# Patient Record
Sex: Male | Born: 1945
Health system: Southern US, Community
[De-identification: ages and names within clinical notes are randomized; demographics above are authoritative.]

## PROBLEM LIST (undated history)

## (undated) DIAGNOSIS — C801 Malignant (primary) neoplasm, unspecified: Secondary | ICD-10-CM

## (undated) DIAGNOSIS — T7840XA Allergy, unspecified, initial encounter: Secondary | ICD-10-CM

## (undated) DIAGNOSIS — R0600 Dyspnea, unspecified: Secondary | ICD-10-CM

## (undated) DIAGNOSIS — E119 Type 2 diabetes mellitus without complications: Secondary | ICD-10-CM

## (undated) DIAGNOSIS — I6529 Occlusion and stenosis of unspecified carotid artery: Secondary | ICD-10-CM

## (undated) DIAGNOSIS — J189 Pneumonia, unspecified organism: Secondary | ICD-10-CM

## (undated) DIAGNOSIS — Z5189 Encounter for other specified aftercare: Secondary | ICD-10-CM

## (undated) DIAGNOSIS — J45909 Unspecified asthma, uncomplicated: Secondary | ICD-10-CM

## (undated) DIAGNOSIS — IMO0002 Reserved for concepts with insufficient information to code with codable children: Secondary | ICD-10-CM

## (undated) DIAGNOSIS — I10 Essential (primary) hypertension: Secondary | ICD-10-CM

## (undated) DIAGNOSIS — E785 Hyperlipidemia, unspecified: Secondary | ICD-10-CM

## (undated) DIAGNOSIS — E66813 Obesity, class 3: Secondary | ICD-10-CM

## (undated) DIAGNOSIS — M1990 Unspecified osteoarthritis, unspecified site: Secondary | ICD-10-CM

## (undated) HISTORY — DX: Encounter for other specified aftercare: Z51.89

## (undated) HISTORY — DX: Hyperlipidemia, unspecified: E78.5

## (undated) HISTORY — PX: WRIST SURGERY: SHX841

## (undated) HISTORY — DX: Unspecified osteoarthritis, unspecified site: M19.90

## (undated) HISTORY — PX: TOTAL KNEE ARTHROPLASTY: SHX125

## (undated) HISTORY — PX: RIB FRACTURE SURGERY: SHX2358

## (undated) HISTORY — DX: Obesity, class 3: E66.813

## (undated) HISTORY — PX: KNEE SURGERY: SHX244

## (undated) HISTORY — DX: Allergy, unspecified, initial encounter: T78.40XA

## (undated) HISTORY — DX: Type 2 diabetes mellitus without complications: E11.9

## (undated) HISTORY — DX: Reserved for concepts with insufficient information to code with codable children: IMO0002

## (undated) HISTORY — PX: COLON SURGERY: SHX602

## (undated) HISTORY — PX: COLONOSCOPY: SHX174

## (undated) HISTORY — DX: Essential (primary) hypertension: I10

## (undated) HISTORY — DX: Unspecified asthma, uncomplicated: J45.909

## (undated) HISTORY — DX: Occlusion and stenosis of unspecified carotid artery: I65.29

## (undated) HISTORY — DX: Morbid (severe) obesity due to excess calories: E66.01

---

## 1998-11-15 HISTORY — PX: OTHER SURGICAL HISTORY: SHX169

## 1998-11-26 ENCOUNTER — Ambulatory Visit (HOSPITAL_COMMUNITY): Admission: RE | Admit: 1998-11-26 | Discharge: 1998-11-26 | Payer: Self-pay | Admitting: Gastroenterology

## 1999-06-18 ENCOUNTER — Encounter: Payer: Self-pay | Admitting: Surgery

## 1999-06-18 ENCOUNTER — Inpatient Hospital Stay (HOSPITAL_COMMUNITY): Admission: EM | Admit: 1999-06-18 | Discharge: 1999-07-04 | Payer: Self-pay

## 1999-06-20 ENCOUNTER — Encounter: Payer: Self-pay | Admitting: General Surgery

## 1999-06-21 ENCOUNTER — Encounter: Payer: Self-pay | Admitting: General Surgery

## 1999-06-22 ENCOUNTER — Encounter: Payer: Self-pay | Admitting: General Surgery

## 1999-06-22 ENCOUNTER — Encounter: Payer: Self-pay | Admitting: Thoracic Surgery

## 1999-06-23 ENCOUNTER — Encounter: Payer: Self-pay | Admitting: General Surgery

## 1999-06-24 ENCOUNTER — Encounter: Payer: Self-pay | Admitting: General Surgery

## 1999-06-25 ENCOUNTER — Encounter: Payer: Self-pay | Admitting: Thoracic Surgery

## 1999-06-26 ENCOUNTER — Encounter: Payer: Self-pay | Admitting: Thoracic Surgery

## 1999-06-29 ENCOUNTER — Encounter: Payer: Self-pay | Admitting: Thoracic Surgery

## 1999-06-30 ENCOUNTER — Encounter: Payer: Self-pay | Admitting: Thoracic Surgery

## 1999-07-01 ENCOUNTER — Encounter: Payer: Self-pay | Admitting: Thoracic Surgery

## 1999-08-10 ENCOUNTER — Encounter: Payer: Self-pay | Admitting: Orthopedic Surgery

## 1999-08-12 ENCOUNTER — Inpatient Hospital Stay (HOSPITAL_COMMUNITY): Admission: RE | Admit: 1999-08-12 | Discharge: 1999-08-14 | Payer: Self-pay | Admitting: Orthopedic Surgery

## 1999-08-13 ENCOUNTER — Encounter: Payer: Self-pay | Admitting: Orthopedic Surgery

## 1999-09-14 ENCOUNTER — Encounter: Payer: Self-pay | Admitting: Cardiothoracic Surgery

## 1999-09-14 ENCOUNTER — Encounter: Admission: RE | Admit: 1999-09-14 | Discharge: 1999-09-14 | Payer: Self-pay | Admitting: Cardiothoracic Surgery

## 1999-12-08 ENCOUNTER — Encounter: Payer: Self-pay | Admitting: Thoracic Surgery

## 1999-12-08 ENCOUNTER — Encounter: Admission: RE | Admit: 1999-12-08 | Discharge: 1999-12-08 | Payer: Self-pay | Admitting: Thoracic Surgery

## 2000-01-18 ENCOUNTER — Encounter: Payer: Self-pay | Admitting: Orthopedic Surgery

## 2000-01-20 ENCOUNTER — Encounter: Payer: Self-pay | Admitting: Orthopedic Surgery

## 2000-01-20 ENCOUNTER — Inpatient Hospital Stay (HOSPITAL_COMMUNITY): Admission: RE | Admit: 2000-01-20 | Discharge: 2000-01-22 | Payer: Self-pay | Admitting: Orthopedic Surgery

## 2000-04-08 ENCOUNTER — Encounter: Payer: Self-pay | Admitting: Thoracic Surgery

## 2000-04-08 ENCOUNTER — Encounter: Admission: RE | Admit: 2000-04-08 | Discharge: 2000-04-08 | Payer: Self-pay | Admitting: Thoracic Surgery

## 2000-05-12 ENCOUNTER — Ambulatory Visit (HOSPITAL_COMMUNITY): Admission: RE | Admit: 2000-05-12 | Discharge: 2000-05-12 | Payer: Self-pay | Admitting: Orthopedic Surgery

## 2000-05-12 ENCOUNTER — Encounter: Payer: Self-pay | Admitting: Orthopedic Surgery

## 2000-05-17 ENCOUNTER — Ambulatory Visit (HOSPITAL_COMMUNITY): Admission: RE | Admit: 2000-05-17 | Discharge: 2000-05-17 | Payer: Self-pay | Admitting: Orthopedic Surgery

## 2000-05-17 ENCOUNTER — Encounter: Payer: Self-pay | Admitting: Orthopedic Surgery

## 2000-05-17 ENCOUNTER — Encounter (INDEPENDENT_AMBULATORY_CARE_PROVIDER_SITE_OTHER): Payer: Self-pay | Admitting: *Deleted

## 2000-08-04 ENCOUNTER — Encounter: Payer: Self-pay | Admitting: Thoracic Surgery

## 2000-08-04 ENCOUNTER — Encounter: Admission: RE | Admit: 2000-08-04 | Discharge: 2000-08-04 | Payer: Self-pay | Admitting: Thoracic Surgery

## 2000-08-15 HISTORY — PX: OTHER SURGICAL HISTORY: SHX169

## 2001-03-03 ENCOUNTER — Encounter: Payer: Self-pay | Admitting: Thoracic Surgery

## 2001-03-03 ENCOUNTER — Encounter: Admission: RE | Admit: 2001-03-03 | Discharge: 2001-03-03 | Payer: Self-pay | Admitting: Thoracic Surgery

## 2001-07-20 ENCOUNTER — Ambulatory Visit (HOSPITAL_COMMUNITY): Admission: RE | Admit: 2001-07-20 | Discharge: 2001-07-20 | Payer: Self-pay | Admitting: Orthopedic Surgery

## 2001-07-20 ENCOUNTER — Encounter: Payer: Self-pay | Admitting: Orthopedic Surgery

## 2001-07-26 ENCOUNTER — Encounter: Payer: Self-pay | Admitting: Orthopedic Surgery

## 2001-07-26 ENCOUNTER — Inpatient Hospital Stay (HOSPITAL_COMMUNITY): Admission: RE | Admit: 2001-07-26 | Discharge: 2001-07-28 | Payer: Self-pay | Admitting: Orthopedic Surgery

## 2001-08-30 ENCOUNTER — Encounter: Payer: Self-pay | Admitting: Thoracic Surgery

## 2001-08-30 ENCOUNTER — Encounter: Admission: RE | Admit: 2001-08-30 | Discharge: 2001-08-30 | Payer: Self-pay | Admitting: Thoracic Surgery

## 2002-01-24 ENCOUNTER — Encounter: Admission: RE | Admit: 2002-01-24 | Discharge: 2002-01-24 | Payer: Self-pay | Admitting: Thoracic Surgery

## 2002-01-24 ENCOUNTER — Encounter: Payer: Self-pay | Admitting: Thoracic Surgery

## 2002-02-06 ENCOUNTER — Ambulatory Visit (HOSPITAL_COMMUNITY): Admission: RE | Admit: 2002-02-06 | Discharge: 2002-02-06 | Payer: Self-pay | Admitting: Orthopedic Surgery

## 2002-08-29 ENCOUNTER — Encounter: Payer: Self-pay | Admitting: Thoracic Surgery

## 2002-08-29 ENCOUNTER — Encounter: Admission: RE | Admit: 2002-08-29 | Discharge: 2002-08-29 | Payer: Self-pay | Admitting: Thoracic Surgery

## 2003-02-28 ENCOUNTER — Encounter: Payer: Self-pay | Admitting: Thoracic Surgery

## 2003-02-28 ENCOUNTER — Encounter: Admission: RE | Admit: 2003-02-28 | Discharge: 2003-02-28 | Payer: Self-pay | Admitting: Thoracic Surgery

## 2003-05-16 ENCOUNTER — Emergency Department (HOSPITAL_COMMUNITY): Admission: EM | Admit: 2003-05-16 | Discharge: 2003-05-16 | Payer: Self-pay | Admitting: Emergency Medicine

## 2003-05-16 ENCOUNTER — Encounter: Payer: Self-pay | Admitting: Emergency Medicine

## 2003-08-27 ENCOUNTER — Encounter: Admission: RE | Admit: 2003-08-27 | Discharge: 2003-08-27 | Payer: Self-pay | Admitting: Thoracic Surgery

## 2003-08-27 ENCOUNTER — Encounter: Payer: Self-pay | Admitting: Thoracic Surgery

## 2004-02-25 ENCOUNTER — Encounter: Admission: RE | Admit: 2004-02-25 | Discharge: 2004-02-25 | Payer: Self-pay | Admitting: Thoracic Surgery

## 2004-04-01 ENCOUNTER — Ambulatory Visit (HOSPITAL_COMMUNITY): Admission: RE | Admit: 2004-04-01 | Discharge: 2004-04-01 | Payer: Self-pay | Admitting: Gastroenterology

## 2005-03-03 ENCOUNTER — Encounter: Admission: RE | Admit: 2005-03-03 | Discharge: 2005-03-03 | Payer: Self-pay | Admitting: Thoracic Surgery

## 2005-06-03 ENCOUNTER — Emergency Department (HOSPITAL_COMMUNITY): Admission: EM | Admit: 2005-06-03 | Discharge: 2005-06-03 | Payer: Self-pay | Admitting: Emergency Medicine

## 2005-08-31 ENCOUNTER — Ambulatory Visit (HOSPITAL_COMMUNITY): Admission: RE | Admit: 2005-08-31 | Discharge: 2005-08-31 | Payer: Self-pay | Admitting: Orthopedic Surgery

## 2005-09-07 ENCOUNTER — Observation Stay (HOSPITAL_COMMUNITY): Admission: AD | Admit: 2005-09-07 | Discharge: 2005-09-08 | Payer: Self-pay | Admitting: Orthopedic Surgery

## 2005-11-05 ENCOUNTER — Encounter: Admission: RE | Admit: 2005-11-05 | Discharge: 2005-11-05 | Payer: Self-pay | Admitting: Orthopedic Surgery

## 2005-12-17 ENCOUNTER — Encounter: Admission: RE | Admit: 2005-12-17 | Discharge: 2005-12-17 | Payer: Self-pay | Admitting: Orthopedic Surgery

## 2006-01-05 ENCOUNTER — Encounter: Admission: RE | Admit: 2006-01-05 | Discharge: 2006-01-05 | Payer: Self-pay | Admitting: Orthopedic Surgery

## 2006-02-02 ENCOUNTER — Encounter: Admission: RE | Admit: 2006-02-02 | Discharge: 2006-02-02 | Payer: Self-pay | Admitting: Orthopedic Surgery

## 2006-02-22 ENCOUNTER — Encounter: Admission: RE | Admit: 2006-02-22 | Discharge: 2006-02-22 | Payer: Self-pay | Admitting: Thoracic Surgery

## 2006-03-16 ENCOUNTER — Encounter: Admission: RE | Admit: 2006-03-16 | Discharge: 2006-03-16 | Payer: Self-pay | Admitting: Orthopedic Surgery

## 2006-04-15 ENCOUNTER — Encounter: Admission: RE | Admit: 2006-04-15 | Discharge: 2006-04-15 | Payer: Self-pay | Admitting: Orthopedic Surgery

## 2006-05-19 ENCOUNTER — Observation Stay (HOSPITAL_COMMUNITY): Admission: RE | Admit: 2006-05-19 | Discharge: 2006-05-21 | Payer: Self-pay | Admitting: Orthopedic Surgery

## 2006-11-18 ENCOUNTER — Encounter: Admission: RE | Admit: 2006-11-18 | Discharge: 2006-11-18 | Payer: Self-pay | Admitting: Orthopedic Surgery

## 2007-03-10 ENCOUNTER — Encounter: Admission: RE | Admit: 2007-03-10 | Discharge: 2007-03-10 | Payer: Self-pay | Admitting: Orthopedic Surgery

## 2007-11-13 ENCOUNTER — Inpatient Hospital Stay (HOSPITAL_COMMUNITY): Admission: RE | Admit: 2007-11-13 | Discharge: 2007-11-16 | Payer: Self-pay | Admitting: Orthopedic Surgery

## 2010-09-07 HISTORY — PX: TRANSTHORACIC ECHOCARDIOGRAM: SHX275

## 2010-12-08 LAB — HEPATIC FUNCTION PANEL
Indirect Bilirubin: 0.5 mg/dL (ref 0.3–0.9)
Total Protein: 6.8 g/dL (ref 6.0–8.3)

## 2010-12-08 LAB — BASIC METABOLIC PANEL
BUN: 12 mg/dL (ref 6–23)
CO2: 26 mEq/L (ref 19–32)
GFR calc Af Amer: >60 mL/min (ref >60)
Glucose, Bld: 112 mg/dL — ABNORMAL HIGH (ref 70–99)
Potassium: 4.8 mEq/L (ref 3.5–5.1)
Sodium: 142 mEq/L (ref 135–145)

## 2010-12-08 LAB — HEMOGLOBIN A1C: Hgb A1c MFr Bld: 6.3 % — ABNORMAL HIGH (ref <5.7)

## 2010-12-08 LAB — LIPID PANEL: Total CHOL/HDL Ratio: 4.1 RATIO

## 2010-12-09 ENCOUNTER — Ambulatory Visit
Admission: RE | Admit: 2010-12-09 | Discharge: 2010-12-09 | Payer: Self-pay | Source: Home / Self Care | Attending: Surgery | Admitting: Surgery

## 2010-12-11 NOTE — Op Note (Signed)
  NAMEKUNAL, [name]             ACCOUNT NO.:  [id number]  MEDICAL RECORD NO.:  [id number]          PATIENT TYPE:  AMB  LOCATION:  DSC                          FACILITY:  [facility]  PHYSICIAN:  Thornton Park. Daphine Deutscher, MD  DATE OF BIRTH:  August 13, 1946  DATE OF PROCEDURE:  12/09/2010 DATE OF DISCHARGE:                              OPERATIVE REPORT   PREOPERATIVE DIAGNOSIS:  A 65 year old African American male with a thrice recurrent pilonidal cyst/abscess.  PROCEDURE:  Pilonidal cystectomy with primary closure.  SURGEON:  Thornton Park. Daphine Deutscher, MD  ANESTHESIA:  General endotracheal in the prone position.  DESCRIPTION OF PROCEDURE:  Mr. Glinski was taken to room 3 at [facility] on December 09, 2010 and given general anesthesia and placed in prone position.  His buttock cheeks were pulled apart.  There was previous punctum that I could see in the midline.  When I expressed the left side, I could get some cloudy fluid coming up through that punctum. I excised the punctum with an ellipse that included the area on the right side, so it was somewhat of an eccentrically oriented ellipse, but I went down deep and took this down of ultimately about an inch and half deep removing all the grossly involved tissue.  I looked for foreign bodies and did not really see any, but I saw a lot of granulation tissue along this pilonidal sinus.  Once this was removed, bleeding was controlled with electrocautery.  I closed it in layers with deep layers of 2-0 Vicryl, working my way up to near the subcutaneous tissue and then closed the skin with vertical mattress sutures of 3-0 Prolene. These will stay in for probably 2 weeks.  The area was then injected with 10 mL of 0.25% Marcaine with epinephrine and was painted with Betadine ointment.  I also irrigated the cavity with Betadine and saline prior to closing it.  The patient seemed to tolerate the procedure well. He will be given some Roxicet elixir or  Roxicet pills to take for pain and he will be instructed to return to the office in about 2 weeks.     Thornton Park Daphine Deutscher, MD     MBM/MEDQ  D:  12/09/2010  T:  12/10/2010  Job:  [id number]  cc:   Brett Canales A. Cleta Alberts, M.D.  Electronically Signed by Luretha Murphy MD on 12/11/2010 08:30:53 PM

## 2011-03-30 NOTE — Op Note (Signed)
NAMEJAISON, PETRAGLIA             ACCOUNT NO.:  [id number]   MEDICAL RECORD NO.:  [id number]          PATIENT TYPE:  INP   LOCATION:  1619                         FACILITY:  [facility]   PHYSICIAN:  Madlyn Frankel. Charlann Boxer, M.D.  DATE OF BIRTH:  1945-11-23   DATE OF PROCEDURE:  11/13/2007  DATE OF DISCHARGE:                               OPERATIVE REPORT   PREOPERATIVE DIAGNOSIS:  Left knee end-stage osteoarthritis.   POSTOPERATIVE DIAGNOSIS:  Left knee end-stage osteoarthritis.   PROCEDURE:  Left total knee replacement.   COMPONENTS USED:  DePuy rotating platform knee system with a size 4  femur, a 4 tibia, a 10 mm insert, and a 41 patellar button.   SURGEON:  Madlyn Frankel. Charlann Boxer, M.D.   ASSISTANT:  Yetta Glassman. Loreta Ave, PA   ANESTHESIA:  General   COMPLICATIONS:  None.   DRAINS:  Times one.   BLOOD LOSS:  150 mL.   TOURNIQUET TIME:  60 minutes at 250 mmHg.   INDICATIONS FOR PROCEDURE:  The Mr. Brining is a 65 year old gentleman  who presented to the office for evaluation of left knee pain.  Radiographs revealed end-stage degenerative changes.  He failed  conservative measures and wished to discuss surgical intervention.  Risks and benefits were discussed including risk of infection, DVT, the  postop course expectations, consent was obtained for a left total knee  replacement.   PROCEDURE IN DETAIL:  The patient was brought to operative theater.  Once adequate anesthesia preoperative antibiotics, Ancef, were  administered; the patient was positioned supine with a left proximal  thigh tourniquet placed.  The left lower extremity was prescrubbed, then  prepped and draped in sterile fashion.  A midline incision was made  followed by a median parapatellar arthrotomy with patella subluxation  rather than eversion.   Following the initial debridement, attention was directed the patella.  Precut measurement was all way up to 28 mm.  I dissected down to 15 mm  and used the 41 patellar button.   A metal shim was placed to protect the  cut surface of the patella from retractors.  At this point, attention  was directed to  the femur.  The femoral canal was opened with a reamer,  and irrigated to prevent fat emboli.  Intramedullary rod was then  passed, and at 5 degrees valgus 10 mm of bone was resected off the  distal femur.   I then sized the femur to be a size 4, the anterior, posterior, and  chamfer cuts were then made without difficulty or complication.  I then  made the trochlear box cut based off the lateral aspect of the distal  femur without issue.   Attention was now directed to the tibia.  With tibial subluxation,  further meniscectomies and was cruciate stumps debrided.  Then 10 mm of  bone resected off the proximal tibia based off the lateral side using  the stylus.  An extramedullary guide positioned in the center portion of  the leg with bony landmarks identified and palpated.   After this cut the knee was brought into extension with an extension  block,  and I was happy to note that the knee came out in extension.  I  did check and find that my cut was in a slight bit of varus still, so I  ended up cutting a little bit more bone off the lateral proximal tibia  and rechecked with the alignment rod and happier that the knee was now  more straight.   I finished off the final preparation of the tibia removing osteophytes  on the medial side of the tibia using a size 4 and lateralizing it.  I  drilled a keel punch, and did a trial reduction with a 4 femur, 4 tibia,  and a 10-mm insert.  The knee came out to full extension.  The patella  tracked without application pressure.  At this point, all trial  components were removed.  I drilled the sclerotic bone off the proximal  medial tibia, using several drill holes.  I then irrigated the wound,  cleaned it out as necessary.  I injected the synovial capsule layer was  60 mL of 1/4% Marcaine and epinephrine in 1 mL of  Toradol.  Final cement  was mixed, as components opened.  These components were then cemented  into position, and the knee brought into extension to allow the cement  to cure.   Once the cement had cured, excessive cement was debrided throughout the  knee.  There were no visualized cement fragments throughout.  At this  point the knee was irrigated, again, 5 mL of FloSeal was injected into  the posterior aspect of the knee.  Tourniquet was let down prior to  this.  The final 10-mm insert was in place without difficulty and the  knee reduced.  The knee was then brought into flexion, and with a  Hemovac drain placed deep.  The extensor mechanism was reapproximated  using #1 Vicryl.  The remaining wound was closed with 2-0 Vicryls, and a  running 4-0 Monocryl.  The patient tolerated the procedure well and was  brought to recovery room and extubated in stable condition with a  sterile bulky dressing on his knee.      Madlyn Frankel Charlann Boxer, M.D.  Electronically Signed     MDO/MEDQ  D:  11/13/2007  T:  11/14/2007  Job:  [id number]
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
## 2011-04-02 NOTE — Op Note (Signed)
[place]. [facility]  Patient:    [name], [name]                    MRN: [id number] Proc. Date: 05/17/00 Adm. Date:  [id number] Disc. Date: [id number] Attending:  Wende Mott                           Operative Report  PREOPERATIVE DIAGNOSIS:  Nonunion, right humerus.  Rule out osteomyelitis. Rule out avascular necrosis, left humerus.  POSTOPERATIVE DIAGNOSIS:  Nonunion, left humerus.  Rule out osteomyelitis. Rule out aseptic necrosis.  ANESTHESIA:  General.  OPERATION:  Craig needle biopsy to the left humerus and culture and sensitivity and Grams stain, left humerus.  DESCRIPTION OF PROCEDURE:  The patient was taken to the operating room after given adequate preop medication and given general anesthesia, and intubated. The left humeral area was scrubbed with Betadine scrub and painted with Betadine solution and draped in a sterile manner.  C-arm was used to visualize placement of the nonunion.  An incision was made over the lateral aspect of the humerus.  Sharp and blunt dissection was made down to the nonunion site. Craig needle was introduced and bony as well as soft tissue material was removed.  From the nonunion site, the bone itself appeared to be much harder than what appeared to be on x-ray even at the nonunion site.  The tissue was sent for culture and sensitivity and Grams stain was done as well as biopsy of the bony material for possible osteomyelitis or avascular bone.  After adequate tissue was obtained, irrigation with antibiotic solution was done. Wound closure was done with 4-0 nylon and a compressive dressings applied. The patient tolerated the procedure quite well and went to the recovery room in stable and satisfactory condition.  The patient is being discharged on Percocet one q.4h. p.r.n. for pain. Continue using his sling and ice packs at home and to return to the office in one week. DD:  06/10/00 TD:  06/12/00 Job:  [id number]

## 2011-04-02 NOTE — Op Note (Signed)
[place]  Patient:    [name], [name] Visit Number: [id number] MRN: [id number]          Service Type: SUR Location: [id number] Attending Physician:  Wende Mott Dictated by:   Kennieth Rad, M.D. Proc. Date: 07/26/01 Admit Date:  07/26/2001 Discharge Date: 07/28/2001                             Operative Report  PREOPERATIVE DIAGNOSIS:  Nonunion of left humerus.  POSTOPERATIVE DIAGNOSIS:  Nonunion of left humerus.  PROCEDURES:  Bone graft of the left iliac crest to left humerus and internal bone stimulator, EBI.  ANESTHESIA:  General.  SURGEON:  Kennieth Rad, M.D.  DESCRIPTION OF PROCEDURE:  The patient was taken to the operating room after being given adequate preoperative medications.  He was given general anesthesia and intubated.  The left upper extremity was prepped with DuraPrep and draped in sterile manner.  An anterolateral incision was made over the left humerus and mid-shaft area, avoiding the previous scar, going through the skin and subcutaneous tissues down to the biceps muscle tissue down to the nonunion site.  The plate on the humerus was found to be good and stable. Inspection of the fracture site revealed only a small section of the humerus that had not united with obvious defect.  Bone had formed laterally, posteriorly, and mostly medially.  There was an anteromedial section of the humerus which had not united.  This was curetted with the curet with removal of all of the fibrous tissue from the area.  After adequate debridement of the area, then an incision was made over the left iliac crest going through the skin and subcutaneous tissues.  Soft tissue was subperiosteally elevated from the iliac crest.  With the use of oscillating saw, bone graft was obtained with cancellus and cortical bone.  A bone plug was created.  Next an internal bone stimulator was packed into the nonunion site followed by  placement of the bone graft on top of it with the bone plug.  The electrical stimulator itself was placed superiorly underneath the subcutaneous tissue over the deltoid where he had an old previous anterior shoulder scar.  This was then sutured in place with the use of 0 Vicryl between the fascia of the muscle and the subcutaneous tissue.  After impaction of the bone graft, it was found to be good and stable.  Wound closure was then done with 0 Vicryl for the fascia, 0 Vicryl for the subcutaneous, and skin staples for the skin.  The left iliac crest wound also was closed with 0 Vicryl and 2-0 Vicryl for the subcutaneous and skin staples.  A compressive dressing was applied to both areas.  The patient tolerated the procedure quite well and went to the recovery room in stable and satisfactory condition. Dictated by:   Kennieth Rad, M.D. Attending Physician:  Wende Mott DD:  08/23/01 TD:  08/23/01 Job: [id number]
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
## 2011-04-02 NOTE — Discharge Summary (Signed)
NAMEAKIVA, BRASSFIELD             ACCOUNT NO.:  [id number]   MEDICAL RECORD NO.:  [id number]          PATIENT TYPE:  INP   LOCATION:  1619                         FACILITY:  [facility]   PHYSICIAN:  Madlyn Frankel. Charlann Boxer, M.D.  DATE OF BIRTH:  Mar 10, 1946   DATE OF ADMISSION:  11/13/2007  DATE OF DISCHARGE:  11/16/2007                               DISCHARGE SUMMARY   ADMITTING DIAGNOSIS:  1. Osteoarthritis.  2. Asthma.  3. Hypertension.   DISCHARGE DIAGNOSIS:  1. Osteoarthritis.  2. Asthma.  3. Hypertension.   HISTORY OF PRESENT ILLNESS:  A 65 year old male with a history of left  knee pain secondary to osteoarthritis.  Refractory to all conservative  treatment.   CONSULTATION:  None.   PROCEDURE:  Left total knee arthroplasty by surgeon Dr. Durene Romans,  assistant Dwyane Luo PA-C.   LABS:  Preadmission CBC, hematocrit was 46.1, at discharge stable at 30.  White cell differential no significant abnormalities.  Coags, INR was  0.9.  Routine chemistry preop,  sodium 141, potassium 4.7, glucose 104.  At discharge, sodium 138, potassium 4.3, glucose 127.  Kidney function,  GFR greater than 68 at admission, calcium 9.7. At time of discharge, GFR  greater than 60 and calcium 8.2.  GI showed his AST at 30, all others  normal.  UA was negative.   RADIOLOGY:  Chest two-view no active disease.   CARDIOLOGY:  EKG normal sinus rhythm.   HOSPITAL COURSE:  The patient underwent left total knee replacement,  admitted to the orthopedic floor.  He progressed nicely during his  course of stay and the stay was unremarkable.  He remained afebrile.  He  did have a drop in his blood although it was stable at the time of  discharge.  Dressing was changed on a daily basis after postop day #1.  The wound had no significant drainage at any time. He was  neurovascularly intact to his left lower extremity throughout and was  able to do a straight leg raise as of day one and continued to be so  throughout  his course.  His calves remained soft and nontender.  He was  weightbearing as tolerated with the use of a rolling walker.  We  discontinued a PCA, hep-locked his IV. On the second day, he ambulated  300 feet and by day three he was stable and was ready for home health  care PT.   DISCHARGE DISPOSITION:  Discharged home with home health care PT and  Lovenox teaching in stable and improved condition.   DISCHARGE PHYSICAL THERAPY:  The goals of physical therapy will be  weightbearing as tolerated with the use of a rolling walker.  Range of  motion goals 0-100 at 2 weeks, 1-120 at 6 weeks. Want to work on quad  strengthening and stretching exercise.   DISCHARGE MEDICATIONS:  1. Vicodin 5/325 1-2 p.o. q.4-6 p.r.n. pain.  2. Lovenox 40 mg subcu q.24 x11 days.  3. Robaxin 500 mg p.o. q.6.  4. Iron 325 mg p.o. t.i.d. x2 weeks.  5. Colace 100 mg p.o. b.i.d.  6. MiraLax 17 grams p.o.  daily.   HOME MEDICATIONS:  1. Bisoprolol HCTZ 10/6.25 one p.o. daily.  2. Multivitamin one p.o. daily.  3. Garlique one p.o. daily.  4. Proventil inhaler 1 puff p.r.n.  5. Celebrex 200 mg one p.o. b.i.d. x2 weeks.   DISCHARGE FOLLOWUP:  Follow up with Dr. Charlann Boxer at phone number [phone] in  2 weeks.   DISCHARGE DIET:  Regular as tolerated.   DISCHARGE WOUND CARE:  Keep wound dry. Change dressing on a daily basis.     ______________________________  Yetta Glassman [address], [place]      Madlyn Frankel. Charlann Boxer, M.D.  Electronically Signed    BLM/MEDQ  D:  12/14/2007  T:  12/14/2007  Job:  [id number]   cc:   Jonita Albee, M.D.  Fax: [phone]   Stan Head. Cleta Alberts, M.D.  Fax: [phone]

## 2011-04-02 NOTE — Op Note (Signed)
NAMEARJUN, HARD NO.:  [id number]   MEDICAL RECORD NO.:  [id number]          PATIENT TYPE:  INP   LOCATION:  5157                         FACILITY:  [facility]   PHYSICIAN:  Myrtie Neither, MD      DATE OF BIRTH:  06-18-1946   DATE OF PROCEDURE:  DATE OF DISCHARGE:  09/08/2005                                 OPERATIVE REPORT   PREOPERATIVE DIAGNOSES:  1.  Refracture, left humerus.  2.  Broken hardware, left humerus.   POSTOPERATIVE DIAGNOSES:  1.  Refracture, left humerus.  2.  Broken hardware, left humerus.   OPERATION PERFORMED:  1.  Removal of hardware, left humerus.  2.  Open reduction and internal fixation, left humerus, with the use of      surfacing bone grafting.   SURGEON:  Myrtie Neither, M.D.   ANESTHESIA:  General.   DESCRIPTION OF THE OPERATION:  The patient was brought to the operating room  after he was given adequate premedication he was given general anesthesia  and intubated.  The patient was placed in the barber chair position.  The  left upper extremity was prepped with DuraPrep and draped in a sterile  manner.  Bovie was used for hemostasis.   A longitudinal incision laterally was made going through an old previous  scar, going through the skin and subcutaneous tissue down to the fascia.  The soft tissue was excised sharp and bluntly, and dissected from the  humerus.  I was getting down to the fracture site.  The previous plate and  screws were completely incorporated with bone.  Disheveled bone was removed  with the use of an osteotome.  The screws were removed; there was two  proximal screws, but the distal one left in.  After removal of the screws  and plate the ends at the fracture site were __________  and roughened up,  and reaming down the canal both ends of the fracture site .  Next the cement  for the bone grafting was mixed and bone plug was placed into both ends of  the fracture ends.   Both proximal and distal ends were  approximated with the use of a  compression plate and screws.  Multiple screws were placed across it holding  it in a stable anatomic position.  Further bone graft was then placed around  the exterior of the bone at the fracture site.  Hemostasis was obtained with  the use of the Bovie.  Closure was then done with 0-Vicryl for the fascia, 2-  0 for the subcutaneous  and skin staples for the skin.   ESTIMATED BLOOD LOSS:  Approximately 700 mL of blood was lost.   A compressive dressing was applied.  A Jones splint was applied.   The patient tolerated the procedure quite well and went to the recovery room  in stable and satisfactory condition.      Myrtie Neither, MD  Electronically Signed     AC/MEDQ  D:  10/15/2005  T:  10/17/2005  Job:  [id number]
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
## 2011-04-02 NOTE — Op Note (Signed)
[name]. [facility]  Patient:    [name], [name] Visit Number: [id number] MRN: [id number]          Service Type: DSU Location: [facility] 2899 29 Attending Physician:  Wende Mott Dictated by:   Kennieth Rad, M.D. Proc. Date: 02/06/02 Admit Date:  02/06/2002 Discharge Date: 02/06/2002                             Operative Report  PREOPERATIVE DIAGNOSIS:  Electrode to internal electrical bone stimulation, left arm.  POSTOPERATIVE DIAGNOSIS:  Electrode to internal electrical bone stimulation, left arm.  PROCEDURES:  Removal of electrode, left arm.  SURGEON:  Kennieth Rad, M.D.  ANESTHESIA:  General.  DESCRIPTION OF PROCEDURE:  The patient was taken to the operating room after being given adequate preoperative medications.  Given general anesthesia and intubated.  The left arm was prepped with DuraPrep and draped in a sterile manner.  A skin incision was made over the old previous scar, which is where the electrode was placed.  The electrode was identified and completely removed from the fracture site.  Copious irrigation was done.  Wound closure was then done with skin staples.  A pressure dressing was applied.  The patient tolerated the procedure quite well and went to the recovery room in stable and satisfactory condition.  The patient is being discharged home on Percocet one q.4h. p.r.n. for pain, ice packs, and elevation.  He is to return to the office in one week. Dictated by:   Kennieth Rad, M.D. Attending Physician:  Wende Mott DD:  03/14/02 TD:  03/14/02 Job: [id number]

## 2011-04-02 NOTE — H&P (Signed)
[name]. [facility]  Patient:    [name], RUDNICK Visit Number: [id number] MRN: [id number]          Service Type: SUR Location: 5000 5021 01 Attending Physician:  Wende Mott Dictated by:   Kennieth Rad, M.D. Admit Date:  07/26/2001 Discharge Date: 07/28/2001                           History and Physical  CHIEF COMPLAINT:  Nonunion of left humerus.  HISTORY OF PRESENT ILLNESS:  This is a 65 year old male who had undergone ORIF of severely open wound involving his left humerus.  The patient has developed nonunion.  The patient had a CT scan demonstrating the section of the humerus which has not united.  The patient had been undergoing external stone stimulation with no improvement in the last few months.  PAST MEDICAL HISTORY:  High blood pressure, asthma, stomach ulcers, hepatitis, multiple operations of the left humerus and left hand for post-traumatic injury, and left knee surgery in 1998.  ALLERGIES:  None.  HABITS:  None.  MEDICATIONS:  Fosamax, Ultracet, Vioxx, Flonase, multivitamins, calcium with vitamin D, inhaler, Bi-Flex over-the-counter, and Ziac 5/6.25 mg.  FAMILY HISTORY:  Noncontributory.  REVIEW OF SYSTEMS:  Some left knee pain.  No cardiac, respiratory, urinary, or bowel symptoms.  PHYSICAL EXAMINATION:  Temperature 97.8 degrees, pulse 68, respirations 16, blood pressure 110/70.  HEIGHT:  68 inches.  WEIGHT:  247 pounds.  HEENT:  Normocephalic and atraumatic.  Eyes:  Conjunctivae and sclerae clear.  NECK:  Supple.  CHEST:  Clear.  CARDIAC:  S1 and S2 regular.  EXTREMITIES:  The left arm has an old surgical scar and post-traumatic scar with loss of muscle mass involving the left biceps and triceps.  Limited range of motion of the left elbow, as well as that of the left hand and wrist.  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  IMPRESSION:   Nonunion of left humerus. Dictated by:   Kennieth Rad, M.D. Attending Physician:  Wende Mott DD:  08/23/01 TD:  08/23/01 Job: [id number]

## 2011-04-02 NOTE — Op Note (Signed)
[name], STANEK NO.:  [id number]   MEDICAL RECORD NO.:  [id number]          PATIENT TYPE:  INP   LOCATION:  5157                         FACILITY:  [facility]   PHYSICIAN:  Myrtie Neither, MD      DATE OF BIRTH:  Oct 04, 1946   DATE OF PROCEDURE:  09/23/2005  DATE OF DISCHARGE:  09/08/2005                                 OPERATIVE REPORT   PREOPERATIVE DIAGNOSIS:  Refracture of left humerus with broken hardware,  screw and plates, left humerus.   POSTOPERATIVE DIAGNOSIS:  Refracture of left humerus with broken hardware,  screw and plates, left humerus.   ANESTHESIA:  General.   PROCEDURE:  Removal of hardware left humerus, ORIF left humerus, with use of  Symphony bone grafting to the left humerus.   DESCRIPTION OF PROCEDURE:  The patient was taken to the operating room and  after given adequate preoperative medication, given general anesthesia and  intubated.  The patient was placed in barber chair position.  The left upper  extremity was prepped with DuraPrep and draped in a sterile manner.  Bovie  used for hemostasis.  C-Arm was used to visualize fracture reduction.  Lateral incision was made over the left humerus going through the old  previous surgical scar.  Sharp and blunt dissection was made down to the  humerus.  Soft tissues were subperiosteally elevated from the humerus.  Initially plate and screws were not visible due to bone had grown completely  encompassing plate and screws and had encompassed the fracture site.  With  an osteotome, bone over the plate area was removed.  Screws were identified  and removed.  Two of the broken screws proximal end was removed and distal  ends were left in the bone.  These two screws were in the proximal fragment  of the fracture.  After removal of the plate and debridement about the  fascia site.  A reamer was used and drill was used down both proximally and  distally at the fracture site creating good bony bleeding  about the area.  Next, Symphony bone grafting material was mixed and was packed down both  canals of the humerus.  Next, the fracture site was then reduced.  With a  compression plate, with locking and some nonlocking screws in good position  and good bone to bone contact.  Further bone grafting material was packed  around the fracture site.  Hemostasis obtained.  Wound closure was then done  with 0 Vicryl for the fascia, 2-0 for the subcutaneous, and skin staples for  the skin.  Big bulky compressive dressing was applied.  Mobilizing sling was  applied.  The patient tolerated the procedure quite well and went to the  recovery room in stable and satisfactory condition.      Myrtie Neither, MD  Electronically Signed     AC/MEDQ  D:  09/23/2005  T:  09/23/2005  Job:  [id number]

## 2011-04-02 NOTE — H&P (Signed)
[name], DINI NO.:  [id number]   MEDICAL RECORD NO.:  [id number]          PATIENT TYPE:  INP   LOCATION:  5157                         FACILITY:  [facility]   PHYSICIAN:  Myrtie Neither, MD      DATE OF BIRTH:  14-Mar-1946   DATE OF ADMISSION:  09/06/2005  DATE OF DISCHARGE:  09/08/2005                                HISTORY & PHYSICAL   CHIEF COMPLAINT:  Painful left arm.   HISTORY OF PRESENT ILLNESS:  This is a 65 year old male who had been treated  in the past for nonunion, fracture of his left humerus, and angulation type  wound to his left arm.  The patient had been doing quite well with fracture  healing of his left humerus and had returned to work as a [employer].  The  patient is using the left arm only to assist himself.  The patient 2 weeks  ago developed severe pain in the left arm with swelling and weakness.  The  patient was found to have refracture of the left humerus with bending and  fracturing of several of the screws.   PAST MEDICAL HISTORY:  High blood pressure, multiple operations of the left  humerus with debridements and ORIF, electrical bone stimulators, bone  grafting.  Left hand surgery with tendon transfers.  Left chest surgery.  Multiple rib fracture with plate fixation and stabilization.  History of  asthma.   ALLERGIES:  No known drug allergies.   MEDICATIONS:  1.  Lisinopril hydrochlorothiazide 20/12.5 1/2 tablet daily.  2.  Percocet q.6 hours p.r.n.  3.  Mobic 50 mg daily.  4.  Albuterol spray p.r.n.  5.  Multivitamin.  6.  Bioflex.   FAMILY HISTORY:  Noncontributory.   SOCIAL HISTORY:  No history of use of alcohol or tobacco.   REVIEW OF SYSTEMS:  No cardiac or respiratory, no urinary or bowel symptoms.   PHYSICAL EXAMINATION:  VITAL SIGNS:  Temperature 98, pulse 80, respirations  16, blood pressure 140/71.  Height 5 feet 8 inches, weight 242.  HEENT:  Head normocephalic and atraumatic.  Eyes; conjunctivae and  sclerae  clear.  NECK:  Supple.  CHEST:  Clear.  HEART:  S1 and S2 regular.  EXTREMITIES:  Left arm humeral area old surgical scars, tenderness, swollen  with slight angulation.  The patient has limited use of the left upper  extremity and hand.  This is from old previous injuries and surgery.   X-ray reveals refracture of the left humerus with fracture of the screws and  dislodgement of the plate and screws left humerus.   IMPRESSION:  Refracture of left humerus with dislodgement and fracture of  the screws left humerus.   PLAN:  Removal of hardware, ORIF left humerus, and bone graft using Symphony  bone grafting.      Myrtie Neither, MD  Electronically Signed     AC/MEDQ  D:  09/23/2005  T:  09/23/2005  Job:  [id number]

## 2011-04-02 NOTE — Discharge Summary (Signed)
[place]. [facility]  Patient:    [name], [name]                    MRN: [id number] Adm. Date:  [id number] Disc. Date: [id number] Attending:  Wende Mott                           Discharge Summary  ADMITTING DIAGNOSES:  1. Nonunion of left humerus.  2. Hardware of left humerus.  3. Possible rotator cuff tear, left shoulder.  DISCHARGE DIAGNOSES:  1. Nonunion of left humerus.  2. Hardware of left humerus.  3. Chronic rotator cuff disease of left shoulder.  COMPLICATIONS: None.  OPERATION/PROCEDURE:  1. Open reduction and internal fixation of nonunion of left humerus with bone     grafting, done on January 20, 2000.  2. Removal of intramedullary rod and screws of left humerus, done on January 20, 2000.  3. Exploration of left rotator cuff, done on January 20, 2000.  HISTORY OF PRESENT ILLNESS: The patient is a 65 year old male who has had open pressure wound to the left arm and shoulder.  The patient had IM nailing of the left humerus which underwent nonunion and limited shoulder activity doing physical therapy.  PHYSICAL EXAMINATION: Pertinent of left shoulder limited abduction and forward extension.  False movement at the fracture site.  HOSPITAL COURSE: The patient underwent surgery on January 20, 2000 and had ORIF of nonunion of the left humerus and bone grafting and exploration of the left rotator cuff.  Postoperatively his hospital course was fairly benign.  The patient was kept on IV antibiotics 24 hours and IM pain medication.  The patients pain came under control and he was able to be switched over to p.o. Percocet 10 tablets and was able to be discharged.  DISCHARGE MEDICATIONS: Percocet 10.  DISCHARGE INSTRUCTIONS: Continue ice packs and use of shoulder immobilizer. Return to the office in one week.  DISCHARGE CONDITION: Stable and satisfactory.DD:  02/17/00 TD:  02/18/00 Job: [id number]
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
## 2011-04-02 NOTE — Op Note (Signed)
[place]. [facility]  Patient:    [name], [name]                    MRN: [id number] Proc. Date: 01/20/00 Adm. Date:  [id number] Disc. Date: [id number] Attending:  Wende Mott                           Operative Report  PREOPERATIVE DIAGNOSES:  1. Intramedullary rod, left humerus, with several screws.  2. Nonunion, left humerus.  3. Possible rotator cuff tear, left shoulder.  POSTOPERATIVE DIAGNOSES:  1. Nonunion of left humerus.  2. Intramedullary rod and screws of left humerus.  3. Chronic degenerated rotator cuff disease, left shoulder.  ANESTHESIA: General.  OPERATION/PROCEDURE:  1. Removal of intramedullary rod and three screws of left humerus.  2. Open reduction and internal fixation of left humerus with compression plate.  3. Bone grafting, left humerus.  4. Exploration of left shoulder rotator cuff.  SURGEON:  Kennieth Rad, M.D.  DESCRIPTION OF PROCEDURE: The patient was taken to the operating room after given - given general anesthesia and intubated.  The patient was placed in the barber  chair position and the left shoulder and arm prepped with DuraPrep and draped in a sterile manner.  The Bovie was used for hemostasis.  A shoulder incision was made going through the previous scar at the shoulder strap over the left shoulder, going through the skin and subcutaneous tissue.  The deltoid was reflected off the acromion and inspection of the shoulder revealed old degenerated rotator cuff disease.  The humerus itself had subluxed proximally and trapped up against the  acromion.  Soft tissue was too retracted and was too degenerated to do any repair. Identification of the proximal end of the IM nail was made and the IM nail was removed.  The cross-fixing screws initially were removed from the nail, which allowed it to be distracted.  A separate incision was made at the fracture site  distally going through previous old  scar, down through the subcutaneous tissue nd muscle tissue with periosteal elevator from the fracture site and nonunion site  identified.  Fibrous tissue was completely resected down to good bone.  The ends were curetted quite well.  A nine hole compression plate was applied across the  fracture site and the humerus reduced.  Eight screws were placed, stabilizing the fracture site.  Bone graft was applied to the area.  Irrigation with antibiotic  solution was done.  Wound closure was then done with 0 Vicryl for the fascia and muscle fascial tissue, 2-0 for the subcutaneous, and skin staples for the skin.  The same wound closure was done for the shoulder with reapproximation of the deltoid with 0 Vicryl, 2-0 Vicryl for the subcutaneous, and skin staples for the skin.  A compressive dressing and bulky dressings were applied to both areas. he patient was placed in a shoulder immobilizer.  The patient tolerated the procedure quite well and went to the recovery room in stable and satisfactory condition. DD:  02/17/00 TD:  02/18/00 Job: [id number]

## 2011-04-02 NOTE — Op Note (Signed)
NAME:  [name], [name]                       ACCOUNT NO.:  [id number]   MEDICAL RECORD NO.:  [id number]                   PATIENT TYPE:  AMB   LOCATION:  ENDO                                 FACILITY[facility]  [facility]   PHYSICIAN:  John C. Madilyn Fireman, M.D.                 DATE OF BIRTH:  [birth year]   DATE OF PROCEDURE:  04/01/2004  DATE OF DISCHARGE:                                 OPERATIVE REPORT   INDICATION FOR PROCEDURE:  History of adenomatous colon polyps, last  colonoscopy five years ago.   PROCEDURE:  The patient was placed in the left lateral decubitus position  and placed on a pulse monitor with continuous low flow oxygen delivered by  nasal cannula.  He was sedated with 62.5 mcg IV fentanyl and 5 mg IV Versed.  An Olympus video colonoscope was inserted into the rectum and advanced to  the cecum, confirmed by transillumination of McBurney's point and  visualization of the ileocecal valve and appendiceal orifice.  The prep was  excellent.  The cecum, ascending, transverse, descending and sigmoid colon  all appeared normal with no masses, polyps, diverticula or other mucosal  abnormalities.  The rectum likewise appeared normal.  On retroflexed view  the anus revealed no obvious internal hemorrhoids.  The scope was then  withdrawn and the patient returned to the recovery room in stable condition.  He tolerated the procedure well and there were no immediate complications.   IMPRESSION:  Normal study.   PLAN:  Repeat study in five years.                                               John C. Madilyn Fireman, M.D.    JCH/MEDQ  D:  04/01/2004  T:  04/01/2004  Job:  [id number]   cc:   Jonita Albee, M.D.  [facility]  [address]  [place]  [place] [address]  Fax: [phone]

## 2011-04-02 NOTE — Discharge Summary (Signed)
[name], HUPFER NO.:  [id number]   MEDICAL RECORD NO.:  [id number]          PATIENT TYPE:  INP   LOCATION:  5157                         FACILITY:  [facility]   PHYSICIAN:  Myrtie Neither, MD      DATE OF BIRTH:  [birth year]   DATE OF ADMISSION:  09/06/2005  DATE OF DISCHARGE:  09/08/2005                                 DISCHARGE SUMMARY   ADMISSION DIAGNOSIS:  Refracture of left humerus with broken hardware, high  blood pressure.   DISCHARGE DIAGNOSIS:  Refracture of left humerus with broken hardware, high  blood pressure.   COMPLICATIONS:  None.   INFECTIONS:  None.   PROCEDURE:  Removal of hardware left humerus and ORIF left humerus with bone  grafting.  Application of longarm cast.   HISTORY OF PRESENT ILLNESS:  This is a 65 year old male with previous left  humeral fracture with ORIF and bone grafting and had done quite well.  Had  returned to work using the left arm to assist.  The patient developed  refracture of the left humerus.  The patient was found to have refracture of  the humerus with broken screw and dislodgement of the plate.   PERTINENT PHYSICAL:  Left humerus tender and swollen with deformities.  Skin  is intact.   HOSPITAL COURSE:  The patient had underwent preoperative laboratory, CBC,  UA, EKG, chest x-ray which was found to be stable enough for the patient to  undergo surgery.  The patient underwent removal of hardware and ORIF with  bone graft of the left humerus.  Tolerated the procedure quite well.  Postoperative course is fairly benign.  The patient received preoperative  and postoperative IV antibiotics.  Ice packs, Percocet 10/650 q.4 hours  p.r.n. for pain.  The patient is instructed in using spirometry and was able  to be discharged home with use of long arm cast, Percocet q.4 hours p.r.n.  for pain, and return to the office in 1 week.  The patient was discharged in  stable and satisfactory condition.      Myrtie Neither,  MD  Electronically Signed     AC/MEDQ  D:  09/23/2005  T:  09/23/2005  Job:  [id number]
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
## 2011-08-20 LAB — CBC
HCT: 30 — ABNORMAL LOW
HCT: 36.8 — ABNORMAL LOW
Hemoglobin: 12.8 — ABNORMAL LOW
MCV: 86.6
MCV: 86.9
Platelets: 113 — ABNORMAL LOW
Platelets: 151
RDW: 14
RDW: 14.1
RDW: 14.2
WBC: 8.3

## 2011-08-20 LAB — DIFFERENTIAL
Eosinophils Absolute: 0.2
Eosinophils Relative: 5
Neutro Abs: 1.7

## 2011-08-20 LAB — ABO/RH: ABO/RH(D): O POS

## 2011-08-20 LAB — TYPE AND SCREEN

## 2011-08-20 LAB — BASIC METABOLIC PANEL
BUN: 11
BUN: 13
BUN: 9
CO2: 29
CO2: 29
Calcium: 8.2 — ABNORMAL LOW
Calcium: 9.7
Chloride: 106
Chloride: 106
Creatinine, Ser: 0.99
Creatinine, Ser: 1.17
GFR calc Af Amer: >60
GFR calc Af Amer: >60
GFR calc non Af Amer: >60
GFR calc non Af Amer: >60
Glucose, Bld: 104 — ABNORMAL HIGH
Glucose, Bld: 127 — ABNORMAL HIGH
Glucose, Bld: 129 — ABNORMAL HIGH
Potassium: 4.3
Potassium: 4.7
Potassium: 4.8
Sodium: 141

## 2011-08-20 LAB — URINALYSIS, ROUTINE W REFLEX MICROSCOPIC
Ketones, ur: NEGATIVE
Specific Gravity, Urine: 1.01

## 2011-08-20 LAB — HEPATIC FUNCTION PANEL
AST: 38 — ABNORMAL HIGH
Alkaline Phosphatase: 78
Total Bilirubin: 1.1

## 2011-08-20 LAB — PROTIME-INR
INR: 0.9
Prothrombin Time: 12.7

## 2011-08-20 LAB — APTT: aPTT: 29

## 2011-10-26 ENCOUNTER — Encounter (INDEPENDENT_AMBULATORY_CARE_PROVIDER_SITE_OTHER): Payer: 59 | Admitting: Emergency Medicine

## 2011-10-26 ENCOUNTER — Other Ambulatory Visit: Payer: Self-pay | Admitting: Emergency Medicine

## 2011-10-26 DIAGNOSIS — Z0000 Encounter for general adult medical examination without abnormal findings: Secondary | ICD-10-CM

## 2011-10-26 DIAGNOSIS — I10 Essential (primary) hypertension: Secondary | ICD-10-CM

## 2011-10-26 DIAGNOSIS — E789 Disorder of lipoprotein metabolism, unspecified: Secondary | ICD-10-CM

## 2011-10-26 DIAGNOSIS — E119 Type 2 diabetes mellitus without complications: Secondary | ICD-10-CM

## 2011-10-27 ENCOUNTER — Encounter: Payer: Self-pay | Admitting: Family Medicine

## 2011-10-27 DIAGNOSIS — I10 Essential (primary) hypertension: Secondary | ICD-10-CM | POA: Insufficient documentation

## 2011-10-27 DIAGNOSIS — E785 Hyperlipidemia, unspecified: Secondary | ICD-10-CM | POA: Insufficient documentation

## 2011-10-27 DIAGNOSIS — N281 Cyst of kidney, acquired: Secondary | ICD-10-CM

## 2011-10-27 DIAGNOSIS — R739 Hyperglycemia, unspecified: Secondary | ICD-10-CM

## 2011-10-27 DIAGNOSIS — Z0271 Encounter for disability determination: Secondary | ICD-10-CM

## 2011-10-27 DIAGNOSIS — M1990 Unspecified osteoarthritis, unspecified site: Secondary | ICD-10-CM

## 2011-11-02 ENCOUNTER — Ambulatory Visit (INDEPENDENT_AMBULATORY_CARE_PROVIDER_SITE_OTHER): Payer: 59

## 2011-11-02 DIAGNOSIS — R51 Headache: Secondary | ICD-10-CM

## 2011-11-02 DIAGNOSIS — J069 Acute upper respiratory infection, unspecified: Secondary | ICD-10-CM

## 2011-11-04 ENCOUNTER — Ambulatory Visit (INDEPENDENT_AMBULATORY_CARE_PROVIDER_SITE_OTHER): Payer: 59

## 2011-11-04 DIAGNOSIS — G4484 Primary exertional headache: Secondary | ICD-10-CM

## 2011-11-04 DIAGNOSIS — Q828 Other specified congenital malformations of skin: Secondary | ICD-10-CM

## 2011-12-05 ENCOUNTER — Ambulatory Visit (INDEPENDENT_AMBULATORY_CARE_PROVIDER_SITE_OTHER): Payer: 59

## 2011-12-05 DIAGNOSIS — R05 Cough: Secondary | ICD-10-CM

## 2011-12-05 DIAGNOSIS — R0602 Shortness of breath: Secondary | ICD-10-CM

## 2011-12-05 DIAGNOSIS — J189 Pneumonia, unspecified organism: Secondary | ICD-10-CM

## 2011-12-13 ENCOUNTER — Ambulatory Visit (INDEPENDENT_AMBULATORY_CARE_PROVIDER_SITE_OTHER): Payer: 59

## 2011-12-13 DIAGNOSIS — J9801 Acute bronchospasm: Secondary | ICD-10-CM

## 2011-12-13 DIAGNOSIS — N281 Cyst of kidney, acquired: Secondary | ICD-10-CM

## 2011-12-16 ENCOUNTER — Other Ambulatory Visit: Payer: Self-pay | Admitting: Emergency Medicine

## 2011-12-16 DIAGNOSIS — N281 Cyst of kidney, acquired: Secondary | ICD-10-CM

## 2011-12-22 ENCOUNTER — Other Ambulatory Visit: Payer: Self-pay | Admitting: Emergency Medicine

## 2011-12-22 ENCOUNTER — Ambulatory Visit
Admission: RE | Admit: 2011-12-22 | Discharge: 2011-12-22 | Disposition: A | Discharge status: Home / Self Care | Payer: 59 | Source: Ambulatory Visit | Attending: Emergency Medicine | Admitting: Emergency Medicine

## 2011-12-22 DIAGNOSIS — N281 Cyst of kidney, acquired: Secondary | ICD-10-CM

## 2011-12-22 MED ORDER — IOHEXOL 300 MG/ML  SOLN
125.0000 mL | Freq: Once | INTRAMUSCULAR | Status: AC | PRN
  Administered 2011-12-22: 125 mL via INTRAVENOUS

## 2011-12-23 ENCOUNTER — Other Ambulatory Visit: Payer: 59

## 2012-01-24 ENCOUNTER — Other Ambulatory Visit: Payer: Self-pay | Admitting: Emergency Medicine

## 2012-01-25 ENCOUNTER — Ambulatory Visit: Payer: 59 | Admitting: Emergency Medicine

## 2012-01-26 ENCOUNTER — Telehealth: Payer: Self-pay

## 2012-01-26 MED ORDER — METFORMIN HCL 500 MG PO TABS: ORAL_TABLET | ORAL | Status: DC

## 2012-01-26 NOTE — Telephone Encounter (Signed)
.  [facility]  PT STATES HE IS OUT OF HIS METFORMIN AND NEED TO HAVE SOME CALLED IN PLEASE CALL [phone]  [facility] ON [address]

## 2012-01-26 NOTE — Telephone Encounter (Signed)
Pt has an appt on 02/04/12.  Advised pt rx was sent in.

## 2012-02-04 ENCOUNTER — Ambulatory Visit (INDEPENDENT_AMBULATORY_CARE_PROVIDER_SITE_OTHER): Payer: 59 | Admitting: Emergency Medicine

## 2012-02-04 DIAGNOSIS — E119 Type 2 diabetes mellitus without complications: Secondary | ICD-10-CM

## 2012-02-04 DIAGNOSIS — E785 Hyperlipidemia, unspecified: Secondary | ICD-10-CM

## 2012-02-04 DIAGNOSIS — I10 Essential (primary) hypertension: Secondary | ICD-10-CM

## 2012-02-04 DIAGNOSIS — E782 Mixed hyperlipidemia: Secondary | ICD-10-CM

## 2012-02-04 MED ORDER — SIMVASTATIN 40 MG PO TABS: 40.0000 mg | ORAL_TABLET | Freq: Every day | ORAL | Status: DC

## 2012-02-04 MED ORDER — BISOPROLOL-HYDROCHLOROTHIAZIDE 10-6.25 MG PO TABS: 1.0000 | ORAL_TABLET | Freq: Two times a day (BID) | ORAL | Status: DC

## 2012-02-04 MED ORDER — METFORMIN HCL 500 MG PO TABS: ORAL_TABLET | ORAL | Status: DC

## 2012-02-04 NOTE — Progress Notes (Signed)
  Subjective:    Patient ID: [name], male    DOB: January 27, 1946, 66 y.o.   MRN: [id number]  HPI patient had a followup diabetes high blood high cholesterol. He is doing well at home he has no specific problems at the present time he's had no chest pain shortness of breath or any other symptoms. He has not any problems with his feet tissue breakdown or numbness.    Review of Systems noncontributory except as relates to the present    Objective:   Physical Exam  HENT:  Head: Normocephalic.  Eyes: Pupils are equal, round, and reactive to light.  Neck: No JVD present. No tracheal deviation present. No thyromegaly present.  Cardiovascular: Normal rate and regular rhythm.  Exam reveals no gallop and no friction rub.   No murmur heard. Pulmonary/Chest: No stridor. No respiratory distress. He has no wheezes. He has no rales. He exhibits no tenderness.  Musculoskeletal:       He has had significant damage to his left arm he has a dislocated proximal clavicle.  Lymphadenopathy:    He has no cervical adenopathy.          Assessment & Plan:

## 2012-04-18 ENCOUNTER — Encounter: Payer: Self-pay | Admitting: Emergency Medicine

## 2012-05-30 ENCOUNTER — Encounter: Payer: Self-pay | Admitting: Emergency Medicine

## 2012-05-30 ENCOUNTER — Ambulatory Visit (INDEPENDENT_AMBULATORY_CARE_PROVIDER_SITE_OTHER): Payer: 59 | Admitting: Emergency Medicine

## 2012-05-30 VITALS — BP 123/73 | HR 62 | Temp 97.8°F | Resp 16 | Ht 68.0 in | Wt 260.6 lb

## 2012-05-30 DIAGNOSIS — E785 Hyperlipidemia, unspecified: Secondary | ICD-10-CM

## 2012-05-30 DIAGNOSIS — I10 Essential (primary) hypertension: Secondary | ICD-10-CM

## 2012-05-30 DIAGNOSIS — E119 Type 2 diabetes mellitus without complications: Secondary | ICD-10-CM

## 2012-05-30 LAB — COMPREHENSIVE METABOLIC PANEL
ALT: 32 U/L (ref 0–53)
AST: 30 U/L (ref 0–37)
Alkaline Phosphatase: 81 U/L (ref 39–117)
Sodium: 141 mEq/L (ref 135–145)
Total Bilirubin: 0.5 mg/dL (ref 0.3–1.2)
Total Protein: 6.9 g/dL (ref 6.0–8.3)

## 2012-05-30 LAB — LIPID PANEL
HDL: 28 mg/dL — ABNORMAL LOW (ref >39)
LDL Cholesterol: 45 mg/dL (ref 0–99)
Total CHOL/HDL Ratio: 3.6 Ratio
VLDL: 27 mg/dL (ref 0–40)

## 2012-05-30 MED ORDER — SIMVASTATIN 40 MG PO TABS: 40.0000 mg | ORAL_TABLET | Freq: Every day | ORAL | Status: DC

## 2012-05-30 MED ORDER — BISOPROLOL-HYDROCHLOROTHIAZIDE 10-6.25 MG PO TABS: 1.0000 | ORAL_TABLET | Freq: Two times a day (BID) | ORAL | Status: DC

## 2012-05-30 MED ORDER — METFORMIN HCL 500 MG PO TABS: ORAL_TABLET | ORAL | Status: DC

## 2012-05-30 NOTE — Progress Notes (Signed)
  Subjective:    Patient ID: [name], male    DOB: [birth year], 66 y.o.   MRN: [id number]  HPI patient in for followup he states he feels well. He recently had his visit to Dr. Herbie Baltimore and everything was good. He's been trying to watch his diet and taking his medication and regular.    Review of Systems     Objective:   Physical Exam  Constitutional: He appears well-developed and well-nourished.  Eyes: Pupils are equal, round, and reactive to light.  Neck: No thyromegaly present.  Cardiovascular: Normal rate and regular rhythm.   Pulmonary/Chest: Effort normal and breath sounds normal.  Musculoskeletal:       Examination of the feet reveal 2+ pulses normal sensation skin looks intact   Results for orders placed in visit on 02/04/12  GLUCOSE, POCT (MANUAL RESULT ENTRY)      Component Value Range   POC Glucose 138    POCT GLYCOSYLATED HEMOGLOBIN (HGB A1C)      Component Value Range   Hemoglobin A1C 5.9     Results for orders placed in visit on 05/30/12  GLUCOSE, POCT (MANUAL RESULT ENTRY)      Component Value Range   POC Glucose 119 (*) 70 - 99 mg/dl   Results for orders placed in visit on 05/30/12  GLUCOSE, POCT (MANUAL RESULT ENTRY)      Component Value Range   POC Glucose 119 (*) 70 - 99 mg/dl  POCT GLYCOSYLATED HEMOGLOBIN (HGB A1C)      Component Value Range   Hemoglobin A1C 6.0          Assessment & Plan:  All medical issues seems to be stable at present. We'll check routine labs and make sure Dr. Herbie Baltimore has a copy of these. No change in medications at present .

## 2012-06-08 ENCOUNTER — Telehealth: Payer: Self-pay

## 2012-06-08 NOTE — Telephone Encounter (Signed)
PT WOULD LIKE TO SPEAK WITH DR DAUB'S NURSE. HAD SPOKEN WITH HER LAST WEEK AND FORGOT TO ASK ANOTHER QUESTION PLEASE CALL [phone]

## 2012-06-11 NOTE — Telephone Encounter (Signed)
Dr Cleta Alberts will you review this please.

## 2012-06-11 NOTE — Telephone Encounter (Signed)
PT WOULD LIKE TO GET AN INVERSION TABLE FOR BACK AND SINCE HE HAS HIGH BLOOD PRESSURE IT SAYS TO ASK YOUR DR BEFORE USING IT.  IS IT OK TO USE IT?

## 2012-06-13 NOTE — Telephone Encounter (Signed)
Called to advise inversion table not a good idea, with his medical problems, he will think about the Chiropractor.

## 2012-06-13 NOTE — Telephone Encounter (Signed)
I would prefer he see a physical therapist and let them work with him regarding his back. there is a good chiropractor I can recommend to work with him for his back I do not think with his underlying medical problems an inversion table would not be a good idea

## 2012-06-20 ENCOUNTER — Encounter: Payer: Self-pay | Admitting: Emergency Medicine

## 2012-07-12 ENCOUNTER — Ambulatory Visit (INDEPENDENT_AMBULATORY_CARE_PROVIDER_SITE_OTHER): Payer: 59 | Admitting: Family Medicine

## 2012-07-12 VITALS — BP 130/80 | HR 68 | Temp 98.0°F | Resp 18 | Ht 68.0 in | Wt 264.0 lb

## 2012-07-12 DIAGNOSIS — M436 Torticollis: Secondary | ICD-10-CM

## 2012-07-12 DIAGNOSIS — J029 Acute pharyngitis, unspecified: Secondary | ICD-10-CM

## 2012-07-12 MED ORDER — AMOXICILLIN 875 MG PO TABS: 875.0000 mg | ORAL_TABLET | Freq: Two times a day (BID) | ORAL | Status: AC

## 2012-07-12 MED ORDER — DICLOFENAC SODIUM 75 MG PO TBEC: 75.0000 mg | DELAYED_RELEASE_TABLET | Freq: Two times a day (BID) | ORAL | Status: AC

## 2012-07-12 NOTE — Progress Notes (Signed)
@[facility] @   Patient ID: [name] MRN: [id number], DOB: [birth year], 66 y.o. Date of Encounter: 07/12/2012, 8:14 AM  Primary Physician: No primary provider on file.  Chief Complaint:  Chief Complaint  Patient presents with  . Sore Throat    2days  . Neck Pain    2 days    HPI: 66 y.o. year old male presents with day history of sore throat. Subjective fever and chills. No cough, congestion, rhinorrhea, sinus pressure, otalgia, or headache. Normal hearing. No GI complaints. Able to swallow saliva, but hurts to do so. Decreased appetite secondary to sore throat.  Also complains of a stiff neck, although is able to rotate over 30 degrees each way  No past medical history on file.   Home Meds: Prior to Admission medications   Medication Sig Start Date End Date Taking? Authorizing Provider  albuterol (PROVENTIL) (2.5 MG/3ML) 0.083% nebulizer solution Take 2.5 mg by nebulization every 6 (six) hours as needed.    Historical Provider, MD  aspirin 81 MG tablet Take 81 mg by mouth daily.    Historical Provider, MD  bisoprolol-hydrochlorothiazide [facility]) 10-6.25 MG per tablet Take 1 tablet by mouth 2 (two) times daily. 05/30/12   Collene Gobble, MD  metFORMIN (GLUCOPHAGE) 500 MG tablet Take 1 tablet twice daily with meals 05/30/12   Collene Gobble, MD  Misc Natural Products (OSTEO BI-FLEX TRIPLE STRENGTH) TABS Take by mouth.    Historical Provider, MD  Multiple Vitamin (MULTIVITAMIN) capsule Take 1 capsule by mouth daily.    Historical Provider, MD  simvastatin (ZOCOR) 40 MG tablet Take 1 tablet (40 mg total) by mouth at bedtime. 05/30/12   Collene Gobble, MD    Allergies: No Known Allergies  History   Social History  . Marital Status: Married    Spouse Name: N/A    Number of Children: N/A  . Years of Education: N/A   Occupational History  . Not on file.   Social History Main Topics  . Smoking status: Former Games developer  . Smokeless tobacco: Not on file  . Alcohol Use: Not on file   . Drug Use: Not on file  . Sexually Active: Not on file   Other Topics Concern  . Not on file   Social History Narrative  . No narrative on file     Review of Systems: Constitutional: negative for chills, fever, night sweats or weight changes HEENT: see above Cardiovascular: negative for chest pain or palpitations Respiratory: negative for hemoptysis, wheezing, or shortness of breath Abdominal: negative for abdominal pain, nausea, vomiting or diarrhea Dermatological: negative for rash Neurologic: negative for headache   Physical Exam: Blood pressure 130/80, pulse 68, temperature 98 F (36.7 C), temperature source Oral, resp. rate 18, height 5\' 8"  (1.727 m), weight 264 lb (119.75 kg), SpO2 96.00%., Body mass index is 40.14 kg/(m^2). General: Well developed, well nourished, in no acute distress. Head: Normocephalic, atraumatic, eyes without discharge, sclera non-icteric, nares are patent. Bilateral auditory canals clear, TM's are without perforation, pearly grey with reflective cone of light bilaterally. No sinus TTP. Oral cavity moist, dentition normal. Posterior pharynx with post nasal drip and mild erythema. No peritonsillar abscess or tonsillar exudate. Neck: Supple. No thyromegaly. Full ROM. No lymphadenopathy. Lungs: Clear bilaterally to auscultation without wheezes, rales, or rhonchi. Breathing is unlabored. Heart: RRR with S1 S2. No murmurs, rubs, or gallops appreciated. Abdomen: Soft, non-tender, non-distended with normoactive bowel sounds. No hepatomegaly. No rebound/guarding. No obvious abdominal masses. Msk:  Strength  and tone normal for age. Extremities: No clubbing or cyanosis. No edema. Neuro: Alert and oriented X 3. Moves all extremities spontaneously. CNII-XII grossly in tact. Psych:  Responds to questions appropriately with a normal affect.   Labs:   ASSESSMENT AND PLAN:  66 y.o. year old male with pharyngitis 1. Pharyngitis  Culture, Group A Strep,  amoxicillin (AMOXIL) 875 MG tablet  2. Torticollis  diclofenac (VOLTAREN) 75 MG EC tablet     - -Tylenol/Motrin prn -Rest/fluids -RTC precautions -RTC 3-5 days if no improvement  Signed, Elvina Sidle, MD 07/12/2012 8:14 AM

## 2012-07-14 LAB — CULTURE, GROUP A STREP: Organism ID, Bacteria: NORMAL

## 2012-10-03 ENCOUNTER — Encounter: Payer: Self-pay | Admitting: Emergency Medicine

## 2012-10-03 ENCOUNTER — Ambulatory Visit (INDEPENDENT_AMBULATORY_CARE_PROVIDER_SITE_OTHER): Payer: 59 | Admitting: Emergency Medicine

## 2012-10-03 VITALS — BP 125/68 | HR 60 | Temp 98.1°F | Resp 18 | Ht 68.0 in | Wt 268.0 lb

## 2012-10-03 DIAGNOSIS — E119 Type 2 diabetes mellitus without complications: Secondary | ICD-10-CM

## 2012-10-03 DIAGNOSIS — I10 Essential (primary) hypertension: Secondary | ICD-10-CM

## 2012-10-03 DIAGNOSIS — E785 Hyperlipidemia, unspecified: Secondary | ICD-10-CM

## 2012-10-03 DIAGNOSIS — Z23 Encounter for immunization: Secondary | ICD-10-CM

## 2012-10-03 LAB — LIPID PANEL
Cholesterol: 95 mg/dL (ref 0–200)
HDL: 26 mg/dL — ABNORMAL LOW (ref >39)
Triglycerides: 154 mg/dL — ABNORMAL HIGH (ref <150)
VLDL: 31 mg/dL (ref 0–40)

## 2012-10-03 NOTE — Progress Notes (Signed)
  Subjective:    Patient ID: [name], male    DOB: 05-07-1946, 66 y.o.   MRN: [id number]  HPI problem #1 diabetes. Patient has been following his diet closely. He's taking his Glucophage. He has no complaints of chest pain shortness of breath or other problems problem #2 high cholesterol. He's been following his diet taking his Zocor and no complaints regarding this. Problem #3 hypertension. He continues on Ziac for blood pressure control he's had no problems with this medication    Review of Systems     Objective:   Physical Exam patient is alert and cooperative in no distress. His neck is supple. He is a deformity of his left arm which is chronic. His chest is clear to auscultation and percussion. His cardiac exam reveals regular rate and rhythm without murmurs rubs or gallops. The abdomen is soft. Extremity exam reveals no diabetic changes sensation normal no ulceration seen.  Results for orders placed in visit on 10/03/12  GLUCOSE, POCT (MANUAL RESULT ENTRY)      Component Value Range   POC Glucose 121 (*) 70 - 99 mg/dl  POCT GLYCOSYLATED HEMOGLOBIN (HGB A1C)      Component Value Range   Hemoglobin A1C 6.0         Assessment & Plan:  Patient stable at present we'll schedule his next visit for a physical. He was given a flu shot today routine labs were drawn. Hemoglobin A1c is good at 6.0.

## 2012-10-11 ENCOUNTER — Telehealth: Payer: Self-pay

## 2012-10-11 NOTE — Telephone Encounter (Signed)
PT WOULD LIKE A REFILL OF HIS MEDICATIONS UNTIL HIS APPT. WITH DR. DAUB IN April. HE DOES NOT WANT TO HAVE TO COME IN AND PAY ANOTHER CO-PAY. PLEASE CALL. [phone]

## 2012-10-11 NOTE — Telephone Encounter (Signed)
He is advised he has refills, he states he looked at the bottles wrong.

## 2013-01-02 ENCOUNTER — Encounter: Payer: 59 | Admitting: Emergency Medicine

## 2013-02-19 ENCOUNTER — Telehealth: Payer: Self-pay

## 2013-02-19 MED ORDER — ALBUTEROL SULFATE HFA 108 (90 BASE) MCG/ACT IN AERS: 2.0000 | INHALATION_SPRAY | RESPIRATORY_TRACT | Status: DC | PRN

## 2013-02-19 NOTE — Telephone Encounter (Signed)
[facility] request refill on Ventolin inhaler.

## 2013-02-19 NOTE — Telephone Encounter (Signed)
Rx sent to pharmacy   

## 2013-02-26 IMAGING — CT CT ABDOMEN WO/W CM
4 of 9 series · 13 of 36 positions shown, 19 images · IV contrast (WATER)
Comparison: 06/09/2007

CLINICAL DATA: Evaluate renal cyst.

CT ABDOMEN WITHOUT AND WITH CONTRAST
TECHNIQUE: Multidetector CT imaging of the abdomen was performed
following the standard protocol before and during bolus
administration of intravenous contrast.
Contrast: 125mL OMNIPAQUE IOHEXOL 300 MG/ML IV SOLN

[Series 3: arterial/nephrographic · axial · arterial · 0.96mm/px · z∈[-201,-21]mm · 4 of 242 slices shown, 9 images]
[im 49/242  soft-tissue]
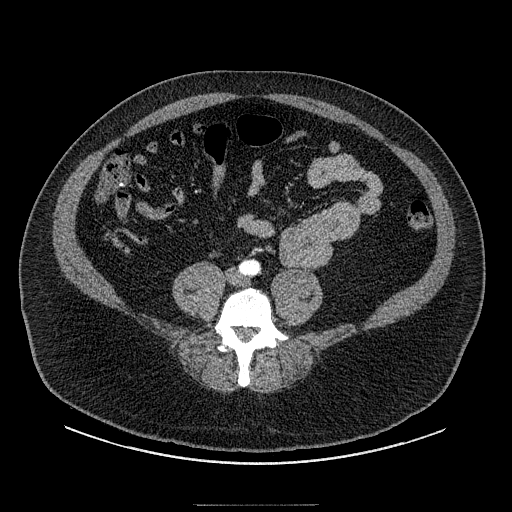
[im 49/242  lung]
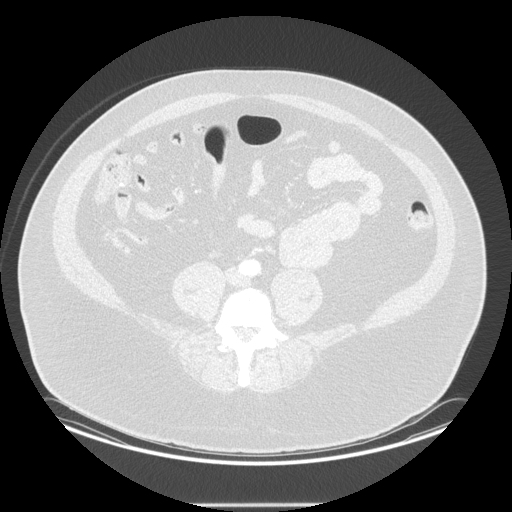
[im 49/242  bone]
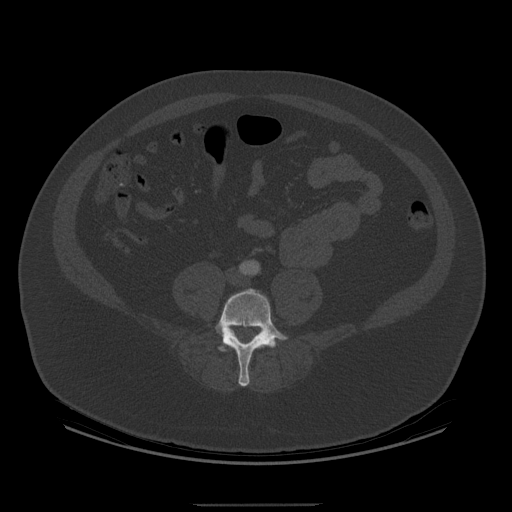
[im 97/242  soft-tissue]
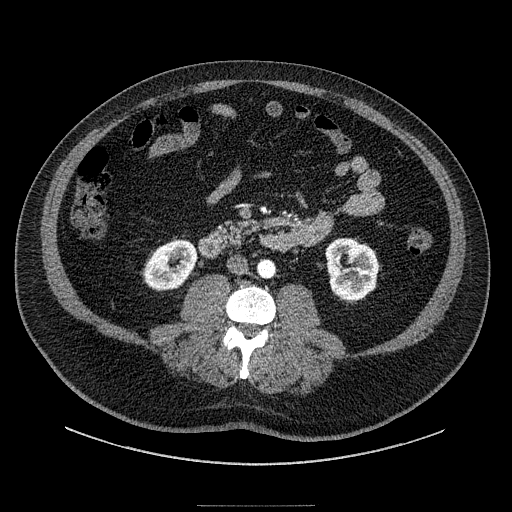
[im 97/242  lung]
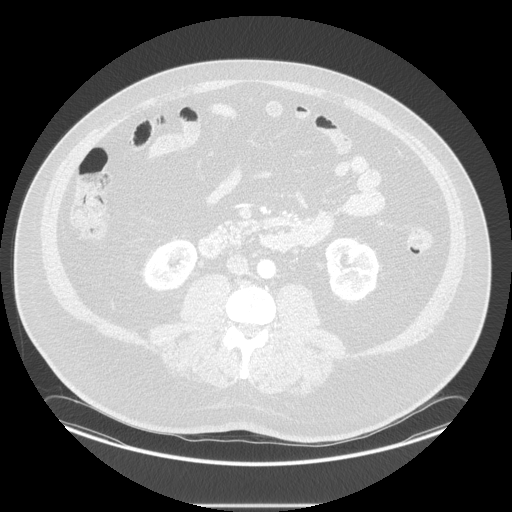
[im 145/242  soft-tissue]
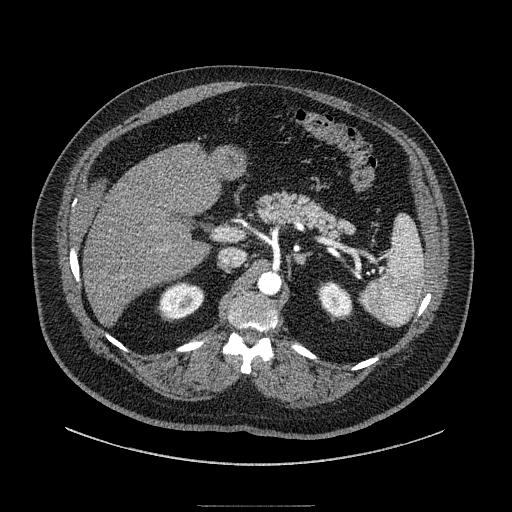
[im 145/242  lung]
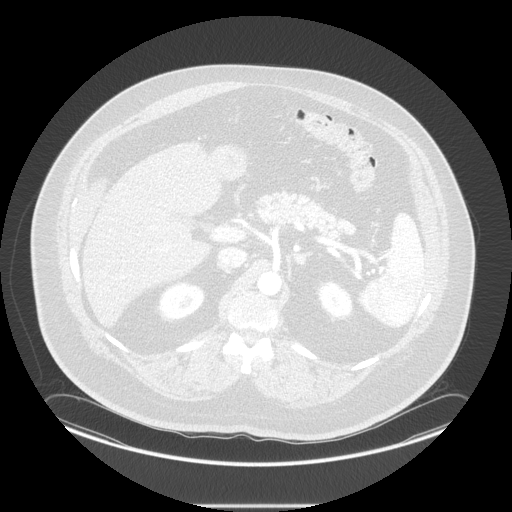
[im 193/242  soft-tissue]
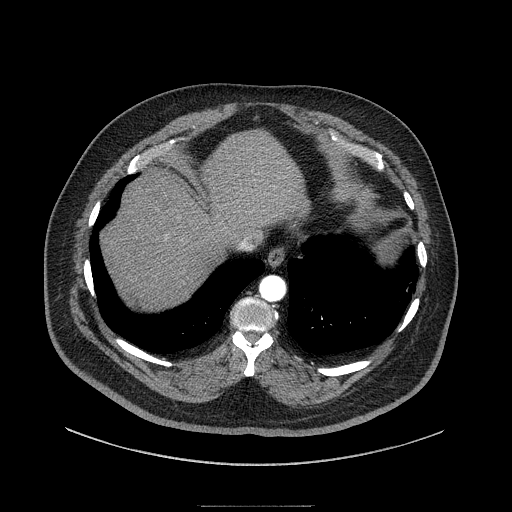
[im 193/242  lung]
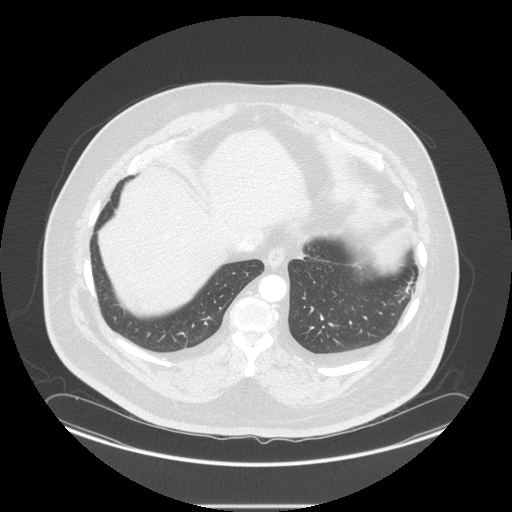

[Series 601: coronal body · coronal · 0.96mm/px · 1 of 145 slices shown, 2 images]
[im 2/145  soft-tissue]
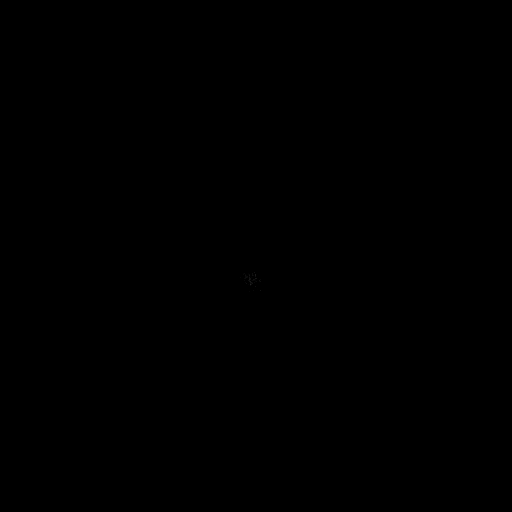
[im 2/145  bone]
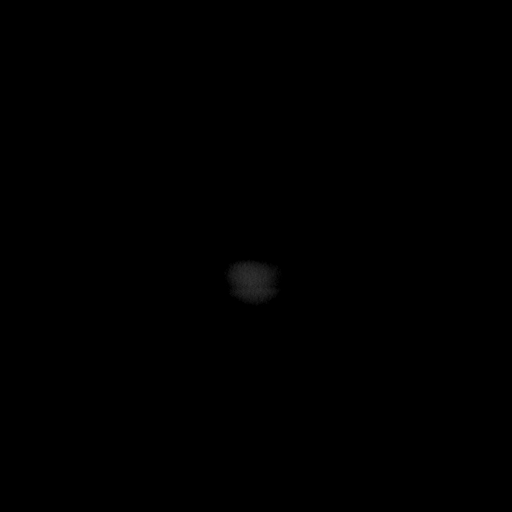

[Series 602: sagittal body · sagittal · 0.96mm/px · 4 of 184 slices shown (1 of 2)]
[im 37/184  soft-tissue]
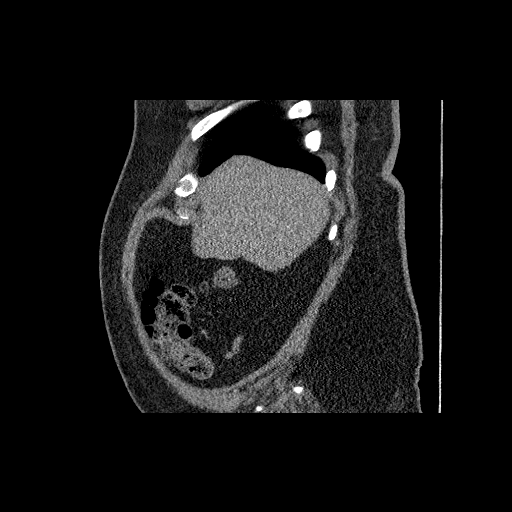
[im 74/184  soft-tissue]
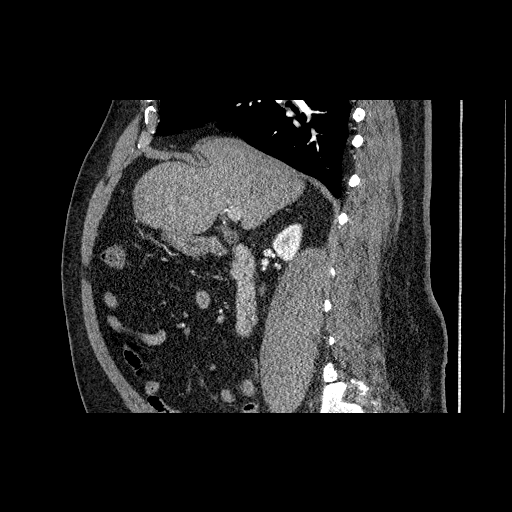
[im 110/184  soft-tissue]
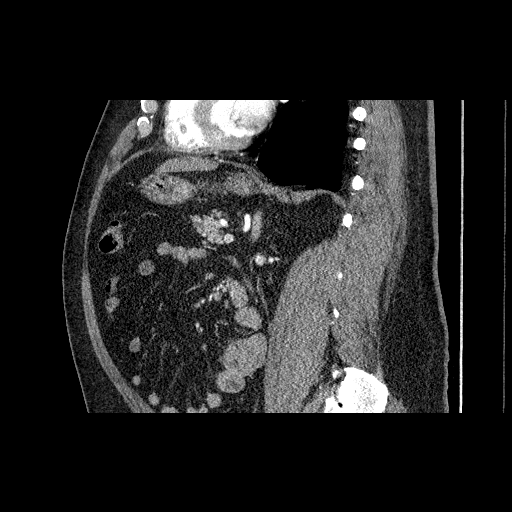
[im 147/184  soft-tissue]
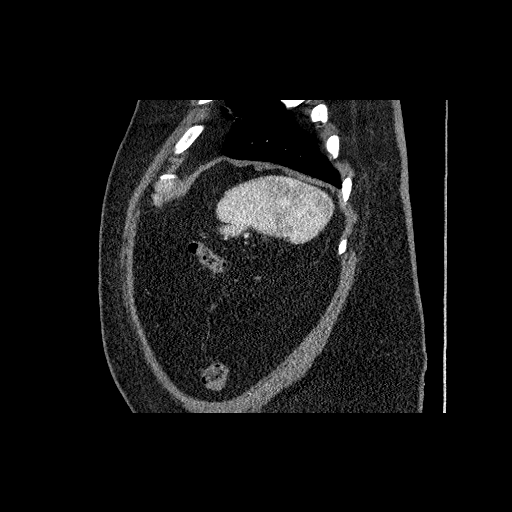

[Series 602: sagittal body · sagittal · 0.96mm/px · 4 of 188 slices shown (2 of 2)]
[im 38/188  soft-tissue]
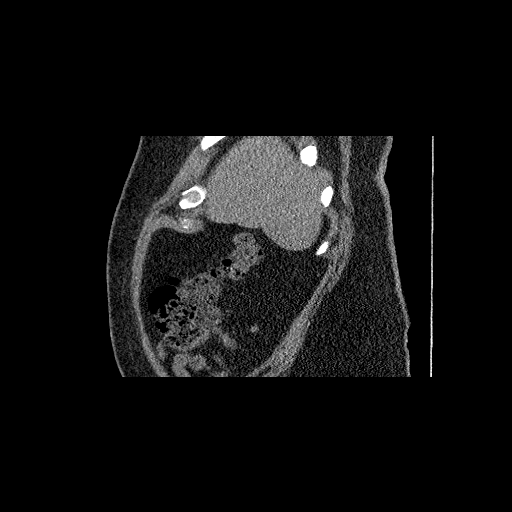
[im 75/188  soft-tissue]
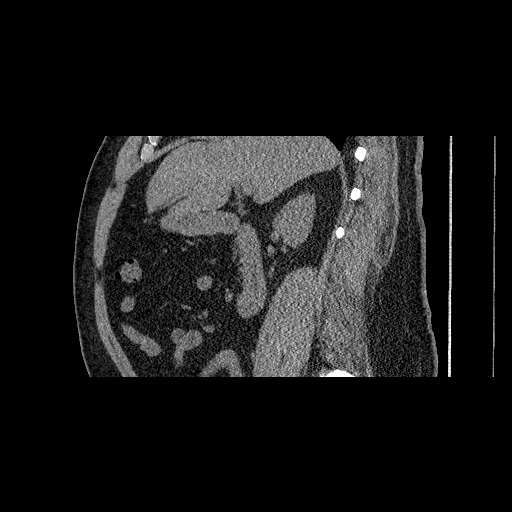
[im 113/188  soft-tissue]
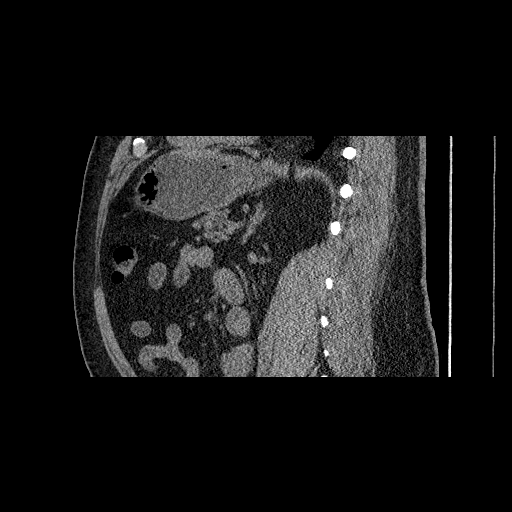
[im 150/188  soft-tissue]
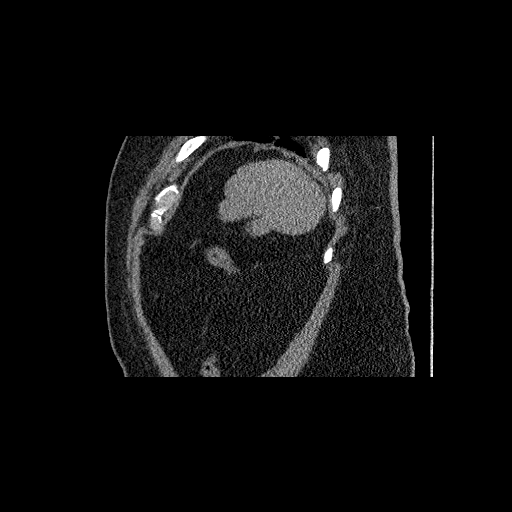

[13 of 36 positions shown; findings below may reference images not displayed]

FINDINGS: Lung bases are clear.

No focal liver abnormalities identified.

The gallbladder appears normal.

No biliary dilatation.

The pancreas is negative.

No evidence for nephrolithiasis.

Within the lower pole of the left kidney there is a cyst measuring
1.4 cm, image 72.
Within the upper pole of the right kidney there is a small
hypodensity measuring 0.7 cm, image 66.  This is too small to
characterize.

Small cyst arises from the upper pole of the left kidney measuring
0.7 cm, image 94 of the coronal series.  Several other small
hypodense structures are identified within the left kidney, which
are too small to characterize but also likely represents small
cysts.  On the delayed images there are symmetric urograms.  No
collecting system filling defects identified.

No enlarged upper abdominal adenopathy.  There is no upper
abdominal ascites or fluid collections.

The stomach and the small bowel loops are unremarkable.  The
appendix is normal.  The colon is unremarkable.

Review of the visualized osseous structures is significant for mild
degenerative disc disease.
IMPRESSION: 1.  Bilateral renal cysts.
2.  Several small renal hypodensities are too small to reliably
characterize.

## 2013-03-06 ENCOUNTER — Ambulatory Visit (INDEPENDENT_AMBULATORY_CARE_PROVIDER_SITE_OTHER): Payer: 59 | Admitting: Emergency Medicine

## 2013-03-06 ENCOUNTER — Encounter: Payer: Self-pay | Admitting: Emergency Medicine

## 2013-03-06 VITALS — BP 150/88 | HR 66 | Temp 98.2°F | Resp 16 | Ht 68.0 in | Wt 269.6 lb

## 2013-03-06 DIAGNOSIS — R7309 Other abnormal glucose: Secondary | ICD-10-CM

## 2013-03-06 DIAGNOSIS — E119 Type 2 diabetes mellitus without complications: Secondary | ICD-10-CM

## 2013-03-06 DIAGNOSIS — E785 Hyperlipidemia, unspecified: Secondary | ICD-10-CM

## 2013-03-06 DIAGNOSIS — I10 Essential (primary) hypertension: Secondary | ICD-10-CM

## 2013-03-06 DIAGNOSIS — R739 Hyperglycemia, unspecified: Secondary | ICD-10-CM

## 2013-03-06 DIAGNOSIS — Z0000 Encounter for general adult medical examination without abnormal findings: Secondary | ICD-10-CM

## 2013-03-06 LAB — CBC WITH DIFFERENTIAL/PLATELET
Basophils Absolute: 0 10*3/uL (ref 0.0–0.1)
HCT: 41.4 % (ref 39.0–52.0)
Hemoglobin: 14.5 g/dL (ref 13.0–17.0)
Lymphocytes Relative: 38 % (ref 12–46)
Lymphs Abs: 1.7 10*3/uL (ref 0.7–4.0)
MCV: 83.5 fL (ref 78.0–100.0)
Monocytes Absolute: 0.5 10*3/uL (ref 0.1–1.0)
Neutro Abs: 1.9 10*3/uL (ref 1.7–7.7)
RBC: 4.96 MIL/uL (ref 4.22–5.81)
RDW: 15 % (ref 11.5–15.5)
WBC: 4.4 10*3/uL (ref 4.0–10.5)

## 2013-03-06 LAB — POCT UA - MICROSCOPIC ONLY
Bacteria, U Microscopic: NEGATIVE
Casts, Ur, LPF, POC: NEGATIVE
Crystals, Ur, HPF, POC: NEGATIVE

## 2013-03-06 LAB — COMPREHENSIVE METABOLIC PANEL
ALT: 39 U/L (ref 0–53)
AST: 37 U/L (ref 0–37)
Albumin: 4.3 g/dL (ref 3.5–5.2)
BUN: 13 mg/dL (ref 6–23)
CO2: 27 mEq/L (ref 19–32)
Calcium: 10 mg/dL (ref 8.4–10.5)
Chloride: 106 mEq/L (ref 96–112)
Potassium: 4.2 mEq/L (ref 3.5–5.3)

## 2013-03-06 LAB — POCT GLYCOSYLATED HEMOGLOBIN (HGB A1C): Hemoglobin A1C: 6.2

## 2013-03-06 LAB — IFOBT (OCCULT BLOOD): IFOBT: NEGATIVE

## 2013-03-06 LAB — POCT URINALYSIS DIPSTICK
Blood, UA: NEGATIVE
Protein, UA: NEGATIVE
Spec Grav, UA: 1.025
Urobilinogen, UA: 0.2

## 2013-03-06 LAB — LIPID PANEL: Cholesterol: 111 mg/dL (ref 0–200)

## 2013-03-06 NOTE — Progress Notes (Signed)
  Subjective:    Patient ID: [name], male    DOB: 08/05/1946, 66 y.o.   MRN: [id number]  HPI    Review of Systems  Constitutional: Negative.   HENT: Negative.   Eyes: Negative.   Respiratory: Negative.   Cardiovascular: Negative.   Gastrointestinal: Negative.   Endocrine: Negative.   Genitourinary: Negative.   Musculoskeletal: Negative.   Skin: Negative.   Allergic/Immunologic: Negative.   Neurological: Negative.   Hematological: Negative.   Psychiatric/Behavioral: Negative.        Objective:   Physical Exam        Assessment & Plan:   

## 2013-03-06 NOTE — Progress Notes (Signed)
  Subjective:    Patient ID: [name], male    DOB: 1946-09-27, 67 y.o.   MRN: [id number]  HPI    Review of Systems  Constitutional: Negative.   HENT: Negative.   Eyes: Negative.   Respiratory: Negative.   Cardiovascular: Negative.   Gastrointestinal: Negative.   Endocrine: Negative.   Genitourinary: Negative.   Musculoskeletal: Negative.   Skin: Negative.   Allergic/Immunologic: Negative.   Neurological: Negative.   Hematological: Negative.   Psychiatric/Behavioral: Negative.        Objective:   Physical Exam        Assessment & Plan:

## 2013-03-06 NOTE — Progress Notes (Signed)
Subjective:    Patient ID: [name], male    DOB: Apr 11, 1946, 67 y.o.   MRN: [id number]  HPI 67 yo male here for annual physical exam.  Reports he is doing very well. Has not checked BP at home. Has not taken his BP meds yet today.  Is taking metformin currently. Checks sugars regularly, usually in 90's. Has been almost 10 years since last tetanus shot. Is up to date on pneumonia vaccine.  Had shingles vaccine recently.  Is due for colonoscopy next year.   Does not exercise much. Non-smoker. No alcohol. Is fasting today.  Had question about [employer] screening stroke risk assessment, which he can get a very good price on.     Review of Systems  Constitutional: Negative for fever, chills, fatigue and unexpected weight change.  Respiratory: Negative.   Cardiovascular: Negative.   Gastrointestinal: Negative.   Musculoskeletal: Positive for back pain (chronic, mild). Negative for myalgias and arthralgias.  Skin: Negative for rash.       Objective:   Physical Exam  Constitutional: He is oriented to person, place, and time. He appears well-developed and well-nourished.  HENT:  Head: Normocephalic and atraumatic.  Right Ear: External ear normal.  Left Ear: External ear normal.  Nose: Nose normal.  Mouth/Throat: Oropharynx is clear and moist.  Pale, bluish nasal turbinates  Eyes: Conjunctivae and EOM are normal. Pupils are equal, round, and reactive to light.  Neck: Normal range of motion. Neck supple.  Cardiovascular: Normal rate, regular rhythm, normal heart sounds and intact distal pulses.   Pulmonary/Chest: Effort normal and breath sounds normal.  Abdominal: Soft. Bowel sounds are normal.  Musculoskeletal:  Large scar at left humerus. Humerus is significantly shortened due to prior injury  Neurological: He is alert and oriented to person, place, and time.  Skin: Skin is warm and dry.  Psychiatric: He has a normal mood and affect.   rectal exam reveals a  normal-sized prostate without rectal masses stool will be checked for blood  Results for orders placed in visit on 03/06/13  POCT UA - MICROSCOPIC ONLY      Result Value Range   WBC, Ur, HPF, POC neg     RBC, urine, microscopic neg     Bacteria, U Microscopic neg     Mucus, UA neg     Epithelial cells, urine per micros neg     Crystals, Ur, HPF, POC neg     Casts, Ur, LPF, POC neg     Yeast, UA neg    POCT URINALYSIS DIPSTICK      Result Value Range   Color, UA yellow     Clarity, UA clear     Glucose, UA neg     Bilirubin, UA neg     Ketones, UA neg     Spec Grav, UA 1.025     Blood, UA neg     pH, UA 5.5     Protein, UA neg     Urobilinogen, UA 0.2     Nitrite, UA neg     Leukocytes, UA Negative     Results for orders placed in visit on 03/06/13  POCT UA - MICROSCOPIC ONLY      Result Value Range   WBC, Ur, HPF, POC neg     RBC, urine, microscopic neg     Bacteria, U Microscopic neg     Mucus, UA neg     Epithelial cells, urine per micros neg  Crystals, Ur, HPF, POC neg     Casts, Ur, LPF, POC neg     Yeast, UA neg    POCT URINALYSIS DIPSTICK      Result Value Range   Color, UA yellow     Clarity, UA clear     Glucose, UA neg     Bilirubin, UA neg     Ketones, UA neg     Spec Grav, UA 1.025     Blood, UA neg     pH, UA 5.5     Protein, UA neg     Urobilinogen, UA 0.2     Nitrite, UA neg     Leukocytes, UA Negative    GLUCOSE, POCT (MANUAL RESULT ENTRY)      Result Value Range   POC Glucose 118 (*) 70 - 99 mg/dl  POCT GLYCOSYLATED HEMOGLOBIN (HGB A1C)      Result Value Range   Hemoglobin A1C 6.2    IFOBT (OCCULT BLOOD)      Result Value Range   IFOBT Negative         Assessment & Plan:  Routine labs today along with EKG. He will be given a TDaP today. We'll check his A1c along with his routine labs. 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 She is going to check with the [facility] office in [place]. He will need  functional capacity evaluation of his upper extremities to see if he can appropriately handle a vehicle.

## 2013-03-07 LAB — MICROALBUMIN, URINE: Microalb, Ur: 0.73 mg/dL (ref 0.00–1.89)

## 2013-03-07 LAB — PSA: PSA: 0.78 ng/mL (ref <=4.00)

## 2013-05-09 ENCOUNTER — Encounter: Payer: Self-pay | Admitting: Cardiology

## 2013-05-09 ENCOUNTER — Ambulatory Visit (INDEPENDENT_AMBULATORY_CARE_PROVIDER_SITE_OTHER): Payer: 59 | Admitting: Cardiology

## 2013-05-09 VITALS — BP 140/80 | HR 59 | Ht 68.0 in | Wt 265.9 lb

## 2013-05-09 DIAGNOSIS — E785 Hyperlipidemia, unspecified: Secondary | ICD-10-CM

## 2013-05-09 DIAGNOSIS — I10 Essential (primary) hypertension: Secondary | ICD-10-CM

## 2013-05-09 DIAGNOSIS — E119 Type 2 diabetes mellitus without complications: Secondary | ICD-10-CM

## 2013-05-09 DIAGNOSIS — R7309 Other abnormal glucose: Secondary | ICD-10-CM

## 2013-05-09 DIAGNOSIS — R739 Hyperglycemia, unspecified: Secondary | ICD-10-CM

## 2013-05-09 DIAGNOSIS — E8881 Metabolic syndrome: Secondary | ICD-10-CM

## 2013-05-09 DIAGNOSIS — E669 Obesity, unspecified: Secondary | ICD-10-CM | POA: Insufficient documentation

## 2013-05-09 DIAGNOSIS — Z0271 Encounter for disability determination: Secondary | ICD-10-CM

## 2013-05-09 MED ORDER — LISINOPRIL 2.5 MG PO TABS: 2.5000 mg | ORAL_TABLET | Freq: Every day | ORAL | Status: DC

## 2013-05-09 NOTE — Patient Instructions (Addendum)
Your physician has recommended you make the following change in your medication start Lisinopril 2.5 mg on tablet a day.This is addition to the other medication.  You have been referred to nutritionist for diabetes and obesity.   Your physician wants you to follow-up in 12 months with Dr Herbie Baltimore. You will receive a reminder letter in the mail two months in advance. If you don't receive a letter, please call our office to schedule the follow-up appointment
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
## 2013-05-20 ENCOUNTER — Encounter: Payer: Self-pay | Admitting: Cardiology

## 2013-05-20 DIAGNOSIS — E8881 Metabolic syndrome: Secondary | ICD-10-CM | POA: Insufficient documentation

## 2013-05-20 NOTE — Assessment & Plan Note (Signed)
He is on simvastatin at 20 mg a day. He should be due for check by primary physician relatively soon.

## 2013-05-20 NOTE — Assessment & Plan Note (Signed)
Not as well-controlled as it like it to be 140/80 mmHg. Within the a diabetic he needs to be on ACE inhibitor so and start him on low-dose Lasix and lisinopril. This can be titrated up as physician if blood pressure tolerates.

## 2013-05-20 NOTE — Assessment & Plan Note (Signed)
Pretty much as noted above for metabolic syndrome. I congratulated him on his efforts for cardiac size but he really needs to worry about the dietary modifications well. He somewhat limited as far as exercise tolerance does most of his of his osteoporosis pains.

## 2013-05-20 NOTE — Assessment & Plan Note (Signed)
In order to avoid the full diagnoses of diabetes Beginning to lose weight and has yet to be diagnosed with full-blown diabetes. He was on metformin prophylactically due to metabolic syndrome.

## 2013-05-20 NOTE — Assessment & Plan Note (Signed)
I did reiterate to him that this combination of conditions is probably more a cemented the diabetes alone. Thankfully has been stable for cardiac standpoint but he he will he needs to work on the weight loss including exercise and dietary modification. In the thecal size of his lipids. H he says that his sugars have been doing relatively well, but his blood pressures were well-controlled.  Wall into the blood pressure in the sugars. Maybe he needs is to continue exercising but despite this he is going to lose weight without nutrition dietary assistance. I'm referring him to either the Margaret nutritionist at [facility] or if possible to [employer], RDN.

## 2013-05-20 NOTE — Assessment & Plan Note (Signed)
I plan to do her evaluation of this to his primary physician.

## 2013-06-04 ENCOUNTER — Other Ambulatory Visit: Payer: Self-pay | Admitting: Emergency Medicine

## 2013-06-06 NOTE — Telephone Encounter (Signed)
Needs office visit.

## 2013-06-07 ENCOUNTER — Other Ambulatory Visit: Payer: Self-pay | Admitting: Emergency Medicine

## 2013-06-25 ENCOUNTER — Encounter: Payer: Self-pay | Admitting: Dietician

## 2013-06-25 ENCOUNTER — Encounter: Payer: 59 | Attending: Cardiology | Admitting: Dietician

## 2013-06-25 VITALS — Ht 68.0 in | Wt 264.5 lb

## 2013-06-25 DIAGNOSIS — E669 Obesity, unspecified: Secondary | ICD-10-CM | POA: Insufficient documentation

## 2013-06-25 DIAGNOSIS — E8881 Metabolic syndrome: Secondary | ICD-10-CM | POA: Insufficient documentation

## 2013-06-25 DIAGNOSIS — R739 Hyperglycemia, unspecified: Secondary | ICD-10-CM

## 2013-06-25 DIAGNOSIS — Z713 Dietary counseling and surveillance: Secondary | ICD-10-CM | POA: Insufficient documentation

## 2013-06-25 NOTE — Progress Notes (Signed)
Appt start time: 0800 end time:  0900.  Assessment:  Patient was seen on  06/25/13 for individual diabetes education. [name] reports today with very good DM control based on recent HgbA1c readings, all in the Pre-DM range since 2012. He skips breakfast most days because his wife does all the cooking and she works later shifts, waking closer to 10-11 am. He is also limited in his physical activities by a number of surgeries related to an injury obtained at work, which are significant for reduced feeling in his left arm and hand. He also has some knee arthritis.  Current HbA1c: 6.2 MEDICATIONS: see list.   DIETARY INTAKE: Usual eating pattern includes 2 meals and 0-1 snacks per day. Everyday foods include variety of proteins.  Avoided foods include regular soda.    24-hr recall:  B ( AM): none Snk ( AM): none  L ( PM): usausage, egg, cheese; liverwurst and cheese; oatmeal and raisins, corned beef hash and eggs; occasional bacon; occasional salmon. Doesn't usually eat until about 11 am or noon. Wakes up at 6 or 7 am. Occasional OJ, diet soda, water.  Snk ( PM): none D ( PM): steak, fried chix, mac and cheese, black-eyed peas, asparagus, greens, potato salad, green beans. Pork chops, meatloaf, beef liver, pizza (frozen with toppings added at home), spaghetti with added greens and peppers in sauce. Diet coke, lemonade, iced tea (sweet). Pt seems to enjoy very large portions at this meal. Snk ( PM): very seldom will have a dessert or snack- single serving jell-o, very seldom ice cream, potato chips most likely Beverages: OJ, diet soda, water, sweet tea, lemonade, no EtOH  Usual physical activity: Pt claims hardly any at all. Most activity is cutting grass and light cleaning in the house. Has a stairmaster at home but does not use it. He says he enjoys walking outside and feels like he accomplishes more when he walks outside than in the home.   Progress Towards Goal(s):  In  progress.   Nutritional Diagnosis:  NI-5.8.2 Excessive carbohydrate intake As related to DM, low physical activity.  As evidenced by pt diet recall showing large portions of black-eyed peas, mac and cheese, sweet tea.    Intervention:  Nutrition counseling provided.  Discussed diabetes disease process and treatment options.  Discussed physiology of diabetes and role of obesity on insulin resistance.  Encouraged moderate weight reduction to improve glucose levels.  Discussed role of medications and diet in glucose control  Provided education on macronutrients on glucose levels.  Provided education on carb counting, importance of regularly scheduled meals/snacks, and meal planning  Discussed effects of physical activity on glucose levels and long-term glucose control.  Recommended 150 minutes of physical activity/week.  Reviewed patient medications.  Discussed role of medication on blood glucose and possible side effects  Discussed blood glucose monitoring and interpretation.  Discussed recommended target ranges and individual ranges.    Described short-term complications: hyper- and hypo-glycemia.  Discussed causes,symptoms, and treatment options.  Discussed prevention, detection, and treatment of long-term complications.  Discussed the role of prolonged elevated glucose levels on body systems.  Discussed role of stress on blood glucose levels and discussed strategies to manage psychosocial issues.  Discussed recommendations for long-term diabetes self-care.  Established checklist for medical, dental, and emotional self-care.  Handouts given during visit include:  Diabetes Basics  Diabetes Food Plan  The Plate Method  The Plate Method was clearly understood better than CHO counting by [name]. However, he did understand  the concept of limiting CHO-containing foods well, and states he will be able to alter his sweet drinks to use a non-nutritive sweetener. He was also able to  identify starchy versus non-starchy vegetables well. He is very amenable to eating something small for breakfast, specifically a protein food like a yogurt, egg, or milk. He is also very open to walking while his wife is still asleep in the morning.  Barriers to learning/adherance to lifestyle change: Hx of injuries and knee arthritis limits physical activity, some difficulty with CHO counting.   Diabetes self-care support plan:   [facility] support group  The Plate Method will be shared with his wife, who does the vast majority of the cooking.  Monitoring/Evaluation:  Dietary intake, exercise, CHO control, and body weight in 3 month(s). Will F/U after next MD appointment on 09/11/13 to see an updated HgbA1c.

## 2013-07-07 ENCOUNTER — Other Ambulatory Visit: Payer: Self-pay | Admitting: Physician Assistant

## 2013-07-09 ENCOUNTER — Telehealth: Payer: Self-pay

## 2013-07-09 MED ORDER — BISOPROLOL-HYDROCHLOROTHIAZIDE 10-6.25 MG PO TABS: ORAL_TABLET | ORAL | Status: DC

## 2013-07-09 MED ORDER — METFORMIN HCL 500 MG PO TABS: ORAL_TABLET | ORAL | Status: DC

## 2013-07-09 NOTE — Telephone Encounter (Signed)
Was sent to [facility]. Friday, called him to advise. Resent he states they did not go.

## 2013-07-09 NOTE — Telephone Encounter (Signed)
PT STATES THE PHARMACY HAVE BEEN TRYING SINCE Friday TO HAVE HIS METFORMIN REFILLED AND HASN'T HEARD FROM ANYONE. PLEASE CALL [phone]   [facility] ON [address]

## 2013-08-24 ENCOUNTER — Other Ambulatory Visit: Payer: Self-pay | Admitting: Emergency Medicine

## 2013-08-28 ENCOUNTER — Telehealth: Payer: Self-pay

## 2013-08-28 MED ORDER — BISOPROLOL-HYDROCHLOROTHIAZIDE 10-6.25 MG PO TABS: ORAL_TABLET | ORAL | Status: DC

## 2013-08-28 MED ORDER — SIMVASTATIN 40 MG PO TABS: 20.0000 mg | ORAL_TABLET | Freq: Every day | ORAL | Status: DC

## 2013-08-28 NOTE — Telephone Encounter (Signed)
PT WOULD LIKE TO KNOW IF DR DAUB WANT HIM TO CONTINUE TAKING THE SIMVASTATIN OR NOT. PLEASE CALL [phone] HE NEED A REFILL IF SO   [facility] [address]

## 2013-08-28 NOTE — Telephone Encounter (Signed)
Yes, but he needs office visit. He has one scheduled. meds sent.

## 2013-09-06 ENCOUNTER — Other Ambulatory Visit: Payer: Self-pay | Admitting: Physician Assistant

## 2013-09-11 ENCOUNTER — Encounter: Payer: Self-pay | Admitting: Emergency Medicine

## 2013-09-11 ENCOUNTER — Ambulatory Visit (INDEPENDENT_AMBULATORY_CARE_PROVIDER_SITE_OTHER): Payer: 59 | Admitting: Emergency Medicine

## 2013-09-11 VITALS — BP 130/78 | HR 58 | Temp 97.8°F | Resp 18 | Ht 68.0 in | Wt 251.0 lb

## 2013-09-11 DIAGNOSIS — Z23 Encounter for immunization: Secondary | ICD-10-CM

## 2013-09-11 DIAGNOSIS — R739 Hyperglycemia, unspecified: Secondary | ICD-10-CM

## 2013-09-11 DIAGNOSIS — I10 Essential (primary) hypertension: Secondary | ICD-10-CM

## 2013-09-11 DIAGNOSIS — E785 Hyperlipidemia, unspecified: Secondary | ICD-10-CM

## 2013-09-11 DIAGNOSIS — E119 Type 2 diabetes mellitus without complications: Secondary | ICD-10-CM

## 2013-09-11 DIAGNOSIS — J45909 Unspecified asthma, uncomplicated: Secondary | ICD-10-CM

## 2013-09-11 LAB — LIPID PANEL
HDL: 33 mg/dL — ABNORMAL LOW (ref >39)
LDL Cholesterol: 49 mg/dL (ref 0–99)
Total CHOL/HDL Ratio: 3.2 Ratio
VLDL: 25 mg/dL (ref 0–40)

## 2013-09-11 LAB — COMPREHENSIVE METABOLIC PANEL
AST: 28 U/L (ref 0–37)
Alkaline Phosphatase: 69 U/L (ref 39–117)
BUN: 14 mg/dL (ref 6–23)
Creat: 1.28 mg/dL (ref 0.50–1.35)
Potassium: 4.2 mEq/L (ref 3.5–5.3)
Total Bilirubin: 0.5 mg/dL (ref 0.3–1.2)

## 2013-09-11 LAB — GLUCOSE, POCT (MANUAL RESULT ENTRY): POC Glucose: 118 mg/dl — AB (ref 70–99)

## 2013-09-11 MED ORDER — LISINOPRIL 2.5 MG PO TABS: 2.5000 mg | ORAL_TABLET | Freq: Every day | ORAL | Status: DC

## 2013-09-11 MED ORDER — METFORMIN HCL 500 MG PO TABS: ORAL_TABLET | ORAL | Status: DC

## 2013-09-11 MED ORDER — BISOPROLOL-HYDROCHLOROTHIAZIDE 10-6.25 MG PO TABS: ORAL_TABLET | ORAL | Status: DC

## 2013-09-11 MED ORDER — ALBUTEROL SULFATE HFA 108 (90 BASE) MCG/ACT IN AERS: 2.0000 | INHALATION_SPRAY | RESPIRATORY_TRACT | Status: DC | PRN

## 2013-09-11 MED ORDER — SIMVASTATIN 40 MG PO TABS: 20.0000 mg | ORAL_TABLET | Freq: Every day | ORAL | Status: DC

## 2013-09-11 NOTE — Progress Notes (Signed)
  Subjective:    Patient ID: [name], male    DOB: 03/08/1946, 67 y.o.   MRN: [id number]  HPI patient in followup metabolic syndrome. He is currently under the care of cardiologist. He is on medications for control of his blood pressure and sugar. He also uses an albuterol inhaler on when necessary basis.    Review of Systems     Objective:   Physical Exam he is alert and cooperative today. Neck is supple. Chest clear heart regular rate no murmurs abdomen obese without tenderness extremity exam reveals slight decreased sensation normal pulses no swelling .  Results for orders placed in visit on 09/11/13  GLUCOSE, POCT (MANUAL RESULT ENTRY)      Result Value Range   POC Glucose 118 (*) 70 - 99 mg/dl  POCT GLYCOSYLATED HEMOGLOBIN (HGB A1C)      Result Value Range   Hemoglobin A1C 5.5          Assessment & Plan:    Sugar at goal. Flu shot given. No changes in the medications he was given one year prescriptions followup in 4 months

## 2013-09-24 ENCOUNTER — Encounter: Payer: 59 | Attending: Cardiology | Admitting: Dietician

## 2013-09-24 ENCOUNTER — Encounter: Payer: Self-pay | Admitting: Dietician

## 2013-09-24 VITALS — Wt 251.8 lb

## 2013-09-24 DIAGNOSIS — E8881 Metabolic syndrome: Secondary | ICD-10-CM | POA: Insufficient documentation

## 2013-09-24 DIAGNOSIS — Z713 Dietary counseling and surveillance: Secondary | ICD-10-CM | POA: Insufficient documentation

## 2013-09-24 DIAGNOSIS — E669 Obesity, unspecified: Secondary | ICD-10-CM | POA: Insufficient documentation

## 2013-09-24 NOTE — Progress Notes (Signed)
A: Pt here for Metabolic Syndrome F/U. He has shown fantastic improvements in terms of wt (down 13 lbs), TG (down >25 mg/dL), BP (down 20 mmHg systole) readings. Exercise on treadmill 20-25 min every other day and walks around the block (30-45 minutes) every other day. He is looking to improve upon this pace to walk on treadmill every day.   Pt states his wife has taken to cooking according to the Plate Method. Pt is still a bit confused on concept of starchy vegetables, but his total portion control is better, with less quantity of total food. He notes a significant increase in vegetable intake, although he does tend to eat a good amount of canned veg. He has also significantly cut back on fattier meats, and eats more fish lately.  I: RD reinforced positive behaviors pt has adopted since last visit. RD discussed with pt the possibility of rinsing canned veg and/or buying more frozen options to reduce sodium. Rd encouraged pt to increase exercise up to daily as the pt states he hopes to do. RD discussed concept of starchy veg in greater detail, pt displayed understanding via teach back method.   M/E: Monitor body weight, HgbA1c, blood lipids, portion control, exercise. Pt is concerned about expenses, and wishes to F/U prn.

## 2013-10-12 ENCOUNTER — Other Ambulatory Visit: Payer: Self-pay | Admitting: Physician Assistant

## 2013-12-18 ENCOUNTER — Ambulatory Visit (INDEPENDENT_AMBULATORY_CARE_PROVIDER_SITE_OTHER): Payer: 59 | Admitting: Emergency Medicine

## 2013-12-18 VITALS — BP 123/73 | HR 58 | Temp 98.3°F | Resp 16 | Ht 68.25 in | Wt 252.0 lb

## 2013-12-18 DIAGNOSIS — R21 Rash and other nonspecific skin eruption: Secondary | ICD-10-CM

## 2013-12-18 DIAGNOSIS — E119 Type 2 diabetes mellitus without complications: Secondary | ICD-10-CM

## 2013-12-18 LAB — POCT SKIN KOH: SKIN KOH, POC: NEGATIVE

## 2013-12-18 LAB — GLUCOSE, POCT (MANUAL RESULT ENTRY): POC Glucose: 137 mg/dl — AB (ref 70–99)

## 2013-12-18 LAB — POCT GLYCOSYLATED HEMOGLOBIN (HGB A1C): HEMOGLOBIN A1C: 5.8

## 2013-12-18 MED ORDER — BETAMETHASONE DIPROPIONATE AUG 0.05 % EX CREA: TOPICAL_CREAM | Freq: Two times a day (BID) | CUTANEOUS | Status: DC

## 2013-12-18 NOTE — Progress Notes (Addendum)
Subjective:    Patient ID: [name], male    DOB: April 26, 1946, 68 y.o.   MRN: [id number] This chart was scribed for [name], MD by Rolanda Lundborg, ED Scribe. This patient was seen in room 23 and the patient's care was started at 8:38 AM.  Chief Complaint  Patient presents with  . Follow-up    DM   HPI HPI Comments: [name] is a 68 y.o. male who presents to the [facility] for a follow-up on DM. Pt states he has been feeling pretty good. He had his flu shot this year. He reports a constant, itchy rash to his left palm. He has been using alcohol and hydrocortisone cream on the rash with no relief.  His granddaughter is getting ready to have a baby. His granddaughter that was in the NICU is healthy now.     Past Medical History  Diagnosis Date  . Arthritis   . Asthma   . Blood transfusion without reported diagnosis     2000  . Diabetes mellitus without complication   . Hypertension   . Dyslipidemia (high LDL; low HDL)   . Allergy   . Ulcer     Normal ankle-brachial reflex  . Obesity, Class III, BMI 40-49.9 (morbid obesity)     BMI 40  . Carotid atherosclerosis     Nonocclusive by Dopplers.   Current Outpatient Prescriptions on File Prior to Visit  Medication Sig Dispense Refill  . albuterol (PROVENTIL HFA;VENTOLIN HFA) 108 (90 BASE) MCG/ACT inhaler Inhale 2 puffs into the lungs every 4 (four) hours as needed for wheezing (cough, shortness of breath or wheezing.).  1 Inhaler  1  . albuterol (PROVENTIL) (2.5 MG/3ML) 0.083% nebulizer solution Take 2.5 mg by nebulization every 6 (six) hours as needed.      Marland Kitchen aspirin 81 MG tablet Take 81 mg by mouth daily.      . bisoprolol-hydrochlorothiazide (ZIAC) 10-6.25 MG per tablet TAKE 1 TABLET BY MOUTH TWICE DAILY  180 tablet  11  . bisoprolol-hydrochlorothiazide (ZIAC) 10-6.25 MG per tablet TAKE 1 TABLET BY MOUTH TWICE DAILY  180 tablet  3  . lisinopril (PRINIVIL,ZESTRIL) 2.5 MG tablet Take 1  tablet (2.5 mg total) by mouth daily.  90 tablet  3  . loratadine (CLARITIN) 10 MG tablet Take 10 mg by mouth daily.      . metFORMIN (GLUCOPHAGE) 500 MG tablet TAKE 1 TABLET BY MOUTH TWICE DAILY WITH MEALS  60 tablet  0  . metFORMIN (GLUCOPHAGE) 500 MG tablet TAKE 1 TABLET BY MOUTH TWICE DAILY WITH MEALS  180 tablet  3  . Misc Natural Products (OSTEO BI-FLEX TRIPLE STRENGTH) TABS Take by mouth.      . Multiple Vitamin (MULTIVITAMIN) capsule Take 1 capsule by mouth daily.      . simvastatin (ZOCOR) 40 MG tablet Take 0.5 tablets (20 mg total) by mouth at bedtime.  90 tablet  3   No current facility-administered medications on file prior to visit.   No Known Allergies    Review of Systems  Constitutional: Negative for fatigue and unexpected weight change.  Eyes: Negative for visual disturbance.  Respiratory: Negative for cough, chest tightness and shortness of breath.   Cardiovascular: Negative for chest pain, palpitations and leg swelling.  Gastrointestinal: Negative for abdominal pain and blood in stool.  Skin: Positive for rash.  Neurological: Negative for dizziness, light-headedness and headaches.       Objective:   Physical  Exam CONSTITUTIONAL: Well developed/well nourished HEAD: Normocephalic/atraumatic EYES: EOMI/PERRL ENMT: Mucous membranes moist NECK: supple no meningeal signs SPINE:entire spine nontender CV: S1/S2 noted, no murmurs/rubs/gallops noted LUNGS: Lungs are clear to auscultation bilaterally, no apparent distress ABDOMEN: soft, nontender, no rebound or guarding GU:no cva tenderness NEURO: Pt is awake/alert, moves all extremitiesx4 EXTREMITIES: pulses normal, full ROM. deformity of the left arm. SKIN: warm, color normal. Scaly rash on the palm of left hand and bottoms of both feet. PSYCH: no abnormalities of mood noted   Filed Vitals:   12/18/13 0844  BP: 123/73  Pulse: 58  Temp: 98.3 F (36.8 C)  Resp: 16  Height: 5' 8.25" (1.734 m)  Weight: 252 lb  (114.306 kg)  SpO2: 96%   Results for orders placed in visit on 12/18/13  GLUCOSE, POCT (MANUAL RESULT ENTRY)      Result Value Range   POC Glucose 137 (*) 70 - 99 mg/dl  POCT GLYCOSYLATED HEMOGLOBIN (HGB A1C)      Result Value Range   Hemoglobin A1C 5.8    POCT SKIN KOH      Result Value Range   Skin KOH, POC Negative          Assessment & Plan:   1. Diabetes    patient stable at present. No change in current medications.   I personally performed the services described in this documentation, which was scribed in my presence. The recorded information has been reviewed and is accurate.

## 2014-04-30 ENCOUNTER — Ambulatory Visit (INDEPENDENT_AMBULATORY_CARE_PROVIDER_SITE_OTHER): Payer: 59 | Admitting: Emergency Medicine

## 2014-04-30 ENCOUNTER — Encounter: Payer: Self-pay | Admitting: Emergency Medicine

## 2014-04-30 VITALS — BP 124/71 | HR 59 | Temp 98.1°F | Resp 16 | Ht 68.0 in | Wt 246.0 lb

## 2014-04-30 DIAGNOSIS — E8881 Metabolic syndrome: Secondary | ICD-10-CM

## 2014-04-30 DIAGNOSIS — Z86010 Personal history of colonic polyps: Secondary | ICD-10-CM

## 2014-04-30 DIAGNOSIS — E7800 Pure hypercholesterolemia, unspecified: Secondary | ICD-10-CM

## 2014-04-30 DIAGNOSIS — Z125 Encounter for screening for malignant neoplasm of prostate: Secondary | ICD-10-CM

## 2014-04-30 DIAGNOSIS — I10 Essential (primary) hypertension: Secondary | ICD-10-CM

## 2014-04-30 DIAGNOSIS — E119 Type 2 diabetes mellitus without complications: Secondary | ICD-10-CM

## 2014-04-30 LAB — LIPID PANEL
CHOLESTEROL: 103 mg/dL (ref 0–200)
HDL: 29 mg/dL — ABNORMAL LOW (ref >39)
LDL Cholesterol: 52 mg/dL (ref 0–99)
TRIGLYCERIDES: 111 mg/dL (ref <150)
Total CHOL/HDL Ratio: 3.6 Ratio
VLDL: 22 mg/dL (ref 0–40)

## 2014-04-30 LAB — POCT URINALYSIS DIPSTICK
BILIRUBIN UA: NEGATIVE
Glucose, UA: NEGATIVE
Ketones, UA: NEGATIVE
Leukocytes, UA: NEGATIVE
Nitrite, UA: NEGATIVE
PH UA: 5
Protein, UA: NEGATIVE
RBC UA: NEGATIVE
Spec Grav, UA: 1.025
UROBILINOGEN UA: 0.2

## 2014-04-30 LAB — COMPLETE METABOLIC PANEL WITH GFR
ALT: 24 U/L (ref 0–53)
AST: 22 U/L (ref 0–37)
Albumin: 4.4 g/dL (ref 3.5–5.2)
Alkaline Phosphatase: 68 U/L (ref 39–117)
BUN: 13 mg/dL (ref 6–23)
CALCIUM: 9.6 mg/dL (ref 8.4–10.5)
CO2: 24 meq/L (ref 19–32)
CREATININE: 1.12 mg/dL (ref 0.50–1.35)
Chloride: 107 mEq/L (ref 96–112)
GFR, EST AFRICAN AMERICAN: 77 mL/min
GFR, EST NON AFRICAN AMERICAN: 67 mL/min
Glucose, Bld: 112 mg/dL — ABNORMAL HIGH (ref 70–99)
Potassium: 4.2 mEq/L (ref 3.5–5.3)
Sodium: 140 mEq/L (ref 135–145)
Total Bilirubin: 0.5 mg/dL (ref 0.2–1.2)
Total Protein: 6.7 g/dL (ref 6.0–8.3)

## 2014-04-30 LAB — GLUCOSE, POCT (MANUAL RESULT ENTRY): POC GLUCOSE: 122 mg/dL — AB (ref 70–99)

## 2014-04-30 LAB — MICROALBUMIN, URINE: Microalb, Ur: 1 mg/dL (ref 0.00–1.89)

## 2014-04-30 LAB — POCT GLYCOSYLATED HEMOGLOBIN (HGB A1C): Hemoglobin A1C: 5.8
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
## 2014-05-01 LAB — PSA, MEDICARE: PSA: 0.9 ng/mL (ref <=4.00)

## 2014-05-08 ENCOUNTER — Ambulatory Visit: Payer: 59 | Admitting: Cardiology

## 2014-05-13 ENCOUNTER — Telehealth: Payer: Self-pay

## 2014-05-13 NOTE — Telephone Encounter (Signed)
PT STATES HE NEED TO HAVE SOME DIABETIC SCRIPTS CALLED IN FOR HIM, WASN'T ABLE TO CALL FOR A REFILL BECAUSE HE NEVER HAD IT BEFORE PLEASE CALL [place] AND [address]

## 2014-05-14 MED ORDER — METFORMIN HCL 500 MG PO TABS: ORAL_TABLET | ORAL | Status: DC

## 2014-05-14 MED ORDER — FREESTYLE FREEDOM LITE W/DEVICE KIT: 1.0000 | PACK | Freq: Every day | Status: DC

## 2014-05-15 ENCOUNTER — Telehealth: Payer: Self-pay

## 2014-05-15 MED ORDER — GLUCOSE BLOOD VI STRP: ORAL_STRIP | Status: DC

## 2014-05-15 NOTE — Telephone Encounter (Signed)
Pt called in and he needs a RX for diabetic strips. He was just here but forgot to get it refilled by Dr Everlene Farrier. He can be reached [phone]. Thank you

## 2014-05-15 NOTE — Telephone Encounter (Signed)
Sent in refills for pt 

## 2014-05-16 ENCOUNTER — Encounter: Payer: Self-pay | Admitting: *Deleted

## 2014-05-17 ENCOUNTER — Telehealth: Payer: Self-pay

## 2014-05-17 MED ORDER — BLOOD GLUCOSE METER KIT: PACK | Status: DC

## 2014-05-17 MED ORDER — GLUCOSE BLOOD VI STRP: ORAL_STRIP | Status: DC

## 2014-05-17 MED ORDER — LANCETS MISC: Status: AC

## 2014-05-17 NOTE — Telephone Encounter (Signed)
Fax from [facility] that Freestyle meters are not covered by ins, and asked for another script for what is covered by ins. I will send in generic scripts to be filled w/pt and ins choice.

## 2014-05-27 ENCOUNTER — Encounter: Payer: Self-pay | Admitting: Cardiology

## 2014-05-27 ENCOUNTER — Encounter: Payer: Self-pay | Admitting: Emergency Medicine

## 2014-05-27 ENCOUNTER — Ambulatory Visit (INDEPENDENT_AMBULATORY_CARE_PROVIDER_SITE_OTHER): Payer: 59 | Admitting: Cardiology

## 2014-05-27 VITALS — BP 122/72 | HR 49 | Ht 68.0 in | Wt 241.0 lb

## 2014-05-27 DIAGNOSIS — E785 Hyperlipidemia, unspecified: Secondary | ICD-10-CM

## 2014-05-27 DIAGNOSIS — Z79899 Other long term (current) drug therapy: Secondary | ICD-10-CM

## 2014-05-27 DIAGNOSIS — R7309 Other abnormal glucose: Secondary | ICD-10-CM

## 2014-05-27 DIAGNOSIS — I10 Essential (primary) hypertension: Secondary | ICD-10-CM

## 2014-05-27 DIAGNOSIS — R739 Hyperglycemia, unspecified: Secondary | ICD-10-CM

## 2014-05-27 DIAGNOSIS — E8881 Metabolic syndrome: Secondary | ICD-10-CM

## 2014-05-27 MED ORDER — LISINOPRIL 2.5 MG PO TABS: 2.5000 mg | ORAL_TABLET | Freq: Every day | ORAL | Status: DC

## 2014-05-27 NOTE — Patient Instructions (Signed)
Lisinopril 2.5 mg  Take at bedtime.  Take Ziac  In morning.  LABS IN 2 WEEKS AND HAVE BLOOD PRESSURE CHECK WITH NURSE  Your physician recommends that you schedule a follow-up appointment in [place] wants you to follow-up in 12 MONTHS Dr Ellyn Hack.  You will receive a reminder letter in the mail two months in advance. If you don't receive a letter, please call our office to schedule the follow-up appointment.

## 2014-05-29 ENCOUNTER — Encounter: Payer: Self-pay | Admitting: Cardiology

## 2014-05-29 NOTE — Assessment & Plan Note (Signed)
With the combination of the above symptoms, he does meet the criteria for metabolic syndrome and therefore is at increased cardiovascular risk. We are actively working on improving his lipid control as well as glycemic control. Blood pressure is well controlled.

## 2014-05-29 NOTE — Assessment & Plan Note (Addendum)
Very well-controlled on current medications. Unfortunately, he never called in to get refills for his ACE inhibitor. I'll restart him on low-dose lisinopril 2.5 mg daily in the evening and decrease his bisoprolol HCTZ to once a day in the morning. Hopefully this will cause his blood pressure not to drop too much.  With addition of ACE inhibitor, will check being that in blood pressure with the nurse in 2 weeks

## 2014-05-29 NOTE — Assessment & Plan Note (Signed)
That his last one year visit, his BMI was over 40, and is now 39. He is definitely lost weight. I congratulated his effort. Continue  to counselon dietary modification and increasing exercise level.

## 2014-05-29 NOTE — Assessment & Plan Note (Signed)
He is currently on simvastatin. The only major way for him to improve his HDL is exercise. He is actively trying to lose weight by Dr. I., hopefully as he loses more weight he will be able to increase his exercise level. I don't think that niacin would be a very effective option
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
## 2014-05-29 NOTE — Assessment & Plan Note (Addendum)
Likely glucose intolerance. Started on metformin for improved glycemic control

## 2014-05-31 ENCOUNTER — Encounter: Payer: Self-pay | Admitting: Emergency Medicine

## 2014-06-10 ENCOUNTER — Ambulatory Visit (INDEPENDENT_AMBULATORY_CARE_PROVIDER_SITE_OTHER): Payer: 59

## 2014-06-10 VITALS — BP 118/73 | HR 51 | Ht 68.0 in | Wt 243.1 lb

## 2014-06-10 DIAGNOSIS — I10 Essential (primary) hypertension: Secondary | ICD-10-CM

## 2014-06-10 LAB — BASIC METABOLIC PANEL
BUN: 14 mg/dL (ref 6–23)
CHLORIDE: 106 meq/L (ref 96–112)
CO2: 24 mEq/L (ref 19–32)
Calcium: 9.4 mg/dL (ref 8.4–10.5)
Creat: 1.15 mg/dL (ref 0.50–1.35)
GLUCOSE: 95 mg/dL (ref 70–99)
Potassium: 4.4 mEq/L (ref 3.5–5.3)
Sodium: 141 mEq/L (ref 135–145)

## 2014-06-10 NOTE — Progress Notes (Signed)
1.) Reason for visit: B/P check  2.) Name of MD requesting visit: Dr.Harding  3.) H&P: Medication was changed at last office visit  4.) ROS related to problem: No complaints  5.) Assessment and plan per MD:

## 2014-06-10 NOTE — Patient Instructions (Signed)
Your physician recommends that you schedule a follow-up appointment as previously scheduled. No changes were made in your therapy today. Continue all medications as directed.

## 2014-06-12 ENCOUNTER — Telehealth: Payer: Self-pay | Admitting: *Deleted

## 2014-06-12 NOTE — Telephone Encounter (Signed)
Spoke to patient. Result given . Verbalized understanding  

## 2014-06-12 NOTE — Telephone Encounter (Signed)
Message copied by Raiford Simmonds on Wed Jun 12, 2014  1:12 PM ------      Message from: Leonie Man      Created: Tue Jun 11, 2014  1:53 PM       Labs look good.      Leonie Man, MD       ------

## 2014-09-05 ENCOUNTER — Encounter: Payer: 59 | Admitting: Emergency Medicine

## 2014-09-10 ENCOUNTER — Other Ambulatory Visit: Payer: Self-pay | Admitting: Physician Assistant

## 2014-09-13 ENCOUNTER — Ambulatory Visit (INDEPENDENT_AMBULATORY_CARE_PROVIDER_SITE_OTHER): Payer: 59 | Admitting: Emergency Medicine

## 2014-09-13 ENCOUNTER — Encounter: Payer: Self-pay | Admitting: Emergency Medicine

## 2014-09-13 VITALS — BP 140/74 | HR 60 | Temp 97.5°F | Resp 16 | Ht 68.0 in | Wt 240.0 lb

## 2014-09-13 DIAGNOSIS — E785 Hyperlipidemia, unspecified: Secondary | ICD-10-CM

## 2014-09-13 DIAGNOSIS — Z23 Encounter for immunization: Secondary | ICD-10-CM

## 2014-09-13 DIAGNOSIS — I10 Essential (primary) hypertension: Secondary | ICD-10-CM

## 2014-09-13 DIAGNOSIS — J45909 Unspecified asthma, uncomplicated: Secondary | ICD-10-CM

## 2014-09-13 DIAGNOSIS — E119 Type 2 diabetes mellitus without complications: Secondary | ICD-10-CM

## 2014-09-13 DIAGNOSIS — Z125 Encounter for screening for malignant neoplasm of prostate: Secondary | ICD-10-CM

## 2014-09-13 DIAGNOSIS — R079 Chest pain, unspecified: Secondary | ICD-10-CM

## 2014-09-13 DIAGNOSIS — Z0000 Encounter for general adult medical examination without abnormal findings: Secondary | ICD-10-CM

## 2014-09-13 MED ORDER — ALBUTEROL SULFATE HFA 108 (90 BASE) MCG/ACT IN AERS: 2.0000 | INHALATION_SPRAY | RESPIRATORY_TRACT | Status: DC | PRN

## 2014-09-13 MED ORDER — NITROGLYCERIN 0.4 MG SL SUBL: SUBLINGUAL_TABLET | SUBLINGUAL | Status: DC

## 2014-09-13 NOTE — Progress Notes (Deleted)
Subjective:    [name] is a 68 y.o. male who presents for Medicare Annual/Subsequent preventive examination.   Preventive Screening-Counseling & Management  Tobacco History  Smoking status  . Former Smoker -- 0.25 packs/day for 16 years  . Types: Cigarettes  . Start date: 06/25/1957  . Quit date: 06/25/1973  Smokeless tobacco  . Never Used    Problems Prior to Visit 1.   Current Problems (verified) Patient Active Problem List   Diagnosis Date Noted  . Metabolic syndrome 91/69/4503  . Obesity, Class III, BMI 40-49.9 (morbid obesity)   . Essential hypertension 10/27/2011  . Osteoarthritis 10/27/2011  . Disability examination 10/27/2011  . Hyperglycemia 10/27/2011  . Atherogenic dyslipidemia; low HDL, in setting of metabolic syndrome 88/82/8003  . Renal cysts, acquired, bilateral 10/27/2011    Medications Prior to Visit Current Outpatient Prescriptions on File Prior to Visit  Medication Sig Dispense Refill  . albuterol (PROVENTIL HFA;VENTOLIN HFA) 108 (90 BASE) MCG/ACT inhaler Inhale 2 puffs into the lungs every 4 (four) hours as needed for wheezing (cough, shortness of breath or wheezing.).  1 Inhaler  1  . albuterol (PROVENTIL) (2.5 MG/3ML) 0.083% nebulizer solution Take 2.5 mg by nebulization every 6 (six) hours as needed.      Marland Kitchen aspirin 81 MG tablet Take 81 mg by mouth daily.      Marland Kitchen augmented betamethasone dipropionate (DIPROLENE AF) 0.05 % cream Apply topically 2 (two) times daily.  50 g  4  . bisoprolol-hydrochlorothiazide (ZIAC) 10-6.25 MG per tablet Take 1 tablet by mouth daily.      . Blood Glucose Monitoring Suppl (BLOOD GLUCOSE METER KIT AND SUPPLIES) Test blood sugar daily. Dx code: 250.00  1 each  0  . glucose blood test strip Test blood sugar daily. Dx code: 250.00  100 each  3  . Lancets MISC Test blood sugar daily. Dx code: 250.00  100 each  3  . lisinopril (PRINIVIL,ZESTRIL) 2.5 MG tablet Take 1 tablet (2.5 mg total) by mouth daily.  30 tablet  11   . loratadine (CLARITIN) 10 MG tablet Take 10 mg by mouth daily as needed.       . metFORMIN (GLUCOPHAGE) 500 MG tablet TAKE 1 TABLET BY MOUTH TWICE DAILY WITH MEALS  60 tablet  1  . Misc Natural Products (OSTEO BI-FLEX TRIPLE STRENGTH) TABS Take by mouth.      . Multiple Vitamin (MULTIVITAMIN) capsule Take 1 capsule by mouth daily.      . simvastatin (ZOCOR) 40 MG tablet Take 0.5 tablets (20 mg total) by mouth at bedtime.  90 tablet  3   No current facility-administered medications on file prior to visit.    Current Medications (verified) Current Outpatient Prescriptions  Medication Sig Dispense Refill  . albuterol (PROVENTIL HFA;VENTOLIN HFA) 108 (90 BASE) MCG/ACT inhaler Inhale 2 puffs into the lungs every 4 (four) hours as needed for wheezing (cough, shortness of breath or wheezing.).  1 Inhaler  1  . albuterol (PROVENTIL) (2.5 MG/3ML) 0.083% nebulizer solution Take 2.5 mg by nebulization every 6 (six) hours as needed.      Marland Kitchen aspirin 81 MG tablet Take 81 mg by mouth daily.      Marland Kitchen augmented betamethasone dipropionate (DIPROLENE AF) 0.05 % cream Apply topically 2 (two) times daily.  50 g  4  . bisoprolol-hydrochlorothiazide (ZIAC) 10-6.25 MG per tablet Take 1 tablet by mouth daily.      . Blood Glucose Monitoring Suppl (BLOOD GLUCOSE METER KIT AND SUPPLIES) Test  blood sugar daily. Dx code: 250.00  1 each  0  . glucose blood test strip Test blood sugar daily. Dx code: 250.00  100 each  3  . Lancets MISC Test blood sugar daily. Dx code: 250.00  100 each  3  . lisinopril (PRINIVIL,ZESTRIL) 2.5 MG tablet Take 1 tablet (2.5 mg total) by mouth daily.  30 tablet  11  . loratadine (CLARITIN) 10 MG tablet Take 10 mg by mouth daily as needed.       . metFORMIN (GLUCOPHAGE) 500 MG tablet TAKE 1 TABLET BY MOUTH TWICE DAILY WITH MEALS  60 tablet  1  . Misc Natural Products (OSTEO BI-FLEX TRIPLE STRENGTH) TABS Take by mouth.      . Multiple Vitamin (MULTIVITAMIN) capsule Take 1 capsule by mouth daily.       . simvastatin (ZOCOR) 40 MG tablet Take 0.5 tablets (20 mg total) by mouth at bedtime.  90 tablet  3   No current facility-administered medications for this visit.     Allergies (verified) Review of patient's allergies indicates not on file.   PAST HISTORY  Family History Family History  Problem Relation Age of Onset  . Stroke Mother   . Hypertension Mother   . Heart disease Maternal Grandmother   . Stroke Maternal Grandmother     Social History History  Substance Use Topics  . Smoking status: Former Smoker -- 0.25 packs/day for 16 years    Types: Cigarettes    Start date: 06/25/1957    Quit date: 06/25/1973  . Smokeless tobacco: Never Used  . Alcohol Use: No    Are there smokers in your home (other than you)?  {yes/no:20286}  Risk Factors Current exercise habits: {exercise:19826}  Dietary issues discussed: ***   Cardiac risk factors: {risk factors:510}.  Depression Screen (Note: if answer to either of the following is "Yes", a more complete depression screening is indicated)   Q1: Over the past two weeks, have you felt down, depressed or hopeless? {yes/no:20286}  Q2: Over the past two weeks, have you felt little interest or pleasure in doing things? {yes/no:20286}  Have you lost interest or pleasure in daily life? {yes/no:20286}  Do you often feel hopeless? {yes/no:20286}  Do you cry easily over simple problems? {yes/no:20286}  Activities of Daily Living In your present state of health, do you have any difficulty performing the following activities?:  Driving? {yes/no:20286} Managing money?  {Responses; yes/no (default no):140031::"No"} Feeding yourself? {yes/no:20286} Getting from bed to chair? {yes/no:20286} Climbing a flight of stairs? {yes/no:20286} Preparing food and eating?: {yes/no (default no):140031::"No"} Bathing or showering? {Responses; yes/no (default no):140031::"No"} Getting dressed: {yes/no (default no):140031::"No"} Getting to the toilet?  {Responses; yes/no (default no):140031::"No"} Using the toilet:{yes/no (default no):140031::"No"} Moving around from place to place: {yes/no (default no):140031::"No"} In the past year have you fallen or had a near fall?:{yes/no (default no):140031::"No"}   Are you sexually active?  {yes/no:20286}  Do you have more than one partner?  {Responses; yes/no (default no):140031::"No"}  Hearing Difficulties: {yes/no (default no):140031::"No"} Do you often ask people to speak up or repeat themselves? {Responses; yes/no (default no):140031::"No"} Do you experience ringing or noises in your ears? {Responses; yes/no (default no):140031::"No"} Do you have difficulty understanding soft or whispered voices? {Responses; yes/no (default no):140031::"No"}   Do you feel that you have a problem with memory? {yes/no:20286}  Do you often misplace items? {yes/no:20286}  Do you feel safe at home?  {yes/no:20286}  Cognitive Testing  Alert? {yes/no:20286}  Normal Appearance?{yes/no:20286}  Oriented to person? {yes/no:20286}  Place? {yes/no:20286}   Time? {yes/no:20286}  Recall of three objects?  {yes/no:20286}  Can perform simple calculations? {yes/no:20286}  Displays appropriate judgment?{yes/no:20286}  Can read the correct time from a watch face?{yes/no:20286}   Advanced Directives have been discussed with the patient? {yes/no:20286}   List the Names of Other Physician/Practitioners you currently use: 1.    Indicate any recent Medical Services you may have received from other than [facility] providers in the past year (date may be approximate).  Immunization History  Administered Date(s) Administered  . Influenza Split 07/20/2011, 10/03/2012  . Influenza,inj,Quad PF,36+ Mos 09/11/2013  . Pneumococcal Polysaccharide-23 11/15/1998, 09/07/2005  . Td 11/11/2003    Screening Tests Health Maintenance  Topic Date Due  . Zostavax  06/08/2004  . Pneumococcal Polysaccharide Vaccine Age 47 And Over  06/08/2009   . Tetanus/tdap  11/10/2013  . Influenza Vaccine  06/15/2014  . Hemoglobin A1c  10/30/2014  . Ophthalmology Exam  03/07/2015  . Foot Exam  05/01/2015  . Urine Microalbumin  05/01/2015  . Colonoscopy  05/14/2024    All answers were reviewed with the patient and necessary referrals were made:  Jenny Reichmann, MD   09/13/2014   History reviewed: {history reviewed:20406::"allergies","current medications","past family history","past medical history","past social history","past surgical history","problem list"}  Review of Systems {ros; complete:30496}    Objective:     Vision by Snellen chart: right eye:{vision:19455::"20/20"}, left eye:{vision:19455::"20/20"} Blood pressure 140/74, pulse 60, temperature 97.5 F (36.4 C), temperature source Oral, resp. rate 16, height 5' 8"  (1.727 m), weight 240 lb (108.863 kg), SpO2 99.00%. Body mass index is 36.5 kg/(m^2).  {Exam, ZLDJ:57017}     Assessment:     ***     Plan:     During the course of the visit the patient was educated and counseled about appropriate screening and preventive services including:    {plan:19837}  Diet review for nutrition referral? Yes ____  Not Indicated ____   Patient Instructions (the written plan) was given to the patient.  Medicare Attestation I have personally reviewed: The patient's medical and social history Their use of alcohol, tobacco or illicit drugs Their current medications and supplements The patient's functional ability including ADLs,fall risks, home safety risks, cognitive, and hearing and visual impairment Diet and physical activities Evidence for depression or mood disorders  The patient's weight, height, BMI, and visual acuity have been recorded in the chart.  I have made referrals, counseling, and provided education to the patient based on review of the above and I have provided the patient with a written personalized care plan for preventive services.     Jenny Reichmann,  MD   09/13/2014

## 2014-09-13 NOTE — Progress Notes (Signed)
IDENTIFYING INFORMATION  [name] / DOB: [birth year] / MRN: [id number]  The patient has Essential hypertension; Osteoarthritis; Disability examination; Hyperglycemia; Atherogenic dyslipidemia; low HDL, in setting of metabolic syndrome; Renal cysts, acquired, bilateral; Obesity, Class III, BMI 40-49.9 (morbid obesity); and Metabolic syndrome on his problem list.  SUBJECTIVE  Chief Complaint: Annual Exam   History of present illness: [name] is a 68 y.o. year old male who presents for an annual exam.    He is receiving annual eye exams.  He is followed by an endocrinologist for his diabetes, who also checks he feet annually.  He denies changes in vision and denies sock and glove distribution pain.  He checks his sugar three times a week and reports it runs around 100.   He is exercising daily with a stair stepper, in which he does 20 minutes daily while watching wheel of fortune.    He reports that his brother passed about 6 months ago.  He denies depression, and reports that he enjoys computer work, watching TV, and going for a ride in his car.  He reports that his family relationships are good at this time. He sleeps well.     He reports having chest pain from time to time which he describes as tightness. These episodes will last about 20-30 minutes, and often occur with grass cutting. He associates this with allergies, as he reports that if he wears his mask when he mows he does not have this problem. When it does occur he will take some Albuterol and sit down for a while, and the pain will go away.  He will also have this pain when he rides with the windows down. He reports doing a treadmill tests a few years back, and can not recall the results.     He report being compliant with his BP medications, and also checks his BP at home, reporting that it is usually 112/70-80.   He  has a past medical history of Arthritis; Asthma; Blood transfusion without reported diagnosis;  Diabetes mellitus without complication; Hypertension; Dyslipidemia (high LDL; low HDL); Allergy; Ulcer; Obesity, Class III, BMI 40-49.9 (morbid obesity); and Carotid atherosclerosis.  The patient has a current medication list which includes the following prescription(s): albuterol, albuterol, aspirin, bisoprolol-hydrochlorothiazide, blood glucose meter kit and supplies, glucose blood, lancets, lisinopril, metformin, osteo bi-flex triple strength, multivitamin, simvastatin, and loratadine.  [name] has no allergies on file. He  reports that he quit smoking about 41 years ago. His smoking use included Cigarettes. He started smoking about 57 years ago. He has a 4 pack-year smoking history. He has never used smokeless tobacco. He reports that he does not drink alcohol or use illicit drugs. He  reports that he currently engages in sexual activity.  The patient  has past surgical history that includes arm surgery (2000); Wrist surgery; Rib fracture surgery; Knee surgery; Colon surgery; Colonoscopy; transthoracic echocardiogram (09/07/2010); and Persantine Myoview (myocardial Perfusion Imaging Stress Test) (October 2000 and).  His family history includes Heart disease in his maternal grandmother; Hypertension in his mother; Stroke in his maternal grandmother and mother.  Review of Systems  Constitutional: Negative.   HENT: Negative.   Eyes: Negative.   Respiratory: Negative for cough, hemoptysis, sputum production, shortness of breath and wheezing.   Cardiovascular: Positive for chest pain. Negative for palpitations, orthopnea, claudication, leg swelling and PND.  Gastrointestinal: Negative.   Genitourinary: Negative.   Musculoskeletal: Negative.   Skin: Negative.   Neurological: Negative.  Endo/Heme/Allergies: Positive for environmental allergies.  Psychiatric/Behavioral: Negative.     OBJECTIVE  Blood pressure 140/74, pulse 60, temperature 97.5 F (36.4 C), temperature source Oral, resp. rate  16, height _0  (1.727 m), weight 240 lb (108.863 kg), SpO2 99.00%. The patient's body mass index is 36.5 kg/(m^2).  Physical Exam  Constitutional: He is oriented to person, place, and time and well-developed, well-nourished, and in no distress.  HENT:  Head: Normocephalic.  Right Ear: External ear normal.  Left Ear: External ear normal.  Mouth/Throat: Oropharynx is clear and moist.  Eyes: Conjunctivae and EOM are normal. Pupils are equal, round, and reactive to light.  Neck: Normal range of motion. Neck supple. No JVD present.  Cardiovascular: Normal rate, regular rhythm, normal heart sounds and intact distal pulses.   Pulmonary/Chest: Effort normal and breath sounds normal. No respiratory distress. He has no wheezes. He has no rales. He exhibits no tenderness.  Abdominal: Soft. Bowel sounds are normal.  Musculoskeletal: Normal range of motion.  Neurological: He is alert and oriented to person, place, and time.  Skin: Skin is warm and dry. No rash noted. He is not diaphoretic. No erythema. No pallor.  Psychiatric: Mood, memory, affect and judgment normal.    No results found for this or any previous visit (from the past 24 hour(s)).   ASSESSMENT & PLAN  [name] was seen today for annual exam.  Diagnoses and associated orders for this visit:  Routine history and physical examination of adult - CBC with Differential - COMPLETE METABOLIC PANEL WITH GFR - Basic Metabolic Panel with GFR - Hepatic function panel  Hyperlipidemia - Lipid panel  Essential hypertension - POCT urinalysis dipstick  Screening for prostate cancer - PSA  Type 2 diabetes mellitus without complication - Hemoglobin A1c - Microalbumin, urine  Need for prophylactic vaccination and inoculation against influenza - Flu Vaccine QUAD 36+ mos IM  Need for prophylactic vaccination against Streptococcus pneumoniae (pneumococcus) - Pneumococcal conjugate vaccine 13-valent IM  Chest pain, unspecified chest  pain type - EKG 12-Lead -     Likely respiratory in nature, but could possibly be from a cardiac etiology. Patient denies that this is cardiac in origin and does not want to go for testing unless he has a "problem." Is amenable to an EKG today and is seeing a cardiologist on a regular basis.  It was explained to the patient that he does have risks factors for heart disease, and that acute coronary spectrum disorder can be difficult to rule out based on EKG alone, and that further testing would be beneficial. Patient did agreed to go to the ED should he ever have alarming symptoms, such as difficulty breathing, presyncope, or chest pain that does not abate.  He was urged to continue following up with his cardiologist.   -     Nitrostat given empirically given the possibility of ACS. Education materials provided in AVS.       The patient was instructed to to call or comeback to clinic as needed, or should symptoms warrant.  Philis Fendt, MHS, PA-C [facility] 09/13/2014 12:44 PM

## 2014-09-13 NOTE — Patient Instructions (Signed)
Nitroglycerin sublingual tablets  What is this medicine?  NITROGLYCERIN (nye troe GLI ser in) is a type of vasodilator. It relaxes blood vessels, increasing the blood and oxygen supply to your heart. This medicine is used to relieve chest pain caused by angina. It is also used to prevent chest pain before activities like climbing stairs, going outdoors in cold weather, or sexual activity.  This medicine may be used for other purposes; ask your health care provider or pharmacist if you have questions.  COMMON BRAND NAME(S): Nitroquick, Nitrostat, Nitrotab  What should I tell my health care provider before I take this medicine?  They need to know if you have any of these conditions:  -anemia  -head injury, recent stroke, or bleeding in the brain  -liver disease  -previous heart attack  -an unusual or allergic reaction to nitroglycerin, other medicines, foods, dyes, or preservatives  -pregnant or trying to get pregnant  -breast-feeding  How should I use this medicine?  Take this medicine by mouth as needed. At the first sign of an angina attack (chest pain or tightness) place one tablet under your tongue. You can also take this medicine 5 to 10 minutes before an event likely to produce chest pain. Follow the directions on the prescription label. Let the tablet dissolve under the tongue. Do not swallow whole. Replace the dose if you accidentally swallow it. It will help if your mouth is not dry. Saliva around the tablet will help it to dissolve more quickly. Do not eat or drink, smoke or chew tobacco while a tablet is dissolving. If you are not better within 5 minutes after taking ONE dose of nitroglycerin, call 9-1-1 immediately to seek emergency medical care. Do not take more than 3 nitroglycerin tablets over 15 minutes.  If you take this medicine often to relieve symptoms of angina, your doctor or health care professional may provide you with different instructions to manage your symptoms. If symptoms do not go away  after following these instructions, it is important to call 9-1-1 immediately. Do not take more than 3 nitroglycerin tablets over 15 minutes.  Talk to your pediatrician regarding the use of this medicine in children. Special care may be needed.  Overdosage: If you think you have taken too much of this medicine contact a poison control center or emergency room at once.  NOTE: This medicine is only for you. Do not share this medicine with others.  What if I miss a dose?  This does not apply. This medicine is only used as needed.  What may interact with this medicine?  Do not take this medicine with any of the following medications:  -certain migraine medicines like ergotamine and dihydroergotamine (DHE)  -medicines used to treat erectile dysfunction like sildenafil, tadalafil, and vardenafil  -riociguat  This medicine may also interact with the following medications:  -alteplase  -aspirin  -heparin  -medicines for high blood pressure  -medicines for mental depression  -other medicines used to treat angina  -phenothiazines like chlorpromazine, mesoridazine, prochlorperazine, thioridazine  This list may not describe all possible interactions. Give your health care provider a list of all the medicines, herbs, non-prescription drugs, or dietary supplements you use. Also tell them if you smoke, drink alcohol, or use illegal drugs. Some items may interact with your medicine.  What should I watch for while using this medicine?  Tell your doctor or health care professional if you feel your medicine is no longer working.  Keep this medicine with you at   dizzy, take several deep breaths and lie down with your feet propped up, or bend forward with your head resting between your knees. You may get drowsy or dizzy. Do not drive, use machinery,  or do anything that needs mental alertness until you know how this drug affects you. Do not stand or sit up quickly, especially if you are an older patient. This reduces the risk of dizzy or fainting spells. Alcohol can make you more drowsy and dizzy. Avoid alcoholic drinks. Do not treat yourself for coughs, colds, or pain while you are taking this medicine without asking your doctor or health care professional for advice. Some ingredients may increase your blood pressure. What side effects may I notice from receiving this medicine? Side effects that you should report to your doctor or health care professional as soon as possible: -blurred vision -dry mouth -skin rash -sweating -the feeling of extreme pressure in the head -unusually weak or tired Side effects that usually do not require medical attention (report to your doctor or health care professional if they continue or are bothersome): -flushing of the face or neck -headache -irregular heartbeat, palpitations -nausea, vomiting This list may not describe all possible side effects. Call your doctor for medical advice about side effects. You may report side effects to FDA at 1-800-FDA-1088. Where should I keep my medicine? Keep out of the reach of children. Store at room temperature between 20 and 25 degrees C (68 and 77 degrees F). Store in [employer]. Protect from light and moisture. Keep tightly closed. Throw away any unused medicine after the expiration date. NOTE: This sheet is a summary. It may not cover all possible information. If you have questions about this medicine, talk to your doctor, pharmacist, or health care provider.  2015, [employer]vier/Gold Standard. (2013-08-30 17:57:36)

## 2014-09-14 LAB — LIPID PANEL
CHOL/HDL RATIO: 3.2 ratio
CHOLESTEROL: 108 mg/dL (ref 0–200)
HDL: 34 mg/dL — ABNORMAL LOW (ref >39)
LDL Cholesterol: 50 mg/dL (ref 0–99)
TRIGLYCERIDES: 122 mg/dL (ref <150)
VLDL: 24 mg/dL (ref 0–40)

## 2014-09-14 LAB — CBC WITH DIFFERENTIAL/PLATELET
BASOS ABS: 0 10*3/uL (ref 0.0–0.1)
BASOS PCT: 1 % (ref 0–1)
EOS ABS: 0.3 10*3/uL (ref 0.0–0.7)
EOS PCT: 7 % — AB (ref 0–5)
HEMATOCRIT: 43.3 % (ref 39.0–52.0)
Hemoglobin: 14.5 g/dL (ref 13.0–17.0)
LYMPHS PCT: 38 % (ref 12–46)
Lymphs Abs: 1.6 10*3/uL (ref 0.7–4.0)
MCH: 28.9 pg (ref 26.0–34.0)
MCHC: 33.5 g/dL (ref 30.0–36.0)
MCV: 86.4 fL (ref 78.0–100.0)
MONO ABS: 0.5 10*3/uL (ref 0.1–1.0)
Monocytes Relative: 11 % (ref 3–12)
Neutro Abs: 1.8 10*3/uL (ref 1.7–7.7)
Neutrophils Relative %: 43 % (ref 43–77)
PLATELETS: 177 10*3/uL (ref 150–400)
RBC: 5.01 MIL/uL (ref 4.22–5.81)
RDW: 14.2 % (ref 11.5–15.5)
WBC: 4.3 10*3/uL (ref 4.0–10.5)

## 2014-09-14 LAB — COMPLETE METABOLIC PANEL WITH GFR
ALT: 18 U/L (ref 0–53)
AST: 24 U/L (ref 0–37)
Albumin: 4.6 g/dL (ref 3.5–5.2)
Alkaline Phosphatase: 71 U/L (ref 39–117)
BILIRUBIN TOTAL: 0.6 mg/dL (ref 0.2–1.2)
BUN: 15 mg/dL (ref 6–23)
CALCIUM: 9.5 mg/dL (ref 8.4–10.5)
CO2: 24 mEq/L (ref 19–32)
CREATININE: 1.16 mg/dL (ref 0.50–1.35)
Chloride: 105 mEq/L (ref 96–112)
GFR, Est African American: 73 mL/min
GFR, Est Non African American: 63 mL/min
Glucose, Bld: 80 mg/dL (ref 70–99)
Potassium: 4.2 mEq/L (ref 3.5–5.3)
Sodium: 142 mEq/L (ref 135–145)
Total Protein: 6.9 g/dL (ref 6.0–8.3)

## 2014-09-14 LAB — BASIC METABOLIC PANEL WITH GFR
BUN: 15 mg/dL (ref 6–23)
CHLORIDE: 106 meq/L (ref 96–112)
CO2: 23 mEq/L (ref 19–32)
Calcium: 9.5 mg/dL (ref 8.4–10.5)
Creat: 1.12 mg/dL (ref 0.50–1.35)
GFR, EST AFRICAN AMERICAN: 77 mL/min
GFR, Est Non African American: 66 mL/min
Glucose, Bld: 79 mg/dL (ref 70–99)
Potassium: 4.2 mEq/L (ref 3.5–5.3)
SODIUM: 143 meq/L (ref 135–145)

## 2014-09-14 LAB — HEPATIC FUNCTION PANEL
ALT: 18 U/L (ref 0–53)
AST: 23 U/L (ref 0–37)
Albumin: 4.6 g/dL (ref 3.5–5.2)
Alkaline Phosphatase: 71 U/L (ref 39–117)
BILIRUBIN INDIRECT: 0.5 mg/dL (ref 0.2–1.2)
BILIRUBIN TOTAL: 0.6 mg/dL (ref 0.2–1.2)
Bilirubin, Direct: 0.1 mg/dL (ref 0.0–0.3)
Total Protein: 7 g/dL (ref 6.0–8.3)

## 2014-09-14 LAB — MICROALBUMIN, URINE: Microalb, Ur: 0.3 mg/dL (ref <2.0)

## 2014-09-14 LAB — PSA: PSA: 1.05 ng/mL (ref <=4.00)

## 2014-09-15 ENCOUNTER — Encounter: Payer: Self-pay | Admitting: Cardiology

## 2014-09-15 LAB — HEMOGLOBIN A1C
Hgb A1c MFr Bld: 5.9 % — ABNORMAL HIGH (ref <5.7)
Mean Plasma Glucose: 123 mg/dL — ABNORMAL HIGH (ref <117)

## 2014-09-23 ENCOUNTER — Other Ambulatory Visit: Payer: Self-pay | Admitting: Emergency Medicine

## 2014-10-08 ENCOUNTER — Other Ambulatory Visit: Payer: Self-pay | Admitting: Emergency Medicine

## 2014-10-15 ENCOUNTER — Telehealth: Payer: Self-pay | Admitting: *Deleted

## 2014-10-15 NOTE — Telephone Encounter (Signed)
-----   Message from Leonie Man, MD sent at 09/15/2014 10:32 AM EST ----- Thanks for the update - we will try to get him in soon.  DH ----- Message -----    From: Kathlen Brunswick, PA-C    Sent: 09/13/2014   1:55 PM      To: Leonie Man, MD  This patient has been having some mild SOB with physical activity, however has a long history of asthma. He was given SL Nitro. No significant changes on EKG. Plan was discussed with Dr. Everlene Farrier.

## 2014-10-15 NOTE — Telephone Encounter (Signed)
Spoke to patient.  patient was very adamant to keep appointment in jan 2016. Patient states he is very appreciative of the doctor's concern ,but he is not having any problems at present. He did not want to schedule any appointment any time in [facility] 2016. Appointment schedule for 11/20/14 --8am patient is aware.

## 2014-10-15 NOTE — Telephone Encounter (Signed)
LEFT MESSAGE FOR PATIENT TO CALL BACK   (WOULD LIKE TO ARRANGE FOR APPOINTMENT WITH [facility] IN [phone])

## 2014-10-16 ENCOUNTER — Telehealth: Payer: Self-pay | Admitting: Cardiology

## 2014-10-16 NOTE — Telephone Encounter (Signed)
Close encounter 

## 2014-11-17 ENCOUNTER — Other Ambulatory Visit: Payer: Self-pay | Admitting: Emergency Medicine

## 2014-11-20 ENCOUNTER — Ambulatory Visit: Payer: 59 | Admitting: Cardiology

## 2014-11-21 ENCOUNTER — Telehealth: Payer: Self-pay | Admitting: Cardiology

## 2014-11-21 ENCOUNTER — Ambulatory Visit (INDEPENDENT_AMBULATORY_CARE_PROVIDER_SITE_OTHER): Payer: 59 | Admitting: Cardiology

## 2014-11-21 ENCOUNTER — Encounter: Payer: Self-pay | Admitting: Cardiology

## 2014-11-21 VITALS — BP 145/77 | HR 58 | Ht 68.0 in | Wt 238.2 lb

## 2014-11-21 DIAGNOSIS — E669 Obesity, unspecified: Secondary | ICD-10-CM

## 2014-11-21 DIAGNOSIS — R9431 Abnormal electrocardiogram [ECG] [EKG]: Secondary | ICD-10-CM | POA: Insufficient documentation

## 2014-11-21 DIAGNOSIS — I10 Essential (primary) hypertension: Secondary | ICD-10-CM

## 2014-11-21 DIAGNOSIS — E785 Hyperlipidemia, unspecified: Secondary | ICD-10-CM

## 2014-11-21 DIAGNOSIS — E8881 Metabolic syndrome: Secondary | ICD-10-CM

## 2014-11-21 NOTE — Assessment & Plan Note (Addendum)
Pt has had chronic EKG abnormality. Low risk Myoview 2000, NL echo 2011

## 2014-11-21 NOTE — Assessment & Plan Note (Signed)
Repeat B/P 118/74

## 2014-11-21 NOTE — Telephone Encounter (Signed)
Pt called in stating that he had an office visit today and he says that when the nurse took his BP initally, it was recorded at 145/77, which he says was wrong so when it was taken the second time it was 116/70(he was unsure of the diastolyic number). But the second recording was not on his AVS and he wanted to make sure that it was in his chart. Please notify the pt of this correction  Thanks

## 2014-11-21 NOTE — Assessment & Plan Note (Signed)
BMI 36 

## 2014-11-21 NOTE — Addendum Note (Signed)
Addended by: Janett Labella A on: 11/21/2014 08:49 AM   Modules accepted: Orders

## 2014-11-21 NOTE — Patient Instructions (Signed)
Your physician recommends that you schedule a follow-up appointment in: One year with Dr. Ellyn Hack.
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
## 2014-11-21 NOTE — Assessment & Plan Note (Signed)
On statin Rx 

## 2014-11-21 NOTE — Assessment & Plan Note (Signed)
Truncal obesity, low HDL, hypertension, last HGb A1c 5.9

## 2014-11-21 NOTE — Telephone Encounter (Signed)
Reported Luke's BP reading of 118/74 to patient. No further questions. Pt voiced understanding.

## 2014-12-10 ENCOUNTER — Encounter: Payer: Self-pay | Admitting: Emergency Medicine

## 2014-12-10 ENCOUNTER — Ambulatory Visit (INDEPENDENT_AMBULATORY_CARE_PROVIDER_SITE_OTHER): Payer: 59 | Admitting: Emergency Medicine

## 2014-12-10 VITALS — BP 130/80 | HR 54 | Temp 97.9°F | Resp 16 | Ht 68.0 in | Wt 238.8 lb

## 2014-12-10 DIAGNOSIS — E119 Type 2 diabetes mellitus without complications: Secondary | ICD-10-CM

## 2014-12-10 LAB — POCT GLYCOSYLATED HEMOGLOBIN (HGB A1C): Hemoglobin A1C: 5.4

## 2014-12-10 LAB — GLUCOSE, POCT (MANUAL RESULT ENTRY): POC Glucose: 125 mg/dl — AB (ref 70–99)

## 2014-12-10 MED ORDER — BISOPROLOL-HYDROCHLOROTHIAZIDE 10-6.25 MG PO TABS: 1.0000 | ORAL_TABLET | Freq: Every day | ORAL | Status: DC

## 2014-12-10 MED ORDER — METFORMIN HCL 500 MG PO TABS: ORAL_TABLET | ORAL | Status: DC

## 2014-12-10 MED ORDER — SIMVASTATIN 40 MG PO TABS: ORAL_TABLET | ORAL | Status: DC

## 2014-12-10 NOTE — Progress Notes (Addendum)
   Subjective:  This chart was scribed for [name], MD by Lowella Petties, ED Scribe. The patient was seen in room 21. Patient's care was started at 11:04 AM.  Patient ID: [name], male    DOB: July 03, 1946, 69 y.o.   MRN: [id number]  HPI  HPI Comments: [name] is a 69 y.o. male with a  History of HTN and DM who presents to the [facility] for a 3 month follow up of his DM. He states that he feels good today, and he had an EKG done by Dr. Johny Chess 2-3 weeks ago that was normal. He states that his BP was a little high at that time (systolic 244), but at home it has been lower and when Dr. Johny Chess rechecked it was lower (systolic 628). Today it was 120/78. His BS readings have been great at home. He states that he would like to get off of some of his medications if possible.   Review of Systems  Constitutional: Negative for fever and chills.  HENT: Negative for congestion, rhinorrhea and sore throat.   Respiratory: Negative for cough and shortness of breath.   Cardiovascular: Negative for chest pain and leg swelling.  Gastrointestinal: Negative for nausea, vomiting, abdominal pain and diarrhea.  Genitourinary: Negative for dysuria and hematuria.  Musculoskeletal: Negative for back pain and neck pain.  Skin: Negative for rash and wound.  Neurological: Negative for weakness, numbness and headaches.  Psychiatric/Behavioral: Negative for confusion.     Objective:  Physical Exam   CONSTITUTIONAL: Well developed/well nourished HEAD: Normocephalic/atraumatic EYES: EOMI/PERRL ENMT: Mucous membranes moist NECK: supple no meningeal signs SPINE/BACK:entire spine nontender CV: S1/S2 noted, no murmurs/rubs/gallops noted LUNGS: Lungs are clear to auscultation bilaterally, no apparent distress ABDOMEN: soft, nontender, no rebound or guarding, bowel sounds noted throughout abdomen GU:no cva tenderness NEURO: Pt is awake/alert/appropriate, moves all  extremitiesx4.  No facial droop.   EXTREMITIES: pulses normal/equal, full ROM SKIN: warm, color normal PSYCH: no abnormalities of mood noted, alert and oriented to situation Results for orders placed or performed in visit on 12/10/14  POCT glucose (manual entry)  Result Value Ref Range   POC Glucose 125 (A) 70 - 99 mg/dl  POCT glycosylated hemoglobin (Hb A1C)  Result Value Ref Range   Hemoglobin A1C 5.4    Meds ordered this encounter  Medications  . simvastatin (ZOCOR) 40 MG tablet    Sig: TAKE 1/2 TABLET BY MOUTH AT BEDTIME    Dispense:  30 tablet    Refill:  11  . bisoprolol-hydrochlorothiazide (ZIAC) 10-6.25 MG per tablet    Sig: Take 1 tablet by mouth daily.    Dispense:  30 tablet    Refill:  11  . metFORMIN (GLUCOPHAGE) 500 MG tablet    Sig: One tab daily    Dispense:  30 tablet    Refill:  0   Assessment & Plan:   Would decrease Glucophage to once a day. Recheck in 3 months if still continuing to do well can probably stop his Glucophage at that time.I personally performed the services described in this documentation, which was scribed in my presence. The recorded information has been reviewed and is accurate.

## 2015-01-09 ENCOUNTER — Other Ambulatory Visit: Payer: Self-pay | Admitting: Emergency Medicine

## 2015-01-10 ENCOUNTER — Telehealth: Payer: Self-pay | Admitting: Emergency Medicine

## 2015-01-10 NOTE — Telephone Encounter (Signed)
This was already done. Spoke with pt, advised Rx was sent in.

## 2015-01-10 NOTE — Telephone Encounter (Signed)
Metformin refill  [phone]

## 2015-02-27 ENCOUNTER — Telehealth: Payer: Self-pay | Admitting: Family Medicine

## 2015-02-27 NOTE — Telephone Encounter (Signed)
lmom that Dr Everlene Farrier 04/08/15 appt has been cancelled patient new appt is 03/25/15 at 10:45

## 2015-02-28 ENCOUNTER — Telehealth: Payer: Self-pay | Admitting: Cardiology

## 2015-03-03 NOTE — Telephone Encounter (Signed)
Close encounter 

## 2015-03-12 LAB — HM DIABETES EYE EXAM

## 2015-03-25 ENCOUNTER — Ambulatory Visit (INDEPENDENT_AMBULATORY_CARE_PROVIDER_SITE_OTHER): Payer: 59 | Admitting: Emergency Medicine

## 2015-03-25 ENCOUNTER — Encounter: Payer: Self-pay | Admitting: Emergency Medicine

## 2015-03-25 VITALS — BP 130/76 | HR 51 | Temp 98.3°F | Resp 16 | Ht 68.0 in | Wt 241.6 lb

## 2015-03-25 DIAGNOSIS — E785 Hyperlipidemia, unspecified: Secondary | ICD-10-CM

## 2015-03-25 DIAGNOSIS — E8881 Metabolic syndrome: Secondary | ICD-10-CM | POA: Diagnosis not present

## 2015-03-25 DIAGNOSIS — E119 Type 2 diabetes mellitus without complications: Secondary | ICD-10-CM | POA: Diagnosis not present

## 2015-03-25 DIAGNOSIS — I6521 Occlusion and stenosis of right carotid artery: Secondary | ICD-10-CM | POA: Diagnosis not present

## 2015-03-25 DIAGNOSIS — I10 Essential (primary) hypertension: Secondary | ICD-10-CM

## 2015-03-25 DIAGNOSIS — Z23 Encounter for immunization: Secondary | ICD-10-CM | POA: Diagnosis not present

## 2015-03-25 LAB — LIPID PANEL
Cholesterol: 100 mg/dL (ref 0–200)
HDL: 33 mg/dL — AB (ref >=40)
LDL Cholesterol: 47 mg/dL (ref 0–99)
TRIGLYCERIDES: 101 mg/dL (ref <150)
Total CHOL/HDL Ratio: 3 Ratio
VLDL: 20 mg/dL (ref 0–40)

## 2015-03-25 LAB — BASIC METABOLIC PANEL
BUN: 12 mg/dL (ref 6–23)
CALCIUM: 9.9 mg/dL (ref 8.4–10.5)
CO2: 23 mEq/L (ref 19–32)
Chloride: 105 mEq/L (ref 96–112)
Creat: 1.15 mg/dL (ref 0.50–1.35)
Glucose, Bld: 94 mg/dL (ref 70–99)
Potassium: 4.8 mEq/L (ref 3.5–5.3)
SODIUM: 140 meq/L (ref 135–145)

## 2015-03-25 LAB — GLUCOSE, POCT (MANUAL RESULT ENTRY): POC GLUCOSE: 120 mg/dL — AB (ref 70–99)

## 2015-03-25 LAB — POCT GLYCOSYLATED HEMOGLOBIN (HGB A1C): Hemoglobin A1C: 5.8

## 2015-03-25 MED ORDER — LISINOPRIL 2.5 MG PO TABS: 2.5000 mg | ORAL_TABLET | Freq: Every day | ORAL | Status: DC

## 2015-03-25 NOTE — Progress Notes (Addendum)
   Subjective:  This chart was scribed for Bruce Russian, MD by Starleen Arms, Medical Scribe. This patient was seen in room 22 and the patient's care was started at 10:36 AM.   Patient ID: [name], male    DOB: 28-Aug-1946, 69 y.o.   MRN: [id number]  HPI patient here for follow-up hypertension and diabetes. He also has elevated cholesterol and diagnosis of metabolic syndrome. HPI Comments: [name] is a 69 y.o. male who presents to [facility] needing follow-up  Patient reports a Life Line Screening in June 2015. The findings of which were lower legs normal; no aneurysm; no atrial fibrillation; very mild carotid on the right.  He plans to schedule another for 04/2015.    Patient reports he has been trying to eat more healthily   Review of Systems     Objective:   Physical Exam  Nursing note and vitals reviewed.  CONSTITUTIONAL: Well developed/well nourished HEAD: Normocephalic/atraumatic EYES: EOMI/PERRL ENMT: Mucous membranes moist NECK: supple no meningeal signs SPINE/BACK:entire spine nontender CV: S1/S2 noted, no murmurs/rubs/gallops noted; DP pulses normal LUNGS: Lungs are clear to auscultation bilaterally, no apparent distress ABDOMEN: soft, nontender, no rebound or guarding, bowel sounds noted throughout abdomen GU:no cva tenderness NEURO: Pt is awake/alert/appropriate, moves all extremitiesx4.  No facial droop.   EXTREMITIES: pulses normal/equal, full ROM; deformity in left ankle from previous injury; trace edema.   SKIN: warm, color normal PSYCH: no abnormalities of mood noted, alert and oriented to situation Results for orders placed or performed in visit on 03/25/15  POCT glycosylated hemoglobin (Hb A1C)  Result Value Ref Range   Hemoglobin A1C 5.8   POCT glucose (manual entry)  Result Value Ref Range   POC Glucose 120 (A) 70 - 99 mg/dl      Assessment & Plan:  1. Diabetes mellitus without complication  - POCT glycosylated hemoglobin (Hb A1C) - POCT  glucose (manual entry) - Tdap vaccine greater than or equal to 7yo IM  2. Hyperlipidemia  - Lipid panel  3. Essential hypertension  - Basic metabolic panel  4. Metabolic syndrome   5. Carotid artery stenosis, asymptomatic, right  He will have carotid studies done.  I personally performed the services described in this documentation, which was scribed in my presence. The recorded information has been reviewed and is accurate.  Arlyss Queen, MD  [facility]  03/25/2015 12:00 PM

## 2015-03-31 ENCOUNTER — Encounter: Payer: Self-pay | Admitting: *Deleted

## 2015-04-07 ENCOUNTER — Other Ambulatory Visit: Payer: Self-pay | Admitting: Emergency Medicine

## 2015-04-08 ENCOUNTER — Ambulatory Visit: Payer: 59 | Admitting: Emergency Medicine

## 2015-04-24 ENCOUNTER — Encounter: Payer: Self-pay | Admitting: Emergency Medicine

## 2015-05-22 ENCOUNTER — Other Ambulatory Visit: Payer: Self-pay | Admitting: *Deleted

## 2015-05-23 ENCOUNTER — Other Ambulatory Visit: Payer: Self-pay | Admitting: *Deleted

## 2015-05-23 DIAGNOSIS — I10 Essential (primary) hypertension: Secondary | ICD-10-CM

## 2015-05-23 MED ORDER — LISINOPRIL 2.5 MG PO TABS: 2.5000 mg | ORAL_TABLET | Freq: Every day | ORAL | Status: DC

## 2015-06-13 ENCOUNTER — Ambulatory Visit (INDEPENDENT_AMBULATORY_CARE_PROVIDER_SITE_OTHER): Payer: 59 | Admitting: Cardiology

## 2015-06-13 ENCOUNTER — Encounter: Payer: Self-pay | Admitting: Cardiology

## 2015-06-13 VITALS — BP 126/82 | HR 53 | Ht 68.0 in | Wt 237.4 lb

## 2015-06-13 DIAGNOSIS — E669 Obesity, unspecified: Secondary | ICD-10-CM

## 2015-06-13 DIAGNOSIS — E785 Hyperlipidemia, unspecified: Secondary | ICD-10-CM

## 2015-06-13 DIAGNOSIS — E8881 Metabolic syndrome: Secondary | ICD-10-CM

## 2015-06-13 DIAGNOSIS — I10 Essential (primary) hypertension: Secondary | ICD-10-CM
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
## 2015-06-13 NOTE — Patient Instructions (Signed)
No change with current medictions  If you have chest pain , may use NTG and 4 baby aspirin or full regular strength aspirin. Contact office.   Your physician wants you to follow-up in 12 months Dr Ellyn Hack.  You will receive a reminder letter in the mail two months in advance. If you don't receive a letter, please call our office to schedule the follow-up appointment.

## 2015-06-15 ENCOUNTER — Encounter: Payer: Self-pay | Admitting: Cardiology

## 2015-06-15 NOTE — Assessment & Plan Note (Signed)
The patient understands the need to lose weight with diet and exercise. We have discussed specific strategies for this.  

## 2015-06-15 NOTE — Assessment & Plan Note (Signed)
On statin with simvastatin 20 mg daily. Monitor by PCP. I would recommend NMR panel for next screening labs. His current LDL is excellent, HDL is low not unexpectedly .

## 2015-06-15 NOTE — Assessment & Plan Note (Signed)
From obesity, borderline diabetes and low HDL. Again stressed the importance of dietary modification and continued weight loss. He is trying to work on exercise and modifying his diet. Basically reducing caloric intake.

## 2015-06-15 NOTE — Assessment & Plan Note (Signed)
Well-controlled on current medications. No change. Continue beta blocker/HCTZ and ACE inhibitor.

## 2015-07-09 ENCOUNTER — Telehealth: Payer: Self-pay

## 2015-07-09 NOTE — Telephone Encounter (Signed)
Pt calling wondering if he could use triamcinolone cream for his neck pain. Advised that he could not

## 2015-07-29 ENCOUNTER — Ambulatory Visit (INDEPENDENT_AMBULATORY_CARE_PROVIDER_SITE_OTHER): Payer: 59

## 2015-07-29 ENCOUNTER — Ambulatory Visit (INDEPENDENT_AMBULATORY_CARE_PROVIDER_SITE_OTHER): Payer: 59 | Admitting: Emergency Medicine

## 2015-07-29 VITALS — BP 121/75 | HR 58 | Temp 98.2°F | Resp 16 | Ht 68.0 in | Wt 238.0 lb

## 2015-07-29 DIAGNOSIS — E119 Type 2 diabetes mellitus without complications: Secondary | ICD-10-CM

## 2015-07-29 DIAGNOSIS — Z23 Encounter for immunization: Secondary | ICD-10-CM

## 2015-07-29 DIAGNOSIS — M542 Cervicalgia: Secondary | ICD-10-CM

## 2015-07-29 DIAGNOSIS — J45909 Unspecified asthma, uncomplicated: Secondary | ICD-10-CM | POA: Diagnosis not present

## 2015-07-29 LAB — POCT GLYCOSYLATED HEMOGLOBIN (HGB A1C): Hemoglobin A1C: 5.8

## 2015-07-29 MED ORDER — GLUCOSE BLOOD VI STRP: ORAL_STRIP | Status: DC

## 2015-07-29 MED ORDER — ALBUTEROL SULFATE HFA 108 (90 BASE) MCG/ACT IN AERS: 2.0000 | INHALATION_SPRAY | RESPIRATORY_TRACT | Status: DC | PRN
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
## 2015-08-19 ENCOUNTER — Encounter: Payer: Self-pay | Admitting: Emergency Medicine

## 2015-11-25 ENCOUNTER — Encounter: Payer: Self-pay | Admitting: Emergency Medicine

## 2015-11-25 ENCOUNTER — Ambulatory Visit (INDEPENDENT_AMBULATORY_CARE_PROVIDER_SITE_OTHER): Payer: 59 | Admitting: Emergency Medicine

## 2015-11-25 ENCOUNTER — Ambulatory Visit: Payer: 59 | Admitting: Emergency Medicine

## 2015-11-25 VITALS — BP 112/67 | HR 61 | Temp 98.0°F | Resp 17 | Ht 68.0 in | Wt 243.0 lb

## 2015-11-25 DIAGNOSIS — E8881 Metabolic syndrome: Secondary | ICD-10-CM | POA: Diagnosis not present

## 2015-11-25 DIAGNOSIS — E119 Type 2 diabetes mellitus without complications: Secondary | ICD-10-CM

## 2015-11-25 DIAGNOSIS — Q6102 Congenital multiple renal cysts: Secondary | ICD-10-CM

## 2015-11-25 DIAGNOSIS — N281 Cyst of kidney, acquired: Secondary | ICD-10-CM

## 2015-11-25 DIAGNOSIS — Z1159 Encounter for screening for other viral diseases: Secondary | ICD-10-CM | POA: Diagnosis not present

## 2015-11-25 DIAGNOSIS — Z125 Encounter for screening for malignant neoplasm of prostate: Secondary | ICD-10-CM

## 2015-11-25 LAB — CBC WITH DIFFERENTIAL/PLATELET
Basophils Absolute: 0 10*3/uL (ref 0.0–0.1)
Basophils Relative: 1 % (ref 0–1)
Eosinophils Absolute: 0.2 10*3/uL (ref 0.0–0.7)
Eosinophils Relative: 5 % (ref 0–5)
HEMATOCRIT: 41.8 % (ref 39.0–52.0)
HEMOGLOBIN: 14.8 g/dL (ref 13.0–17.0)
LYMPHS ABS: 1.5 10*3/uL (ref 0.7–4.0)
LYMPHS PCT: 35 % (ref 12–46)
MCH: 30 pg (ref 26.0–34.0)
MCHC: 35.4 g/dL (ref 30.0–36.0)
MCV: 84.6 fL (ref 78.0–100.0)
MONO ABS: 0.5 10*3/uL (ref 0.1–1.0)
MONOS PCT: 11 % (ref 3–12)
MPV: 10.6 fL (ref 8.6–12.4)
NEUTROS ABS: 2 10*3/uL (ref 1.7–7.7)
NEUTROS PCT: 48 % (ref 43–77)
Platelets: 165 10*3/uL (ref 150–400)
RBC: 4.94 MIL/uL (ref 4.22–5.81)
RDW: 14.2 % (ref 11.5–15.5)
WBC: 4.2 10*3/uL (ref 4.0–10.5)

## 2015-11-25 LAB — COMPLETE METABOLIC PANEL WITH GFR
ALBUMIN: 4.4 g/dL (ref 3.6–5.1)
ALK PHOS: 78 U/L (ref 40–115)
ALT: 17 U/L (ref 9–46)
AST: 19 U/L (ref 10–35)
BILIRUBIN TOTAL: 0.6 mg/dL (ref 0.2–1.2)
BUN: 12 mg/dL (ref 7–25)
CALCIUM: 9.8 mg/dL (ref 8.6–10.3)
CO2: 24 mmol/L (ref 20–31)
Chloride: 107 mmol/L (ref 98–110)
Creat: 1.19 mg/dL — ABNORMAL HIGH (ref 0.70–1.18)
GFR, EST NON AFRICAN AMERICAN: 61 mL/min (ref >=60)
GFR, Est African American: 71 mL/min (ref >=60)
GLUCOSE: 90 mg/dL (ref 65–99)
POTASSIUM: 4.5 mmol/L (ref 3.5–5.3)
SODIUM: 143 mmol/L (ref 135–146)
TOTAL PROTEIN: 6.8 g/dL (ref 6.1–8.1)

## 2015-11-25 LAB — LIPID PANEL
Cholesterol: 102 mg/dL — ABNORMAL LOW (ref 125–200)
HDL: 30 mg/dL — AB (ref >=40)
LDL CALC: 52 mg/dL (ref <130)
TRIGLYCERIDES: 101 mg/dL (ref <150)
Total CHOL/HDL Ratio: 3.4 Ratio (ref <=5.0)
VLDL: 20 mg/dL (ref <30)

## 2015-11-25 LAB — MICROALBUMIN, URINE: MICROALB UR: 0.3 mg/dL

## 2015-11-25 LAB — GLUCOSE, POCT (MANUAL RESULT ENTRY): POC Glucose: 104 mg/dl — AB (ref 70–99)

## 2015-11-25 LAB — POCT GLYCOSYLATED HEMOGLOBIN (HGB A1C)
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
## 2015-11-26 ENCOUNTER — Ambulatory Visit
Admission: RE | Admit: 2015-11-26 | Discharge: 2015-11-26 | Disposition: A | Discharge status: Home / Self Care | Payer: 59 | Source: Ambulatory Visit | Attending: Emergency Medicine | Admitting: Emergency Medicine

## 2015-11-26 DIAGNOSIS — N281 Cyst of kidney, acquired: Secondary | ICD-10-CM

## 2015-11-26 LAB — PSA, MEDICARE: PSA: 1.35 ng/mL (ref <=4.00)

## 2015-11-26 LAB — HEPATITIS C ANTIBODY: HCV AB: NEGATIVE

## 2015-12-26 ENCOUNTER — Other Ambulatory Visit: Payer: Self-pay | Admitting: Emergency Medicine

## 2016-02-24 ENCOUNTER — Ambulatory Visit (INDEPENDENT_AMBULATORY_CARE_PROVIDER_SITE_OTHER): Payer: 59 | Admitting: Physician Assistant

## 2016-02-24 VITALS — BP 128/80 | HR 63 | Temp 98.1°F | Resp 18 | Ht 68.0 in | Wt 250.5 lb

## 2016-02-24 DIAGNOSIS — B070 Plantar wart: Secondary | ICD-10-CM | POA: Diagnosis not present
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
## 2016-02-24 NOTE — Patient Instructions (Addendum)
Keep lesions covered with bandage. Will turn into blister. Wash with soap and water in shower each day. Once blisters have healed, can stop covering with bandage.    IF you received an x-ray today, you will receive an invoice from [facility]. Please contact [facility] at [phone] with questions or concerns regarding your invoice.   IF you received labwork today, you will receive an invoice from [employer]. Please contact [facility] at [phone] with questions or concerns regarding your invoice.   Our billing staff will not be able to assist you with questions regarding bills from these companies.  You will be contacted with the lab results as soon as they are available. The fastest way to get your results is to activate your My Chart account. Instructions are located on the last page of this paperwork. If you have not heard from [place] regarding the results in 2 weeks, please contact this office.

## 2016-03-07 ENCOUNTER — Other Ambulatory Visit: Payer: Self-pay | Admitting: Cardiology

## 2016-03-23 ENCOUNTER — Ambulatory Visit: Payer: 59 | Admitting: Emergency Medicine

## 2016-03-25 ENCOUNTER — Encounter: Payer: Self-pay | Admitting: Emergency Medicine

## 2016-03-25 ENCOUNTER — Ambulatory Visit (INDEPENDENT_AMBULATORY_CARE_PROVIDER_SITE_OTHER): Payer: 59 | Admitting: Emergency Medicine

## 2016-03-25 VITALS — BP 140/79 | HR 56 | Temp 97.9°F | Resp 16 | Wt 248.0 lb

## 2016-03-25 DIAGNOSIS — G8929 Other chronic pain: Secondary | ICD-10-CM | POA: Diagnosis not present

## 2016-03-25 DIAGNOSIS — M542 Cervicalgia: Secondary | ICD-10-CM

## 2016-03-25 DIAGNOSIS — E119 Type 2 diabetes mellitus without complications: Secondary | ICD-10-CM | POA: Diagnosis not present

## 2016-03-25 DIAGNOSIS — E8881 Metabolic syndrome: Secondary | ICD-10-CM

## 2016-03-25 LAB — POCT GLYCOSYLATED HEMOGLOBIN (HGB A1C): HEMOGLOBIN A1C: 6.3

## 2016-03-25 LAB — GLUCOSE, POCT (MANUAL RESULT ENTRY): POC GLUCOSE: 127 mg/dL — AB (ref 70–99)

## 2016-03-25 MED ORDER — CYCLOBENZAPRINE HCL 5 MG PO TABS: ORAL_TABLET | ORAL | Status: DC
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
## 2016-03-31 ENCOUNTER — Telehealth: Payer: Self-pay

## 2016-03-31 NOTE — Telephone Encounter (Signed)
PT IS NEEDING TO TALK WITH DR DAUB ABOUT THE IMAGING INFORMATION THAT HE DROPPED OFF ON 03/30/16

## 2016-04-01 NOTE — Telephone Encounter (Signed)
Did you get these?

## 2016-04-01 NOTE — Telephone Encounter (Signed)
I have not seen any records that have been dropped off. Please check and see where these might be or ask [name] to bring by another copy.

## 2016-06-17 ENCOUNTER — Ambulatory Visit (INDEPENDENT_AMBULATORY_CARE_PROVIDER_SITE_OTHER): Payer: 59 | Admitting: Cardiology

## 2016-06-17 ENCOUNTER — Encounter: Payer: Self-pay | Admitting: Cardiology

## 2016-06-17 VITALS — BP 117/74 | HR 54 | Ht 68.0 in | Wt 244.6 lb

## 2016-06-17 DIAGNOSIS — R9431 Abnormal electrocardiogram [ECG] [EKG]: Secondary | ICD-10-CM

## 2016-06-17 DIAGNOSIS — E669 Obesity, unspecified: Secondary | ICD-10-CM

## 2016-06-17 DIAGNOSIS — E784 Other hyperlipidemia: Secondary | ICD-10-CM

## 2016-06-17 DIAGNOSIS — I10 Essential (primary) hypertension: Secondary | ICD-10-CM

## 2016-06-17 DIAGNOSIS — E785 Hyperlipidemia, unspecified: Secondary | ICD-10-CM

## 2016-06-17 DIAGNOSIS — E8881 Metabolic syndrome: Secondary | ICD-10-CM

## 2016-06-17 NOTE — Patient Instructions (Signed)
Your physician wants you to follow-up in: ONE YEAR WITH DR HARDING You will receive a reminder letter in the mail two months in advance. If you don't receive a letter, please call our office to schedule the follow-up appointment.   If you need a refill on your cardiac medications before your next appointment, please call your pharmacy.  
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
## 2016-06-19 ENCOUNTER — Encounter: Payer: Self-pay | Admitting: Cardiology

## 2016-06-19 NOTE — Assessment & Plan Note (Signed)
The patient understands the need to lose weight with diet and exercise. We have discussed specific strategies for this.  

## 2016-06-19 NOTE — Assessment & Plan Note (Signed)
Excellent control today. He takes beta blocker and ACE inhibitor. Actually the beta blocker is a beta blocker-component. Stable with no change.

## 2016-06-19 NOTE — Assessment & Plan Note (Signed)
Still at high risk for coronary disease with obesity, glucose intolerance and low HDL. He also has hypertension. Stressed the importance of continuing to work on trying to lose weight. He done very well for a while, but has plateaued. He hopes to have his home treadmill set up soon which will allow him to stay active even during inclement, or hot weather.

## 2016-06-19 NOTE — Assessment & Plan Note (Signed)
Chronic abnormality. The T-wave inversions in the anterolateral leads are chronic.

## 2016-06-19 NOTE — Assessment & Plan Note (Signed)
Still on simvastatin. Last set of labs had excellent LDL. HDL is still low, but this is the setting of very low total cholesterol. Needs continued diet and excised weight loss. The only way to get his HDL is probably exercise. I think any medications really help.

## 2016-07-22 ENCOUNTER — Other Ambulatory Visit (INDEPENDENT_AMBULATORY_CARE_PROVIDER_SITE_OTHER): Payer: 59

## 2016-07-22 ENCOUNTER — Encounter: Payer: Self-pay | Admitting: Internal Medicine

## 2016-07-22 ENCOUNTER — Ambulatory Visit (INDEPENDENT_AMBULATORY_CARE_PROVIDER_SITE_OTHER): Payer: 59 | Admitting: Internal Medicine

## 2016-07-22 VITALS — BP 122/74 | HR 54 | Temp 98.1°F | Resp 16 | Ht 68.0 in | Wt 245.0 lb

## 2016-07-22 DIAGNOSIS — I10 Essential (primary) hypertension: Secondary | ICD-10-CM

## 2016-07-22 DIAGNOSIS — J4520 Mild intermittent asthma, uncomplicated: Secondary | ICD-10-CM | POA: Diagnosis not present

## 2016-07-22 DIAGNOSIS — J45909 Unspecified asthma, uncomplicated: Secondary | ICD-10-CM

## 2016-07-22 DIAGNOSIS — E785 Hyperlipidemia, unspecified: Secondary | ICD-10-CM | POA: Diagnosis not present

## 2016-07-22 DIAGNOSIS — M170 Bilateral primary osteoarthritis of knee: Secondary | ICD-10-CM

## 2016-07-22 DIAGNOSIS — R768 Other specified abnormal immunological findings in serum: Secondary | ICD-10-CM

## 2016-07-22 DIAGNOSIS — Z23 Encounter for immunization: Secondary | ICD-10-CM

## 2016-07-22 DIAGNOSIS — R894 Abnormal immunological findings in specimens from other organs, systems and tissues: Secondary | ICD-10-CM

## 2016-07-22 DIAGNOSIS — R7303 Prediabetes: Secondary | ICD-10-CM | POA: Insufficient documentation

## 2016-07-22 DIAGNOSIS — R7689 Other specified abnormal immunological findings in serum: Secondary | ICD-10-CM

## 2016-07-22 LAB — CBC WITH DIFFERENTIAL/PLATELET
Basophils Absolute: 0 10*3/uL (ref 0.0–0.1)
Basophils Relative: 0.7 % (ref 0.0–3.0)
EOS PCT: 4.8 % (ref 0.0–5.0)
Eosinophils Absolute: 0.2 10*3/uL (ref 0.0–0.7)
HCT: 44.5 % (ref 39.0–52.0)
Hemoglobin: 15.4 g/dL (ref 13.0–17.0)
LYMPHS ABS: 1.7 10*3/uL (ref 0.7–4.0)
Lymphocytes Relative: 34.2 % (ref 12.0–46.0)
MCHC: 34.5 g/dL (ref 30.0–36.0)
MCV: 86.9 fl (ref 78.0–100.0)
MONO ABS: 0.7 10*3/uL (ref 0.1–1.0)
MONOS PCT: 14.7 % — AB (ref 3.0–12.0)
NEUTROS ABS: 2.3 10*3/uL (ref 1.4–7.7)
NEUTROS PCT: 45.6 % (ref 43.0–77.0)
PLATELETS: 180 10*3/uL (ref 150.0–400.0)
RBC: 5.13 Mil/uL (ref 4.22–5.81)
RDW: 13.9 % (ref 11.5–15.5)
WBC: 5 10*3/uL (ref 4.0–10.5)

## 2016-07-22 LAB — COMPREHENSIVE METABOLIC PANEL
ALT: 18 U/L (ref 0–53)
AST: 20 U/L (ref 0–37)
Albumin: 4.6 g/dL (ref 3.5–5.2)
Alkaline Phosphatase: 76 U/L (ref 39–117)
BUN: 16 mg/dL (ref 6–23)
CALCIUM: 9.4 mg/dL (ref 8.4–10.5)
CHLORIDE: 106 meq/L (ref 96–112)
CO2: 26 meq/L (ref 19–32)
CREATININE: 1.24 mg/dL (ref 0.40–1.50)
GFR: 73.67 mL/min (ref >60.00)
Glucose, Bld: 102 mg/dL — ABNORMAL HIGH (ref 70–99)
POTASSIUM: 4.1 meq/L (ref 3.5–5.1)
SODIUM: 139 meq/L (ref 135–145)
Total Bilirubin: 0.6 mg/dL (ref 0.2–1.2)
Total Protein: 7.4 g/dL (ref 6.0–8.3)

## 2016-07-22 LAB — LIPID PANEL
CHOL/HDL RATIO: 3
Cholesterol: 108 mg/dL (ref 0–200)
HDL: 31 mg/dL — ABNORMAL LOW (ref >39.00)
LDL CALC: 58 mg/dL (ref 0–99)
NonHDL: 76.92
TRIGLYCERIDES: 95 mg/dL (ref 0.0–149.0)
VLDL: 19 mg/dL (ref 0.0–40.0)

## 2016-07-22 LAB — HEMOGLOBIN A1C: Hgb A1c MFr Bld: 6.3 % (ref 4.6–6.5)

## 2016-07-22 LAB — TSH: TSH: 2.33 u[IU]/mL (ref 0.35–4.50)

## 2016-07-22 MED ORDER — BISOPROLOL-HYDROCHLOROTHIAZIDE 10-6.25 MG PO TABS: 1.0000 | ORAL_TABLET | Freq: Every day | ORAL | 1 refills | Status: DC

## 2016-07-22 MED ORDER — ALBUTEROL SULFATE HFA 108 (90 BASE) MCG/ACT IN AERS: 2.0000 | INHALATION_SPRAY | RESPIRATORY_TRACT | 8 refills | Status: DC | PRN
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
## 2016-07-22 NOTE — Patient Instructions (Signed)
  Test(s) ordered today. Your results will be released to MyChart (or called to you) after review, usually within 72hours after test completion. If any changes need to be made, you will be notified at that same time.  All other Health Maintenance issues reviewed.   All recommended immunizations and age-appropriate screenings are up-to-date or discussed.  Flu vaccine administered today.   Medications reviewed and updated.  No changes recommended at this time.  Your prescription(s) have been submitted to your pharmacy. Please take as directed and contact our office if you believe you are having problem(s) with the medication(s).   Please followup in 6 months   

## 2016-07-22 NOTE — Assessment & Plan Note (Signed)
Uses albuterol inhaler as needed ?

## 2016-07-22 NOTE — Assessment & Plan Note (Addendum)
Advised him to stop taking the [facility] powder Work on weight loss

## 2016-07-22 NOTE — Assessment & Plan Note (Signed)
Check lipid panel Taking simvastatin  - continue

## 2016-07-22 NOTE — Assessment & Plan Note (Signed)
Check a1c Work on weight loss Continue low sugar/carb diet

## 2016-07-22 NOTE — Progress Notes (Signed)
Pre visit review using our clinic review tool, if applicable. No additional management support is needed unless otherwise documented below in the visit note. 

## 2016-07-22 NOTE — Assessment & Plan Note (Addendum)
BP well controlled Current regimen effective and well tolerated Continue current medications at current doses cmp  

## 2016-07-23 LAB — HEPATITIS B SURFACE ANTIGEN: HEP B S AG: NEGATIVE

## 2016-07-23 LAB — HEPATITIS B SURFACE ANTIBODY,QUALITATIVE

## 2016-07-24 ENCOUNTER — Other Ambulatory Visit: Payer: Self-pay | Admitting: Cardiology

## 2016-07-26 NOTE — Telephone Encounter (Signed)
Rx request sent to pharmacy.  

## 2016-08-03 ENCOUNTER — Other Ambulatory Visit: Payer: Self-pay | Admitting: Emergency Medicine

## 2016-09-16 ENCOUNTER — Telehealth: Payer: Self-pay | Admitting: Emergency Medicine

## 2016-09-16 NOTE — Telephone Encounter (Signed)
Pt called and stated he is a diabetic. He was sent down for lab work on 07/22/16. He is now being billed $151.50 for it. He states the coding needs to be changed so that his insurance will cover it. Can you take a look at this and give him a call back. Thanks.

## 2016-09-20 NOTE — Telephone Encounter (Signed)
This patient does not have a balance with [place]. [employer] paid for all services we provided on 07/22/16. Is the bill he is referring to from [place]? If so, I need his invoice number from this bill to be able to call them.

## 2016-09-20 NOTE — Telephone Encounter (Signed)
Pt states the Invoice number is [id number]. 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 He states the bill is due 09/23/16 so he is hoping to get this taken care. Thanks.

## 2016-10-03 IMAGING — CR DG CERVICAL SPINE 2 OR 3 VIEWS
3 series · 3 of 3 positions shown · non-contrast
Comparison: None.

CLINICAL DATA: Right-sided neck pain.

EXAM:
CERVICAL SPINE - 2-3 VIEW

[AP]
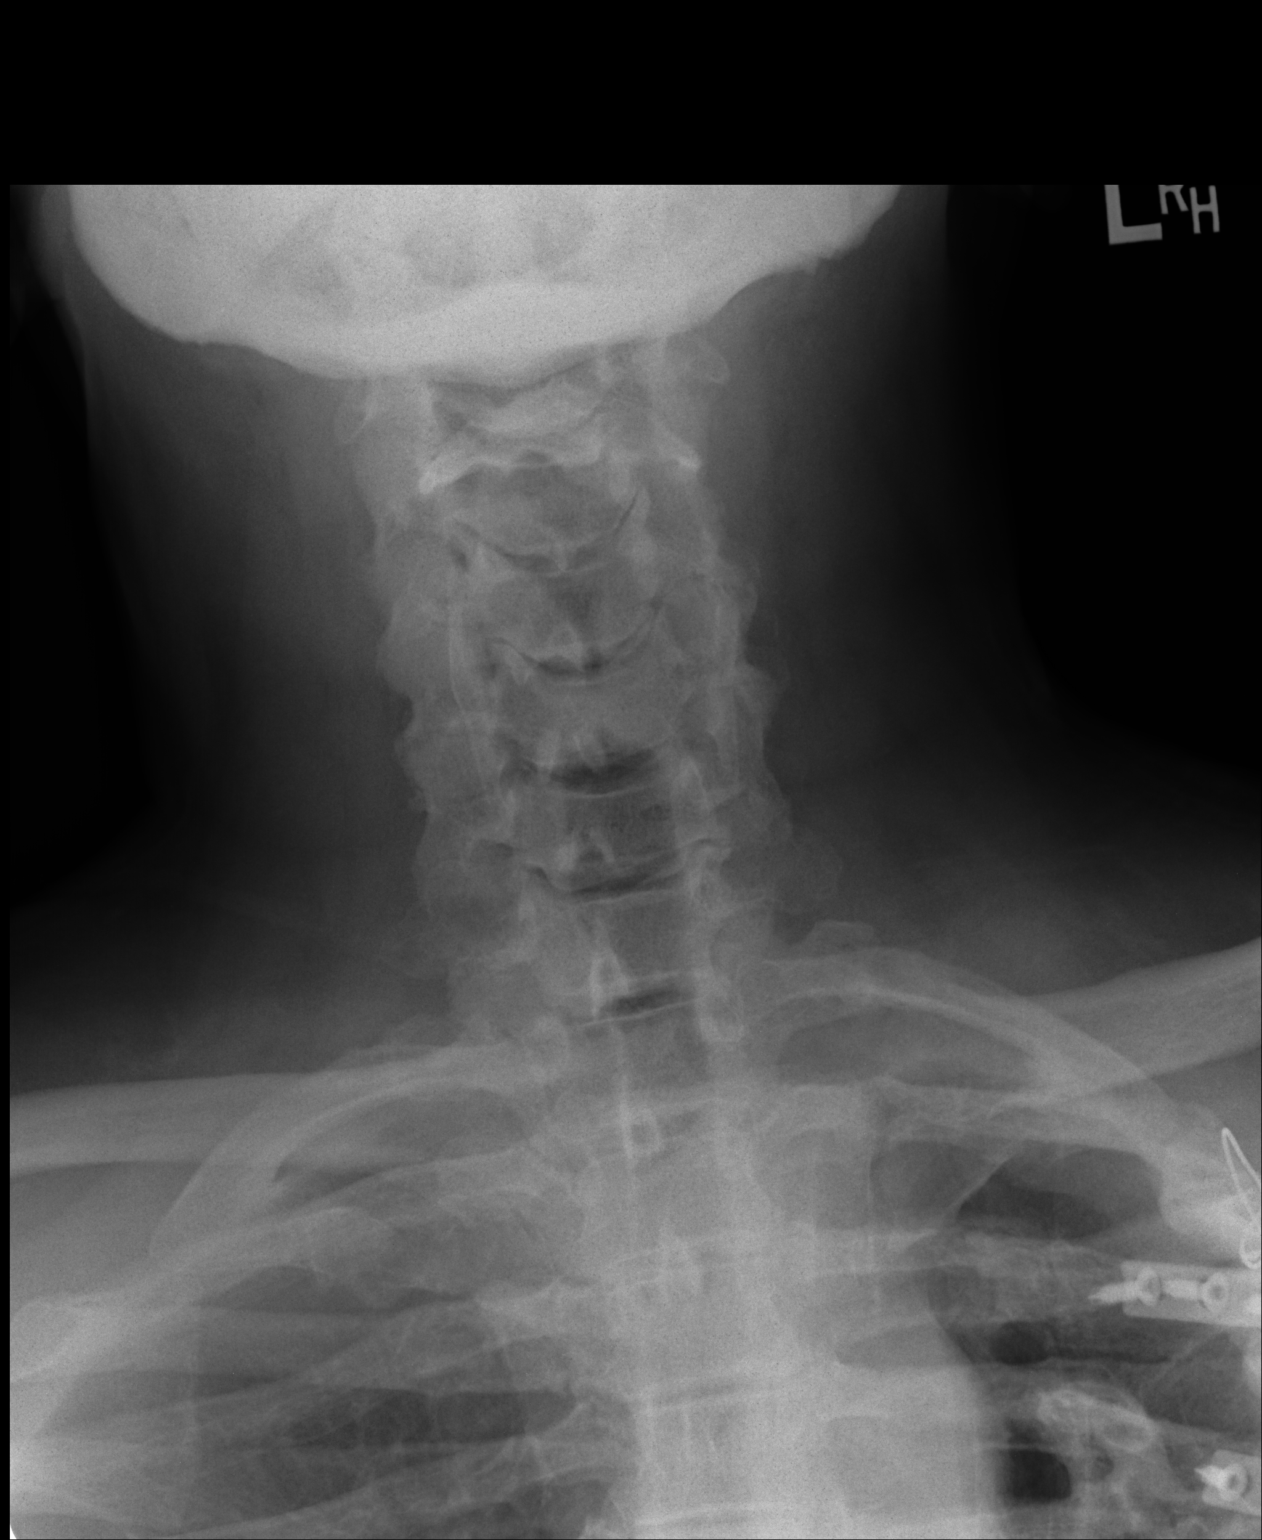

[other]
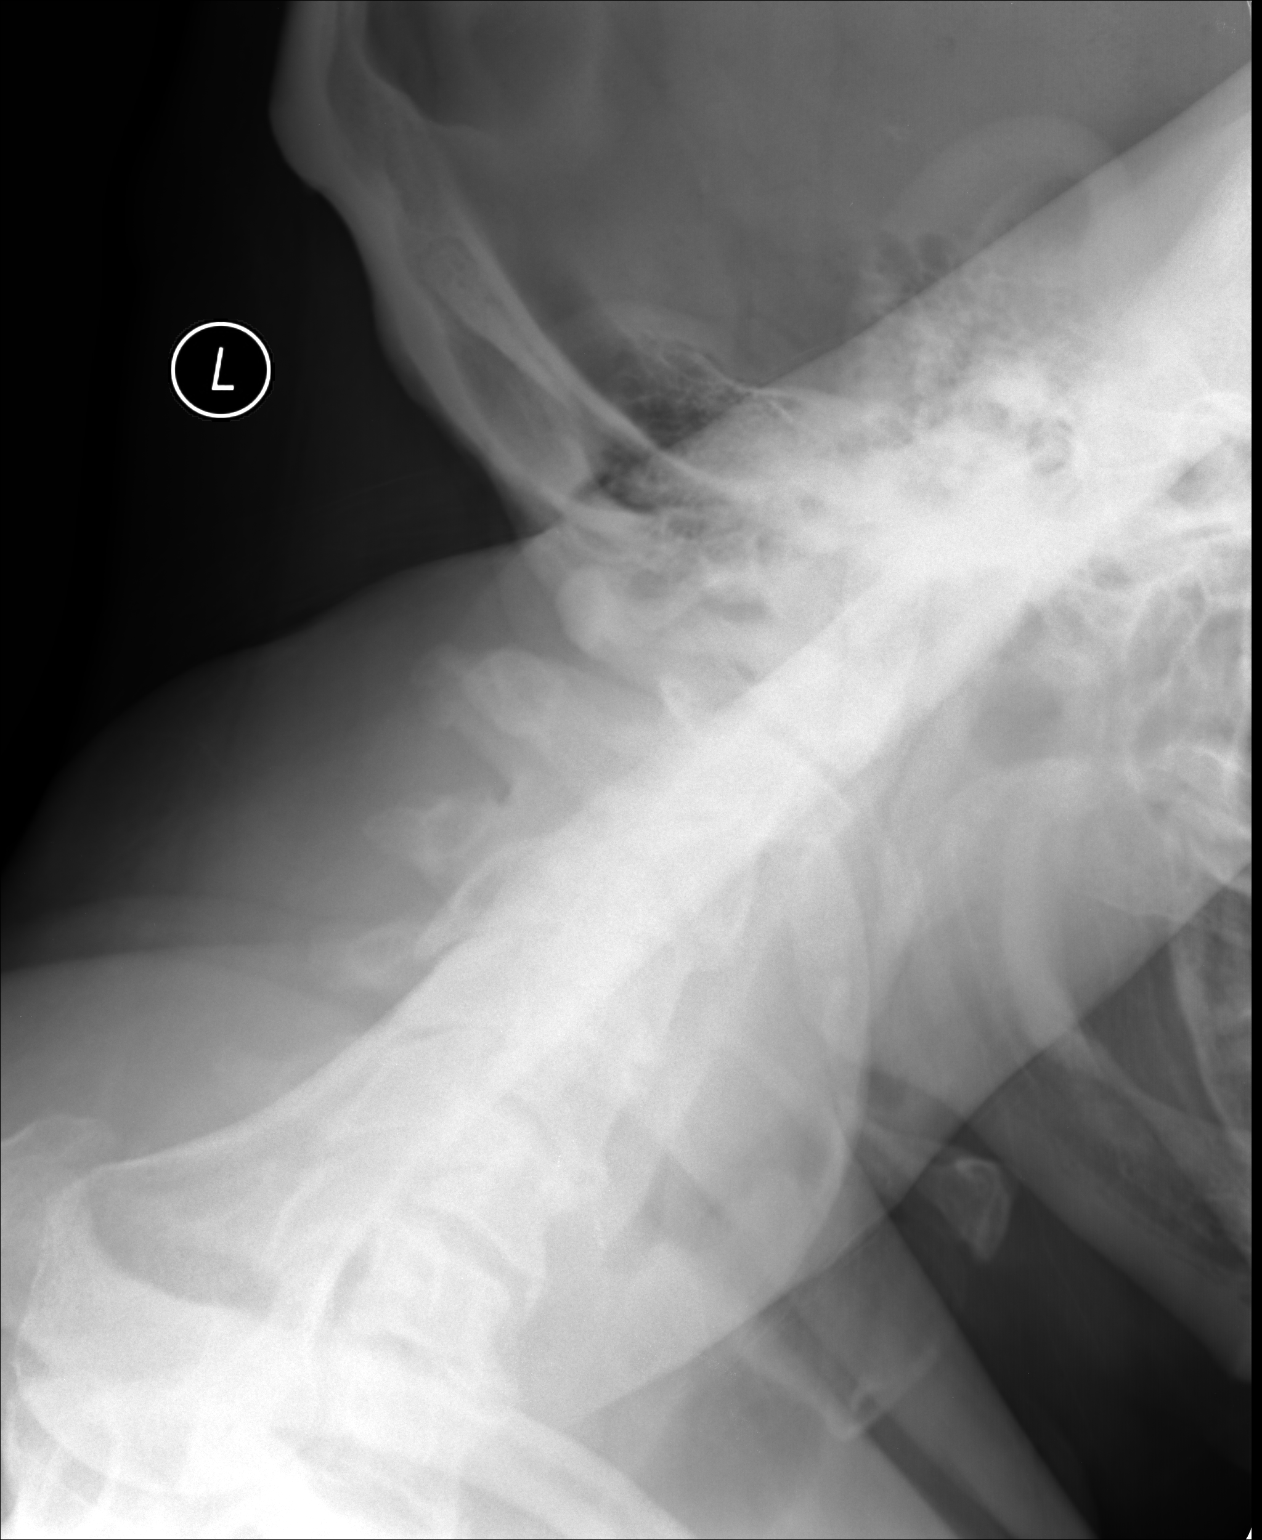

[lateral]
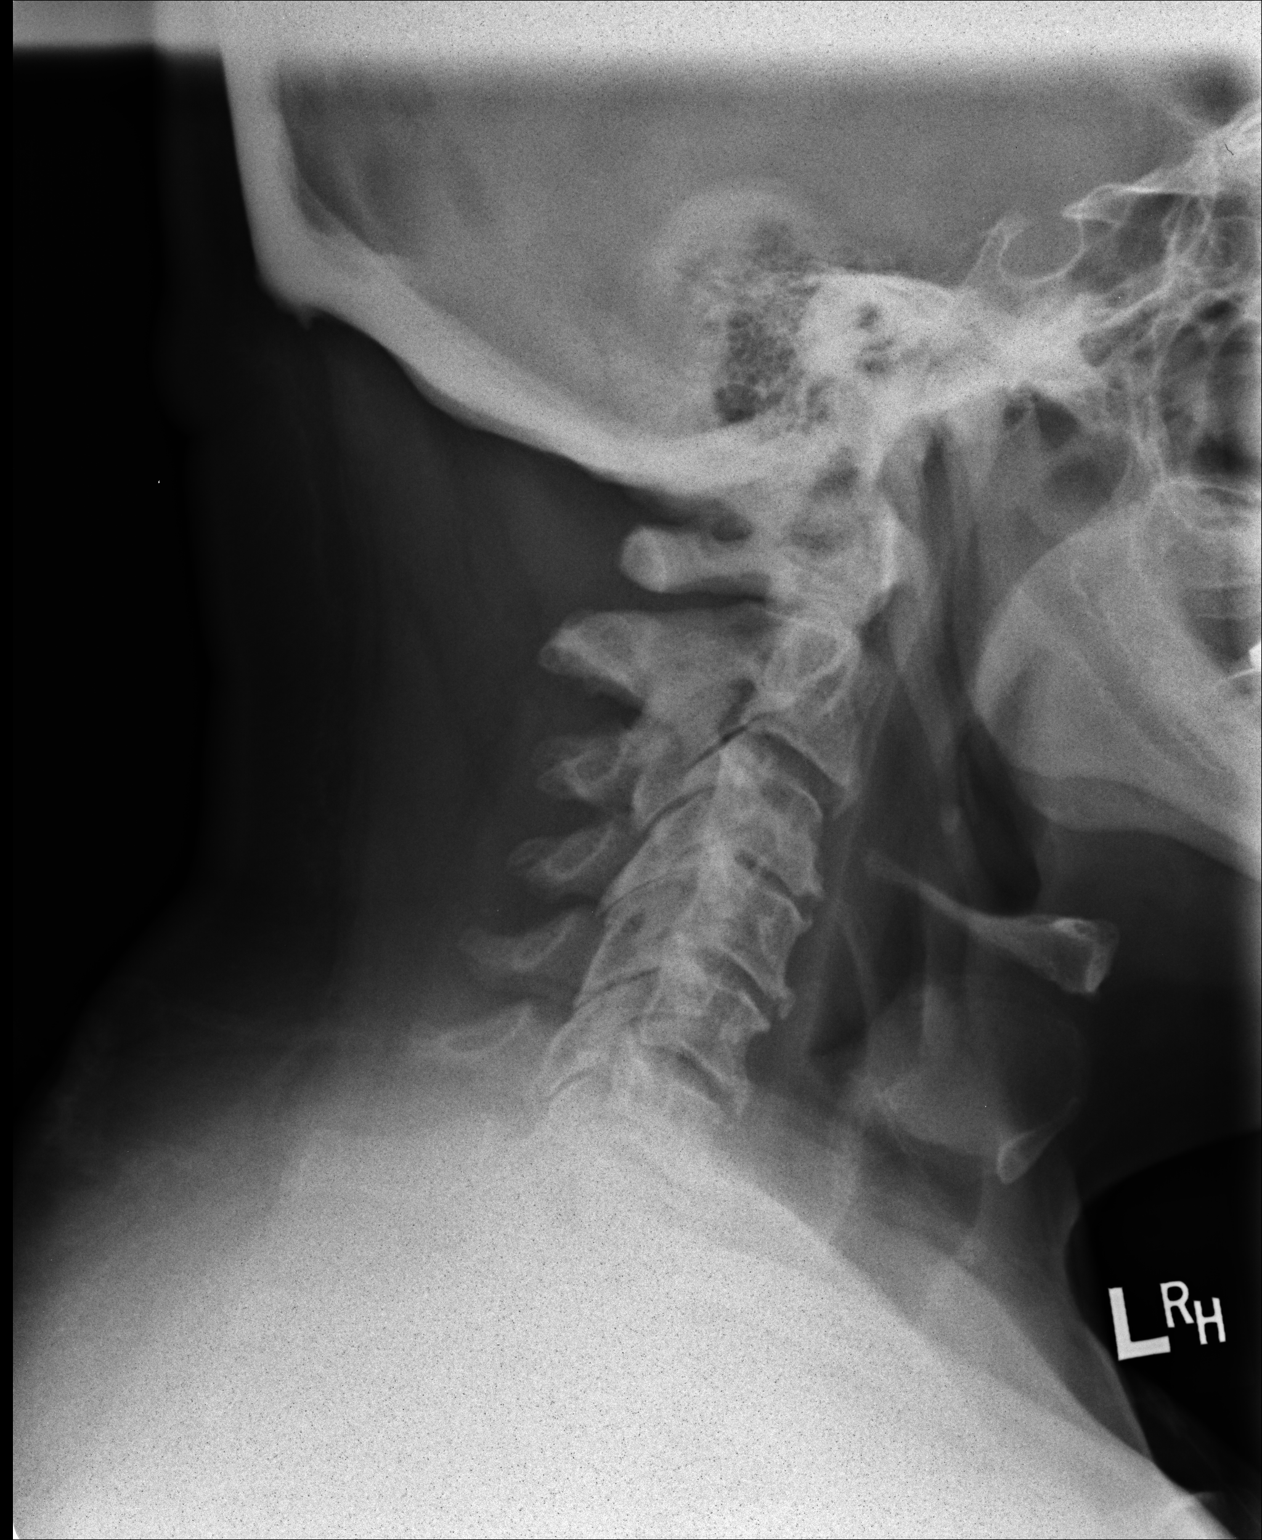

[3 of 3 positions shown; findings below may reference images not displayed]

FINDINGS: No fracture. No spondylolisthesis. There is straightening of the
normal cervical lordosis.

Moderate loss of disc height at C3-C4 with mild loss of disc height
from C4-C5 through C6-C7 there are endplate osteophytes from C2
through C7.

Soft tissues are unremarkable.
IMPRESSION: 1. No fracture or acute finding.
2. Degenerative changes as described.

## 2016-10-03 NOTE — Telephone Encounter (Signed)
Please contact [facility] for clarity on coding and [employer] response, then Dawn in coding if needed.

## 2016-10-08 ENCOUNTER — Ambulatory Visit (INDEPENDENT_AMBULATORY_CARE_PROVIDER_SITE_OTHER): Payer: 59 | Admitting: Family Medicine

## 2016-10-08 VITALS — BP 150/78 | HR 76 | Temp 98.3°F | Resp 16 | Ht 68.0 in | Wt 245.0 lb

## 2016-10-08 DIAGNOSIS — R05 Cough: Secondary | ICD-10-CM | POA: Diagnosis not present

## 2016-10-08 DIAGNOSIS — J0110 Acute frontal sinusitis, unspecified: Secondary | ICD-10-CM | POA: Diagnosis not present

## 2016-10-08 DIAGNOSIS — R059 Cough, unspecified: Secondary | ICD-10-CM

## 2016-10-08 MED ORDER — HYDROCOD POLST-CPM POLST ER 10-8 MG/5ML PO SUER: 5.0000 mL | Freq: Two times a day (BID) | ORAL | 0 refills | Status: DC | PRN

## 2016-10-08 MED ORDER — AMOXICILLIN-POT CLAVULANATE 875-125 MG PO TABS: 1.0000 | ORAL_TABLET | Freq: Two times a day (BID) | ORAL | 0 refills | Status: DC

## 2016-10-08 NOTE — Progress Notes (Signed)
Patient ID: [name], male    DOB: 04-17-1946, 70 y.o.   MRN: [id number]  PCP: [name], MD  Chief Complaint  Patient presents with  . Sinusitis  . Cough  . Fever    Unspecified    Subjective:   HPI 70 year old male presents for evaluation sinus congestion, cough, and subjective fever times 6 days. Patient is familiar to [facility]. Reports sore throat, although continues to drink and eat without difficulty.  Throat soreness has improved compared to when illness began. Currently experiencing some hoarseness. Headache intermittently in the middle of forehead over the last 5 days. Cough is productive with yellow sputum. Reports chest tenderness with cough. He has taken Mucinex which he reports has thinned his mucus. Reports subjective fever and denies nausea or vomiting.  Social History   Social History  . Marital status: Married    Spouse name: N/A  . Number of children: N/A  . Years of education: N/A   Occupational History  . Not on file.   Social History Main Topics  . Smoking status: Former Smoker    Packs/day: 0.25    Years: 16.00    Types: Cigarettes    Start date: 06/25/1957    Quit date: 06/25/1973  . Smokeless tobacco: Never Used  . Alcohol use No  . Drug use: No  . Sexual activity: Yes     Comment: married   Other Topics Concern  . Not on file   Social History Narrative   He is married with 1 step-child, 4 grandchildren, 1 great grandchild.    He is trying to get as much exercise as he can, but he is just really having a hard time figuring this and his diet out. He otherwise does not smoke, does not drink.   Family History  Problem Relation Age of Onset  . Stroke Mother   . Hypertension Mother   . Heart disease Maternal Grandmother   . Stroke Maternal Grandmother    Review of Systems See HPI    Patient Active Problem List   Diagnosis Date Noted  . Asthma 07/22/2016  . Prediabetes 07/22/2016  . Abnormal EKG 11/21/2014  . Dyslipidemia  11/21/2014  . Metabolic syndrome 35/32/9924  . Obesity (BMI 30-39.9)   . Essential hypertension 10/27/2011  . Osteoarthritis 10/27/2011  . Disability examination 10/27/2011  . Atherogenic dyslipidemia; low HDL, in setting of metabolic syndrome 26/83/4196  . Renal cysts, acquired, bilateral 10/27/2011     Prior to Admission medications   Medication Sig Start Date End Date Taking? Authorizing Provider  albuterol (PROVENTIL HFA;VENTOLIN HFA) 108 (90 Base) MCG/ACT inhaler Inhale 2 puffs into the lungs every 4 (four) hours as needed for wheezing (cough, shortness of breath or wheezing.). 07/22/16  Yes [name], MD  aspirin 81 MG tablet Take 81 mg by mouth daily.   Yes Historical Provider, MD  bisoprolol-hydrochlorothiazide (ZIAC) 10-6.25 MG tablet Take 1 tablet by mouth daily. 07/22/16  Yes [name], MD  Blood Glucose Monitoring Suppl (BLOOD GLUCOSE METER KIT AND SUPPLIES) Test blood sugar daily. Dx code: 250.00 05/17/14  Yes Heather Elnora Morrison, PA-C  cyclobenzaprine (FLEXERIL) 5 MG tablet One tablet at night 03/25/16  Yes [name], MD  glucose blood test strip Test blood sugar daily. Dx code: 250.00 07/29/15  Yes [name], MD  Lancets MISC Test blood sugar daily. Dx code: 250.00 05/17/14  Yes Heather Elnora Morrison, PA-C  lisinopril (PRINIVIL,ZESTRIL) 2.5 MG tablet TAKE  1 TABLET(2.5 MG) BY MOUTH DAILY 07/26/16  Yes Leonie Man, MD  loratadine (CLARITIN) 10 MG tablet Take 10 mg by mouth daily as needed.    Yes Historical Provider, MD  Misc Natural Products (OSTEO BI-FLEX TRIPLE STRENGTH) TABS Take by mouth.   Yes Historical Provider, MD  Multiple Vitamin (MULTIVITAMIN) capsule Take 1 capsule by mouth daily.   Yes Historical Provider, MD  Naproxen Sodium (ALEVE) 220 MG CAPS Take 1 capsule by mouth as needed.   Yes Historical Provider, MD  nitroGLYCERIN (NITROSTAT) 0.4 MG SL tablet If you need to use this medication please inform your cardiologist and Dr. Everlene Farrier. 09/13/14  Yes Tereasa Coop,  PA-C  simvastatin (ZOCOR) 40 MG tablet TAKE 1/2 TABLET BY MOUTH EVERY NIGHT AT BEDTIME 12/29/15  Yes [name], MD   No Known Allergies    Objective:  Physical Exam  Constitutional: He is oriented to person, place, and time. He appears well-developed and well-nourished.  HENT:  Head: Normocephalic and atraumatic.  Right Ear: External ear normal.  Left Ear: External ear normal.  Nose: Mucosal edema and sinus tenderness present.  Mouth/Throat: Oropharynx is clear and moist.  Eyes: Conjunctivae and EOM are normal. Pupils are equal, round, and reactive to light.  Neck: Normal range of motion. Neck supple.  Cardiovascular: Normal rate, regular rhythm, normal heart sounds and intact distal pulses.   Pulmonary/Chest: Effort normal and breath sounds normal.  Musculoskeletal: Normal range of motion.  Lymphadenopathy:    He has no cervical adenopathy.  Neurological: He is alert and oriented to person, place, and time.  Skin: Skin is warm and dry.  Psychiatric: He has a normal mood and affect. His behavior is normal. Judgment and thought content normal.     Vitals:   10/08/16 0801  BP: (!) 150/78  Pulse: 76  Resp: 16  Temp: 98.3 F (36.8 C)   Assessment & Plan:  1. Acute frontal sinusitis, recurrence not specified Plan: Amoxicillin-Clavulanate  875-125 mg twice daily for 10 days.  2. Cough Plan: Chlorpheniramine-Hydrocodone (Tussionex), 5 ml every 12 hours as needed for cough  Patient confirmed that he hadn't taken his blood pressure medication and that was the reason his blood pressure was elevated.  Return for follow-up as needed.  Carroll Sage. Kenton Kingfisher, MSN, FNP-C [facility]

## 2016-10-08 NOTE — Patient Instructions (Addendum)
Start Augmentin 1 tablet, twice daily for 10 days.  Take Tussionex 5 ml every 12 hours as needed for cough.  IF you received an x-ray today, you will receive an invoice from [facility]. Please contact [facility] at [phone] with questions or concerns regarding your invoice.   IF you received labwork today, you will receive an invoice from [employer]. Please contact [facility] at [phone] with questions or concerns regarding your invoice.   Our billing staff will not be able to assist you with questions regarding bills from these companies.  You will be contacted with the lab results as soon as they are available. The fastest way to get your results is to activate your My Chart account. Instructions are located on the last page of this paperwork. If you have not heard from [place] regarding the results in 2 weeks, please contact this office.     Sinusitis, Adult Sinusitis is soreness and inflammation of your sinuses. Sinuses are hollow spaces in the bones around your face. They are located:  Around your eyes.  In the middle of your forehead.  Behind your nose.  In your cheekbones. Your sinuses and nasal passages are lined with a stringy fluid (mucus). Mucus normally drains out of your sinuses. When your nasal tissues get inflamed or swollen, the mucus can get trapped or blocked so air cannot flow through your sinuses. This lets bacteria, viruses, and funguses grow, and that leads to infection. Follow these instructions at home: Medicines  Take, use, or apply over-the-counter and prescription medicines only as told by your doctor. These may include nasal sprays.  If you were prescribed an antibiotic medicine, take it as told by your doctor. Do not stop taking the antibiotic even if you start to feel better. Hydrate and Humidify  Drink enough water to keep your pee (urine) clear or pale yellow.  Use a cool mist humidifier to keep the  humidity level in your home above 50%.  Breathe in steam for 10-15 minutes, 3-4 times a day or as told by your doctor. You can do this in the bathroom while a hot shower is running.  Try not to spend time in cool or dry air. Rest  Rest as much as possible.  Sleep with your head raised (elevated).  Make sure to get enough sleep each night. General instructions  Put a warm, moist washcloth on your face 3-4 times a day or as told by your doctor. This will help with discomfort.  Wash your hands often with soap and water. If there is no soap and water, use hand sanitizer.  Do not smoke. Avoid being around people who are smoking (secondhand smoke).  Keep all follow-up visits as told by your doctor. This is important. Contact a doctor if:  You have a fever.  Your symptoms get worse.  Your symptoms do not get better within 10 days. Get help right away if:  You have a very bad headache.  You cannot stop throwing up (vomiting).  You have pain or swelling around your face or eyes.  You have trouble seeing.  You feel confused.  Your neck is stiff.  You have trouble breathing. This information is not intended to replace advice given to you by your health care provider. Make sure you discuss any questions you have with your health care provider. Document Released: 04/19/2008 Document Revised: 06/27/2016 Document Reviewed: 08/27/2015 Elsevier Interactive Patient Education  2017 [employer].

## 2016-11-04 ENCOUNTER — Other Ambulatory Visit: Payer: Self-pay | Admitting: Emergency Medicine

## 2017-01-19 ENCOUNTER — Other Ambulatory Visit (INDEPENDENT_AMBULATORY_CARE_PROVIDER_SITE_OTHER): Payer: 59

## 2017-01-19 ENCOUNTER — Encounter: Payer: Self-pay | Admitting: Internal Medicine

## 2017-01-19 ENCOUNTER — Ambulatory Visit (INDEPENDENT_AMBULATORY_CARE_PROVIDER_SITE_OTHER): Payer: 59 | Admitting: Internal Medicine

## 2017-01-19 VITALS — BP 144/72 | HR 53 | Temp 97.9°F | Resp 16 | Ht 68.0 in | Wt 247.0 lb

## 2017-01-19 DIAGNOSIS — R7303 Prediabetes: Secondary | ICD-10-CM

## 2017-01-19 DIAGNOSIS — Z23 Encounter for immunization: Secondary | ICD-10-CM

## 2017-01-19 DIAGNOSIS — I10 Essential (primary) hypertension: Secondary | ICD-10-CM | POA: Diagnosis not present

## 2017-01-19 DIAGNOSIS — M542 Cervicalgia: Secondary | ICD-10-CM | POA: Insufficient documentation

## 2017-01-19 DIAGNOSIS — E785 Hyperlipidemia, unspecified: Secondary | ICD-10-CM | POA: Diagnosis not present

## 2017-01-19 DIAGNOSIS — J4520 Mild intermittent asthma, uncomplicated: Secondary | ICD-10-CM | POA: Diagnosis not present

## 2017-01-19 DIAGNOSIS — J45909 Unspecified asthma, uncomplicated: Secondary | ICD-10-CM | POA: Diagnosis not present

## 2017-01-19 DIAGNOSIS — E669 Obesity, unspecified: Secondary | ICD-10-CM | POA: Diagnosis not present

## 2017-01-19 LAB — LIPID PANEL
CHOL/HDL RATIO: 3
Cholesterol: 86 mg/dL (ref 0–200)
HDL: 27.4 mg/dL — AB (ref >39.00)
LDL Cholesterol: 41 mg/dL (ref 0–99)
NONHDL: 58.91
TRIGLYCERIDES: 91 mg/dL (ref 0.0–149.0)
VLDL: 18.2 mg/dL (ref 0.0–40.0)

## 2017-01-19 LAB — HEMOGLOBIN A1C: Hgb A1c MFr Bld: 6.4 % (ref 4.6–6.5)

## 2017-01-19 LAB — COMPREHENSIVE METABOLIC PANEL
ALT: 20 U/L (ref 0–53)
AST: 24 U/L (ref 0–37)
Albumin: 4.4 g/dL (ref 3.5–5.2)
Alkaline Phosphatase: 71 U/L (ref 39–117)
BUN: 16 mg/dL (ref 6–23)
CHLORIDE: 109 meq/L (ref 96–112)
CO2: 26 meq/L (ref 19–32)
Calcium: 9.9 mg/dL (ref 8.4–10.5)
Creatinine, Ser: 1.22 mg/dL (ref 0.40–1.50)
GFR: 74.96 mL/min (ref >60.00)
GLUCOSE: 102 mg/dL — AB (ref 70–99)
POTASSIUM: 4.1 meq/L (ref 3.5–5.1)
Sodium: 141 mEq/L (ref 135–145)
Total Bilirubin: 0.6 mg/dL (ref 0.2–1.2)
Total Protein: 7.1 g/dL (ref 6.0–8.3)

## 2017-01-19 MED ORDER — ALBUTEROL SULFATE HFA 108 (90 BASE) MCG/ACT IN AERS: 2.0000 | INHALATION_SPRAY | RESPIRATORY_TRACT | 8 refills | Status: DC | PRN
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
## 2017-01-19 NOTE — Assessment & Plan Note (Signed)
Well controlled at home, slightly elevated here No changes today in medication continue to monitor at home CMP today

## 2017-01-19 NOTE — Assessment & Plan Note (Signed)
Using albuterol slightly more He thinks it is from PND, cool air Taking claritin only as needed - start taking regularly Hopefully claritin will decrease inhaler use - if not he will call me.

## 2017-01-19 NOTE — Assessment & Plan Note (Signed)
Check lipid panel  Continue daily statin Regular exercise and healthy diet encouraged  

## 2017-01-19 NOTE — Assessment & Plan Note (Signed)
No radiculopathy symptoms Pain likely related to arthritis in addition to muscular pain Discussed referral to physical therapy or specialist-needed. Can continue over-the-counter anti-inflammatories

## 2017-01-19 NOTE — Progress Notes (Signed)
Pre visit review using our clinic review tool, if applicable. No additional management support is needed unless otherwise documented below in the visit note. 

## 2017-01-19 NOTE — Assessment & Plan Note (Signed)
Stressed regular exercise - 5 times a week Decrease portions Healthy diet

## 2017-01-19 NOTE — Patient Instructions (Addendum)
  Test(s) ordered today. Your results will be released to [facility] (or called to you) after review, usually within 72hours after test completion. If any changes need to be made, you will be notified at that same time.  All other Health Maintenance issues reviewed.   All recommended immunizations and age-appropriate screenings are up-to-date or discussed.  Pneumovax immunization administered today.   Medications reviewed and updated.  No changes recommended at this time.  Your prescription(s) have been submitted to your pharmacy. Please take as directed and contact our office if you believe you are having problem(s) with the medication(s).   Please followup in 6 months for a physical

## 2017-01-19 NOTE — Assessment & Plan Note (Signed)
Check a1c Low sugar / carb diet Stressed regular exercise, weight loss  

## 2017-01-25 ENCOUNTER — Other Ambulatory Visit: Payer: Self-pay | Admitting: Internal Medicine

## 2017-01-31 IMAGING — US US RENAL
1 series · 14 of 25 positions shown · non-contrast
Comparison: CT of the abdomen and pelvis 12/22/2011, CT of the
abdomen and pelvis 06/09/2007, 11/22/2006, and ultrasound the
abdomen 11/14/2006

CLINICAL DATA: Followup bilateral renal cysts. Diabetes and
hypertension.

EXAM:
RENAL / URINARY TRACT ULTRASOUND COMPLETE

[Series 1: us renal · 0.25mm/px · 14 of 45 slices shown]
[im 1/45]
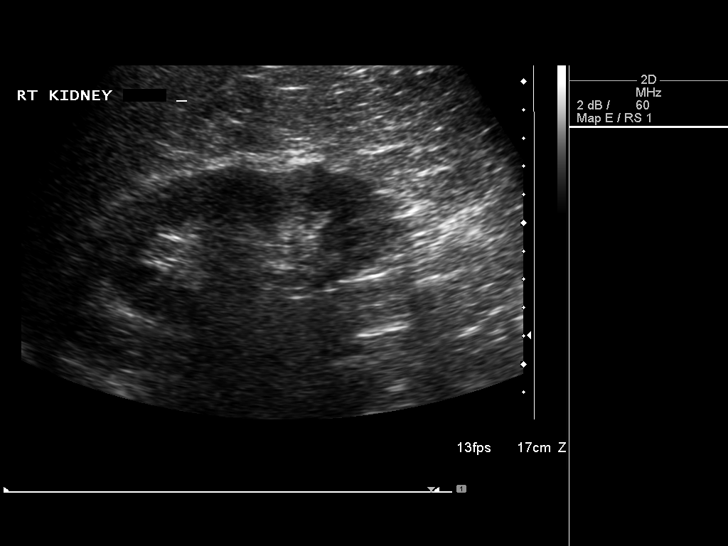
[im 4/45]
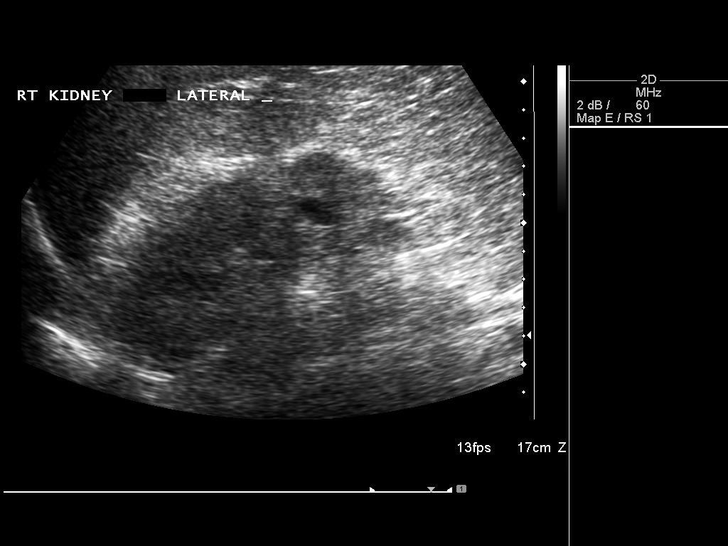
[im 8/45]
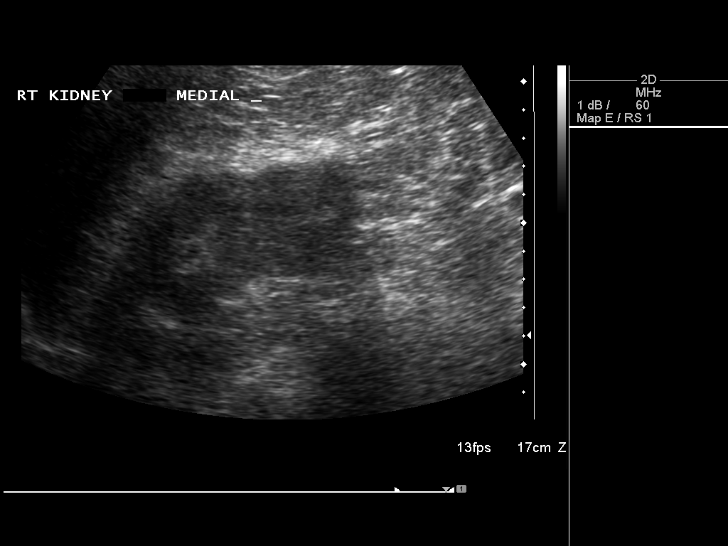
[im 12/45]
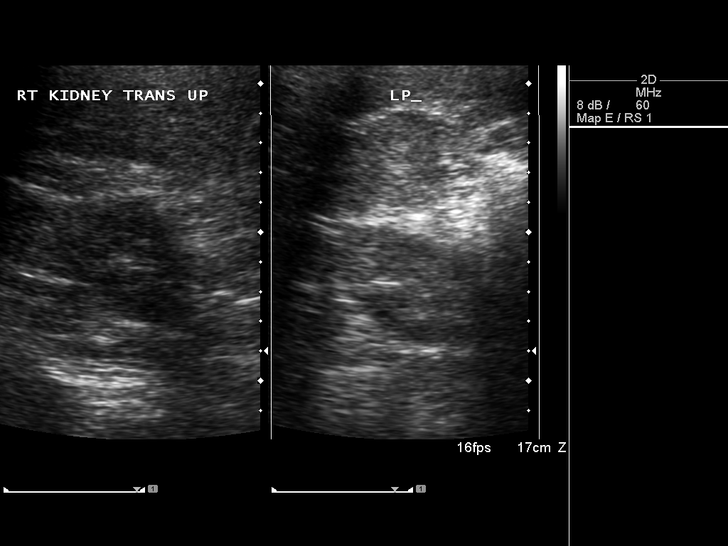
[im 15/45]
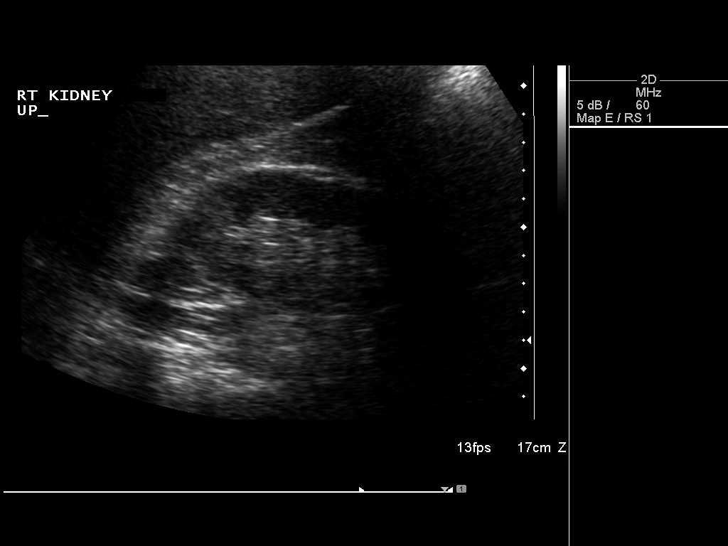
[im 17/45]
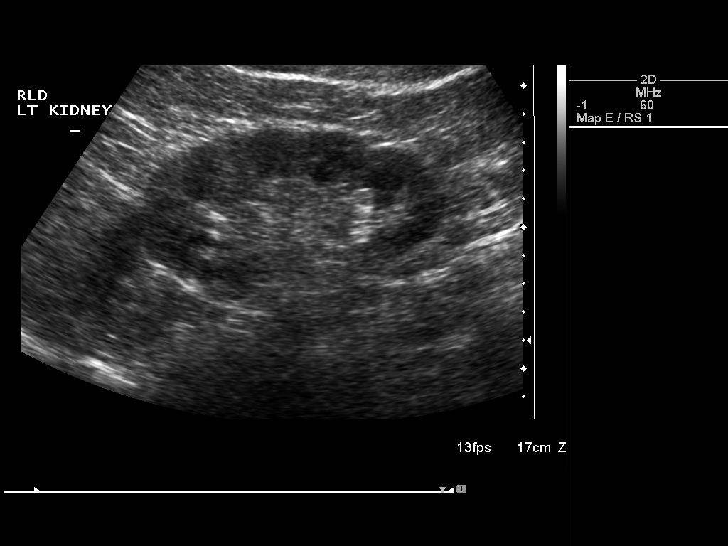
[im 21/45]
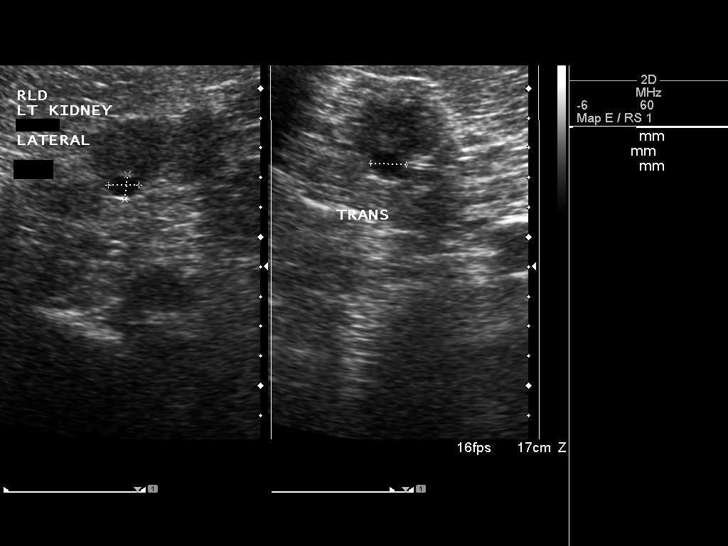
[im 24/45]
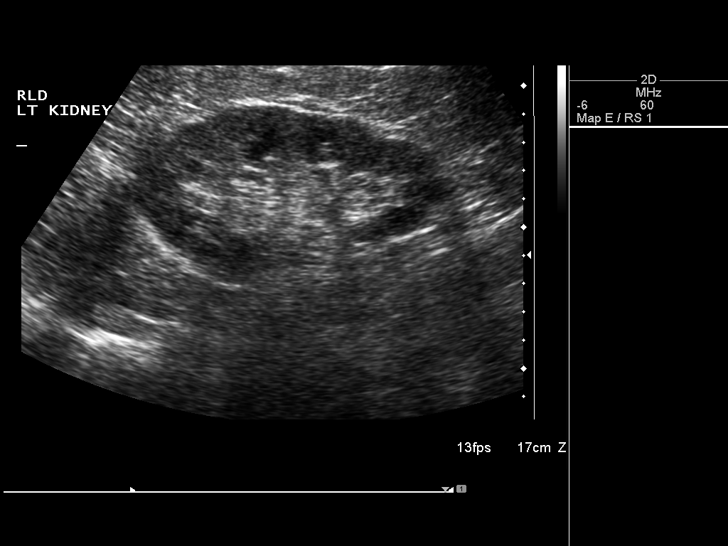
[im 28/45]
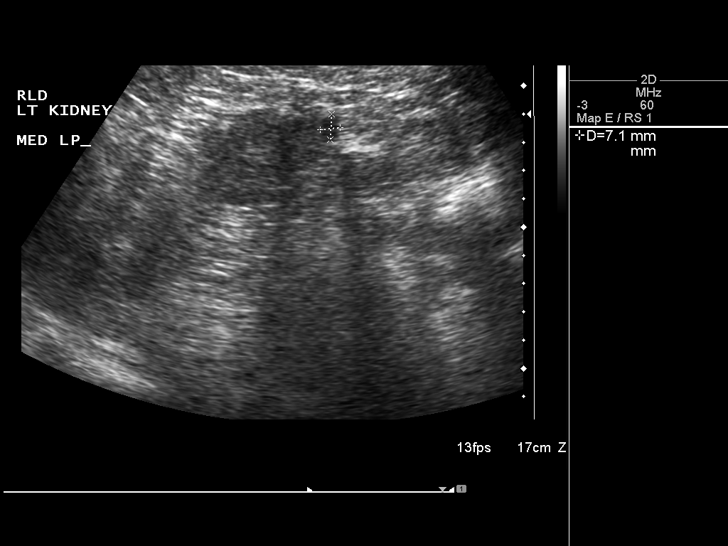
[im 30/45]
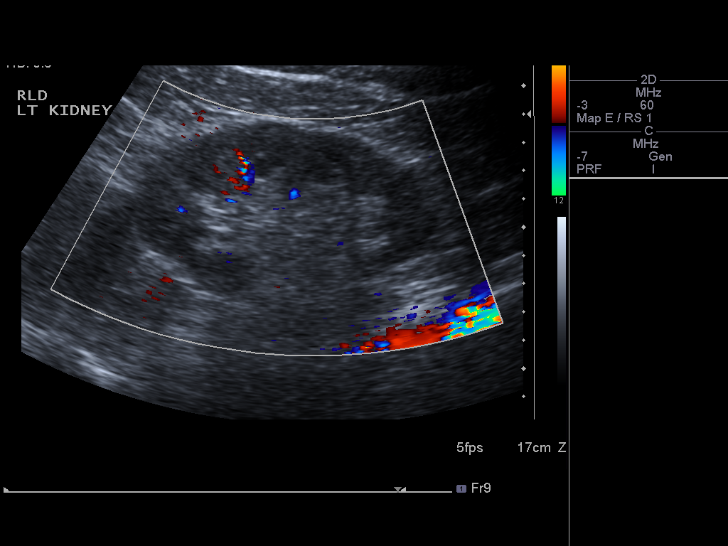
[im 34/45]
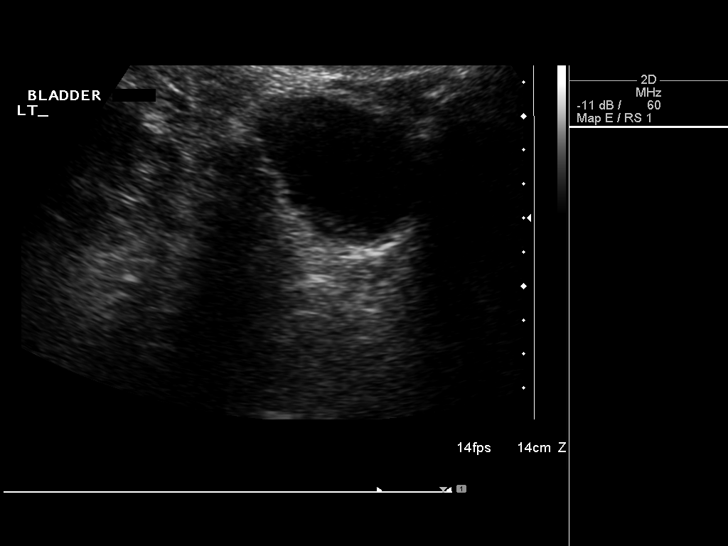
[im 37/45]
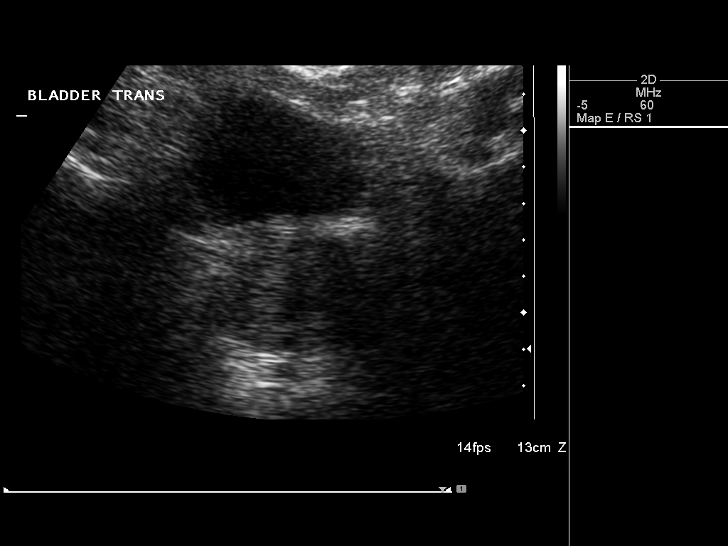
[im 41/45]
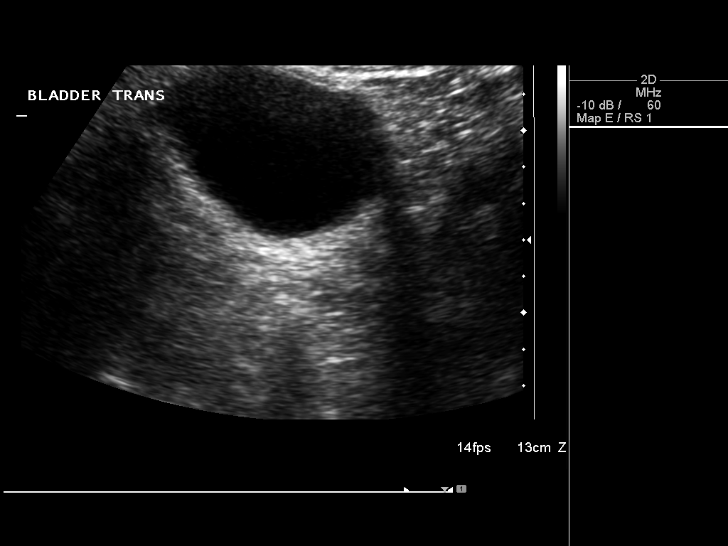
[im 45/45]
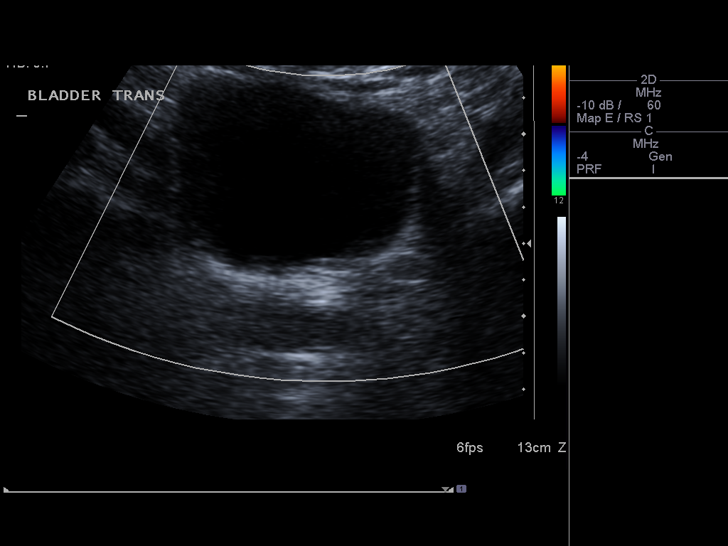

[14 of 25 positions shown; findings below may reference images not displayed]

FINDINGS: Right Kidney:

Length: 11.3 cm. Renal echogenicity appears normal. Small cyst is
identified in the mid to lower pole region left kidney measuring
x 1.4 x 1.2 cm. No hydronephrosis or solid mass.

Left Kidney:

Length: 1.5 cm. Renal echogenicity appears normal. Small cyst is
identified in the midpole region and measures 1.0 x 0.8 x 1.2 cm.
Small exophytic cyst is identified in the lower pole region
measuring 0.7 x 0.9 x 0.8 cm. No hydronephrosis or solid renal mass
identified.

Bladder:

Appears normal for degree of bladder distention.

Additional:

Prostate appears enlarged.
IMPRESSION: 1. No hydronephrosis or solid mass.
2. Small anechoic cysts are identified bilaterally.
3. Given the stability of these benign findings, no specific
follow-up for these lesions is felt to be necessary.

## 2017-03-09 ENCOUNTER — Other Ambulatory Visit: Payer: Self-pay | Admitting: Emergency Medicine

## 2017-03-09 DIAGNOSIS — E119 Type 2 diabetes mellitus without complications: Secondary | ICD-10-CM

## 2017-03-17 LAB — HM DIABETES EYE EXAM

## 2017-03-24 ENCOUNTER — Encounter: Payer: Self-pay | Admitting: Internal Medicine

## 2017-06-18 ENCOUNTER — Other Ambulatory Visit: Payer: Self-pay | Admitting: Internal Medicine

## 2017-07-08 ENCOUNTER — Ambulatory Visit (INDEPENDENT_AMBULATORY_CARE_PROVIDER_SITE_OTHER): Payer: 59 | Admitting: Cardiology

## 2017-07-08 ENCOUNTER — Encounter: Payer: Self-pay | Admitting: Cardiology

## 2017-07-08 VITALS — BP 122/78 | HR 51 | Ht 68.0 in | Wt 249.6 lb

## 2017-07-08 DIAGNOSIS — R9431 Abnormal electrocardiogram [ECG] [EKG]: Secondary | ICD-10-CM

## 2017-07-08 DIAGNOSIS — E669 Obesity, unspecified: Secondary | ICD-10-CM | POA: Diagnosis not present

## 2017-07-08 DIAGNOSIS — I10 Essential (primary) hypertension: Secondary | ICD-10-CM

## 2017-07-08 DIAGNOSIS — E785 Hyperlipidemia, unspecified: Secondary | ICD-10-CM | POA: Diagnosis not present

## 2017-07-08 DIAGNOSIS — E8881 Metabolic syndrome: Secondary | ICD-10-CM

## 2017-07-08 DIAGNOSIS — E784 Other hyperlipidemia: Secondary | ICD-10-CM | POA: Diagnosis not present

## 2017-07-08 NOTE — Patient Instructions (Signed)
NO CHANGE WITH CURRENT MEDICATIONS     Your physician wants you to follow-up in 12 MONTH WITH DR HARDING. You will receive a reminder letter in the mail two months in advance. If you don't receive a letter, please call our office to schedule the follow-up appointment.     If you need a refill on your cardiac medications before your next appointment, please call your pharmacy.  
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
## 2017-07-10 ENCOUNTER — Encounter: Payer: Self-pay | Admitting: Cardiology

## 2017-07-10 NOTE — Assessment & Plan Note (Signed)
Abs recently checked by PCP. Well-controlled. HDL is still low which still gives him points toward metabolic syndrome, however his total cholesterol is extremely low. I question the significance of low HDL in the setting. - Consider checking NMR panel on next f/u.  Continue Simvastatin for now.

## 2017-07-10 NOTE — Assessment & Plan Note (Signed)
Trying to lose weight, but just not doing well. He needs to do both exercise and dietary modification, and he has a hard time with the diet concept.

## 2017-07-10 NOTE — Assessment & Plan Note (Signed)
Well-controlled current dose of bisopropril-HCTZ and lisinopril. No changes necessary.

## 2017-07-10 NOTE — Assessment & Plan Note (Signed)
This now is clearly a chronic abnormality with lateral T-wave inversions that are persistent. Not likely associated with ischemia, as he is totally asymptomatic.

## 2017-07-10 NOTE — Assessment & Plan Note (Signed)
With combination of obesity, low HDL, hypertension and diabetes, he clearly needs criteria for metabolic syndrome. We therefore following him up annually, and being aggressive with his lipid modification and blood pressure control. PCP is monitoring and controlling his diabetes.

## 2017-07-21 NOTE — Patient Instructions (Addendum)
Test(s) ordered today. Your results will be released to [facility] (or called to you) after review, usually within 72hours after test completion. If any changes need to be made, you will be notified at that same time.  All other Health Maintenance issues reviewed.   All recommended immunizations and age-appropriate screenings are up-to-date or discussed.  Flu immunization administered today.  Get the new shingles vaccine.   Medications reviewed and updated.  No changes recommended at this time.   A referral was ordered for urology.  Please followup in 6 months    Health Maintenance, Male A healthy lifestyle and preventive care is important for your health and wellness. Ask your health care provider about what schedule of regular examinations is right for you. What should I know about weight and diet? Eat a Healthy Diet  Eat plenty of vegetables, fruits, whole grains, low-fat dairy products, and lean protein.  Do not eat a lot of foods high in solid fats, added sugars, or salt.  Maintain a Healthy Weight Regular exercise can help you achieve or maintain a healthy weight. You should:  Do at least 150 minutes of exercise each week. The exercise should increase your heart rate and make you sweat (moderate-intensity exercise).  Do strength-training exercises at least twice a week.  Watch Your Levels of Cholesterol and Blood Lipids  Have your blood tested for lipids and cholesterol every 5 years starting at 73 years of age. If you are at high risk for heart disease, you should start having your blood tested when you are 73 years old. You may need to have your cholesterol levels checked more often if: ? Your lipid or cholesterol levels are high. ? You are older than 73 years of age. ? You are at high risk for heart disease.  What should I know about cancer screening? Many types of cancers can be detected early and may often be prevented. Lung Cancer  You should be screened every  year for lung cancer if: ? You are a current smoker who has smoked for at least 30 years. ? You are a former smoker who has quit within the past 15 years.  Talk to your health care provider about your screening options, when you should start screening, and how often you should be screened.  Colorectal Cancer  Routine colorectal cancer screening usually begins at 73 years of age and should be repeated every 5-10 years until you are 73 years old. You may need to be screened more often if early forms of precancerous polyps or small growths are found. Your health care provider may recommend screening at an earlier age if you have risk factors for colon cancer.  Your health care provider may recommend using home test kits to check for hidden blood in the stool.  A small camera at the end of a tube can be used to examine your colon (sigmoidoscopy or colonoscopy). This checks for the earliest forms of colorectal cancer.  Prostate and Testicular Cancer  Depending on your age and overall health, your health care provider may do certain tests to screen for prostate and testicular cancer.  Talk to your health care provider about any symptoms or concerns you have about testicular or prostate cancer.  Skin Cancer  Check your skin from head to toe regularly.  Tell your health care provider about any new moles or changes in moles, especially if: ? There is a change in a mole's size, shape, or color. ? You have a mole that  is larger than a pencil eraser.  Always use sunscreen. Apply sunscreen liberally and repeat throughout the day.  Protect yourself by wearing long sleeves, pants, a wide-brimmed hat, and sunglasses when outside.  What should I know about heart disease, diabetes, and high blood pressure?  If you are 27-22 years of age, have your blood pressure checked every 3-5 years. If you are 73 years of age or older, have your blood pressure checked every year. You should have your blood  pressure measured twice-once when you are at a hospital or clinic, and once when you are not at a hospital or clinic. Record the average of the two measurements. To check your blood pressure when you are not at a hospital or clinic, you can use: ? An automated blood pressure machine at a pharmacy. ? A home blood pressure monitor.  Talk to your health care provider about your target blood pressure.  If you are between 33-39 years old, ask your health care provider if you should take aspirin to prevent heart disease.  Have regular diabetes screenings by checking your fasting blood sugar level. ? If you are at a normal weight and have a low risk for diabetes, have this test once every three years after the age of 31. ? If you are overweight and have a high risk for diabetes, consider being tested at a younger age or more often.  A one-time screening for abdominal aortic aneurysm (AAA) by ultrasound is recommended for men aged 46-75 years who are current or former smokers. What should I know about preventing infection? Hepatitis B If you have a higher risk for hepatitis B, you should be screened for this virus. Talk with your health care provider to find out if you are at risk for hepatitis B infection. Hepatitis C Blood testing is recommended for:  Everyone born from [birth year] through 1967.  Anyone with known risk factors for hepatitis C.  Sexually Transmitted Diseases (STDs)  You should be screened each year for STDs including gonorrhea and chlamydia if: ? You are sexually active and are younger than 73 years of age. ? You are older than 73 years of age and your health care provider tells you that you are at risk for this type of infection. ? Your sexual activity has changed since you were last screened and you are at an increased risk for chlamydia or gonorrhea. Ask your health care provider if you are at risk.  Talk with your health care provider about whether you are at high risk of being  infected with HIV. Your health care provider may recommend a prescription medicine to help prevent HIV infection.  What else can I do?  Schedule regular health, dental, and eye exams.  Stay current with your vaccines (immunizations).  Do not use any tobacco products, such as cigarettes, chewing tobacco, and e-cigarettes. If you need help quitting, ask your health care provider.  Limit alcohol intake to no more than 2 drinks per day. One drink equals 12 ounces of beer, 5 ounces of wine, or 1 ounces of hard liquor.  Do not use street drugs.  Do not share needles.  Ask your health care provider for help if you need support or information about quitting drugs.  Tell your health care provider if you often feel depressed.  Tell your health care provider if you have ever been abused or do not feel safe at home. This information is not intended to replace advice given to you by your health  care provider. Make sure you discuss any questions you have with your health care provider. Document Released: 04/29/2008 Document Revised: 06/30/2016 Document Reviewed: 08/05/2015 Elsevier Interactive Patient Education  [employer].
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
## 2017-07-22 ENCOUNTER — Encounter: Payer: Self-pay | Admitting: Internal Medicine

## 2017-07-22 ENCOUNTER — Ambulatory Visit (INDEPENDENT_AMBULATORY_CARE_PROVIDER_SITE_OTHER): Payer: 59 | Admitting: Internal Medicine

## 2017-07-22 ENCOUNTER — Other Ambulatory Visit (INDEPENDENT_AMBULATORY_CARE_PROVIDER_SITE_OTHER): Payer: 59

## 2017-07-22 VITALS — BP 138/72 | HR 57 | Temp 97.6°F | Resp 16 | Ht 68.0 in | Wt 246.0 lb

## 2017-07-22 DIAGNOSIS — Z23 Encounter for immunization: Secondary | ICD-10-CM

## 2017-07-22 DIAGNOSIS — N281 Cyst of kidney, acquired: Secondary | ICD-10-CM | POA: Diagnosis not present

## 2017-07-22 DIAGNOSIS — I10 Essential (primary) hypertension: Secondary | ICD-10-CM

## 2017-07-22 DIAGNOSIS — R350 Frequency of micturition: Secondary | ICD-10-CM | POA: Diagnosis not present

## 2017-07-22 DIAGNOSIS — R7303 Prediabetes: Secondary | ICD-10-CM

## 2017-07-22 DIAGNOSIS — E785 Hyperlipidemia, unspecified: Secondary | ICD-10-CM

## 2017-07-22 DIAGNOSIS — N529 Male erectile dysfunction, unspecified: Secondary | ICD-10-CM | POA: Insufficient documentation

## 2017-07-22 DIAGNOSIS — Z0000 Encounter for general adult medical examination without abnormal findings: Secondary | ICD-10-CM | POA: Diagnosis not present

## 2017-07-22 DIAGNOSIS — Z125 Encounter for screening for malignant neoplasm of prostate: Secondary | ICD-10-CM

## 2017-07-22 DIAGNOSIS — J4520 Mild intermittent asthma, uncomplicated: Secondary | ICD-10-CM | POA: Diagnosis not present

## 2017-07-22 LAB — COMPREHENSIVE METABOLIC PANEL
ALK PHOS: 81 U/L (ref 39–117)
ALT: 22 U/L (ref 0–53)
AST: 24 U/L (ref 0–37)
Albumin: 4.5 g/dL (ref 3.5–5.2)
BILIRUBIN TOTAL: 0.5 mg/dL (ref 0.2–1.2)
BUN: 17 mg/dL (ref 6–23)
CO2: 28 meq/L (ref 19–32)
CREATININE: 1.31 mg/dL (ref 0.40–1.50)
Calcium: 9.8 mg/dL (ref 8.4–10.5)
Chloride: 105 mEq/L (ref 96–112)
GFR: 68.96 mL/min (ref >60.00)
GLUCOSE: 115 mg/dL — AB (ref 70–99)
Potassium: 4.5 mEq/L (ref 3.5–5.1)
Sodium: 140 mEq/L (ref 135–145)
TOTAL PROTEIN: 7.1 g/dL (ref 6.0–8.3)

## 2017-07-22 LAB — CBC WITH DIFFERENTIAL/PLATELET
BASOS ABS: 0 10*3/uL (ref 0.0–0.1)
Basophils Relative: 1.1 % (ref 0.0–3.0)
EOS ABS: 0.2 10*3/uL (ref 0.0–0.7)
Eosinophils Relative: 5 % (ref 0.0–5.0)
HEMATOCRIT: 45.1 % (ref 39.0–52.0)
HEMOGLOBIN: 15.1 g/dL (ref 13.0–17.0)
LYMPHS PCT: 36.9 % (ref 12.0–46.0)
Lymphs Abs: 1.6 10*3/uL (ref 0.7–4.0)
MCHC: 33.5 g/dL (ref 30.0–36.0)
MCV: 89 fl (ref 78.0–100.0)
MONO ABS: 0.7 10*3/uL (ref 0.1–1.0)
Monocytes Relative: 15.6 % — ABNORMAL HIGH (ref 3.0–12.0)
Neutro Abs: 1.8 10*3/uL (ref 1.4–7.7)
Neutrophils Relative %: 41.4 % — ABNORMAL LOW (ref 43.0–77.0)
Platelets: 167 10*3/uL (ref 150.0–400.0)
RBC: 5.07 Mil/uL (ref 4.22–5.81)
RDW: 14.5 % (ref 11.5–15.5)
WBC: 4.2 10*3/uL (ref 4.0–10.5)

## 2017-07-22 LAB — LIPID PANEL
CHOL/HDL RATIO: 4
Cholesterol: 108 mg/dL (ref 0–200)
HDL: 28.3 mg/dL — ABNORMAL LOW (ref >39.00)
LDL CALC: 55 mg/dL (ref 0–99)
NonHDL: 79.31
Triglycerides: 121 mg/dL (ref 0.0–149.0)
VLDL: 24.2 mg/dL (ref 0.0–40.0)

## 2017-07-22 LAB — PSA, MEDICARE: PSA: 1.09 ng/mL (ref 0.10–4.00)

## 2017-07-22 LAB — TSH: TSH: 2.74 u[IU]/mL (ref 0.35–4.50)

## 2017-07-22 LAB — HEMOGLOBIN A1C: Hgb A1c MFr Bld: 6.3 % (ref 4.6–6.5)

## 2017-07-22 MED ORDER — LISINOPRIL 2.5 MG PO TABS: ORAL_TABLET | ORAL | 1 refills | Status: DC

## 2017-07-22 MED ORDER — SIMVASTATIN 40 MG PO TABS: ORAL_TABLET | ORAL | 1 refills | Status: DC

## 2017-07-22 MED ORDER — BISOPROLOL-HYDROCHLOROTHIAZIDE 10-6.25 MG PO TABS: 1.0000 | ORAL_TABLET | Freq: Every day | ORAL | 1 refills | Status: DC

## 2017-07-22 NOTE — Assessment & Plan Note (Signed)
Referred to urology

## 2017-07-22 NOTE — Assessment & Plan Note (Signed)
BP well controlled Current regimen effective and well tolerated Continue current medications at current doses cmp  

## 2017-07-22 NOTE — Assessment & Plan Note (Signed)
Check lipid panel  Continue daily statin Regular exercise and healthy diet encouraged  

## 2017-07-22 NOTE — Assessment & Plan Note (Signed)
Mild, intermittent Albuterol prn

## 2017-07-22 NOTE — Assessment & Plan Note (Signed)
Check a1c Low sugar / carb diet Stressed regular exercise, weight loss  

## 2017-07-25 ENCOUNTER — Other Ambulatory Visit: Payer: Self-pay | Admitting: Internal Medicine
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
## 2018-01-18 NOTE — Patient Instructions (Addendum)

## 2018-01-19 ENCOUNTER — Other Ambulatory Visit (INDEPENDENT_AMBULATORY_CARE_PROVIDER_SITE_OTHER): Payer: 59

## 2018-01-19 ENCOUNTER — Ambulatory Visit: Payer: 59 | Admitting: Internal Medicine

## 2018-01-19 ENCOUNTER — Encounter: Payer: Self-pay | Admitting: Internal Medicine

## 2018-01-19 VITALS — BP 128/70 | HR 60 | Temp 97.9°F | Resp 16 | Wt 259.0 lb

## 2018-01-19 DIAGNOSIS — R7303 Prediabetes: Secondary | ICD-10-CM

## 2018-01-19 DIAGNOSIS — I10 Essential (primary) hypertension: Secondary | ICD-10-CM

## 2018-01-19 DIAGNOSIS — J4520 Mild intermittent asthma, uncomplicated: Secondary | ICD-10-CM

## 2018-01-19 DIAGNOSIS — E669 Obesity, unspecified: Secondary | ICD-10-CM | POA: Diagnosis not present

## 2018-01-19 DIAGNOSIS — E785 Hyperlipidemia, unspecified: Secondary | ICD-10-CM

## 2018-01-19 LAB — LIPID PANEL
CHOL/HDL RATIO: 3
Cholesterol: 106 mg/dL (ref 0–200)
HDL: 30.5 mg/dL — AB (ref >39.00)
LDL Cholesterol: 50 mg/dL (ref 0–99)
NONHDL: 75.42
Triglycerides: 125 mg/dL (ref 0.0–149.0)
VLDL: 25 mg/dL (ref 0.0–40.0)

## 2018-01-19 LAB — COMPREHENSIVE METABOLIC PANEL
ALT: 23 U/L (ref 0–53)
AST: 23 U/L (ref 0–37)
Albumin: 4.4 g/dL (ref 3.5–5.2)
Alkaline Phosphatase: 74 U/L (ref 39–117)
BUN: 17 mg/dL (ref 6–23)
CALCIUM: 9.8 mg/dL (ref 8.4–10.5)
CHLORIDE: 106 meq/L (ref 96–112)
CO2: 25 mEq/L (ref 19–32)
CREATININE: 1.28 mg/dL (ref 0.40–1.50)
GFR: 70.73 mL/min (ref >60.00)
GLUCOSE: 115 mg/dL — AB (ref 70–99)
Potassium: 4.2 mEq/L (ref 3.5–5.1)
SODIUM: 138 meq/L (ref 135–145)
Total Bilirubin: 0.6 mg/dL (ref 0.2–1.2)
Total Protein: 7.1 g/dL (ref 6.0–8.3)

## 2018-01-19 LAB — HEMOGLOBIN A1C: HEMOGLOBIN A1C: 6.4 % (ref 4.6–6.5)

## 2018-01-19 NOTE — Assessment & Plan Note (Signed)
Check lipid panel  Continue daily statin Regular exercise and healthy diet encouraged  

## 2018-01-19 NOTE — Assessment & Plan Note (Signed)
Mild, intermittent Albuterol as needed controlled

## 2018-01-19 NOTE — Assessment & Plan Note (Signed)
BP well controlled Current regimen effective and well tolerated Continue current medications at current doses cmp  

## 2018-01-19 NOTE — Assessment & Plan Note (Signed)
Stressed weight loss Decrease portions, eliminate sugar/junk food Increase exercise Get weight down 20 lbs Follow up in 6 months

## 2018-01-19 NOTE — Assessment & Plan Note (Signed)
Check a1c Low sugar / carb diet Stressed regular exercise, weight loss  

## 2018-01-30 ENCOUNTER — Other Ambulatory Visit: Payer: Self-pay | Admitting: Internal Medicine

## 2018-03-21 LAB — HM DIABETES EYE EXAM

## 2018-03-25 ENCOUNTER — Encounter: Payer: Self-pay | Admitting: Internal Medicine

## 2018-04-19 ENCOUNTER — Other Ambulatory Visit: Payer: Self-pay | Admitting: Internal Medicine

## 2018-05-01 ENCOUNTER — Ambulatory Visit: Payer: 59 | Admitting: Internal Medicine

## 2018-05-01 ENCOUNTER — Encounter: Payer: Self-pay | Admitting: Internal Medicine

## 2018-05-01 VITALS — BP 124/66 | HR 59 | Temp 98.4°F | Resp 16 | Wt 254.0 lb

## 2018-05-01 DIAGNOSIS — J069 Acute upper respiratory infection, unspecified: Secondary | ICD-10-CM | POA: Diagnosis not present

## 2018-05-01 NOTE — Patient Instructions (Addendum)
Your infection is likely viral.    You can continue the theraflu nightly.    You can take tylenol 650 mg 4 times a day as needed.   Use your inhaler as needed.  Increase your fluids and rest.     Call if no improvement     Upper Respiratory Infection, Adult Most upper respiratory infections (URIs) are a viral infection of the air passages leading to the lungs. A URI affects the nose, throat, and upper air passages. The most common type of URI is nasopharyngitis and is typically referred to as "the common cold." URIs run their course and usually go away on their own. Most of the time, a URI does not require medical attention, but sometimes a bacterial infection in the upper airways can follow a viral infection. This is called a secondary infection. Sinus and middle ear infections are common types of secondary upper respiratory infections. Bacterial pneumonia can also complicate a URI. A URI can worsen asthma and chronic obstructive pulmonary disease (COPD). Sometimes, these complications can require emergency medical care and may be life threatening. What are the causes? Almost all URIs are caused by viruses. A virus is a type of germ and can spread from one person to another. What increases the risk? You may be at risk for a URI if:  You smoke.  You have chronic heart or lung disease.  You have a weakened defense (immune) system.  You are very young or very old.  You have nasal allergies or asthma.  You work in crowded or poorly ventilated areas.  You work in health care facilities or schools.  What are the signs or symptoms? Symptoms typically develop 2-3 days after you come in contact with a cold virus. Most viral URIs last 7-10 days. However, viral URIs from the influenza virus (flu virus) can last 14-18 days and are typically more severe. Symptoms may include:  Runny or stuffy (congested) nose.  Sneezing.  Cough.  Sore throat.  Headache.  Fatigue.  Fever.  Loss  of appetite.  Pain in your forehead, behind your eyes, and over your cheekbones (sinus pain).  Muscle aches.  How is this diagnosed? Your health care provider may diagnose a URI by:  Physical exam.  Tests to check that your symptoms are not due to another condition such as: ? Strep throat. ? Sinusitis. ? Pneumonia. ? Asthma.  How is this treated? A URI goes away on its own with time. It cannot be cured with medicines, but medicines may be prescribed or recommended to relieve symptoms. Medicines may help:  Reduce your fever.  Reduce your cough.  Relieve nasal congestion.  Follow these instructions at home:  Take medicines only as directed by your health care provider.  Gargle warm saltwater or take cough drops to comfort your throat as directed by your health care provider.  Use a warm mist humidifier or inhale steam from a shower to increase air moisture. This may make it easier to breathe.  Drink enough fluid to keep your urine clear or pale yellow.  Eat soups and other clear broths and maintain good nutrition.  Rest as needed.  Return to work when your temperature has returned to normal or as your health care provider advises. You may need to stay home longer to avoid infecting others. You can also use a face mask and careful hand washing to prevent spread of the virus.  Increase the usage of your inhaler if you have asthma.  Do not  use any tobacco products, including cigarettes, chewing tobacco, or electronic cigarettes. If you need help quitting, ask your health care provider. How is this prevented? The best way to protect yourself from getting a cold is to practice good hygiene.  Avoid oral or hand contact with people with cold symptoms.  Wash your hands often if contact occurs.  There is no clear evidence that vitamin C, vitamin E, echinacea, or exercise reduces the chance of developing a cold. However, it is always recommended to get plenty of rest,  exercise, and practice good nutrition. Contact a health care provider if:  You are getting worse rather than better.  Your symptoms are not controlled by medicine.  You have chills.  You have worsening shortness of breath.  You have brown or red mucus.  You have yellow or brown nasal discharge.  You have pain in your face, especially when you bend forward.  You have a fever.  You have swollen neck glands.  You have pain while swallowing.  You have white areas in the back of your throat. Get help right away if:  You have severe or persistent: ? Headache. ? Ear pain. ? Sinus pain. ? Chest pain.  You have chronic lung disease and any of the following: ? Wheezing. ? Prolonged cough. ? Coughing up blood. ? A change in your usual mucus.  You have a stiff neck.  You have changes in your: ? Vision. ? Hearing. ? Thinking. ? Mood. This information is not intended to replace advice given to you by your health care provider. Make sure you discuss any questions you have with your health care provider. Document Released: 04/27/2001 Document Revised: 07/04/2016 Document Reviewed: 02/06/2014 Elsevier Interactive Patient Education  [employer].
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
## 2018-05-01 NOTE — Assessment & Plan Note (Signed)
Likely viral No antibiotic needed Discussed symptomatic treatment - theraflu and tylenol Rest and fluids Call if no improvement

## 2018-07-06 ENCOUNTER — Other Ambulatory Visit: Payer: Self-pay | Admitting: Internal Medicine

## 2018-07-22 NOTE — Patient Instructions (Addendum)
Test(s) ordered today. Your results will be released to [place] (or called to you) after review, usually within 72hours after test completion. If any changes need to be made, you will be notified at that same time.  All other Health Maintenance issues reviewed.   All recommended immunizations and age-appropriate screenings are up-to-date or discussed.  Flu immunization administered today.    Medications reviewed and updated.  No changes recommended at this time.    Please followup in 6 months     Health Maintenance, Male A healthy lifestyle and preventive care is important for your health and wellness. Ask your health care provider about what schedule of regular examinations is right for you. What should I know about weight and diet? Eat a Healthy Diet  Eat plenty of vegetables, fruits, whole grains, low-fat dairy products, and lean protein.  Do not eat a lot of foods high in solid fats, added sugars, or salt.  Maintain a Healthy Weight Regular exercise can help you achieve or maintain a healthy weight. You should:  Do at least 150 minutes of exercise each week. The exercise should increase your heart rate and make you sweat (moderate-intensity exercise).  Do strength-training exercises at least twice a week.  Watch Your Levels of Cholesterol and Blood Lipids  Have your blood tested for lipids and cholesterol every 5 years starting at 74 years of age. If you are at high risk for heart disease, you should start having your blood tested when you are 74 years old. You may need to have your cholesterol levels checked more often if: ? Your lipid or cholesterol levels are high. ? You are older than 74 years of age. ? You are at high risk for heart disease.  What should I know about cancer screening? Many types of cancers can be detected early and may often be prevented. Lung Cancer  You should be screened every year for lung cancer if: ? You are a current smoker who has smoked  for at least 30 years. ? You are a former smoker who has quit within the past 15 years.  Talk to your health care provider about your screening options, when you should start screening, and how often you should be screened.  Colorectal Cancer  Routine colorectal cancer screening usually begins at 74 years of age and should be repeated every 5-10 years until you are 74 years old. You may need to be screened more often if early forms of precancerous polyps or small growths are found. Your health care provider may recommend screening at an earlier age if you have risk factors for colon cancer.  Your health care provider may recommend using home test kits to check for hidden blood in the stool.  A small camera at the end of a tube can be used to examine your colon (sigmoidoscopy or colonoscopy). This checks for the earliest forms of colorectal cancer.  Prostate and Testicular Cancer  Depending on your age and overall health, your health care provider may do certain tests to screen for prostate and testicular cancer.  Talk to your health care provider about any symptoms or concerns you have about testicular or prostate cancer.  Skin Cancer  Check your skin from head to toe regularly.  Tell your health care provider about any new moles or changes in moles, especially if: ? There is a change in a mole's size, shape, or color. ? You have a mole that is larger than a pencil eraser.  Always use sunscreen.  Apply sunscreen liberally and repeat throughout the day.  Protect yourself by wearing long sleeves, pants, a wide-brimmed hat, and sunglasses when outside.  What should I know about heart disease, diabetes, and high blood pressure?  If you are 67-48 years of age, have your blood pressure checked every 3-5 years. If you are 75 years of age or older, have your blood pressure checked every year. You should have your blood pressure measured twice-once when you are at a hospital or clinic, and  once when you are not at a hospital or clinic. Record the average of the two measurements. To check your blood pressure when you are not at a hospital or clinic, you can use: ? An automated blood pressure machine at a pharmacy. ? A home blood pressure monitor.  Talk to your health care provider about your target blood pressure.  If you are between 7-3 years old, ask your health care provider if you should take aspirin to prevent heart disease.  Have regular diabetes screenings by checking your fasting blood sugar level. ? If you are at a normal weight and have a low risk for diabetes, have this test once every three years after the age of 66. ? If you are overweight and have a high risk for diabetes, consider being tested at a younger age or more often.  A one-time screening for abdominal aortic aneurysm (AAA) by ultrasound is recommended for men aged 17-75 years who are current or former smokers. What should I know about preventing infection? Hepatitis B If you have a higher risk for hepatitis B, you should be screened for this virus. Talk with your health care provider to find out if you are at risk for hepatitis B infection. Hepatitis C Blood testing is recommended for:  Everyone born from [birth year] through 1967.  Anyone with known risk factors for hepatitis C.  Sexually Transmitted Diseases (STDs)  You should be screened each year for STDs including gonorrhea and chlamydia if: ? You are sexually active and are younger than 74 years of age. ? You are older than 74 years of age and your health care provider tells you that you are at risk for this type of infection. ? Your sexual activity has changed since you were last screened and you are at an increased risk for chlamydia or gonorrhea. Ask your health care provider if you are at risk.  Talk with your health care provider about whether you are at high risk of being infected with HIV. Your health care provider may recommend a  prescription medicine to help prevent HIV infection.  What else can I do?  Schedule regular health, dental, and eye exams.  Stay current with your vaccines (immunizations).  Do not use any tobacco products, such as cigarettes, chewing tobacco, and e-cigarettes. If you need help quitting, ask your health care provider.  Limit alcohol intake to no more than 2 drinks per day. One drink equals 12 ounces of beer, 5 ounces of wine, or 1 ounces of hard liquor.  Do not use street drugs.  Do not share needles.  Ask your health care provider for help if you need support or information about quitting drugs.  Tell your health care provider if you often feel depressed.  Tell your health care provider if you have ever been abused or do not feel safe at home. This information is not intended to replace advice given to you by your health care provider. Make sure you discuss any questions you have  with your health care provider. Document Released: 04/29/2008 Document Revised: 06/30/2016 Document Reviewed: 08/05/2015 Elsevier Interactive Patient Education  [employer].
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
## 2018-07-24 ENCOUNTER — Ambulatory Visit (INDEPENDENT_AMBULATORY_CARE_PROVIDER_SITE_OTHER): Payer: 59 | Admitting: Internal Medicine

## 2018-07-24 ENCOUNTER — Other Ambulatory Visit (INDEPENDENT_AMBULATORY_CARE_PROVIDER_SITE_OTHER): Payer: 59

## 2018-07-24 ENCOUNTER — Encounter: Payer: Self-pay | Admitting: Internal Medicine

## 2018-07-24 VITALS — BP 128/62 | HR 56 | Temp 98.1°F | Resp 16 | Ht 68.0 in | Wt 249.0 lb

## 2018-07-24 DIAGNOSIS — J4520 Mild intermittent asthma, uncomplicated: Secondary | ICD-10-CM | POA: Diagnosis not present

## 2018-07-24 DIAGNOSIS — I10 Essential (primary) hypertension: Secondary | ICD-10-CM

## 2018-07-24 DIAGNOSIS — E669 Obesity, unspecified: Secondary | ICD-10-CM

## 2018-07-24 DIAGNOSIS — E785 Hyperlipidemia, unspecified: Secondary | ICD-10-CM | POA: Diagnosis not present

## 2018-07-24 DIAGNOSIS — R7303 Prediabetes: Secondary | ICD-10-CM | POA: Diagnosis not present

## 2018-07-24 DIAGNOSIS — Z0000 Encounter for general adult medical examination without abnormal findings: Secondary | ICD-10-CM | POA: Diagnosis not present

## 2018-07-24 DIAGNOSIS — Z23 Encounter for immunization: Secondary | ICD-10-CM | POA: Diagnosis not present

## 2018-07-24 LAB — TSH: TSH: 3.04 u[IU]/mL (ref 0.35–4.50)

## 2018-07-24 LAB — COMPREHENSIVE METABOLIC PANEL
ALT: 18 U/L (ref 0–53)
AST: 20 U/L (ref 0–37)
Albumin: 4.3 g/dL (ref 3.5–5.2)
Alkaline Phosphatase: 72 U/L (ref 39–117)
BUN: 15 mg/dL (ref 6–23)
CO2: 28 mEq/L (ref 19–32)
Calcium: 9.4 mg/dL (ref 8.4–10.5)
Chloride: 106 mEq/L (ref 96–112)
Creatinine, Ser: 1.2 mg/dL (ref 0.40–1.50)
GFR: 76.09 mL/min (ref >60.00)
Glucose, Bld: 112 mg/dL — ABNORMAL HIGH (ref 70–99)
POTASSIUM: 3.8 meq/L (ref 3.5–5.1)
SODIUM: 141 meq/L (ref 135–145)
Total Bilirubin: 0.6 mg/dL (ref 0.2–1.2)
Total Protein: 6.8 g/dL (ref 6.0–8.3)

## 2018-07-24 LAB — HEMOGLOBIN A1C: HEMOGLOBIN A1C: 6.2 % (ref 4.6–6.5)

## 2018-07-24 LAB — LIPID PANEL
Cholesterol: 92 mg/dL (ref 0–200)
HDL: 32.7 mg/dL — ABNORMAL LOW (ref >39.00)
LDL CALC: 41 mg/dL (ref 0–99)
NonHDL: 59.3
Total CHOL/HDL Ratio: 3
Triglycerides: 94 mg/dL (ref 0.0–149.0)
VLDL: 18.8 mg/dL (ref 0.0–40.0)

## 2018-07-24 LAB — CBC WITH DIFFERENTIAL/PLATELET
Basophils Absolute: 0 10*3/uL (ref 0.0–0.1)
Basophils Relative: 1.1 % (ref 0.0–3.0)
EOS ABS: 0.1 10*3/uL (ref 0.0–0.7)
EOS PCT: 3.4 % (ref 0.0–5.0)
HCT: 41.1 % (ref 39.0–52.0)
Hemoglobin: 14.1 g/dL (ref 13.0–17.0)
Lymphocytes Relative: 33.1 % (ref 12.0–46.0)
Lymphs Abs: 1.2 10*3/uL (ref 0.7–4.0)
MCHC: 34.2 g/dL (ref 30.0–36.0)
MCV: 85.4 fl (ref 78.0–100.0)
MONO ABS: 0.5 10*3/uL (ref 0.1–1.0)
Monocytes Relative: 13.6 % — ABNORMAL HIGH (ref 3.0–12.0)
Neutro Abs: 1.8 10*3/uL (ref 1.4–7.7)
Neutrophils Relative %: 48.8 % (ref 43.0–77.0)
Platelets: 139 10*3/uL — ABNORMAL LOW (ref 150.0–400.0)
RBC: 4.81 Mil/uL (ref 4.22–5.81)
RDW: 14.2 % (ref 11.5–15.5)
WBC: 3.8 10*3/uL — ABNORMAL LOW (ref 4.0–10.5)

## 2018-07-24 NOTE — Assessment & Plan Note (Signed)
Check lipid panel  Continue daily statin Regular exercise and healthy diet encouraged  

## 2018-07-24 NOTE — Assessment & Plan Note (Signed)
Check a1c Low sugar / carb diet Stressed regular exercise, weight loss  

## 2018-07-24 NOTE — Assessment & Plan Note (Signed)
BP well controlled Current regimen effective and well tolerated Continue current medications at current doses cmp  

## 2018-07-24 NOTE — Assessment & Plan Note (Signed)
Mild, intermittent Uses albuterol prn only for occasional wheeze

## 2018-07-24 NOTE — Assessment & Plan Note (Signed)
Has lost weight, encouraged him to continue weight loss efforts Walking - continue Continue low sugar/carb diet

## 2018-10-04 ENCOUNTER — Other Ambulatory Visit: Payer: Self-pay | Admitting: Internal Medicine

## 2018-12-03 ENCOUNTER — Other Ambulatory Visit: Payer: Self-pay | Admitting: Internal Medicine

## 2019-01-02 ENCOUNTER — Other Ambulatory Visit: Payer: Self-pay | Admitting: Internal Medicine

## 2019-01-22 ENCOUNTER — Ambulatory Visit: Payer: 59 | Admitting: Internal Medicine

## 2019-03-12 ENCOUNTER — Other Ambulatory Visit: Payer: Self-pay | Admitting: Internal Medicine

## 2019-03-12 MED ORDER — SIMVASTATIN 40 MG PO TABS: ORAL_TABLET | ORAL | 0 refills | Status: DC

## 2019-04-02 ENCOUNTER — Other Ambulatory Visit: Payer: Self-pay | Admitting: Internal Medicine

## 2019-04-19 DIAGNOSIS — H2513 Age-related nuclear cataract, bilateral: Secondary | ICD-10-CM | POA: Diagnosis not present

## 2019-04-19 DIAGNOSIS — H25013 Cortical age-related cataract, bilateral: Secondary | ICD-10-CM | POA: Diagnosis not present

## 2019-04-19 DIAGNOSIS — E119 Type 2 diabetes mellitus without complications: Secondary | ICD-10-CM | POA: Diagnosis not present

## 2019-04-19 LAB — HM DIABETES EYE EXAM

## 2019-05-02 ENCOUNTER — Other Ambulatory Visit: Payer: Self-pay | Admitting: Internal Medicine

## 2019-05-16 ENCOUNTER — Encounter: Payer: Self-pay | Admitting: Internal Medicine

## 2019-05-28 ENCOUNTER — Telehealth: Payer: Self-pay | Admitting: Internal Medicine

## 2019-05-28 DIAGNOSIS — J45909 Unspecified asthma, uncomplicated: Secondary | ICD-10-CM

## 2019-05-28 MED ORDER — ALBUTEROL SULFATE HFA 108 (90 BASE) MCG/ACT IN AERS: INHALATION_SPRAY | RESPIRATORY_TRACT | 0 refills | Status: DC

## 2019-05-28 NOTE — Telephone Encounter (Signed)
Medication Refill - Medication: albuterol (PROVENTIL HFA;VENTOLIN HFA) 108 (90 Base) MCG/ACT inhaler [[id number]]   Has the patient contacted their pharmacy? No. (Agent: If no, request that the patient contact the pharmacy for the refill.) (Agent: If yes, when and what did the pharmacy advise?)  Preferred Pharmacy (with phone number or street name): [facility]  Agent: Please be advised that RX refills may take up to 3 business days. We ask that you follow-up with your pharmacy.

## 2019-05-31 ENCOUNTER — Other Ambulatory Visit: Payer: Self-pay | Admitting: Internal Medicine

## 2019-06-01 ENCOUNTER — Other Ambulatory Visit: Payer: Self-pay | Admitting: Internal Medicine

## 2019-06-27 DIAGNOSIS — K635 Polyp of colon: Secondary | ICD-10-CM | POA: Diagnosis not present

## 2019-06-27 DIAGNOSIS — Z86010 Personal history of colonic polyps: Secondary | ICD-10-CM | POA: Diagnosis not present

## 2019-06-27 DIAGNOSIS — K648 Other hemorrhoids: Secondary | ICD-10-CM | POA: Diagnosis not present

## 2019-06-27 DIAGNOSIS — D123 Benign neoplasm of transverse colon: Secondary | ICD-10-CM | POA: Diagnosis not present

## 2019-06-27 DIAGNOSIS — K5730 Diverticulosis of large intestine without perforation or abscess without bleeding: Secondary | ICD-10-CM | POA: Diagnosis not present

## 2019-06-29 DIAGNOSIS — K648 Other hemorrhoids: Secondary | ICD-10-CM | POA: Diagnosis not present

## 2019-06-29 DIAGNOSIS — D123 Benign neoplasm of transverse colon: Secondary | ICD-10-CM | POA: Diagnosis not present

## 2019-06-29 DIAGNOSIS — K635 Polyp of colon: Secondary | ICD-10-CM | POA: Diagnosis not present

## 2019-07-01 ENCOUNTER — Other Ambulatory Visit: Payer: Self-pay | Admitting: Internal Medicine

## 2019-07-30 NOTE — Patient Instructions (Addendum)
Tests ordered today. Your results will be released to [place] (or called to you) after review.  If any changes need to be made, you will be notified at that same time.  All other Health Maintenance issues reviewed.   All recommended immunizations and age-appropriate screenings are up-to-date or discussed.  Flu immunization administered today.    Medications reviewed and updated.  Changes include :   none  Your prescription(s) have been submitted to your pharmacy. Please take as directed and contact our office if you believe you are having problem(s) with the medication(s).   Please followup 6 months    Health Maintenance, Male Adopting a healthy lifestyle and getting preventive care are important in promoting health and wellness. Ask your health care provider about:  The right schedule for you to have regular tests and exams.  Things you can do on your own to prevent diseases and keep yourself healthy. What should I know about diet, weight, and exercise? Eat a healthy diet   Eat a diet that includes plenty of vegetables, fruits, low-fat dairy products, and lean protein.  Do not eat a lot of foods that are high in solid fats, added sugars, or sodium. Maintain a healthy weight Body mass index (BMI) is a measurement that can be used to identify possible weight problems. It estimates body fat based on height and weight. Your health care provider can help determine your BMI and help you achieve or maintain a healthy weight. Get regular exercise Get regular exercise. This is one of the most important things you can do for your health. Most adults should:  Exercise for at least 150 minutes each week. The exercise should increase your heart rate and make you sweat (moderate-intensity exercise).  Do strengthening exercises at least twice a week. This is in addition to the moderate-intensity exercise.  Spend less time sitting. Even light physical activity can be beneficial. Watch  cholesterol and blood lipids Have your blood tested for lipids and cholesterol at 75 years of age, then have this test every 5 years. You may need to have your cholesterol levels checked more often if:  Your lipid or cholesterol levels are high.  You are older than 75 years of age.  You are at high risk for heart disease. What should I know about cancer screening? Many types of cancers can be detected early and may often be prevented. Depending on your health history and family history, you may need to have cancer screening at various ages. This may include screening for:  Colorectal cancer.  Prostate cancer.  Skin cancer.  Lung cancer. What should I know about heart disease, diabetes, and high blood pressure? Blood pressure and heart disease  High blood pressure causes heart disease and increases the risk of stroke. This is more likely to develop in people who have high blood pressure readings, are of African descent, or are overweight.  Talk with your health care provider about your target blood pressure readings.  Have your blood pressure checked: ? Every 3-5 years if you are 72-78 years of age. ? Every year if you are 3 years old or older.  If you are between the ages of 74 and 82 and are a current or former smoker, ask your health care provider if you should have a one-time screening for abdominal aortic aneurysm (AAA). Diabetes Have regular diabetes screenings. This checks your fasting blood sugar level. Have the screening done:  Once every three years after age 45 if you are at  a normal weight and have a low risk for diabetes.  More often and at a younger age if you are overweight or have a high risk for diabetes. What should I know about preventing infection? Hepatitis B If you have a higher risk for hepatitis B, you should be screened for this virus. Talk with your health care provider to find out if you are at risk for hepatitis B infection. Hepatitis C Blood  testing is recommended for:  Everyone born from [birth year] through 1967.  Anyone with known risk factors for hepatitis C. Sexually transmitted infections (STIs)  You should be screened each year for STIs, including gonorrhea and chlamydia, if: ? You are sexually active and are younger than 75 years of age. ? You are older than 75 years of age and your health care provider tells you that you are at risk for this type of infection. ? Your sexual activity has changed since you were last screened, and you are at increased risk for chlamydia or gonorrhea. Ask your health care provider if you are at risk.  Ask your health care provider about whether you are at high risk for HIV. Your health care provider may recommend a prescription medicine to help prevent HIV infection. If you choose to take medicine to prevent HIV, you should first get tested for HIV. You should then be tested every 3 months for as long as you are taking the medicine. Follow these instructions at home: Lifestyle  Do not use any products that contain nicotine or tobacco, such as cigarettes, e-cigarettes, and chewing tobacco. If you need help quitting, ask your health care provider.  Do not use street drugs.  Do not share needles.  Ask your health care provider for help if you need support or information about quitting drugs. Alcohol use  Do not drink alcohol if your health care provider tells you not to drink.  If you drink alcohol: ? Limit how much you have to 0-2 drinks a day. ? Be aware of how much alcohol is in your drink. In the [place] one drink equals one 12 oz bottle of beer (355 mL), one 5 oz glass of wine (148 mL), or one 1 oz glass of hard liquor (44 mL). General instructions  Schedule regular health, dental, and eye exams.  Stay current with your vaccines.  Tell your health care provider if: ? You often feel depressed. ? You have ever been abused or do not feel safe at home. Summary  Adopting a healthy  lifestyle and getting preventive care are important in promoting health and wellness.  Follow your health care provider's instructions about healthy diet, exercising, and getting tested or screened for diseases.  Follow your health care provider's instructions on monitoring your cholesterol and blood pressure. This information is not intended to replace advice given to you by your health care provider. Make sure you discuss any questions you have with your health care provider. Document Released: 04/29/2008 Document Revised: 10/25/2018 Document Reviewed: 10/25/2018 Elsevier Patient Education  2020 [employer].
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
## 2019-07-31 ENCOUNTER — Other Ambulatory Visit: Payer: Self-pay | Admitting: Internal Medicine

## 2019-07-31 ENCOUNTER — Encounter: Payer: Self-pay | Admitting: Internal Medicine

## 2019-07-31 ENCOUNTER — Other Ambulatory Visit (INDEPENDENT_AMBULATORY_CARE_PROVIDER_SITE_OTHER): Payer: Medicare Other

## 2019-07-31 ENCOUNTER — Other Ambulatory Visit: Payer: Self-pay

## 2019-07-31 ENCOUNTER — Ambulatory Visit (INDEPENDENT_AMBULATORY_CARE_PROVIDER_SITE_OTHER): Payer: Medicare Other | Admitting: Internal Medicine

## 2019-07-31 VITALS — BP 140/72 | HR 67 | Temp 98.8°F | Resp 16 | Ht 68.0 in | Wt 249.0 lb

## 2019-07-31 DIAGNOSIS — I10 Essential (primary) hypertension: Secondary | ICD-10-CM

## 2019-07-31 DIAGNOSIS — J4520 Mild intermittent asthma, uncomplicated: Secondary | ICD-10-CM | POA: Diagnosis not present

## 2019-07-31 DIAGNOSIS — E669 Obesity, unspecified: Secondary | ICD-10-CM

## 2019-07-31 DIAGNOSIS — E785 Hyperlipidemia, unspecified: Secondary | ICD-10-CM

## 2019-07-31 DIAGNOSIS — Z0000 Encounter for general adult medical examination without abnormal findings: Secondary | ICD-10-CM | POA: Diagnosis not present

## 2019-07-31 DIAGNOSIS — Z23 Encounter for immunization: Secondary | ICD-10-CM | POA: Diagnosis not present

## 2019-07-31 DIAGNOSIS — R7303 Prediabetes: Secondary | ICD-10-CM

## 2019-07-31 DIAGNOSIS — J45909 Unspecified asthma, uncomplicated: Secondary | ICD-10-CM

## 2019-07-31 DIAGNOSIS — M170 Bilateral primary osteoarthritis of knee: Secondary | ICD-10-CM

## 2019-07-31 LAB — COMPREHENSIVE METABOLIC PANEL
ALT: 21 U/L (ref 0–53)
AST: 19 U/L (ref 0–37)
Albumin: 4.5 g/dL (ref 3.5–5.2)
Alkaline Phosphatase: 66 U/L (ref 39–117)
BUN: 15 mg/dL (ref 6–23)
CO2: 27 mEq/L (ref 19–32)
Calcium: 10 mg/dL (ref 8.4–10.5)
Chloride: 107 mEq/L (ref 96–112)
Creatinine, Ser: 1.25 mg/dL (ref 0.40–1.50)
GFR: 68.11 mL/min (ref >60.00)
Glucose, Bld: 97 mg/dL (ref 70–99)
Potassium: 4.2 mEq/L (ref 3.5–5.1)
Sodium: 142 mEq/L (ref 135–145)
Total Bilirubin: 0.5 mg/dL (ref 0.2–1.2)
Total Protein: 7.3 g/dL (ref 6.0–8.3)

## 2019-07-31 LAB — LIPID PANEL
Cholesterol: 100 mg/dL (ref 0–200)
HDL: 33.5 mg/dL — ABNORMAL LOW (ref >39.00)
LDL Cholesterol: 46 mg/dL (ref 0–99)
NonHDL: 66.05
Total CHOL/HDL Ratio: 3
Triglycerides: 102 mg/dL (ref 0.0–149.0)
VLDL: 20.4 mg/dL (ref 0.0–40.0)

## 2019-07-31 LAB — CBC WITH DIFFERENTIAL/PLATELET
Basophils Absolute: 0 10*3/uL (ref 0.0–0.1)
Basophils Relative: 0.8 % (ref 0.0–3.0)
Eosinophils Absolute: 0.1 10*3/uL (ref 0.0–0.7)
Eosinophils Relative: 3.4 % (ref 0.0–5.0)
HCT: 43.1 % (ref 39.0–52.0)
Hemoglobin: 14.6 g/dL (ref 13.0–17.0)
Lymphocytes Relative: 35.5 % (ref 12.0–46.0)
Lymphs Abs: 1.3 10*3/uL (ref 0.7–4.0)
MCHC: 33.9 g/dL (ref 30.0–36.0)
MCV: 86.7 fl (ref 78.0–100.0)
Monocytes Absolute: 0.4 10*3/uL (ref 0.1–1.0)
Monocytes Relative: 11.9 % (ref 3.0–12.0)
Neutro Abs: 1.8 10*3/uL (ref 1.4–7.7)
Neutrophils Relative %: 48.4 % (ref 43.0–77.0)
Platelets: 156 10*3/uL (ref 150.0–400.0)
RBC: 4.97 Mil/uL (ref 4.22–5.81)
RDW: 14.3 % (ref 11.5–15.5)
WBC: 3.7 10*3/uL — ABNORMAL LOW (ref 4.0–10.5)

## 2019-07-31 LAB — TSH: TSH: 3 u[IU]/mL (ref 0.35–4.50)

## 2019-07-31 LAB — HEMOGLOBIN A1C: Hgb A1c MFr Bld: 6.3 % (ref 4.6–6.5)

## 2019-07-31 MED ORDER — ALBUTEROL SULFATE HFA 108 (90 BASE) MCG/ACT IN AERS: INHALATION_SPRAY | RESPIRATORY_TRACT | 0 refills | Status: DC

## 2019-07-31 MED ORDER — BISOPROLOL-HYDROCHLOROTHIAZIDE 10-6.25 MG PO TABS: 1.0000 | ORAL_TABLET | Freq: Every day | ORAL | 1 refills | Status: DC

## 2019-07-31 MED ORDER — LISINOPRIL 2.5 MG PO TABS: ORAL_TABLET | ORAL | 1 refills | Status: DC

## 2019-07-31 MED ORDER — SIMVASTATIN 40 MG PO TABS: ORAL_TABLET | ORAL | 1 refills | Status: DC

## 2019-07-31 NOTE — Assessment & Plan Note (Signed)
With prediabetes, hypertension, hyperlipidemia Encouraged regular exercise, decrease portions and weight loss

## 2019-07-31 NOTE — Assessment & Plan Note (Signed)
Taking BC powder daily Advised him to decrease the [facility] powder Encourage more regular exercise

## 2019-07-31 NOTE — Assessment & Plan Note (Signed)
BP Readings from Last 3 Encounters:  07/31/19 140/72  07/24/18 128/62  05/01/18 124/66   BP well controlled Current regimen effective and well tolerated Continue current medications at current doses cmp

## 2019-07-31 NOTE — Assessment & Plan Note (Signed)
Check lipid panel, CMP, TSH Continue daily statin Regular exercise and healthy diet encouraged  

## 2019-07-31 NOTE — Assessment & Plan Note (Signed)
Check a1c Low sugar / carb diet Stressed regular exercise Encourage weight loss

## 2019-07-31 NOTE — Assessment & Plan Note (Signed)
Mild, intermittent Uses albuterol only as needed Okay to continue
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
## 2019-12-26 ENCOUNTER — Telehealth (INDEPENDENT_AMBULATORY_CARE_PROVIDER_SITE_OTHER): Payer: Medicare Other | Admitting: Adult Health

## 2019-12-26 ENCOUNTER — Encounter: Payer: Self-pay | Admitting: Adult Health

## 2019-12-26 VITALS — BP 136/74 | HR 54 | Temp 97.7°F | Ht 68.0 in | Wt 254.1 lb

## 2019-12-26 DIAGNOSIS — I10 Essential (primary) hypertension: Secondary | ICD-10-CM | POA: Diagnosis not present

## 2019-12-26 DIAGNOSIS — E7800 Pure hypercholesterolemia, unspecified: Secondary | ICD-10-CM

## 2019-12-26 NOTE — Patient Instructions (Signed)
Medication Instructions:  Continue current medications  *If you need a refill on your cardiac medications before your next appointment, please call your pharmacy*  Lab Work: None Ordered  Testing/Procedures: None Ordered  Follow-Up: At [facility], you and your health needs are our priority.  As part of our continuing mission to provide you with exceptional heart care, we have created designated Provider Care Teams.  These Care Teams include your primary Cardiologist (physician) and Advanced Practice Providers (APPs -  Physician Assistants and Nurse Practitioners) who all work together to provide you with the care you need, when you need it.  Your next appointment:   As Needed

## 2020-01-06 ENCOUNTER — Ambulatory Visit: Payer: Medicare Other | Attending: Internal Medicine

## 2020-01-06 DIAGNOSIS — Z23 Encounter for immunization: Secondary | ICD-10-CM | POA: Insufficient documentation

## 2020-01-06 NOTE — Progress Notes (Signed)
   Covid-19 Vaccination Clinic  Name:  [name]    MRN: [id number] DOB: [birth year]  01/06/2020  [name] was observed post Covid-19 immunization for 15 minutes without incidence. He was provided with Vaccine Information Sheet and instruction to access the V-Safe system.   [name] was instructed to call 911 with any severe reactions post vaccine: Marland Kitchen Difficulty breathing  . Swelling of your face and throat  . A fast heartbeat  . A bad rash all over your body  . Dizziness and weakness    Immunizations Administered    Name Date Dose VIS Date Route   Pfizer COVID-19 Vaccine 01/06/2020 10:41 AM 0.3 mL 10/26/2019 Intramuscular   Manufacturer: [place]   Lot: [id number]   [place]: [id number]

## 2020-01-21 ENCOUNTER — Other Ambulatory Visit: Payer: Self-pay | Admitting: Internal Medicine

## 2020-01-28 NOTE — Patient Instructions (Addendum)
  Blood work was ordered.   ° ° °Medications reviewed and updated.  Changes include :   none ° ° ° °Please followup in 6 months ° ° °
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
## 2020-01-29 ENCOUNTER — Encounter: Payer: Self-pay | Admitting: Internal Medicine

## 2020-01-29 ENCOUNTER — Other Ambulatory Visit: Payer: Self-pay

## 2020-01-29 ENCOUNTER — Ambulatory Visit (INDEPENDENT_AMBULATORY_CARE_PROVIDER_SITE_OTHER): Payer: Medicare Other | Admitting: Internal Medicine

## 2020-01-29 VITALS — BP 144/82 | HR 54 | Temp 97.9°F | Resp 16 | Ht 68.0 in | Wt 252.0 lb

## 2020-01-29 DIAGNOSIS — R7303 Prediabetes: Secondary | ICD-10-CM | POA: Diagnosis not present

## 2020-01-29 DIAGNOSIS — E669 Obesity, unspecified: Secondary | ICD-10-CM

## 2020-01-29 DIAGNOSIS — E785 Hyperlipidemia, unspecified: Secondary | ICD-10-CM | POA: Diagnosis not present

## 2020-01-29 DIAGNOSIS — J4520 Mild intermittent asthma, uncomplicated: Secondary | ICD-10-CM | POA: Diagnosis not present

## 2020-01-29 DIAGNOSIS — I10 Essential (primary) hypertension: Secondary | ICD-10-CM | POA: Diagnosis not present

## 2020-01-29 LAB — COMPREHENSIVE METABOLIC PANEL
ALT: 21 U/L (ref 0–53)
AST: 22 U/L (ref 0–37)
Albumin: 4.2 g/dL (ref 3.5–5.2)
Alkaline Phosphatase: 78 U/L (ref 39–117)
BUN: 15 mg/dL (ref 6–23)
CO2: 26 mEq/L (ref 19–32)
Calcium: 9.5 mg/dL (ref 8.4–10.5)
Chloride: 104 mEq/L (ref 96–112)
Creatinine, Ser: 1.27 mg/dL (ref 0.40–1.50)
GFR: 66.78 mL/min (ref >60.00)
Glucose, Bld: 107 mg/dL — ABNORMAL HIGH (ref 70–99)
Potassium: 4.3 mEq/L (ref 3.5–5.1)
Sodium: 139 mEq/L (ref 135–145)
Total Bilirubin: 0.6 mg/dL (ref 0.2–1.2)
Total Protein: 7.3 g/dL (ref 6.0–8.3)

## 2020-01-29 LAB — HEMOGLOBIN A1C: Hgb A1c MFr Bld: 6.3 % (ref 4.6–6.5)

## 2020-01-29 NOTE — Assessment & Plan Note (Signed)
Chronic Check lipid panel  Continue daily statin Regular exercise and healthy diet encouraged  

## 2020-01-29 NOTE — Assessment & Plan Note (Signed)
Chronic BP controlled Current regimen effective and well tolerated Continue current medications at current doses cmp   BP Readings from Last 3 Encounters:  01/29/20 (!) 144/82  12/26/19 136/74  07/31/19 140/72

## 2020-01-29 NOTE — Assessment & Plan Note (Signed)
Chronic With htn, prediabetes, hyperlipidemia Stressed regular exercise Healthy diet, decreased portions F/u in 6 months

## 2020-01-29 NOTE — Assessment & Plan Note (Signed)
Chronic Mild, intermittent Uses albuterol rarely Continue albuterol prn

## 2020-01-29 NOTE — Assessment & Plan Note (Addendum)
Chronic Check a1c Low sugar / carb diet Stressed regular exercise Advised weight loss 

## 2020-01-30 ENCOUNTER — Ambulatory Visit: Payer: Medicare Other | Attending: Internal Medicine

## 2020-01-30 DIAGNOSIS — Z23 Encounter for immunization: Secondary | ICD-10-CM

## 2020-01-30 NOTE — Progress Notes (Signed)
   Covid-19 Vaccination Clinic  Name:  [name]    MRN: [id number] DOB: [birth year]  01/30/2020  [name] was observed post Covid-19 immunization for 15 minutes without incident. He was provided with Vaccine Information Sheet and instruction to access the V-Safe system.   [name] was instructed to call 911 with any severe reactions post vaccine: Marland Kitchen Difficulty breathing  . Swelling of face and throat  . A fast heartbeat  . A bad rash all over body  . Dizziness and weakness   Immunizations Administered    Name Date Dose VIS Date Route   Pfizer COVID-19 Vaccine 01/30/2020  9:00 AM 0.3 mL 10/26/2019 Intramuscular   Manufacturer: [place]   Lot: [id number]   [place]: [id number]

## 2020-03-18 ENCOUNTER — Telehealth: Payer: Self-pay | Admitting: Internal Medicine

## 2020-03-18 DIAGNOSIS — E785 Hyperlipidemia, unspecified: Secondary | ICD-10-CM

## 2020-03-18 NOTE — Progress Notes (Signed)
°  Chronic Care Management   Note  03/18/2020 Name: [name] MRN: [id number] DOB: 1946-06-07  [name] is a 74 y.o. year old male who is a primary care patient of Burns, Claudina Lick, MD. I reached out to [name] by phone today in response to a referral sent by [name]'s PCP, [name], MD.   [name] was given information about Chronic Care Management services today including:  1. CCM service includes personalized support from designated clinical staff supervised by his physician, including individualized plan of care and coordination with other care providers 2. 24/7 contact phone numbers for assistance for urgent and routine care needs. 3. Service will only be billed when office clinical staff spend 20 minutes or more in a month to coordinate care. 4. Only one practitioner may furnish and bill the service in a calendar month. 5. The patient may stop CCM services at any time (effective at the end of the month) by phone call to the office staff.   Patient agreed to services and verbal consent obtained.    This note is not being shared with the patient for the following reason: To respect privacy (The patient or proxy has requested that the information not be shared).  Follow up plan:   [name] [employer] Scheduler

## 2020-04-29 DIAGNOSIS — E119 Type 2 diabetes mellitus without complications: Secondary | ICD-10-CM | POA: Diagnosis not present

## 2020-04-29 DIAGNOSIS — H524 Presbyopia: Secondary | ICD-10-CM | POA: Diagnosis not present

## 2020-04-29 LAB — HM DIABETES EYE EXAM

## 2020-04-29 NOTE — Addendum Note (Signed)
Addended by: Karle Barr on: 04/29/2020 08:58 AM   Modules accepted: Orders

## 2020-05-01 NOTE — Chronic Care Management (AMB) (Deleted)
Chronic Care Management Pharmacy  Name: [name]  MRN: [id number] DOB: 07/14/1946  Chief Complaint/ HPI  [name],  74 y.o. , male presents for their Initial CCM visit with the clinical pharmacist via telephone due to COVID-19 Pandemic.  PCP : [name], MD  Their chronic conditions include: HTN, Asthma, Osteoarthritis, dyslipidemia, prediabetes, BPH  Office Visits: 01/29/20: Patient presented to Dr. Quay Burow for HTN follow-up. BP in clinic 144/82. No medication changes made.   Consult Visit: 12/26/19: Patient presented to Leonia Reader, NP (Cardiology) for follow-up. Alfuzosin discontinued.  Medications: Outpatient Encounter Medications as of 05/02/2020  Medication Sig  . albuterol (VENTOLIN HFA) 108 (90 Base) MCG/ACT inhaler Inhale 2 puffs in the lungs every 4 hours as needed for cough, wheezing, or SOB.  Marland Kitchen bisoprolol-hydrochlorothiazide (ZIAC) 10-6.25 MG tablet TAKE 1 TABLET BY MOUTH DAILY  . Blood Glucose Monitoring Suppl (BLOOD GLUCOSE METER KIT AND SUPPLIES) Test blood sugar daily. Dx code: 250.00  . finasteride (PROSCAR) 5 MG tablet Take 5 mg by mouth daily.  Marland Kitchen FREESTYLE LITE test strip TEST DAILY  . Lancets MISC Test blood sugar daily. Dx code: 250.00  . lisinopril (ZESTRIL) 2.5 MG tablet TAKE 1 TABLET(2.5 MG) BY MOUTH DAILY  . loratadine (CLARITIN) 10 MG tablet Take 10 mg by mouth daily as needed.   . Misc Natural Products (OSTEO BI-FLEX TRIPLE STRENGTH) TABS Take by mouth.  . Multiple Vitamin (MULTIVITAMIN) capsule Take 1 capsule by mouth daily.  . nitroGLYCERIN (NITROSTAT) 0.4 MG SL tablet If you need to use this medication please inform your cardiologist and Dr. Everlene Farrier.  . silodosin (RAPAFLO) 8 MG CAPS capsule Take 8 mg by mouth at bedtime.  . simvastatin (ZOCOR) 40 MG tablet TAKE 1/2 BY MOUTH EVERY NIGHT AT BEDTIME   No facility-administered encounter medications on file as of 05/02/2020.     Current Diagnosis/Assessment:  Goals Addressed     None     Asthma    Last spirometry score: ***  Gold Grade: {CHL [facility] COPD Gold BTCYE:[id number]} Current COPD Classification:  {CHL HP [facility] COPD Classification:[phone]}  Eosinophil count:   Lab Results  Component Value Date/Time   EOSPCT 3.4 07/31/2019 09:54 AM  %                               Eos (Absolute):  Lab Results  Component Value Date/Time   EOSABS 0.1 07/31/2019 09:54 AM    Tobacco Status:  Social History   Tobacco Use  Smoking Status Former Smoker  . Packs/day: 0.25  . Years: 16.00  . Pack years: 4.00  . Types: Cigarettes  . Start date: 06/25/1957  . Quit date: 06/25/1973  . Years since quitting: 46.8  Smokeless Tobacco Never Used    Patient has failed these meds in past: *** Patient is currently {CHL Controlled/[phone]} on the following medications:   Ventolin HFA 108 mcg/act 2 puff q4hr PRN    Using maintenance inhaler regularly? {yes/no:20286} Frequency of rescue inhaler use:  {CHL [facility] Inhaler TKKO:[id number]}  We discussed:  {CHL [facility] discussion:[phone]}  Plan  Continue {CHL [facility] [phone]} ,  Pre-Diabetes   Recent Relevant Labs: Lab Results  Component Value Date/Time   HGBA1C 6.3 01/29/2020 09:16 AM   HGBA1C 6.3 07/31/2019 09:54 AM   MICROALBUR 0.3 11/25/2015 01:40 PM   MICROALBUR 0.3 09/13/2014 01:20 PM     Checking BG: {  CHL HP Blood Glucose Monitoring [phone]}  Recent FBG Readings: Recent pre-meal BG readings: *** Recent 2hr PP BG readings:  *** Recent HS BG readings: ***  Patient has failed these meds in past: *** Patient is currently {CHL Controlled/Uncontrolled:[phone]} on the following medications: none  Last diabetic Foot exam:  Lab Results  Component Value Date/Time   HMDIABEYEEXA No Retinopathy 04/19/2019 12:00 AM    Last diabetic Eye exam: No results found for: HMDIABFOOTEX   We discussed: {CHL HP  [facility] [phone]}  Plan  Continue {CHL [facility] Plans:[phone]} and  Hypertension   BP today is:  {CHL HP [facility] Pharmacist BP ranges:[phone]}  Office blood pressures are  BP Readings from Last 3 Encounters:  01/29/20 (!) 144/82  12/26/19 136/74  07/31/19 140/72    Patient has failed these meds in the past: *** Patient is currently {CHL Controlled/Uncontrolled:[phone]} on the following medications:   Lisinopril 2.5 mg daily   Bisoprolol-HCTZ 10-6.25 mg daily  Patient checks BP at home {CHL HP BP Monitoring Frequency:[phone]}  Patient home BP readings are ranging: ***  We discussed {CHL [facility] [phone]}  Plan  Continue {CHL [facility] Plans:[phone]}   Hyperlipidemia   LDL goal < ***  Lipid Panel     Component Value Date/Time   CHOL 100 07/31/2019 0954   TRIG 102.0 07/31/2019 0954   HDL 33.50 (L) 07/31/2019 0954   LDLCALC 46 07/31/2019 0954    Hepatic Function Latest Ref Rng & Units 01/29/2020 07/31/2019 07/24/2018  Total Protein 6.0 - 8.3 g/dL 7.3 7.3 6.8  Albumin 3.5 - 5.2 g/dL 4.2 4.5 4.3  AST 0 - 37 U/L 22 19 20   ALT 0 - 53 U/L 21 21 18   Alk Phosphatase 39 - 117 U/L 78 66 72  Total Bilirubin 0.2 - 1.2 mg/dL 0.6 0.5 0.6  Bilirubin, Direct 0.0 - 0.3 mg/dL - - -     The ASCVD Risk score Mikey Bussing DC Jr., et al., 2013) failed to calculate for the following reasons:   The valid total cholesterol range is 130 to 320 mg/dL   Patient has failed these meds in past: *** Patient is currently {CHL Controlled/Uncontrolled:[phone]} on the following medications:   Simvastatin 40 mg 1/2 tab daily   We discussed:  {CHL [facility] [phone]}  Plan  Continue {CHL [facility] Plans:[phone]}  BPH   PSA  Date Value Ref Range Status  07/22/2017 1.09 0.10 - 4.00 ng/ml Final    Comment:    Test performed using Access Hybritech PSA Assay, a parmagnetic  partical, chemiluminecent immunoassay.  11/25/2015 1.35 <=4.00 ng/mL Final    Comment:    Test Methodology: ECLIA PSA (Electrochemiluminescence Immunoassay)   For PSA values from 2.5-4.0, particularly in younger men <55 years old, the AUA and NCCN suggest testing for % Free PSA (3515) and evaluation of the rate of increase in PSA (PSA velocity).   09/13/2014 1.05 <=4.00 ng/mL Final    Comment:    Test Methodology: ECLIA PSA (Electrochemiluminescence Immunoassay)   For PSA values from 2.5-4.0, particularly in younger men <82 years old, the AUA and NCCN suggest testing for % Free PSA (3515) and evaluation of the rate of increase in PSA (PSA velocity).    Patient has failed these meds in past: *** Patient is currently {CHL Controlled/Uncontrolled:[phone]} on the following medications:   Finasteride 5 mg daily   Silodosin 8 mg QHS   We discussed:  ***  Plan  Continue {CHL HP  [facility] Plans:[phone]}  Misc/OTC   Loratadine 10 mg daily PRN  Osteo Bi-Flex daily  Multivitamin daily  Nitroglycerin 0.4 mg SL PRN   We discussed:  ***  Plan  Continue {CHL HP [facility] OOLDZ:[id number]}  Vaccines   Reviewed and discussed patient's vaccination history.    Immunization History  Administered Date(s) Administered  . Fluad Quad(high Dose 65+) 07/31/2019  . Influenza Split 07/20/2011, 10/03/2012  . Influenza, High Dose Seasonal PF 07/22/2016, 07/22/2017, 07/24/2018  . Influenza,inj,Quad PF,6+ Mos 09/11/2013, 09/13/2014, 07/29/2015  . PFIZER SARS-COV-2 Vaccination 01/06/2020, 01/30/2020  . Pneumococcal Conjugate-13 09/13/2014  . Pneumococcal Polysaccharide-23 11/15/1998, 09/07/2005, 01/19/2017  . Td 11/11/2003  . Tdap 03/25/2015    Plan  Recommended patient receive *** vaccine in *** office/pharmacy.   Medication Management   Pt uses [facility] for all medications. No >5 fill gap on external fill history.  Uses pill box? {Yes or If no, why  not?:20788} Pt endorses ***% compliance  We discussed: ***  Plan  {[facility] Pharmacy IGWV:49656}    Follow up: *** month phone visit  ***

## 2020-05-02 ENCOUNTER — Other Ambulatory Visit: Payer: Self-pay

## 2020-05-02 ENCOUNTER — Ambulatory Visit: Payer: Medicare Other | Admitting: Pharmacist

## 2020-05-02 DIAGNOSIS — E785 Hyperlipidemia, unspecified: Secondary | ICD-10-CM

## 2020-05-02 DIAGNOSIS — I10 Essential (primary) hypertension: Secondary | ICD-10-CM

## 2020-05-02 DIAGNOSIS — R7303 Prediabetes: Secondary | ICD-10-CM

## 2020-05-02 DIAGNOSIS — M170 Bilateral primary osteoarthritis of knee: Secondary | ICD-10-CM

## 2020-05-02 DIAGNOSIS — J45909 Unspecified asthma, uncomplicated: Secondary | ICD-10-CM

## 2020-05-02 MED ORDER — FREESTYLE FREEDOM LITE W/DEVICE KIT: 1.0000 | PACK | Freq: Every day | 0 refills | Status: DC

## 2020-05-02 MED ORDER — ALBUTEROL SULFATE HFA 108 (90 BASE) MCG/ACT IN AERS: INHALATION_SPRAY | RESPIRATORY_TRACT | 0 refills | Status: DC

## 2020-05-02 NOTE — Chronic Care Management (AMB) (Signed)
Chronic Care Management Pharmacy  Name: [name]  MRN: [id number] DOB: [birth year]  Chief Complaint/ HPI  [name],  74 y.o. , male presents for their Initial CCM visit with the clinical pharmacist via telephone due to COVID-19 Pandemic.  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   Office Visits: 01/29/20: Patient presented to Dr. Quay [name] for HTN follow-up. BP in clinic 144/82. No medication changes made.  Consult Visit: 12/26/19: Patient presented to Bruce Reader, NP (Cardiology) for follow-up. Alfuzosin discontinued.  09/07/19 Dr [name] (urology): via claims  No Known Allergies  Medications: Outpatient Encounter Medications as of 05/02/2020  Medication Sig  . albuterol (VENTOLIN HFA) 108 (90 Base) MCG/ACT inhaler Inhale 2 puffs in the lungs every 4 hours as needed for cough, wheezing, or SOB.  Marland Kitchen bisoprolol-hydrochlorothiazide (ZIAC) 10-6.25 MG tablet TAKE 1 TABLET BY MOUTH DAILY  . Blood Glucose Monitoring Suppl (BLOOD GLUCOSE METER KIT AND SUPPLIES) Test blood sugar daily. Dx code: 74.00 (Patient taking differently: Freestyle Lite. Test blood sugar daily. Dx code: 250.00)  . finasteride (PROSCAR) 5 MG tablet Take 5 mg by mouth daily.  Marland Kitchen FREESTYLE LITE test strip TEST DAILY  . Lancets MISC Test blood sugar daily. Dx code: 250.00  . lisinopril (ZESTRIL) 2.5 MG tablet TAKE 1 TABLET(2.5 MG) BY MOUTH DAILY  . loratadine (CLARITIN) 10 MG tablet Take 10 mg by mouth daily as needed.   . Misc Natural Products (OSTEO BI-FLEX TRIPLE STRENGTH) TABS Take by mouth.  . Multiple Vitamin (MULTIVITAMIN) capsule Take 1 capsule by mouth daily.  . simvastatin (ZOCOR) 40 MG tablet TAKE 1/2 BY MOUTH EVERY NIGHT AT BEDTIME  . nitroGLYCERIN (NITROSTAT) 0.4 MG SL tablet If you need to use this  medication please inform your cardiologist and Dr. Everlene [name].  . silodosin (RAPAFLO) 8 MG CAPS capsule Take 8 mg by mouth at bedtime. (Patient not taking: Reported on 05/02/2020)   No facility-administered encounter medications on file as of 05/02/2020.     Current Diagnosis/Assessment: SDOH Interventions     Most Recent Value  SDOH Interventions  Financial Strain Interventions Intervention Not Indicated      Goals Addressed            This Visit's Progress   . Pharmacy Care Plan       CARE PLAN ENTRY (see longitudinal plan of care for additional care plan information)  Current Barriers:  . Chronic Disease Management support, education, and care coordination needs related to Hypertension, Hyperlipidemia, Prediabetes, and Osteoarthritis   Hypertension BP Readings from Last 3 Encounters:  01/29/20 (!) 144/82  12/26/19 136/74  07/31/19 140/72 .  Pharmacist Clinical Goal(s): o Over the next 120 days, patient will work with PharmD and providers to achieve BP goal <130/80 . Current regimen:  o Lisinopril 2.5 mg daily  o Bisoprolol-HCTZ 10-6.25 mg daily  . Interventions: o Discussed BP goal and benefits of medications . Patient self care activities - Over the next 120 days, patient will: o Check BP 1-2 times weekly, document, and provide at future appointments o Ensure daily salt intake < 2300 mg/day  Hyperlipidemia Lab Results  Component Value Date/Time   LDLCALC 46 07/31/2019 09:54 AM .  Pharmacist Clinical Goal(s): o Over the next 120 days, patient will work with PharmD and providers to maintain LDL goal < 70 . Current regimen:  o Simvastatin  40 mg - 1/2 tablet daily . Interventions: o Discussed cholesterol goals and benefits of simvastatin for lowering bad cholesterol and preventing heart attack / stroke . Patient self care activities - Over the next 120 days, patient will: o Continue medication as prescribed  Prediabetes Lab Results  Component Value Date/Time    HGBA1C 6.3 01/29/2020 09:16 AM   HGBA1C 6.3 07/31/2019 09:54 AM .  Pharmacist Clinical Goal(s): o Over the next 120 days, patient will work with PharmD and providers to maintain A1c goal <6.5% . Current regimen:  o No medications . Interventions: o Discussed A1c as measure of blood sugar over previous 3 months o An A1c of 6.5 or higher is considered diabetes  o Discussed limiting carbohydrates and sugars in diet . Patient self care activities - Over the next 120 days, patient will: o Check blood sugar 1-2 times weekly, document, and provide at future appointments o Limit carbohydrates in diet ("white foods" - pasta, rice, potatoes, bread)  Osteoarthritis . Pharmacist Clinical Goal(s) o Over the next 120 days, patient will work with PharmD and providers to optimize therapy . Current regimen:  o BC powder . Interventions: o Discussed safety issues with BC powder including stomach bleeding o Recommended Tylenol as safer option to treat pain . Patient self care activities - Over the next 120 days, patient will: o Limit BC powder and use Tylenol Extra Strength 500 mg 1-2 tablets up to every 6 hours  Medication management . Pharmacist Clinical Goal(s): o Over the next 120 days, patient will work with PharmD and providers to achieve optimal medication adherence . Current pharmacy: [facility] . Interventions o Comprehensive medication review performed. o Utilize [facility] for medication synchronization, packaging and delivery . Patient self care activities - Over the next 120 days, patient will: o Focus on medication adherence by fill date o Take medications as prescribed o Report any questions or concerns to PharmD and/or provider(s)  Initial goal documentation       Mild Asthma    Tobacco Status:  Social History   Tobacco Use  Smoking Status Former Smoker  . Packs/day: 0.25  . Years: 16.00  . Pack years: 4.00  . Types: Cigarettes  . Start date: 06/25/1957  . Quit  date: 06/25/1973  . Years since quitting: 46.8  Smokeless Tobacco Never Used   Patient has failed these meds in past: n/a Patient is currently uncontrolled on the following medications:   Ventolin HFA 108 mcg/act 2 puff q4hr PRN    Using maintenance inhaler regularly? No Frequency of rescue inhaler use:  several times per month  We discussed:  Pt uses inhaler more often during allergy season; he requests a new rx for albuterol inhaler  Plan  Continue current medications  Pre-Diabetes   Recent Relevant Labs: Lab Results  Component Value Date/Time   HGBA1C 6.3 01/29/2020 09:16 AM   HGBA1C 6.3 07/31/2019 09:54 AM   MICROALBUR 0.3 11/25/2015 01:40 PM   MICROALBUR 0.3 09/13/2014 01:20 PM    No medications indicated.  Checking BG: several times per week Fasting BG: 90-102  We discussed: A1c definition; A1c threshold for DM diagnosis; importance of diet/exercise changes to prevent diabetes; foods high in carbohydrates and sugar  Plan  Continue control with diet and exercise   Hypertension   BP goal is:  <130/80  Office blood pressures are  BP Readings from Last 3 Encounters:  01/29/20 (!) 144/82  12/26/19 136/74  07/31/19 140/72   Kidney Function Lab Results  Component  Value Date/Time   CREATININE 1.27 01/29/2020 09:16 AM   CREATININE 1.25 07/31/2019 09:54 AM   CREATININE 1.19 (H) 11/25/2015 01:40 PM   CREATININE 1.15 03/25/2015 10:55 AM   GFR 66.78 01/29/2020 09:16 AM   GFRNONAA 61 11/25/2015 01:40 PM   GFRAA 71 11/25/2015 01:40 PM   K 4.3 01/29/2020 09:16 AM   K 4.2 07/31/2019 09:54 AM   Patient checks BP at home several times per month  Patient home BP readings are ranging: 120/75 - 142/80  Patient has failed these meds in the past: n/a Patient is currently controlled on the following medications:   Lisinopril 2.5 mg daily   Bisoprolol-HCTZ 10-6.25 mg daily   We discussed BP goals; pt reports rare occurrences of elevated BP at home (to 140s/80s),  mostly stays in 120/70s range.   Plan  Continue current medications   Hyperlipidemia   LDL goal < 70  Lipid Panel     Component Value Date/Time   CHOL 100 07/31/2019 0954   TRIG 102.0 07/31/2019 0954   HDL 33.50 (L) 07/31/2019 0954   LDLCALC 46 07/31/2019 0954    Hepatic Function Latest Ref Rng & Units 01/29/2020 07/31/2019 07/24/2018  Total Protein 6.0 - 8.3 g/dL 7.3 7.3 6.8  Albumin 3.5 - 5.2 g/dL 4.2 4.5 4.3  AST 0 - 37 U/L 22 19 20   ALT 0 - 53 U/L 21 21 18   Alk Phosphatase 39 - 117 U/L 78 66 72  Total Bilirubin 0.2 - 1.2 mg/dL 0.6 0.5 0.6  Bilirubin, Direct 0.0 - 0.3 mg/dL - - -     The ASCVD Risk score ([place]., et al., 2013) failed to calculate for the following reasons:   The valid total cholesterol range is 130 to 320 mg/dL   Patient has failed these meds in past: n/a Patient is currently controlled on the following medications:   Simvastatin 40 mg 1/2 tab daily    We discussed:  Cholesterol goals; HDL vs LDL; benefits of statin for ASCVD risk reduction  Plan  Continue current medications and control with diet and exercise  BPH / OAB   Lab Results  Component Value Date/Time   PSA 1.09 07/22/2017 10:32 AM   PSA 1.35 11/25/2015 01:40 PM   PSA 1.05 09/13/2014 01:20 PM   Patient has failed these meds in past: silodosin (cost) Patient is currently controlled on the following medications:   Finasteride 5 mg daily   We discussed:  Pt has not been taking finasteride consistently over last several months; discussed benefits of finasteride for prostate health and urinary issues; pt is not taking silodosin due to high cost. He denies issues with urination currently but agrees to restart consistent use of finasteride to prevent prostate growth  Plan  Continue current medications  Osteoarthritis   Patient has failed these meds in past: n/a Patient is currently controlled on the following medications:  . BC powder (aspirin 845 mg)  We discussed:  Safety  risks with daily use of BC powder (high dose aspirin); discussed Tylenol as safer alternative  Plan  Recommend to stop BC powder and start Tylenol up to 3000 mg/day  Misc/OTC   Patient is currently controlled on the following medications:   Loratadine 10 mg daily PRN   Osteo Bi-Flex daily   Multivitamin daily   Flonase  We discussed:  Patient is satisfied with current OTC regimen and denies issues  Plan  Continue current medications   Medication Management   Pt uses [facility]  pharmacy for all medications. No >5 fill gap on external fill history.  Uses pill box? No - prefers to keep in bottles Pt endorses 99% compliance  We discussed: Verbal consent obtained for [facility] enhanced pharmacy services (medication synchronization, adherence packaging, delivery coordination). A medication sync plan was created to allow patient to get all medications delivered once every 30 to 90 days per patient preference. Patient understands they have freedom to choose pharmacy and clinical pharmacist will coordinate care between all prescribers and [facility].   Plan  Utilize [facility] for medication synchronization, packaging and delivery    Follow up: 4 month phone visit  Charlene Brooke, PharmD Clinical Pharmacist [facility] [phone]

## 2020-05-02 NOTE — Patient Instructions (Addendum)
Visit Information  Phone number for Pharmacist: [phone]  Thank you for meeting with me to discuss your medications! I look forward to working with you to achieve your health care goals. Below is a summary of what we talked about during the visit:  Goals Addressed            This Visit's Progress   . Pharmacy Care Plan       CARE PLAN ENTRY (see longitudinal plan of care for additional care plan information)  Current Barriers:  . Chronic Disease Management support, education, and care coordination needs related to Hypertension, Hyperlipidemia, Prediabetes, and Osteoarthritis   Hypertension BP Readings from Last 3 Encounters:  01/29/20 (!) 144/82  12/26/19 136/74  07/31/19 140/72 .  Pharmacist Clinical Goal(s): o Over the next 120 days, patient will work with PharmD and providers to achieve BP goal <130/80 . Current regimen:  o Lisinopril 2.5 mg daily  o Bisoprolol-HCTZ 10-6.25 mg daily  . Interventions: o Discussed BP goal and benefits of medications . Patient self care activities - Over the next 120 days, patient will: o Check BP 1-2 times weekly, document, and provide at future appointments o Ensure daily salt intake < 2300 mg/day  Hyperlipidemia Lab Results  Component Value Date/Time   LDLCALC 46 07/31/2019 09:54 AM .  Pharmacist Clinical Goal(s): o Over the next 120 days, patient will work with PharmD and providers to maintain LDL goal < 70 . Current regimen:  o Simvastatin 40 mg - 1/2 tablet daily . Interventions: o Discussed cholesterol goals and benefits of simvastatin for lowering bad cholesterol and preventing heart attack / stroke . Patient self care activities - Over the next 120 days, patient will: o Continue medication as prescribed  Prediabetes Lab Results  Component Value Date/Time   HGBA1C 6.3 01/29/2020 09:16 AM   HGBA1C 6.3 07/31/2019 09:54 AM .  Pharmacist Clinical Goal(s): o Over the next 120 days, patient will work with PharmD and  providers to maintain A1c goal <6.5% . Current regimen:  o No medications . Interventions: o Discussed A1c as measure of blood sugar over previous 3 months o An A1c of 6.5 or higher is considered diabetes  o Discussed limiting carbohydrates and sugars in diet . Patient self care activities - Over the next 120 days, patient will: o Check blood sugar 1-2 times weekly, document, and provide at future appointments o Limit carbohydrates in diet ("white foods" - pasta, rice, potatoes, bread)  Osteoarthritis . Pharmacist Clinical Goal(s) o Over the next 120 days, patient will work with PharmD and providers to optimize therapy . Current regimen:  o BC powder . Interventions: o Discussed safety issues with BC powder including stomach bleeding o Recommended Tylenol as safer option to treat pain . Patient self care activities - Over the next 120 days, patient will: o Limit BC powder and use Tylenol Extra Strength 500 mg 1-2 tablets up to every 6 hours  Medication management . Pharmacist Clinical Goal(s): o Over the next 120 days, patient will work with PharmD and providers to achieve optimal medication adherence . Current pharmacy: [facility] . Interventions o Comprehensive medication review performed. o Utilize [facility] pharmacy for medication synchronization, packaging and delivery . Patient self care activities - Over the next 120 days, patient will: o Focus on medication adherence by fill date o Take medications as prescribed o Report any questions or concerns to PharmD and/or provider(s)  Initial goal documentation       Mr. Bolls was given information about  Chronic Care Management services today including:  1. CCM service includes personalized support from designated clinical staff supervised by his physician, including individualized plan of care and coordination with other care providers 2. 24/7 contact phone numbers for assistance for urgent and routine care  needs. 3. Standard insurance, coinsurance, copays and deductibles apply for chronic care management only during months in which we provide at least 20 minutes of these services. Most insurances cover these services at 100%, however patients may be responsible for any copay, coinsurance and/or deductible if applicable. This service may help you avoid the need for more expensive face-to-face services. 4. Only one practitioner may furnish and bill the service in a calendar month. 5. The patient may stop CCM services at any time (effective at the end of the month) by phone call to the office staff.  Patient agreed to services and verbal consent obtained.   The patient verbalized understanding of instructions provided today and agreed to receive a mailed copy of patient instruction and/or educational materials. Telephone follow up appointment with pharmacy team member scheduled for: 4 months  Charlene Brooke, PharmD Clinical Pharmacist [facility] [phone]   Carbohydrate Counting for Diabetes Mellitus, Adult  Carbohydrate counting is a method of keeping track of how many carbohydrates you eat. Eating carbohydrates naturally increases the amount of sugar (glucose) in the blood. Counting how many carbohydrates you eat helps keep your blood glucose within normal limits, which helps you manage your diabetes (diabetes mellitus). It is important to know how many carbohydrates you can safely have in each meal. This is different for every person. A diet and nutrition specialist (registered dietitian) can help you make a meal plan and calculate how many carbohydrates you should have at each meal and snack. Carbohydrates are found in the following foods:  Grains, such as breads and cereals.  Dried beans and soy products.  Starchy vegetables, such as potatoes, peas, and corn.  Fruit and fruit juices.  Milk and yogurt.  Sweets and snack foods, such as cake, cookies,  candy, chips, and soft drinks. How do I count carbohydrates? There are two ways to count carbohydrates in food. You can use either of the methods or a combination of both. Reading "Nutrition Facts" on packaged food The "Nutrition Facts" list is included on the labels of almost all packaged foods and beverages in the [place] It includes:  The serving size.  Information about nutrients in each serving, including the grams (g) of carbohydrate per serving. To use the "Nutrition Facts":  Decide how many servings you will have.  Multiply the number of servings by the number of carbohydrates per serving.  The resulting number is the total amount of carbohydrates that you will be having. Learning standard serving sizes of other foods When you eat carbohydrate foods that are not packaged or do not include "Nutrition Facts" on the label, you need to measure the servings in order to count the amount of carbohydrates:  Measure the foods that you will eat with a food scale or measuring cup, if needed.  Decide how many standard-size servings you will eat.  Multiply the number of servings by 15. Most carbohydrate-rich foods have about 15 g of carbohydrates per serving. ? For example, if you eat 8 oz (170 g) of strawberries, you will have eaten 2 servings and 30 g of carbohydrates (2 servings x 15 g = 30 g).  For foods that have more than one food mixed, such as soups and  casseroles, you must count the carbohydrates in each food that is included. The following list contains standard serving sizes of common carbohydrate-rich foods. Each of these servings has about 15 g of carbohydrates:   hamburger bun or  English muffin.   oz (15 mL) syrup.   oz (14 g) jelly.  1 slice of bread.  1 six-inch tortilla.  3 oz (85 g) cooked rice or pasta.  4 oz (113 g) cooked dried beans.  4 oz (113 g) starchy vegetable, such as peas, corn, or potatoes.  4 oz (113 g) hot cereal.  4 oz (113 g) mashed  potatoes or  of a large baked potato.  4 oz (113 g) canned or frozen fruit.  4 oz (120 mL) fruit juice.  4-6 crackers.  6 chicken nuggets.  6 oz (170 g) unsweetened dry cereal.  6 oz (170 g) plain fat-free yogurt or yogurt sweetened with artificial sweeteners.  8 oz (240 mL) milk.  8 oz (170 g) fresh fruit or one small piece of fruit.  24 oz (680 g) popped popcorn. Example of carbohydrate counting Sample meal  3 oz (85 g) chicken breast.  6 oz (170 g) brown rice.  4 oz (113 g) corn.  8 oz (240 mL) milk.  8 oz (170 g) strawberries with sugar-free whipped topping. Carbohydrate calculation 1. Identify the foods that contain carbohydrates: ? Rice. ? Corn. ? Milk. ? Strawberries. 2. Calculate how many servings you have of each food: ? 2 servings rice. ? 1 serving corn. ? 1 serving milk. ? 1 serving strawberries. 3. Multiply each number of servings by 15 g: ? 2 servings rice x 15 g = 30 g. ? 1 serving corn x 15 g = 15 g. ? 1 serving milk x 15 g = 15 g. ? 1 serving strawberries x 15 g = 15 g. 4. Add together all of the amounts to find the total grams of carbohydrates eaten: ? 30 g + 15 g + 15 g + 15 g = 75 g of carbohydrates total. Summary  Carbohydrate counting is a method of keeping track of how many carbohydrates you eat.  Eating carbohydrates naturally increases the amount of sugar (glucose) in the blood.  Counting how many carbohydrates you eat helps keep your blood glucose within normal limits, which helps you manage your diabetes.  A diet and nutrition specialist (registered dietitian) can help you make a meal plan and calculate how many carbohydrates you should have at each meal and snack. This information is not intended to replace advice given to you by your health care provider. Make sure you discuss any questions you have with your health care provider. Document Revised: 05/26/2017 Document Reviewed: 04/14/2016 Elsevier Patient Education  [place].

## 2020-05-06 ENCOUNTER — Encounter: Payer: Self-pay | Admitting: Internal Medicine

## 2020-05-06 NOTE — Progress Notes (Signed)
Outside notes received. Information abstracted. Notes sent to scan.  

## 2020-07-10 ENCOUNTER — Other Ambulatory Visit: Payer: Self-pay

## 2020-07-10 MED ORDER — SIMVASTATIN 40 MG PO TABS: ORAL_TABLET | ORAL | 1 refills | Status: DC

## 2020-07-10 MED ORDER — LISINOPRIL 2.5 MG PO TABS: ORAL_TABLET | ORAL | 1 refills | Status: DC

## 2020-07-10 MED ORDER — BISOPROLOL-HYDROCHLOROTHIAZIDE 10-6.25 MG PO TABS: 1.0000 | ORAL_TABLET | Freq: Every day | ORAL | 1 refills | Status: DC

## 2020-07-14 ENCOUNTER — Telehealth: Payer: Self-pay | Admitting: Pharmacist

## 2020-07-14 NOTE — Progress Notes (Signed)
° ° °  Chronic Care Management Pharmacy Assistant   Name: [name]  MRN: [id number] DOB: 1946-11-14  Reason for Encounter: Medication Review  Patient Questions:  1.  Have you seen any other providers since your last visit? No  2.  Any changes in your medicines or health? No  PCP : [name], MD  Allergies:  No Known Allergies  Medications: Outpatient Encounter Medications as of 07/14/2020  Medication Sig   albuterol (VENTOLIN HFA) 108 (90 Base) MCG/ACT inhaler Inhale 2 puffs in the lungs every 4 hours as needed for cough, wheezing, or SOB.   bisoprolol-hydrochlorothiazide (ZIAC) 10-6.25 MG tablet Take 1 tablet by mouth daily.   Blood Glucose Monitoring Suppl (BLOOD GLUCOSE METER KIT AND SUPPLIES) Test blood sugar daily. Dx code: 78.00 (Patient taking differently: Freestyle Lite. Test blood sugar daily. Dx code: 250.00)   Blood Glucose Monitoring Suppl (FREESTYLE FREEDOM LITE) w/Device KIT 1 each by Does not apply route daily at 2 am.   finasteride (PROSCAR) 5 MG tablet Take 5 mg by mouth daily.   FREESTYLE LITE test strip TEST DAILY   Lancets MISC Test blood sugar daily. Dx code: 250.00   lisinopril (ZESTRIL) 2.5 MG tablet TAKE 1 TABLET(2.5 MG) BY MOUTH DAILY   loratadine (CLARITIN) 10 MG tablet Take 10 mg by mouth daily as needed.    Misc Natural Products (OSTEO BI-FLEX TRIPLE STRENGTH) TABS Take by mouth.   Multiple Vitamin (MULTIVITAMIN) capsule Take 1 capsule by mouth daily.   nitroGLYCERIN (NITROSTAT) 0.4 MG SL tablet If you need to use this medication please inform your cardiologist and Dr. Everlene Farrier.   silodosin (RAPAFLO) 8 MG CAPS capsule Take 8 mg by mouth at bedtime. (Patient not taking: Reported on 05/02/2020)   simvastatin (ZOCOR) 40 MG tablet TAKE 1/2 BY MOUTH EVERY NIGHT AT BEDTIME   No facility-administered encounter medications on file as of 07/14/2020.    Current Diagnosis: Patient Active Problem List   Diagnosis Date Noted   Erectile  dysfunction 07/22/2017   Neck pain 01/19/2017   Asthma 07/22/2016   Prediabetes 07/22/2016   Abnormal EKG 11/21/2014   Dyslipidemia 78/29/5621   Metabolic syndrome 30/86/5784   Obesity (BMI 30-39.9)    Essential hypertension 10/27/2011   Osteoarthritis 10/27/2011   Atherogenic dyslipidemia; low HDL, in setting of metabolic syndrome 69/62/9528   Renal cysts, acquired, bilateral 10/27/2011    Goals Addressed   None     Follow-Up:  Coordination of Enhanced Pharmacy Services    Reviewed chart for medication changes ahead of medication coordination call.  No OVs, Consults, or hospital visits since last care coordination call/Pharmacist visit.  No medication changes indicated.   BP Readings from Last 3 Encounters:  01/29/20 (!) 144/82  12/26/19 136/74  07/31/19 140/72    Lab Results  Component Value Date   HGBA1C 6.3 01/29/2020     Patient obtains medications through Vials  30 Days   Patient is due for next adherence delivery on: 07/18/2020. Called patient and reviewed medications and coordinated delivery.  This delivery to include: Bisoprolol 10-Hydrochlorothiazide 6.86m  Lisinopril 2.571m Simvastatin 4054mConfirmed delivery date of 07/18/2020, advised patient that pharmacy will contact them the morning of delivery.    DevRosendo GrosMAEdmondarmacist Assistant  [phone]

## 2020-07-19 ENCOUNTER — Other Ambulatory Visit: Payer: Self-pay | Admitting: Internal Medicine

## 2020-07-22 ENCOUNTER — Telehealth: Payer: Self-pay | Admitting: Pharmacist

## 2020-07-22 NOTE — Progress Notes (Signed)
    Chronic Care Management Pharmacy Assistant   Name: [name]  MRN: [id number] DOB: 23-Feb-1946  Reason for Encounter: Patient Call   PCP : [name], MD  Allergies:  No Known Allergies  Medications: Outpatient Encounter Medications as of 07/22/2020  Medication Sig  . albuterol (VENTOLIN HFA) 108 (90 Base) MCG/ACT inhaler Inhale 2 puffs in the lungs every 4 hours as needed for cough, wheezing, or SOB.  Marland Kitchen bisoprolol-hydrochlorothiazide (ZIAC) 10-6.25 MG tablet Take 1 tablet by mouth daily. Annual appt is due must see provider for future refills  . Blood Glucose Monitoring Suppl (BLOOD GLUCOSE METER KIT AND SUPPLIES) Test blood sugar daily. Dx code: 80.00 (Patient taking differently: Freestyle Lite. Test blood sugar daily. Dx code: 250.00)  . Blood Glucose Monitoring Suppl (FREESTYLE FREEDOM LITE) w/Device KIT 1 each by Does not apply route daily at 2 am.  . finasteride (PROSCAR) 5 MG tablet Take 5 mg by mouth daily. (Patient not taking: Reported on 07/15/2020)  . FREESTYLE LITE test strip TEST DAILY  . Lancets MISC Test blood sugar daily. Dx code: 250.00  . lisinopril (ZESTRIL) 2.5 MG tablet TAKE 1 TABLET(2.5 MG) BY MOUTH DAILY. Annual appt is due must see provider for future refills  . loratadine (CLARITIN) 10 MG tablet Take 10 mg by mouth daily as needed.   . Misc Natural Products (OSTEO BI-FLEX TRIPLE STRENGTH) TABS Take by mouth.  . Multiple Vitamin (MULTIVITAMIN) capsule Take 1 capsule by mouth daily.  . nitroGLYCERIN (NITROSTAT) 0.4 MG SL tablet If you need to use this medication please inform your cardiologist and Dr. Everlene Farrier.  . silodosin (RAPAFLO) 8 MG CAPS capsule Take 8 mg by mouth at bedtime.  (Patient not taking: Reported on 07/15/2020)  . simvastatin (ZOCOR) 40 MG tablet TAKE 1/2 BY MOUTH EVERY NIGHT AT BEDTIME   No facility-administered encounter medications on file as of 07/22/2020.    Current Diagnosis: Patient Active Problem List   Diagnosis Date Noted  .  Erectile dysfunction 07/22/2017  . Neck pain 01/19/2017  . Asthma 07/22/2016  . Prediabetes 07/22/2016  . Abnormal EKG 11/21/2014  . Dyslipidemia 11/21/2014  . Metabolic syndrome 88/41/6606  . Obesity (BMI 30-39.9)   . Essential hypertension 10/27/2011  . Osteoarthritis 10/27/2011  . Atherogenic dyslipidemia; low HDL, in setting of metabolic syndrome 30/16/0109  . Renal cysts, acquired, bilateral 10/27/2011    Goals Addressed   None     Follow-Up:  Pharmacist Review     Per clinical pharmacist I was instructed to call the patient to verifiy that he did get his delivery on Friday 07/18/2020 wanted to see if the patient was interested in a lock box for his medication delivery. After speaking with the patient he stated that he did get his medications and he stated that he does not want a lock box right now.   Rosendo Gros, [place] Pharmacist Assistant  [phone]

## 2020-07-24 ENCOUNTER — Other Ambulatory Visit: Payer: Self-pay | Admitting: Internal Medicine

## 2020-07-26 ENCOUNTER — Other Ambulatory Visit: Payer: Self-pay | Admitting: Internal Medicine
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
## 2020-07-30 NOTE — Patient Instructions (Addendum)
°  Blood work was ordered.   ° ° °Flu immunization administered today.   ° ° °Medications reviewed and updated.  Changes include :   none ° °Your prescription(s) have been submitted to your pharmacy. Please take as directed and contact our office if you believe you are having problem(s) with the medication(s). ° ° °Please followup in 6 months ° ° °

## 2020-07-31 ENCOUNTER — Ambulatory Visit (INDEPENDENT_AMBULATORY_CARE_PROVIDER_SITE_OTHER): Payer: Medicare Other | Admitting: Internal Medicine

## 2020-07-31 ENCOUNTER — Other Ambulatory Visit: Payer: Self-pay

## 2020-07-31 ENCOUNTER — Encounter: Payer: Self-pay | Admitting: Internal Medicine

## 2020-07-31 VITALS — BP 140/82 | HR 50 | Temp 98.2°F | Wt 242.2 lb

## 2020-07-31 DIAGNOSIS — Z23 Encounter for immunization: Secondary | ICD-10-CM | POA: Diagnosis not present

## 2020-07-31 DIAGNOSIS — R7303 Prediabetes: Secondary | ICD-10-CM

## 2020-07-31 DIAGNOSIS — I10 Essential (primary) hypertension: Secondary | ICD-10-CM | POA: Diagnosis not present

## 2020-07-31 DIAGNOSIS — E785 Hyperlipidemia, unspecified: Secondary | ICD-10-CM

## 2020-07-31 DIAGNOSIS — E669 Obesity, unspecified: Secondary | ICD-10-CM

## 2020-07-31 DIAGNOSIS — M170 Bilateral primary osteoarthritis of knee: Secondary | ICD-10-CM

## 2020-07-31 DIAGNOSIS — J45909 Unspecified asthma, uncomplicated: Secondary | ICD-10-CM

## 2020-07-31 DIAGNOSIS — J4520 Mild intermittent asthma, uncomplicated: Secondary | ICD-10-CM

## 2020-07-31 MED ORDER — ALBUTEROL SULFATE HFA 108 (90 BASE) MCG/ACT IN AERS: INHALATION_SPRAY | RESPIRATORY_TRACT | 11 refills | Status: DC

## 2020-07-31 NOTE — Assessment & Plan Note (Signed)
Chronic Taking BC powder - advised to not take it too much due to risk of stomach bleeding and elevated BP Stressed weight loss and regular exercise

## 2020-07-31 NOTE — Assessment & Plan Note (Signed)
BP Readings from Last 3 Encounters:  07/31/20 140/82  01/29/20 (!) 144/82  12/26/19 136/74   BP reasonable controlled Continue current medications cmp

## 2020-07-31 NOTE — Assessment & Plan Note (Signed)
Chronic  Has lost 10 lbs with cutting back on food Stressed regular exercise Continue weight loss efforts Advised goal of 200 lbs

## 2020-07-31 NOTE — Assessment & Plan Note (Signed)
Chronic Check lipid panel  Continue daily statin Regular exercise and healthy diet encouraged  

## 2020-07-31 NOTE — Assessment & Plan Note (Signed)
Chronic Check a1c Low sugar / carb diet Stressed regular exercise Continue weight loss

## 2020-07-31 NOTE — Assessment & Plan Note (Signed)
Chronic Mild, intermittent Uses albuterol prn only Continue prn

## 2020-08-01 LAB — LIPID PANEL
Cholesterol: 109 mg/dL (ref <200)
HDL: 31 mg/dL — ABNORMAL LOW (ref >=40)
LDL Cholesterol (Calc): 60 mg/dL (calc)
Non-HDL Cholesterol (Calc): 78 mg/dL (calc) (ref <130)
Total CHOL/HDL Ratio: 3.5 (calc) (ref <5.0)
Triglycerides: 96 mg/dL (ref <150)

## 2020-08-01 LAB — COMPLETE METABOLIC PANEL WITH GFR
AG Ratio: 1.8 (calc) (ref 1.0–2.5)
ALT: 16 U/L (ref 9–46)
AST: 19 U/L (ref 10–35)
Albumin: 4.2 g/dL (ref 3.6–5.1)
Alkaline phosphatase (APISO): 72 U/L (ref 35–144)
BUN/Creatinine Ratio: 12 (calc) (ref 6–22)
BUN: 15 mg/dL (ref 7–25)
CO2: 23 mmol/L (ref 20–32)
Calcium: 9 mg/dL (ref 8.6–10.3)
Chloride: 109 mmol/L (ref 98–110)
Creat: 1.28 mg/dL — ABNORMAL HIGH (ref 0.70–1.18)
GFR, Est African American: 63 mL/min/{1.73_m2} (ref >=60)
GFR, Est Non African American: 54 mL/min/{1.73_m2} — ABNORMAL LOW (ref >=60)
Globulin: 2.3 g/dL (calc) (ref 1.9–3.7)
Glucose, Bld: 103 mg/dL — ABNORMAL HIGH (ref 65–99)
Potassium: 4.4 mmol/L (ref 3.5–5.3)
Sodium: 141 mmol/L (ref 135–146)
Total Bilirubin: 0.6 mg/dL (ref 0.2–1.2)
Total Protein: 6.5 g/dL (ref 6.1–8.1)

## 2020-08-01 LAB — HEMOGLOBIN A1C
Hgb A1c MFr Bld: 6 % of total Hgb — ABNORMAL HIGH (ref <5.7)
Mean Plasma Glucose: 126 (calc)
eAG (mmol/L): 7 (calc)

## 2020-08-06 ENCOUNTER — Telehealth: Payer: Self-pay | Admitting: Pharmacist

## 2020-08-06 NOTE — Progress Notes (Signed)
Chronic Care Management Pharmacy Assistant   Name: [name]  MRN: [id number] DOB: 03/18/1946  Reason for Encounter: Medication Review  Patient Questions:  1.  Have you seen any other providers since your last visit? Yes, 07/31/2020 patient saw Dr. Quay Burow  2.  Any changes in your medicines or health? No   PCP : [name], MD  Allergies:  No Known Allergies  Medications: Outpatient Encounter Medications as of 08/06/2020  Medication Sig  . albuterol (VENTOLIN HFA) 108 (90 Base) MCG/ACT inhaler Inhale 2 puffs in the lungs every 4 hours as needed for cough, wheezing, or SOB.  Marland Kitchen bisoprolol-hydrochlorothiazide (ZIAC) 10-6.25 MG tablet TAKE 1 TABLET BY MOUTH DAILY. ANNUAL APPT. IS DUE MUST SEE PROVIDER FOR FUTURE REFILLS  . Blood Glucose Monitoring Suppl (BLOOD GLUCOSE METER KIT AND SUPPLIES) Test blood sugar daily. Dx code: 48.00 (Patient taking differently: Freestyle Lite. Test blood sugar daily. Dx code: 250.00)  . Blood Glucose Monitoring Suppl (FREESTYLE FREEDOM LITE) w/Device KIT 1 each by Does not apply route daily at 2 am.  . finasteride (PROSCAR) 5 MG tablet Take 5 mg by mouth daily.   Marland Kitchen FREESTYLE LITE test strip TEST DAILY  . Lancets MISC Test blood sugar daily. Dx code: 250.00  . lisinopril (ZESTRIL) 2.5 MG tablet TAKE 1 TABLET BY MOUTH DAILY. ANNUAL APPT IS DUE MUST SEE PROVIDER FOR FUTURE REFILLS  . loratadine (CLARITIN) 10 MG tablet Take 10 mg by mouth daily as needed.   . Misc Natural Products (OSTEO BI-FLEX TRIPLE STRENGTH) TABS Take by mouth.  . Multiple Vitamin (MULTIVITAMIN) capsule Take 1 capsule by mouth daily.  . nitroGLYCERIN (NITROSTAT) 0.4 MG SL tablet If you need to use this medication please inform your cardiologist and Dr. Everlene Farrier.  . silodosin (RAPAFLO) 8 MG CAPS capsule Take 8 mg by mouth at bedtime.   . simvastatin (ZOCOR) 40 MG tablet TAKE 1/2 BY MOUTH EVERY NIGHT AT BEDTIME   No facility-administered encounter medications on file as of 08/06/2020.     Current Diagnosis: Patient Active Problem List   Diagnosis Date Noted  . Erectile dysfunction 07/22/2017  . Neck pain 01/19/2017  . Asthma 07/22/2016  . Prediabetes 07/22/2016  . Abnormal EKG 11/21/2014  . Dyslipidemia 11/21/2014  . Metabolic syndrome 09/98/3382  . Obesity (BMI 30-39.9)   . Essential hypertension 10/27/2011  . Osteoarthritis 10/27/2011  . Atherogenic dyslipidemia; low HDL, in setting of metabolic syndrome 50/53/9767  . Renal cysts, acquired, bilateral 10/27/2011    Goals Addressed   None     Follow-Up:  Coordination of Enhanced Pharmacy Services      Reviewed chart for medication changes ahead of medication coordination call.  On 07/31/2020 patient saw Dr. Quay Burow No medication changes indicated   BP Readings from Last 3 Encounters:  07/31/20 140/82  01/29/20 (!) 144/82  12/26/19 136/74    Lab Results  Component Value Date   HGBA1C 6.0 (H) 07/31/2020     Patient obtains medications through Vials  90 Days   Last adherence delivery included:    Accu-chek guide test strips Bisoprolol 10 mg daily Lisinopril 2.5 mg daily Simvastatin 40 mg bedtime Finasteride 5 mg daily Silodosin 8 mg daily  Patient is due for next adherence delivery on: 08/15/2020 Called patient and reviewed medications and coordinated delivery.  This delivery to include:Accu-chek guide test strips Bisoprolol 10 mg daily Lisinopril 2.5 mg daily Simvastatin 40 mg bedtime Finasteride 5 mg daily Silodosin 8 mg daily   Patient declined the  following medications Albuterol inhaler Softclik mis lancets. Patient stated that he has enough until next fill   Patient needs refills for Accu-chek guide test strips  Confirmed delivery date of 08/15/2020, advised patient that pharmacy will contact them the morning of delivery.    Rosendo Gros, [place] Pharmacist Assistant  [phone]

## 2020-08-13 ENCOUNTER — Other Ambulatory Visit: Payer: Self-pay | Admitting: Internal Medicine

## 2020-08-13 DIAGNOSIS — E119 Type 2 diabetes mellitus without complications: Secondary | ICD-10-CM

## 2020-08-19 ENCOUNTER — Other Ambulatory Visit: Payer: Self-pay

## 2020-08-19 ENCOUNTER — Ambulatory Visit (INDEPENDENT_AMBULATORY_CARE_PROVIDER_SITE_OTHER): Payer: Medicare Other

## 2020-08-19 DIAGNOSIS — Z0000 Encounter for general adult medical examination without abnormal findings: Secondary | ICD-10-CM | POA: Diagnosis not present
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
## 2020-08-19 NOTE — Patient Instructions (Addendum)
[name] , Thank you for taking time to come for your Medicare Wellness Visit. I appreciate your ongoing commitment to your health goals. Please review the following plan we discussed and let me know if I can assist you in the future.   Screening recommendations/referrals: Colonoscopy: 06/27/2019 Recommended yearly ophthalmology/optometry visit for glaucoma screening and checkup Recommended yearly dental visit for hygiene and checkup  Vaccinations: Influenza vaccine: 07/31/2020 Pneumococcal vaccine: completed Tdap vaccine: 03/25/2015; due every 10 years Shingles vaccine: never done   Covid-19: completed  Advanced directives: Please bring a copy of your health care power of attorney and living will to the office at your convenience.  Conditions/risks identified: Yes; Reviewed health maintenance screenings with patient today and relevant education, vaccines, and/or referrals were provided. Please continue to do your personal lifestyle choices by: daily care of teeth and gums, regular physical activity (goal should be 5 days a week for 30 minutes), eat a healthy diet, avoid tobacco and drug use, limiting any alcohol intake, taking a low-dose aspirin (if not allergic or have been advised by your provider otherwise) and taking vitamins and minerals as recommended by your provider. Continue doing brain stimulating activities (puzzles, reading, adult coloring books, staying active) to keep memory sharp. Continue to eat heart healthy diet (full of fruits, vegetables, whole grains, lean protein, water--limit salt, fat, and sugar intake) and increase physical activity as tolerated.  Next appointment: Please schedule your next Medicare Wellness Visit with your Nurse Health Advisor in 1 year by calling [phone]. Preventive Care 76 Years and Older, Male Preventive care refers to lifestyle choices and visits with your health care provider that can promote health and wellness. What does preventive care  include?  A yearly physical exam. This is also called an annual well check.  Dental exams once or twice a year.  Routine eye exams. Ask your health care provider how often you should have your eyes checked.  Personal lifestyle choices, including:  Daily care of your teeth and gums.  Regular physical activity.  Eating a healthy diet.  Avoiding tobacco and drug use.  Limiting alcohol use.  Practicing safe sex.  Taking low doses of aspirin every day.  Taking vitamin and mineral supplements as recommended by your health care provider. What happens during an annual well check? The services and screenings done by your health care provider during your annual well check will depend on your age, overall health, lifestyle risk factors, and family history of disease. Counseling  Your health care provider may ask you questions about your:  Alcohol use.  Tobacco use.  Drug use.  Emotional well-being.  Home and relationship well-being.  Sexual activity.  Eating habits.  History of falls.  Memory and ability to understand (cognition).  Work and work Statistician. Screening  You may have the following tests or measurements:  Height, weight, and BMI.  Blood pressure.  Lipid and cholesterol levels. These may be checked every 5 years, or more frequently if you are over 76 years old.  Skin check.  Lung cancer screening. You may have this screening every year starting at age 76 if you have a 30-pack-year history of smoking and currently smoke or have quit within the past 15 years.  Fecal occult blood test (FOBT) of the stool. You may have this test every year starting at age 76.  Flexible sigmoidoscopy or colonoscopy. You may have a sigmoidoscopy every 5 years or a colonoscopy every 10 years starting at age 76.  Prostate cancer screening. Recommendations  will vary depending on your family history and other risks.  Hepatitis C blood test.  Hepatitis B blood  test.  Sexually transmitted disease (STD) testing.  Diabetes screening. This is done by checking your blood sugar (glucose) after you have not eaten for a while (fasting). You may have this done every 1-3 years.  Abdominal aortic aneurysm (AAA) screening. You may need this if you are a current or former smoker.  Osteoporosis. You may be screened starting at age 76 if you are at high risk. Talk with your health care provider about your test results, treatment options, and if necessary, the need for more tests. Vaccines  Your health care provider may recommend certain vaccines, such as:  Influenza vaccine. This is recommended every year.  Tetanus, diphtheria, and acellular pertussis (Tdap, Td) vaccine. You may need a Td booster every 10 years.  Zoster vaccine. You may need this after age 76.  Pneumococcal 13-valent conjugate (PCV13) vaccine. One dose is recommended after age 76.  Pneumococcal polysaccharide (PPSV23) vaccine. One dose is recommended after age 76. Talk to your health care provider about which screenings and vaccines you need and how often you need them. This information is not intended to replace advice given to you by your health care provider. Make sure you discuss any questions you have with your health care provider. Document Released: 11/28/2015 Document Revised: 07/21/2016 Document Reviewed: 09/02/2015 Elsevier Interactive Patient Education  2017 [place] Prevention in the Home Falls can cause injuries. They can happen to people of all ages. There are many things you can do to make your home safe and to help prevent falls. What can I do on the outside of my home?  Regularly fix the edges of walkways and driveways and fix any cracks.  Remove anything that might make you trip as you walk through a door, such as a raised step or threshold.  Trim any bushes or trees on the path to your home.  Use bright outdoor lighting.  Clear any walking paths of  anything that might make someone trip, such as rocks or tools.  Regularly check to see if handrails are loose or broken. Make sure that both sides of any steps have handrails.  Any raised decks and porches should have guardrails on the edges.  Have any leaves, snow, or ice cleared regularly.  Use sand or salt on walking paths during winter.  Clean up any spills in your garage right away. This includes oil or grease spills. What can I do in the bathroom?  Use night lights.  Install grab bars by the toilet and in the tub and shower. Do not use towel bars as grab bars.  Use non-skid mats or decals in the tub or shower.  If you need to sit down in the shower, use a plastic, non-slip stool.  Keep the floor dry. Clean up any water that spills on the floor as soon as it happens.  Remove soap buildup in the tub or shower regularly.  Attach bath mats securely with double-sided non-slip rug tape.  Do not have throw rugs and other things on the floor that can make you trip. What can I do in the bedroom?  Use night lights.  Make sure that you have a light by your bed that is easy to reach.  Do not use any sheets or blankets that are too big for your bed. They should not hang down onto the floor.  Have a firm chair that  has side arms. You can use this for support while you get dressed.  Do not have throw rugs and other things on the floor that can make you trip. What can I do in the kitchen?  Clean up any spills right away.  Avoid walking on wet floors.  Keep items that you use a lot in easy-to-reach places.  If you need to reach something above you, use a strong step stool that has a grab bar.  Keep electrical cords out of the way.  Do not use floor polish or wax that makes floors slippery. If you must use wax, use non-skid floor wax.  Do not have throw rugs and other things on the floor that can make you trip. What can I do with my stairs?  Do not leave any items on the  stairs.  Make sure that there are handrails on both sides of the stairs and use them. Fix handrails that are broken or loose. Make sure that handrails are as long as the stairways.  Check any carpeting to make sure that it is firmly attached to the stairs. Fix any carpet that is loose or worn.  Avoid having throw rugs at the top or bottom of the stairs. If you do have throw rugs, attach them to the floor with carpet tape.  Make sure that you have a light switch at the top of the stairs and the bottom of the stairs. If you do not have them, ask someone to add them for you. What else can I do to help prevent falls?  Wear shoes that:  Do not have high heels.  Have rubber bottoms.  Are comfortable and fit you well.  Are closed at the toe. Do not wear sandals.  If you use a stepladder:  Make sure that it is fully opened. Do not climb a closed stepladder.  Make sure that both sides of the stepladder are locked into place.  Ask someone to hold it for you, if possible.  Clearly mark and make sure that you can see:  Any grab bars or handrails.  First and last steps.  Where the edge of each step is.  Use tools that help you move around (mobility aids) if they are needed. These include:  Canes.  Walkers.  Scooters.  Crutches.  Turn on the lights when you go into a dark area. Replace any light bulbs as soon as they burn out.  Set up your furniture so you have a clear path. Avoid moving your furniture around.  If any of your floors are uneven, fix them.  If there are any pets around you, be aware of where they are.  Review your medicines with your doctor. Some medicines can make you feel dizzy. This can increase your chance of falling. Ask your doctor what other things that you can do to help prevent falls. This information is not intended to replace advice given to you by your health care provider. Make sure you discuss any questions you have with your health care  provider. Document Released: 08/28/2009 Document Revised: 04/08/2016 Document Reviewed: 12/06/2014 Elsevier Interactive Patient Education  2017 [employer].

## 2020-08-28 ENCOUNTER — Ambulatory Visit: Payer: Medicare Other | Admitting: Pharmacist

## 2020-08-28 ENCOUNTER — Other Ambulatory Visit: Payer: Self-pay

## 2020-08-28 DIAGNOSIS — I10 Essential (primary) hypertension: Secondary | ICD-10-CM

## 2020-08-28 DIAGNOSIS — R7303 Prediabetes: Secondary | ICD-10-CM

## 2020-08-28 DIAGNOSIS — E785 Hyperlipidemia, unspecified: Secondary | ICD-10-CM

## 2020-08-28 DIAGNOSIS — M170 Bilateral primary osteoarthritis of knee: Secondary | ICD-10-CM

## 2020-08-28 NOTE — Chronic Care Management (AMB) (Signed)
Chronic Care Management Pharmacy  Name: [name]  MRN: [id number] DOB: 05-31-1946  Chief Complaint/ HPI  [name],  74 y.o. , male presents for their Follow-Up CCM visit with the clinical pharmacist via telephone due to COVID-19 Pandemic.  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   Office Visits: 07/31/20 Dr Quay Burow OV: advised to limit [facility] powder for arthritis. Pt has lost 10 lbs through lifestyle modifications - goal 200 lbs. No med changes.  01/29/20: Patient presented to Dr. Quay Burow for HTN follow-up. BP in clinic 144/82. No medication changes made.  Consult Visit: 12/26/19: Patient presented to Leonia Reader, NP (Cardiology) for follow-up. Alfuzosin discontinued.  09/07/19 Dr Alyson Ingles (urology): via claims  No Known Allergies  Medications: Outpatient Encounter Medications as of 08/28/2020  Medication Sig  . ACCU-CHEK GUIDE test strip USE TO check blood sugar DAILY AS DIRECTED  . albuterol (VENTOLIN HFA) 108 (90 Base) MCG/ACT inhaler Inhale 2 puffs in the lungs every 4 hours as needed for cough, wheezing, or SOB.  Marland Kitchen bisoprolol-hydrochlorothiazide (ZIAC) 10-6.25 MG tablet TAKE ONE TABLET BY MOUTH ONCE DAILY  . Blood Glucose Monitoring Suppl (BLOOD GLUCOSE METER KIT AND SUPPLIES) Test blood sugar daily. Dx code: 5.00 (Patient taking differently: ACCU-CHECK GUIDE. Test blood sugar daily. Dx code: 250.00)  . Blood Glucose Monitoring Suppl (FREESTYLE FREEDOM LITE) w/Device KIT 1 each by Does not apply route daily at 2 am. (Patient taking differently: 1 each by Does not apply route daily at 2 am. [place])  . finasteride (PROSCAR) 5 MG tablet Take 5 mg by mouth daily.   . Lancets MISC Test blood sugar daily. Dx code: 250.00  . lisinopril (ZESTRIL) 2.5 MG tablet TAKE ONE  TABLET BY MOUTH ONCE DAILY  . loratadine (CLARITIN) 10 MG tablet Take 10 mg by mouth daily as needed.   . Misc Natural Products (OSTEO BI-FLEX TRIPLE STRENGTH) TABS Take by mouth.  . Multiple Vitamin (MULTIVITAMIN) capsule Take 1 capsule by mouth daily.  . nitroGLYCERIN (NITROSTAT) 0.4 MG SL tablet If you need to use this medication please inform your cardiologist and Dr. Everlene Farrier.  . silodosin (RAPAFLO) 8 MG CAPS capsule Take 8 mg by mouth at bedtime.   . simvastatin (ZOCOR) 40 MG tablet TAKE 1/2 TABLET BY MOUTH EVERYDAY AT BEDTIME   No facility-administered encounter medications on file as of 08/28/2020.   Wt Readings from Last 3 Encounters:  07/31/20 242 lb 3.2 oz (109.9 kg)  01/29/20 252 lb (114.3 kg)  12/26/19 254 lb 1.6 oz (115.3 kg)   Current Diagnosis/Assessment:   Goals Addressed            This Visit's Progress   . Pharmacy Care Plan       CARE PLAN ENTRY (see longitudinal plan of care for additional care plan information)  Current Barriers:  . Chronic Disease Management support, education, and care coordination needs related to Hypertension, Hyperlipidemia, Prediabetes, and Osteoarthritis   Hypertension BP Readings from Last 3 Encounters:  01/29/20 (!) 144/82  12/26/19 136/74  07/31/19 140/72 .  Pharmacist Clinical Goal(s): o Over the next 180 days, patient will work with PharmD and providers to achieve BP goal <130/80 . Current regimen:  o Lisinopril 2.5 mg daily  o Bisoprolol-HCTZ 10-6.25 mg daily  . Interventions: o Discussed BP goals and benefits of medications for prevention of heart  attack / stroke . Patient self care activities - Over the next 180 days, patient will: o Check BP 1-2 times weekly, document, and provide at future appointments o Ensure daily salt intake < 2300 mg/day  Hyperlipidemia Lab Results  Component Value Date/Time   LDLCALC 46 07/31/2019 09:54 AM .  Pharmacist Clinical Goal(s): o Over the next 180 days, patient will work with  PharmD and providers to maintain LDL goal < 70 . Current regimen:  o Simvastatin 40 mg - 1/2 tablet daily . Interventions: o Discussed cholesterol goals and benefits of simvastatin for lowering bad cholesterol and preventing heart attack / stroke . Patient self care activities - Over the next 180 days, patient will: o Continue medication as prescribed  Prediabetes Lab Results  Component Value Date/Time   HGBA1C 6.3 01/29/2020 09:16 AM   HGBA1C 6.3 07/31/2019 09:54 AM .  Pharmacist Clinical Goal(s): o Over the next 180 days, patient will work with PharmD and providers to maintain A1c goal <6.5% . Current regimen:  o No medications . Interventions: o Discussed A1c as measure of blood sugar over previous 3 months o An A1c of 6.5 or higher is considered diabetes  o Discussed limiting carbohydrates and sugars in diet . Patient self care activities - Over the next 180 days, patient will: o Check blood sugar 1-2 times weekly, document, and provide at future appointments o Limit carbohydrates in diet ("white foods" - pasta, rice, potatoes, bread)  Osteoarthritis . Pharmacist Clinical Goal(s) o Over the next 180 days, patient will work with PharmD and providers to optimize therapy . Current regimen:  o BC powder . Interventions: o Discussed safety issues with BC powder including stomach bleeding o Recommended Tylenol as safer option to treat pain . Patient self care activities - Over the next 180 days, patient will: o Limit BC powder and use Tylenol Extra Strength 500 mg 1-2 tablets up to every 6 hours  Medication management . Pharmacist Clinical Goal(s): o Over the next 180 days, patient will work with PharmD and providers to achieve optimal medication adherence . Current pharmacy: [facility] . Interventions o Comprehensive medication review performed. o Utilize [facility] pharmacy for medication synchronization, packaging and delivery . Patient self care activities - Over the next 180  days, patient will: o Focus on medication adherence by fill date o Take medications as prescribed o Report any questions or concerns to PharmD and/or provider(s)  Please see past updates related to this goal by clicking on the "Past Updates" button in the selected goal        Pre-Diabetes   A1c goal < 6.5%  Recent Relevant Labs: Lab Results  Component Value Date/Time   HGBA1C 6.0 (H) 07/31/2020 10:01 AM   HGBA1C 6.3 01/29/2020 09:16 AM   MICROALBUR 0.3 11/25/2015 01:40 PM   MICROALBUR 0.3 09/13/2014 01:20 PM    No medications indicated.  Checking BG: several times per week Fasting BG: 90-102  We discussed: A1c definition; A1c threshold for DM diagnosis; importance of diet/exercise changes to prevent diabetes; foods high in carbohydrates and sugar; congratulated patient on 10 lb wt loss  Plan  Continue control with diet and exercise   Hypertension   BP goal is:  <130/80  Office blood pressures are  BP Readings from Last 3 Encounters:  07/31/20 140/82  01/29/20 (!) 144/82  12/26/19 136/74   Kidney Function Lab Results  Component Value Date/Time   CREATININE 1.28 (H) 07/31/2020 10:01 AM   CREATININE 1.27 01/29/2020 09:16 AM  CREATININE 1.25 07/31/2019 09:54 AM   CREATININE 1.19 (H) 11/25/2015 01:40 PM   GFR 66.78 01/29/2020 09:16 AM   GFRNONAA 54 (L) 07/31/2020 10:01 AM   GFRAA 63 07/31/2020 10:01 AM   K 4.4 07/31/2020 10:01 AM   K 4.3 01/29/2020 09:16 AM   Patient checks BP at home several times per month  Patient home BP readings are ranging: 120/75 - 142/80  Patient has failed these meds in the past: n/a Patient is currently controlled on the following medications:   Lisinopril 2.5 mg daily   Bisoprolol-HCTZ 10-6.25 mg daily   We discussed BP goals; pt reports rare occurrences of elevated BP at home (to 140s/80s), mostly stays in 120/70s range.   Plan  Continue current medications   Hyperlipidemia   LDL goal < 100  Lipid Panel       Component Value Date/Time   CHOL 109 07/31/2020 1001   TRIG 96 07/31/2020 1001   HDL 31 (L) 07/31/2020 1001   LDLCALC 60 07/31/2020 1001    Hepatic Function Latest Ref Rng & Units 07/31/2020 01/29/2020 07/31/2019  Total Protein 6.1 - 8.1 g/dL 6.5 7.3 7.3  Albumin 3.5 - 5.2 g/dL - 4.2 4.5  AST 10 - 35 U/L _0 ALT 9 - 46 U/L _1 Alk Phosphatase 39 - 117 U/L - 78 66  Total Bilirubin 0.2 - 1.2 mg/dL 0.6 0.6 0.5  Bilirubin, Direct 0.0 - 0.3 mg/dL - - -     The ASCVD Risk score (Keystone., et al., 2013) failed to calculate for the following reasons:   The valid total cholesterol range is 130 to 320 mg/dL   Patient has failed these meds in past: n/a Patient is currently controlled on the following medications:   Simvastatin 40 mg 1/2 tab daily    We discussed:  Cholesterol goals; HDL vs LDL; benefits of statin for ASCVD risk reduction  Plan  Continue current medications and control with diet and exercise  BPH / OAB   Lab Results  Component Value Date/Time   PSA 1.09 07/22/2017 10:32 AM   PSA 1.35 11/25/2015 01:40 PM   PSA 1.05 09/13/2014 01:20 PM   Patient has failed these meds in past: n/a Patient is currently controlled on the following medications:   Finasteride 5 mg daily   Silodosin 8 mg daily  We discussed:  Patient is satisfied with current regimen and denies issues  Plan  Continue current medications  Osteoarthritis   Patient has failed these meds in past: n/a Patient is currently controlled on the following medications:  . BC powder (aspirin 845 mg)  We discussed:  Safety risks with daily use of BC powder (high dose aspirin); discussed Tylenol as safer alternative  Plan  Recommend to stop BC powder and start Tylenol up to 3000 mg/day  Medication Management   Pt uses [facility] for all medications.  90-day vials Pt endorses 100% compliance  We discussed: Reviewed patient's [facility] medication and Epic medication profile  assuring there are no discrepancies or gaps in therapy. Confirmed all fill dates appropriate and verified with patient that there is a sufficient quantity of all prescribed medications at home. Informed patient to call me any time if needing medications before scheduled deliveries.   Plan  Utilize [facility] pharmacy for medication synchronization, packaging and delivery    Follow up: 6 month phone visit  Charlene Brooke, PharmD, [facility] Clinical Pharmacist [facility] [phone]

## 2020-08-28 NOTE — Patient Instructions (Signed)
Visit Information  Phone number for Pharmacist: [phone]  Goals Addressed            This Visit's Progress   . Pharmacy Care Plan       CARE PLAN ENTRY (see longitudinal plan of care for additional care plan information)  Current Barriers:  . Chronic Disease Management support, education, and care coordination needs related to Hypertension, Hyperlipidemia, Prediabetes, and Osteoarthritis   Hypertension BP Readings from Last 3 Encounters:  01/29/20 (!) 144/82  12/26/19 136/74  07/31/19 140/72 .  Pharmacist Clinical Goal(s): o Over the next 180 days, patient will work with PharmD and providers to achieve BP goal <130/80 . Current regimen:  o Lisinopril 2.5 mg daily  o Bisoprolol-HCTZ 10-6.25 mg daily  . Interventions: o Discussed BP goals and benefits of medications for prevention of heart attack / stroke . Patient self care activities - Over the next 180 days, patient will: o Check BP 1-2 times weekly, document, and provide at future appointments o Ensure daily salt intake < 2300 mg/day  Hyperlipidemia Lab Results  Component Value Date/Time   LDLCALC 46 07/31/2019 09:54 AM .  Pharmacist Clinical Goal(s): o Over the next 180 days, patient will work with PharmD and providers to maintain LDL goal < 70 . Current regimen:  o Simvastatin 40 mg - 1/2 tablet daily . Interventions: o Discussed cholesterol goals and benefits of simvastatin for lowering bad cholesterol and preventing heart attack / stroke . Patient self care activities - Over the next 180 days, patient will: o Continue medication as prescribed  Prediabetes Lab Results  Component Value Date/Time   HGBA1C 6.3 01/29/2020 09:16 AM   HGBA1C 6.3 07/31/2019 09:54 AM .  Pharmacist Clinical Goal(s): o Over the next 180 days, patient will work with PharmD and providers to maintain A1c goal <6.5% . Current regimen:  o No medications . Interventions: o Discussed A1c as measure of blood sugar over previous 3  months o An A1c of 6.5 or higher is considered diabetes  o Discussed limiting carbohydrates and sugars in diet . Patient self care activities - Over the next 180 days, patient will: o Check blood sugar 1-2 times weekly, document, and provide at future appointments o Limit carbohydrates in diet ("white foods" - pasta, rice, potatoes, bread)  Osteoarthritis . Pharmacist Clinical Goal(s) o Over the next 180 days, patient will work with PharmD and providers to optimize therapy . Current regimen:  o BC powder . Interventions: o Discussed safety issues with BC powder including stomach bleeding o Recommended Tylenol as safer option to treat pain . Patient self care activities - Over the next 180 days, patient will: o Limit BC powder and use Tylenol Extra Strength 500 mg 1-2 tablets up to every 6 hours  Medication management . Pharmacist Clinical Goal(s): o Over the next 180 days, patient will work with PharmD and providers to achieve optimal medication adherence . Current pharmacy: [facility] . Interventions o Comprehensive medication review performed. o Utilize [facility] pharmacy for medication synchronization, packaging and delivery . Patient self care activities - Over the next 180 days, patient will: o Focus on medication adherence by fill date o Take medications as prescribed o Report any questions or concerns to PharmD and/or provider(s)  Please see past updates related to this goal by clicking on the "Past Updates" button in the selected goal       Patient verbalizes understanding of instructions provided today.  The pharmacy team will reach out to the patient again over  the next 180 days.   Charlene Brooke, PharmD, BCACP Clinical Pharmacist [facility] [phone]

## 2020-10-08 ENCOUNTER — Telehealth: Payer: Self-pay | Admitting: Pharmacist

## 2020-10-08 NOTE — Progress Notes (Signed)
Chronic Care Management Pharmacy Assistant   Name: [name]  MRN: [id number] DOB: Apr 08, 1946  Reason for Encounter: Medication Review  PCP : [name], MD  Allergies:  No Known Allergies  Medications: Outpatient Encounter Medications as of 10/08/2020  Medication Sig  . ACCU-CHEK GUIDE test strip USE TO check blood sugar DAILY AS DIRECTED  . albuterol (VENTOLIN HFA) 108 (90 Base) MCG/ACT inhaler Inhale 2 puffs in the lungs every 4 hours as needed for cough, wheezing, or SOB.  Marland Kitchen bisoprolol-hydrochlorothiazide (ZIAC) 10-6.25 MG tablet TAKE ONE TABLET BY MOUTH ONCE DAILY  . Blood Glucose Monitoring Suppl (BLOOD GLUCOSE METER KIT AND SUPPLIES) Test blood sugar daily. Dx code: 24.00 (Patient taking differently: ACCU-CHECK GUIDE. Test blood sugar daily. Dx code: 250.00)  . Blood Glucose Monitoring Suppl (FREESTYLE FREEDOM LITE) w/Device KIT 1 each by Does not apply route daily at 2 am. (Patient taking differently: 1 each by Does not apply route daily at 2 am. [place])  . finasteride (PROSCAR) 5 MG tablet Take 5 mg by mouth daily.   . Lancets MISC Test blood sugar daily. Dx code: 250.00  . lisinopril (ZESTRIL) 2.5 MG tablet TAKE ONE TABLET BY MOUTH ONCE DAILY  . loratadine (CLARITIN) 10 MG tablet Take 10 mg by mouth daily as needed.   . Misc Natural Products (OSTEO BI-FLEX TRIPLE STRENGTH) TABS Take by mouth.  . Multiple Vitamin (MULTIVITAMIN) capsule Take 1 capsule by mouth daily.  . nitroGLYCERIN (NITROSTAT) 0.4 MG SL tablet If you need to use this medication please inform your cardiologist and Dr. Everlene Farrier.  . silodosin (RAPAFLO) 8 MG CAPS capsule Take 8 mg by mouth at bedtime.   . simvastatin (ZOCOR) 40 MG tablet TAKE 1/2 TABLET BY MOUTH EVERYDAY AT BEDTIME   No facility-administered encounter medications on file as of 10/08/2020.    Current Diagnosis: Patient Active Problem List   Diagnosis Date Noted  . Erectile dysfunction 07/22/2017  . Neck pain 01/19/2017    . Asthma 07/22/2016  . Prediabetes 07/22/2016  . Abnormal EKG 11/21/2014  . Dyslipidemia 11/21/2014  . Metabolic syndrome 32/44/0102  . Obesity (BMI 30-39.9)   . Essential hypertension 10/27/2011  . Osteoarthritis 10/27/2011  . Atherogenic dyslipidemia; low HDL, in setting of metabolic syndrome 72/53/6644  . Renal cysts, acquired, bilateral 10/27/2011    Goals Addressed   None     Follow-Up:  Coordination of Enhanced Pharmacy Services   Reviewed chart for medication changes ahead of medication coordination call.  No OVs, Consults, or hospital visits since last care coordination call/Pharmacist visit. No medication changes indicated.  BP Readings from Last 3 Encounters:  07/31/20 140/82  01/29/20 (!) 144/82  12/26/19 136/74    Lab Results  Component Value Date   HGBA1C 6.0 (H) 07/31/2020     Patient obtains medications through Vials  90 Days   Last adherence delivery included:   Finasteride 34m daily  Accu-Chek Test Strips  Lisinopril 2.531mdaily  Simvastatin 4071m1/2 tablet daily  Bisoprolol-Hydrochlorothiazide 10-6.5mg47mily   Patient is due for next adherence delivery on: 11/12/2020. Called patient and reviewed medications and coordinated delivery.   Patient declined the following medications  Finasteride 5mg 69mly  Accu-Chek Test Strips  Lisinopril 2.5mg d64my  Simvastatin 40mg  60mtablet daily  Bisoprolol-Hydrochlorothiazide 10-6.5mg dai44m  The patient received a 90-day supply on 08/14/2020 supply ends on 11/16/2020  Deven CrRosendo Gros [facility] Team Manager/ CPA (Clinical Pharmacist Assistant) [phone]

## 2020-10-14 ENCOUNTER — Telehealth: Payer: Self-pay | Admitting: Pharmacist

## 2020-10-14 NOTE — Progress Notes (Signed)
° ° °  Chronic Care Management Pharmacy Assistant   Name: [name]  MRN: [id number] DOB: 11-Jul-1946  Reason for Encounter: Chart Review   PCP : [name], MD  Allergies:  No Known Allergies  Medications: Outpatient Encounter Medications as of 10/14/2020  Medication Sig   ACCU-CHEK GUIDE test strip USE TO check blood sugar DAILY AS DIRECTED   albuterol (VENTOLIN HFA) 108 (90 Base) MCG/ACT inhaler Inhale 2 puffs in the lungs every 4 hours as needed for cough, wheezing, or SOB.   bisoprolol-hydrochlorothiazide (ZIAC) 10-6.25 MG tablet TAKE ONE TABLET BY MOUTH ONCE DAILY   Blood Glucose Monitoring Suppl (BLOOD GLUCOSE METER KIT AND SUPPLIES) Test blood sugar daily. Dx code: 85.00 (Patient taking differently: ACCU-CHECK GUIDE. Test blood sugar daily. Dx code: 250.00)   Blood Glucose Monitoring Suppl (FREESTYLE FREEDOM LITE) w/Device KIT 1 each by Does not apply route daily at 2 am. (Patient taking differently: 1 each by Does not apply route daily at 2 am. ACCU-CHECK GUIDE)   finasteride (PROSCAR) 5 MG tablet Take 5 mg by mouth daily.    Lancets MISC Test blood sugar daily. Dx code: 250.00   lisinopril (ZESTRIL) 2.5 MG tablet TAKE ONE TABLET BY MOUTH ONCE DAILY   loratadine (CLARITIN) 10 MG tablet Take 10 mg by mouth daily as needed.    Misc Natural Products (OSTEO BI-FLEX TRIPLE STRENGTH) TABS Take by mouth.   Multiple Vitamin (MULTIVITAMIN) capsule Take 1 capsule by mouth daily.   nitroGLYCERIN (NITROSTAT) 0.4 MG SL tablet If you need to use this medication please inform your cardiologist and Dr. Everlene Farrier.   silodosin (RAPAFLO) 8 MG CAPS capsule Take 8 mg by mouth at bedtime.    simvastatin (ZOCOR) 40 MG tablet TAKE 1/2 TABLET BY MOUTH EVERYDAY AT BEDTIME   No facility-administered encounter medications on file as of 10/14/2020.    Current Diagnosis: Patient Active Problem List   Diagnosis Date Noted   Erectile dysfunction 07/22/2017   Neck pain 01/19/2017    Asthma 07/22/2016   Prediabetes 07/22/2016   Abnormal EKG 11/21/2014   Dyslipidemia 34/28/7681   Metabolic syndrome 15/72/6203   Obesity (BMI 30-39.9)    Essential hypertension 10/27/2011   Osteoarthritis 10/27/2011   Atherogenic dyslipidemia; low HDL, in setting of metabolic syndrome 55/97/4163   Renal cysts, acquired, bilateral 10/27/2011    Goals Addressed   None     Follow-Up:      Wendy Poet, Clinical Pharmacist Assistant [facility]

## 2020-11-05 ENCOUNTER — Telehealth: Payer: Self-pay | Admitting: Pharmacist

## 2020-11-05 NOTE — Progress Notes (Signed)
Chronic Care Management Pharmacy Assistant   Name: [name]  MRN: [id number] DOB: 06-27-1946  Reason for Encounter: Medication Review  Patient Questions:  1.  Have you seen any other providers since your last visit? No  2.  Any changes in your medicines or health? No   PCP : [name], MD  Allergies:  No Known Allergies  Medications: Outpatient Encounter Medications as of 11/05/2020  Medication Sig  . ACCU-CHEK GUIDE test strip USE TO check blood sugar DAILY AS DIRECTED  . albuterol (VENTOLIN HFA) 108 (90 Base) MCG/ACT inhaler Inhale 2 puffs in the lungs every 4 hours as needed for cough, wheezing, or SOB.  Marland Kitchen bisoprolol-hydrochlorothiazide (ZIAC) 10-6.25 MG tablet TAKE ONE TABLET BY MOUTH ONCE DAILY  . Blood Glucose Monitoring Suppl (BLOOD GLUCOSE METER KIT AND SUPPLIES) Test blood sugar daily. Dx code: 71.00 (Patient taking differently: ACCU-CHECK GUIDE. Test blood sugar daily. Dx code: 250.00)  . Blood Glucose Monitoring Suppl (FREESTYLE FREEDOM LITE) w/Device KIT 1 each by Does not apply route daily at 2 am. (Patient taking differently: 1 each by Does not apply route daily at 2 am. [place])  . finasteride (PROSCAR) 5 MG tablet Take 5 mg by mouth daily.   . Lancets MISC Test blood sugar daily. Dx code: 250.00  . lisinopril (ZESTRIL) 2.5 MG tablet TAKE ONE TABLET BY MOUTH ONCE DAILY  . loratadine (CLARITIN) 10 MG tablet Take 10 mg by mouth daily as needed.   . Misc Natural Products (OSTEO BI-FLEX TRIPLE STRENGTH) TABS Take by mouth.  . Multiple Vitamin (MULTIVITAMIN) capsule Take 1 capsule by mouth daily.  . nitroGLYCERIN (NITROSTAT) 0.4 MG SL tablet If you need to use this medication please inform your cardiologist and Dr. Everlene Farrier.  . silodosin (RAPAFLO) 8 MG CAPS capsule Take 8 mg by mouth at bedtime.   . simvastatin (ZOCOR) 40 MG tablet TAKE 1/2 TABLET BY MOUTH EVERYDAY AT BEDTIME   No facility-administered encounter medications on file as of 11/05/2020.     Current Diagnosis: Patient Active Problem List   Diagnosis Date Noted  . Erectile dysfunction 07/22/2017  . Neck pain 01/19/2017  . Asthma 07/22/2016  . Prediabetes 07/22/2016  . Abnormal EKG 11/21/2014  . Dyslipidemia 11/21/2014  . Metabolic syndrome 28/76/8115  . Obesity (BMI 30-39.9)   . Essential hypertension 10/27/2011  . Osteoarthritis 10/27/2011  . Atherogenic dyslipidemia; low HDL, in setting of metabolic syndrome 72/62/0355  . Renal cysts, acquired, bilateral 10/27/2011    Goals Addressed   None     Follow-Up:  Coordination of Enhanced Pharmacy Services   Reviewed chart for medication changes ahead of medication coordination call.  No OVs, Consults, or hospital visits since last care coordination call/Pharmacist visit. No medication changes indicated  BP Readings from Last 3 Encounters:  07/31/20 140/82  01/29/20 (!) 144/82  12/26/19 136/74    Lab Results  Component Value Date   HGBA1C 6.0 (H) 07/31/2020     Patient obtains medications through Vials  90 Days   Last adherence delivery included:   Accu-Check test strips  Albuterol Aer Hfa two puff every 4 hours PRN Bisoprolol-Hydrochlorothiazide 10-6.25mg daily  Finasteride 50m daily Lisinopril 2.530mdaily  Silodosin 12m53maily Simvastatin 52m24mily   Patient is due for next adherence delivery on: 11/13/2020. Called patient and reviewed medications and coordinated delivery.  This delivery to include:  Accu-Check test strips  Albuterol Aer Hfa two puff every 4 hours PRN Bisoprolol-Hydrochlorothiazide 10-6.25mg daily  Finasteride 5mg 21mly  Lisinopril 2.66m daily  Silodosin 855mdaily Simvastatin 4039maily   Confirmed delivery date of 11/13/2020, advised patient that pharmacy will contact them the morning of delivery.  DevRosendo [facility]ros[facility] Team Manager/ CPA (Clinical Pharmacist Assistant) [phone]

## 2020-11-18 ENCOUNTER — Telehealth: Payer: Self-pay | Admitting: Pharmacist

## 2020-11-18 NOTE — Telephone Encounter (Signed)
Received call from patient, he has questions about urinary medications.  Patient is taking both alfuzosin 10 mg and silodosin 8 mg daily. Both are prescribed by his urologist who renewed them a few months ago. Alfuzosin was removed from Epic med list in 2021, so I added it back to list since urologist has renewed it.  Discussed indication and mechanisms of medications and advised it is acceptable and beneficial to take both. Pt denies side effects and reports urinary issues are under control currently. Pt appreciative of advice, no further questions.   13 minutes spent in discussion with patient and documentation.  Garry Heater [place], [facility] 11/18/20 10:46 AM

## 2020-12-04 ENCOUNTER — Telehealth: Payer: Self-pay | Admitting: Pharmacist

## 2020-12-04 NOTE — Progress Notes (Signed)
Chronic Care Management Pharmacy Assistant   Name: [name]  MRN: [id number] DOB: [birth year]  Reason for Encounter: Medication Review  Patient Questions:  1.  Have you seen any other providers since your last visit? No  2.  Any changes in your medicines or health? No    PCP : [name], MD  Allergies:  No Known Allergies  Medications: Outpatient Encounter Medications as of 12/04/2020  Medication Sig  . ACCU-CHEK GUIDE test strip USE TO check blood sugar DAILY AS DIRECTED  . albuterol (VENTOLIN HFA) 108 (90 Base) MCG/ACT inhaler Inhale 2 puffs in the lungs every 4 hours as needed for cough, wheezing, or SOB.  Marland Kitchen alfuzosin (UROXATRAL) 10 MG 24 hr tablet Take 10 mg by mouth daily with breakfast.  . bisoprolol-hydrochlorothiazide (ZIAC) 10-6.25 MG tablet TAKE ONE TABLET BY MOUTH ONCE DAILY  . Blood Glucose Monitoring Suppl (BLOOD GLUCOSE METER KIT AND SUPPLIES) Test blood sugar daily. Dx code: 15.00 (Patient taking differently: ACCU-CHECK GUIDE. Test blood sugar daily. Dx code: 250.00)  . Blood Glucose Monitoring Suppl (FREESTYLE FREEDOM LITE) w/Device KIT 1 each by Does not apply route daily at 2 am. (Patient taking differently: 1 each by Does not apply route daily at 2 am. [place])  . finasteride (PROSCAR) 5 MG tablet Take 5 mg by mouth daily.   . Lancets MISC Test blood sugar daily. Dx code: 250.00  . lisinopril (ZESTRIL) 2.5 MG tablet TAKE ONE TABLET BY MOUTH ONCE DAILY  . loratadine (CLARITIN) 10 MG tablet Take 10 mg by mouth daily as needed.   . Misc Natural Products (OSTEO BI-FLEX TRIPLE STRENGTH) TABS Take by mouth.  . Multiple Vitamin (MULTIVITAMIN) capsule Take 1 capsule by mouth daily.  . nitroGLYCERIN (NITROSTAT) 0.4 MG SL tablet If you need to use this medication please inform your cardiologist and Dr. Everlene Farrier.  . silodosin (RAPAFLO) 8 MG CAPS capsule Take 8 mg by mouth at bedtime.   . simvastatin (ZOCOR) 40 MG tablet TAKE 1/2 TABLET BY MOUTH EVERYDAY  AT BEDTIME   No facility-administered encounter medications on file as of 12/04/2020.    Current Diagnosis: Patient Active Problem List   Diagnosis Date Noted  . Erectile dysfunction 07/22/2017  . Neck pain 01/19/2017  . Asthma 07/22/2016  . Prediabetes 07/22/2016  . Abnormal EKG 11/21/2014  . Dyslipidemia 11/21/2014  . Metabolic syndrome 02/58/5277  . Obesity (BMI 30-39.9)   . Essential hypertension 10/27/2011  . Osteoarthritis 10/27/2011  . Atherogenic dyslipidemia; low HDL, in setting of metabolic syndrome 82/42/3536  . Renal cysts, acquired, bilateral 10/27/2011    Goals Addressed   None     Follow-Up:  Coordination of Enhanced Pharmacy Services   Reviewed chart for medication changes ahead of medication coordination call.  No OVs, Consults, or hospital visits since last care coordination call/Pharmacist visit.  No medication changes indicated   BP Readings from Last 3 Encounters:  07/31/20 140/82  01/29/20 (!) 144/82  12/26/19 136/74    Lab Results  Component Value Date   HGBA1C 6.0 (H) 07/31/2020     Patient obtains medications through Vials  90 Days   Last adherence delivery included:  Accu-Check test strips  Albuterol Aer Hfa two puff every 4 hours PRN Bisoprolol-Hydrochlorothiazide 10-6.25mg daily  Finasteride 35m daily Lisinopril 2.557mdaily  Silodosin 67m27maily Simvastatin 58m1mily    Patient is due for next adherence delivery on: 12/11/2020. Called patient and reviewed medications and coordinated delivery.  This delivery to include: Albuterol  Aer Hfa two puffs every 4 hours PRN   Confirmed delivery date of 12/11/2020, advised patient that pharmacy will contact them the morning of delivery.   Wendy Poet, [place] [phone]

## 2020-12-15 ENCOUNTER — Telehealth: Payer: Self-pay | Admitting: Pharmacist

## 2020-12-15 NOTE — Progress Notes (Signed)
Chronic Care Management Pharmacy Assistant   Name: [name]  MRN: [id number] DOB: 27-Jul-1946  Reason for Encounter: Chart Review   PCP : [name], MD  Allergies:  No Known Allergies  Medications: Outpatient Encounter Medications as of 12/15/2020  Medication Sig   ACCU-CHEK GUIDE test strip USE TO check blood sugar DAILY AS DIRECTED   albuterol (VENTOLIN HFA) 108 (90 Base) MCG/ACT inhaler Inhale 2 puffs in the lungs every 4 hours as needed for cough, wheezing, or SOB.   alfuzosin (UROXATRAL) 10 MG 24 hr tablet Take 10 mg by mouth daily with breakfast.   bisoprolol-hydrochlorothiazide (ZIAC) 10-6.25 MG tablet TAKE ONE TABLET BY MOUTH ONCE DAILY   Blood Glucose Monitoring Suppl (BLOOD GLUCOSE METER KIT AND SUPPLIES) Test blood sugar daily. Dx code: 32.00 (Patient taking differently: ACCU-CHECK GUIDE. Test blood sugar daily. Dx code: 250.00)   Blood Glucose Monitoring Suppl (FREESTYLE FREEDOM LITE) w/Device KIT 1 each by Does not apply route daily at 2 am. (Patient taking differently: 1 each by Does not apply route daily at 2 am. ACCU-CHECK GUIDE)   finasteride (PROSCAR) 5 MG tablet Take 5 mg by mouth daily.    Lancets MISC Test blood sugar daily. Dx code: 250.00   lisinopril (ZESTRIL) 2.5 MG tablet TAKE ONE TABLET BY MOUTH ONCE DAILY   loratadine (CLARITIN) 10 MG tablet Take 10 mg by mouth daily as needed.    Misc Natural Products (OSTEO BI-FLEX TRIPLE STRENGTH) TABS Take by mouth.   Multiple Vitamin (MULTIVITAMIN) capsule Take 1 capsule by mouth daily.   nitroGLYCERIN (NITROSTAT) 0.4 MG SL tablet If you need to use this medication please inform your cardiologist and Dr. Everlene Farrier.   silodosin (RAPAFLO) 8 MG CAPS capsule Take 8 mg by mouth at bedtime.    simvastatin (ZOCOR) 40 MG tablet TAKE 1/2 TABLET BY MOUTH EVERYDAY AT BEDTIME   No facility-administered encounter medications on file as of 12/15/2020.    Current Diagnosis: Patient Active Problem List    Diagnosis Date Noted   Erectile dysfunction 07/22/2017   Neck pain 01/19/2017   Asthma 07/22/2016   Prediabetes 07/22/2016   Abnormal EKG 11/21/2014   Dyslipidemia 27/04/2375   Metabolic syndrome 28/31/5176   Obesity (BMI 30-39.9)    Essential hypertension 10/27/2011   Osteoarthritis 10/27/2011   Atherogenic dyslipidemia; low HDL, in setting of metabolic syndrome 16/05/3709   Renal cysts, acquired, bilateral 10/27/2011    Goals Addressed   None     Follow-Up:  Pharmacist Review   Reviewed chart for medication changes and adherence. There has been no changes in patient medications. Patient is on track with goals.   No gaps in adherence identified. Patient has follow up scheduled with pharmacy team. No further action required.   Wendy Poet, [place] [phone]

## 2021-01-06 ENCOUNTER — Telehealth: Payer: Self-pay | Admitting: Pharmacist

## 2021-01-06 NOTE — Progress Notes (Signed)
    Chronic Care Management Pharmacy Assistant   Name: [name]  MRN: [id number] DOB: February 29, 1946  Reason for Encounter: Medication Review   PCP : [name], MD  Allergies:  No Known Allergies  Medications: Outpatient Encounter Medications as of 01/06/2021  Medication Sig  . ACCU-CHEK GUIDE test strip USE TO check blood sugar DAILY AS DIRECTED  . albuterol (VENTOLIN HFA) 108 (90 Base) MCG/ACT inhaler Inhale 2 puffs in the lungs every 4 hours as needed for cough, wheezing, or SOB.  Marland Kitchen alfuzosin (UROXATRAL) 10 MG 24 hr tablet Take 10 mg by mouth daily with breakfast.  . bisoprolol-hydrochlorothiazide (ZIAC) 10-6.25 MG tablet TAKE ONE TABLET BY MOUTH ONCE DAILY  . Blood Glucose Monitoring Suppl (BLOOD GLUCOSE METER KIT AND SUPPLIES) Test blood sugar daily. Dx code: 53.00 (Patient taking differently: ACCU-CHECK GUIDE. Test blood sugar daily. Dx code: 250.00)  . Blood Glucose Monitoring Suppl (FREESTYLE FREEDOM LITE) w/Device KIT 1 each by Does not apply route daily at 2 am. (Patient taking differently: 1 each by Does not apply route daily at 2 am. [place])  . finasteride (PROSCAR) 5 MG tablet Take 5 mg by mouth daily.   . Lancets MISC Test blood sugar daily. Dx code: 250.00  . lisinopril (ZESTRIL) 2.5 MG tablet TAKE ONE TABLET BY MOUTH ONCE DAILY  . loratadine (CLARITIN) 10 MG tablet Take 10 mg by mouth daily as needed.   . Misc Natural Products (OSTEO BI-FLEX TRIPLE STRENGTH) TABS Take by mouth.  . Multiple Vitamin (MULTIVITAMIN) capsule Take 1 capsule by mouth daily.  . nitroGLYCERIN (NITROSTAT) 0.4 MG SL tablet If you need to use this medication please inform your cardiologist and Dr. Everlene Farrier.  . silodosin (RAPAFLO) 8 MG CAPS capsule Take 8 mg by mouth at bedtime.   . simvastatin (ZOCOR) 40 MG tablet TAKE 1/2 TABLET BY MOUTH EVERYDAY AT BEDTIME   No facility-administered encounter medications on file as of 01/06/2021.    Current Diagnosis: Patient Active Problem  List   Diagnosis Date Noted  . Erectile dysfunction 07/22/2017  . Neck pain 01/19/2017  . Asthma 07/22/2016  . Prediabetes 07/22/2016  . Abnormal EKG 11/21/2014  . Dyslipidemia 11/21/2014  . Metabolic syndrome 09/64/3838  . Obesity (BMI 30-39.9)   . Essential hypertension 10/27/2011  . Osteoarthritis 10/27/2011  . Atherogenic dyslipidemia; low HDL, in setting of metabolic syndrome 18/40/3754  . Renal cysts, acquired, bilateral 10/27/2011    Goals Addressed   None       BP Readings from Last 3 Encounters:  07/31/20 140/82  01/29/20 (!) 144/82  12/26/19 136/74    Lab Results  Component Value Date   HGBA1C 6.0 (H) 07/31/2020     Patient obtains medications through Adherence Packaging  90 Days   Last adherence delivery included (date:11/10/20): (medication name and frequency) Accu-Check test strips  Albuterol Aer Hfa two puff every 4 hours PRN Bisoprolol-Hydrochlorothiazide 10-6.25mg  daily  Finasteride 5mg  daily Lisinopril 2.5mg  daily  Silodosin 8mg  daily Simvastatin 40mg  daily  Patient declined last delivery due to patient received a 90 day supply which ends on 02/22/21   Patient is due for next adherence delivery on: 02/22/21. Called patient and reviewed medication needs.  Patient declined need for refills at this time. Patient is aware of how to contact pharmacist if refills are needed prior to next adherence delivery.   Wendy Poet, [place] [phone]   Time spent:20

## 2021-02-01 NOTE — Patient Instructions (Addendum)
    Blood work was ordered.     Medications changes include :  none   Your prescription(s) have been submitted to your pharmacy. Please take as directed and contact our office if you believe you are having problem(s) with the medication(s).   Please followup in 6 months  
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
## 2021-02-02 ENCOUNTER — Ambulatory Visit (INDEPENDENT_AMBULATORY_CARE_PROVIDER_SITE_OTHER): Payer: Medicare Other | Admitting: Internal Medicine

## 2021-02-02 ENCOUNTER — Encounter: Payer: Self-pay | Admitting: Internal Medicine

## 2021-02-02 ENCOUNTER — Telehealth: Payer: Self-pay | Admitting: Internal Medicine

## 2021-02-02 ENCOUNTER — Other Ambulatory Visit: Payer: Self-pay

## 2021-02-02 VITALS — BP 132/76 | HR 60 | Temp 98.2°F | Ht 68.0 in | Wt 249.0 lb

## 2021-02-02 DIAGNOSIS — R7303 Prediabetes: Secondary | ICD-10-CM | POA: Diagnosis not present

## 2021-02-02 DIAGNOSIS — E785 Hyperlipidemia, unspecified: Secondary | ICD-10-CM

## 2021-02-02 DIAGNOSIS — J4520 Mild intermittent asthma, uncomplicated: Secondary | ICD-10-CM

## 2021-02-02 DIAGNOSIS — I10 Essential (primary) hypertension: Secondary | ICD-10-CM

## 2021-02-02 DIAGNOSIS — M79601 Pain in right arm: Secondary | ICD-10-CM | POA: Diagnosis not present

## 2021-02-02 LAB — CBC WITH DIFFERENTIAL/PLATELET
Basophils Absolute: 0 10*3/uL (ref 0.0–0.1)
Basophils Relative: 1.1 % (ref 0.0–3.0)
Eosinophils Absolute: 0.1 10*3/uL (ref 0.0–0.7)
Eosinophils Relative: 3.3 % (ref 0.0–5.0)
HCT: 42.9 % (ref 39.0–52.0)
Hemoglobin: 14.6 g/dL (ref 13.0–17.0)
Lymphocytes Relative: 33.8 % (ref 12.0–46.0)
Lymphs Abs: 1.4 10*3/uL (ref 0.7–4.0)
MCHC: 34.1 g/dL (ref 30.0–36.0)
MCV: 85.8 fl (ref 78.0–100.0)
Monocytes Absolute: 0.5 10*3/uL (ref 0.1–1.0)
Monocytes Relative: 12.8 % — ABNORMAL HIGH (ref 3.0–12.0)
Neutro Abs: 2.1 10*3/uL (ref 1.4–7.7)
Neutrophils Relative %: 49 % (ref 43.0–77.0)
Platelets: 167 10*3/uL (ref 150.0–400.0)
RBC: 5 Mil/uL (ref 4.22–5.81)
RDW: 14.7 % (ref 11.5–15.5)
WBC: 4.3 10*3/uL (ref 4.0–10.5)

## 2021-02-02 LAB — LIPID PANEL
Cholesterol: 117 mg/dL (ref 0–200)
HDL: 38.5 mg/dL — ABNORMAL LOW (ref >39.00)
LDL Cholesterol: 57 mg/dL (ref 0–99)
NonHDL: 78.89
Total CHOL/HDL Ratio: 3
Triglycerides: 109 mg/dL (ref 0.0–149.0)
VLDL: 21.8 mg/dL (ref 0.0–40.0)

## 2021-02-02 LAB — COMPREHENSIVE METABOLIC PANEL
ALT: 18 U/L (ref 0–53)
AST: 21 U/L (ref 0–37)
Albumin: 4.5 g/dL (ref 3.5–5.2)
Alkaline Phosphatase: 65 U/L (ref 39–117)
BUN: 14 mg/dL (ref 6–23)
CO2: 28 mEq/L (ref 19–32)
Calcium: 9.4 mg/dL (ref 8.4–10.5)
Chloride: 105 mEq/L (ref 96–112)
Creatinine, Ser: 1.26 mg/dL (ref 0.40–1.50)
GFR: 55.35 mL/min — ABNORMAL LOW (ref >60.00)
Glucose, Bld: 109 mg/dL — ABNORMAL HIGH (ref 70–99)
Potassium: 4 mEq/L (ref 3.5–5.1)
Sodium: 140 mEq/L (ref 135–145)
Total Bilirubin: 0.6 mg/dL (ref 0.2–1.2)
Total Protein: 6.9 g/dL (ref 6.0–8.3)

## 2021-02-02 LAB — HEMOGLOBIN A1C: Hgb A1c MFr Bld: 6.4 % (ref 4.6–6.5)

## 2021-02-02 NOTE — Telephone Encounter (Signed)
Dr. Quay Burow has not resulted lab results yet.

## 2021-02-02 NOTE — Addendum Note (Signed)
Addended by: Jacobo Forest on: 02/02/2021 09:06 AM   Modules accepted: Orders

## 2021-02-02 NOTE — Assessment & Plan Note (Signed)
Chronic Mild, intermittent Controlled Continue albuterol inhaler prn

## 2021-02-02 NOTE — Assessment & Plan Note (Signed)
Chronic Check a1c Low sugar / carb diet Stressed regular exercise  

## 2021-02-02 NOTE — Assessment & Plan Note (Addendum)
Chronic BP well controlled Continue lisinopril 2.5 mg qd, ziac 10-6.25 mg qd cmp

## 2021-02-02 NOTE — Telephone Encounter (Signed)
    Patient calling for lab results 

## 2021-02-02 NOTE — Assessment & Plan Note (Signed)
Chronic Check lipid panel  Continue simvastatin 20 mg daily Regular exercise and healthy diet encouraged  

## 2021-02-02 NOTE — Assessment & Plan Note (Signed)
BMI > 35 - 37.86 with htn, hyperlipidemia Encouraged weight loss stressed regular exercise, healthy diet

## 2021-02-02 NOTE — Assessment & Plan Note (Signed)
New Right arm discomfort at night only - none during day No N/T or weakenss ? radiculopathy He sleeps on his right side Taking tylenol Does not want further eval at this time He will let me know if he wants to be referred for further evaluation

## 2021-02-03 NOTE — Telephone Encounter (Signed)
Your cholesterol is good. Your kidney function is slightly decreased. Try to drink water throughout the day, limit salt intake, work on weight loss and we need to keep your blood pressure and sugar well controlled - these will help protect your kidneys. Your liver tests and blood counts are normal. Your sugars are in the prediabetic range - you are close to becoming a diabetic.

## 2021-02-04 ENCOUNTER — Telehealth: Payer: Self-pay | Admitting: Pharmacist

## 2021-02-04 NOTE — Telephone Encounter (Signed)
Spoke with patient today. 

## 2021-02-04 NOTE — Progress Notes (Addendum)
° ° °  Chronic Care Management Pharmacy Assistant   Name: [name]  MRN: [id number] DOB: 1946/08/09   Reason for Encounter: Medication Coordination Call    Medications: Outpatient Encounter Medications as of 02/04/2021  Medication Sig   ACCU-CHEK GUIDE test strip USE TO check blood sugar DAILY AS DIRECTED   Acetaminophen (TYLENOL ARTHRITIS PAIN PO) Take by mouth.   albuterol (VENTOLIN HFA) 108 (90 Base) MCG/ACT inhaler Inhale 2 puffs in the lungs every 4 hours as needed for cough, wheezing, or SOB.   alfuzosin (UROXATRAL) 10 MG 24 hr tablet Take 10 mg by mouth daily with breakfast.   bisoprolol-hydrochlorothiazide (ZIAC) 10-6.25 MG tablet TAKE ONE TABLET BY MOUTH ONCE DAILY   Blood Glucose Monitoring Suppl (FREESTYLE FREEDOM LITE) w/Device KIT 1 each by Does not apply route daily at 2 am. (Patient taking differently: 1 each by Does not apply route daily at 2 am. ACCU-CHECK GUIDE)   finasteride (PROSCAR) 5 MG tablet Take 5 mg by mouth daily.    Lancets MISC Test blood sugar daily. Dx code: 250.00   lisinopril (ZESTRIL) 2.5 MG tablet TAKE ONE TABLET BY MOUTH ONCE DAILY   loratadine (CLARITIN) 10 MG tablet Take 10 mg by mouth daily as needed.    Misc Natural Products (OSTEO BI-FLEX TRIPLE STRENGTH) TABS Take by mouth.   Multiple Vitamin (MULTIVITAMIN) capsule Take 1 capsule by mouth daily.   nitroGLYCERIN (NITROSTAT) 0.4 MG SL tablet If you need to use this medication please inform your cardiologist and Dr. Everlene Farrier.   silodosin (RAPAFLO) 8 MG CAPS capsule Take 8 mg by mouth at bedtime.    simvastatin (ZOCOR) 40 MG tablet TAKE 1/2 TABLET BY MOUTH EVERYDAY AT BEDTIME   No facility-administered encounter medications on file as of 02/04/2021.    Reviewed chart for medication changes ahead of medication coordination call.    BP Readings from Last 3 Encounters:  02/02/21 132/76  07/31/20 140/82  01/29/20 (!) 144/82    Lab Results  Component Value Date   HGBA1C 6.4 02/02/2021      Patient obtains medications through Adherence Packaging  90 Days   Last adherence delivery included (date:01/06/21): Accu-Check test strips  Albuterol Aer Hfa two puff every 4 hours PRN Bisoprolol-Hydrochlorothiazide 10-6.25mg  daily  Finasteride 5mg  daily Lisinopril 2.5mg  daily  Silodosin 8mg  daily Simvastatin 40mg  daily    Patient is due for next adherence delivery on: 04/05/21 Called patient and reviewed medication needs.  Patient declined need for refills at this time. Patient is aware of how to contact pharmacist if refills are needed prior to next adherence delivery.   Wendy Poet, [place] [phone]

## 2021-02-10 ENCOUNTER — Telehealth: Payer: Self-pay | Admitting: Pharmacist

## 2021-02-10 NOTE — Progress Notes (Signed)
Chronic Care Management Pharmacy Assistant   Name: [name]  MRN: [id number] DOB: 05/21/1946   Reason for Encounter: Chart Review     Medications: Outpatient Encounter Medications as of 02/10/2021  Medication Sig  . ACCU-CHEK GUIDE test strip USE TO check blood sugar DAILY AS DIRECTED  . Acetaminophen (TYLENOL ARTHRITIS PAIN PO) Take by mouth.  . albuterol (VENTOLIN HFA) 108 (90 Base) MCG/ACT inhaler Inhale 2 puffs in the lungs every 4 hours as needed for cough, wheezing, or SOB.  . alfuzosin (UROXATRAL) 10 MG 24 hr tablet Take 10 mg by mouth daily with breakfast.  . bisoprolol-hydrochlorothiazide (ZIAC) 10-6.25 MG tablet TAKE ONE TABLET BY MOUTH ONCE DAILY  . Blood Glucose Monitoring Suppl (FREESTYLE FREEDOM LITE) w/Device KIT 1 each by Does not apply route daily at 2 am. (Patient taking differently: 1 each by Does not apply route daily at 2 am. ACCU-CHECK GUIDE)  . finasteride (PROSCAR) 5 MG tablet Take 5 mg by mouth daily.   . Lancets MISC Test blood sugar daily. Dx code: 250.00  . lisinopril (ZESTRIL) 2.5 MG tablet TAKE ONE TABLET BY MOUTH ONCE DAILY  . loratadine (CLARITIN) 10 MG tablet Take 10 mg by mouth daily as needed.   . Misc Natural Products (OSTEO BI-FLEX TRIPLE STRENGTH) TABS Take by mouth.  . Multiple Vitamin (MULTIVITAMIN) capsule Take 1 capsule by mouth daily.  . nitroGLYCERIN (NITROSTAT) 0.4 MG SL tablet If you need to use this medication please inform your cardiologist and Dr. Daub.  . silodosin (RAPAFLO) 8 MG CAPS capsule Take 8 mg by mouth at bedtime.   . simvastatin (ZOCOR) 40 MG tablet TAKE 1/2 TABLET BY MOUTH EVERYDAY AT BEDTIME   No facility-administered encounter medications on file as of 02/10/2021.    Reviewed chart for medication changes and adherence.   No gaps in adherence identified. Patient has follow up scheduled with pharmacy team. No further action required.    Tracy Ellis,CPA [facility] [phone] 

## 2021-02-26 ENCOUNTER — Telehealth: Payer: Medicare Other

## 2021-03-02 ENCOUNTER — Telehealth: Payer: Self-pay | Admitting: Pharmacist

## 2021-03-06 NOTE — Progress Notes (Signed)
    Chronic Care Management Pharmacy Assistant   Name: [name]  MRN: [id number] DOB: 18-Mar-1946   Reason for Encounter: Medication Coordination Call    Recent office visits:  None ID  Recent consult visits:  None ID  Hospital visits:  None in previous 6 months  Medications: Outpatient Encounter Medications as of 03/02/2021  Medication Sig  . ACCU-CHEK GUIDE test strip USE TO check blood sugar DAILY AS DIRECTED  . Acetaminophen (TYLENOL ARTHRITIS PAIN PO) Take by mouth.  Marland Kitchen albuterol (VENTOLIN HFA) 108 (90 Base) MCG/ACT inhaler Inhale 2 puffs in the lungs every 4 hours as needed for cough, wheezing, or SOB.  Marland Kitchen alfuzosin (UROXATRAL) 10 MG 24 hr tablet Take 10 mg by mouth daily with breakfast.  . bisoprolol-hydrochlorothiazide (ZIAC) 10-6.25 MG tablet TAKE ONE TABLET BY MOUTH ONCE DAILY  . Blood Glucose Monitoring Suppl (FREESTYLE FREEDOM LITE) w/Device KIT 1 each by Does not apply route daily at 2 am. (Patient taking differently: 1 each by Does not apply route daily at 2 am. [place])  . finasteride (PROSCAR) 5 MG tablet Take 5 mg by mouth daily.   . Lancets MISC Test blood sugar daily. Dx code: 250.00  . lisinopril (ZESTRIL) 2.5 MG tablet TAKE ONE TABLET BY MOUTH ONCE DAILY  . loratadine (CLARITIN) 10 MG tablet Take 10 mg by mouth daily as needed.   . Misc Natural Products (OSTEO BI-FLEX TRIPLE STRENGTH) TABS Take by mouth.  . Multiple Vitamin (MULTIVITAMIN) capsule Take 1 capsule by mouth daily.  . nitroGLYCERIN (NITROSTAT) 0.4 MG SL tablet If you need to use this medication please inform your cardiologist and Dr. Everlene Farrier.  . silodosin (RAPAFLO) 8 MG CAPS capsule Take 8 mg by mouth at bedtime.   . simvastatin (ZOCOR) 40 MG tablet TAKE 1/2 TABLET BY MOUTH EVERYDAY AT BEDTIME   No facility-administered encounter medications on file as of 03/02/2021.    Reviewed chart for medication changes ahead of medication coordination call.  No OVs, Consults, or hospital visits  since last care coordination call/Pharmacist visit.  No medication changes indicated   BP Readings from Last 3 Encounters:  02/02/21 132/76  07/31/20 140/82  01/29/20 (!) 144/82    Lab Results  Component Value Date   HGBA1C 6.4 02/02/2021     Patient obtains medications through Vials  90 Days   Last adherence delivery included:  Accu-Check test strips  Albuterol Aer Hfa two puff every 4 hours PRN Bisoprolol-Hydrochlorothiazide 10-6.25mg  daily  Finasteride 5mg  daily Lisinopril 2.5mg  daily  Silodosin 8mg  daily Simvastatin 40mg  daily   Patient is due for next adherence delivery on: 03/09/21. Called patient and reviewed medications and coordinated delivery.  This delivery to include: Bisoprolol-Hydrochlorothiazide 10-6.25mg  daily    Patient declined the following medications states he has plenty on hand Finasteride 5mg  daily Lisinopril 2.5mg  daily  Silodosin 8mg  daily Simvastatin 40mg  daily Accu-Check test strips  Albuterol Aer Hfa two puff every 4 hours PRN    Confirmed delivery date of 03/09/21, advised patient that pharmacy will contact them the morning of delivery.  [place] Pharmacist Assistant [phone]  Time spent:40

## 2021-03-13 NOTE — Progress Notes (Signed)
Chronic Care Management Pharmacy Assistant   Name: [name]  MRN: [id number] DOB: 09/02/1946   Reason for Encounter: Chart Review   Medications: Outpatient Encounter Medications as of 03/02/2021  Medication Sig  . ACCU-CHEK GUIDE test strip USE TO check blood sugar DAILY AS DIRECTED  . Acetaminophen (TYLENOL ARTHRITIS PAIN PO) Take by mouth.  . albuterol (VENTOLIN HFA) 108 (90 Base) MCG/ACT inhaler Inhale 2 puffs in the lungs every 4 hours as needed for cough, wheezing, or SOB.  . alfuzosin (UROXATRAL) 10 MG 24 hr tablet Take 10 mg by mouth daily with breakfast.  . bisoprolol-hydrochlorothiazide (ZIAC) 10-6.25 MG tablet TAKE ONE TABLET BY MOUTH ONCE DAILY  . Blood Glucose Monitoring Suppl (FREESTYLE FREEDOM LITE) w/Device KIT 1 each by Does not apply route daily at 2 am. (Patient taking differently: 1 each by Does not apply route daily at 2 am. ACCU-CHECK GUIDE)  . finasteride (PROSCAR) 5 MG tablet Take 5 mg by mouth daily.   . Lancets MISC Test blood sugar daily. Dx code: 250.00  . lisinopril (ZESTRIL) 2.5 MG tablet TAKE ONE TABLET BY MOUTH ONCE DAILY  . loratadine (CLARITIN) 10 MG tablet Take 10 mg by mouth daily as needed.   . Misc Natural Products (OSTEO BI-FLEX TRIPLE STRENGTH) TABS Take by mouth.  . Multiple Vitamin (MULTIVITAMIN) capsule Take 1 capsule by mouth daily.  . nitroGLYCERIN (NITROSTAT) 0.4 MG SL tablet If you need to use this medication please inform your cardiologist and Dr. Daub.  . silodosin (RAPAFLO) 8 MG CAPS capsule Take 8 mg by mouth at bedtime.   . simvastatin (ZOCOR) 40 MG tablet TAKE 1/2 TABLET BY MOUTH EVERYDAY AT BEDTIME   No facility-administered encounter medications on file as of 03/02/2021.    Reviewed chart for medication changes and adherence.   No gaps in adherence identified. Patient has follow up scheduled with pharmacy team. No further action required.  Tracy Ellis,CMA Clinical Pharmacist Assistant [phone]  Time spent:4 

## 2021-03-19 ENCOUNTER — Other Ambulatory Visit: Payer: Self-pay

## 2021-03-19 ENCOUNTER — Ambulatory Visit (INDEPENDENT_AMBULATORY_CARE_PROVIDER_SITE_OTHER): Payer: Medicare Other | Admitting: Pharmacist

## 2021-03-19 ENCOUNTER — Telehealth: Payer: Self-pay | Admitting: Pharmacist

## 2021-03-19 DIAGNOSIS — E785 Hyperlipidemia, unspecified: Secondary | ICD-10-CM | POA: Diagnosis not present

## 2021-03-19 DIAGNOSIS — I10 Essential (primary) hypertension: Secondary | ICD-10-CM

## 2021-03-19 DIAGNOSIS — N401 Enlarged prostate with lower urinary tract symptoms: Secondary | ICD-10-CM

## 2021-03-19 DIAGNOSIS — R351 Nocturia: Secondary | ICD-10-CM | POA: Diagnosis not present

## 2021-03-19 MED ORDER — SIMVASTATIN 20 MG PO TABS: 20.0000 mg | ORAL_TABLET | Freq: Every day | ORAL | 0 refills | Status: DC

## 2021-03-19 NOTE — Progress Notes (Addendum)
    Chronic Care Management Pharmacy Assistant   Name: [name]  MRN: [id number] DOB: 11-25-1945    Reason for Encounter: Medication Coordination Call    Recent office visits:  None ID  Recent consult visits:  None ID  Hospital visits:  None in previous 6 months  Medications: Outpatient Encounter Medications as of 03/19/2021  Medication Sig   ACCU-CHEK GUIDE test strip USE TO check blood sugar DAILY AS DIRECTED   Acetaminophen (TYLENOL ARTHRITIS PAIN PO) Take by mouth.   albuterol (VENTOLIN HFA) 108 (90 Base) MCG/ACT inhaler Inhale 2 puffs in the lungs every 4 hours as needed for cough, wheezing, or SOB.   alfuzosin (UROXATRAL) 10 MG 24 hr tablet Take 10 mg by mouth daily with breakfast.   bisoprolol-hydrochlorothiazide (ZIAC) 10-6.25 MG tablet TAKE ONE TABLET BY MOUTH ONCE DAILY   Blood Glucose Monitoring Suppl (FREESTYLE FREEDOM LITE) w/Device KIT 1 each by Does not apply route daily at 2 am. (Patient taking differently: 1 each by Does not apply route daily at 2 am. ACCU-CHECK GUIDE)   finasteride (PROSCAR) 5 MG tablet Take 5 mg by mouth daily.    Lancets MISC Test blood sugar daily. Dx code: 250.00   lisinopril (ZESTRIL) 2.5 MG tablet TAKE ONE TABLET BY MOUTH ONCE DAILY   loratadine (CLARITIN) 10 MG tablet Take 10 mg by mouth daily as needed.    Misc Natural Products (OSTEO BI-FLEX TRIPLE STRENGTH) TABS Take by mouth.   Multiple Vitamin (MULTIVITAMIN) capsule Take 1 capsule by mouth daily.   nitroGLYCERIN (NITROSTAT) 0.4 MG SL tablet If you need to use this medication please inform your cardiologist and Dr. Everlene Farrier.   silodosin (RAPAFLO) 8 MG CAPS capsule Take 8 mg by mouth at bedtime.    simvastatin (ZOCOR) 20 MG tablet Take 1 tablet (20 mg total) by mouth at bedtime.   No facility-administered encounter medications on file as of 03/19/2021.     Received call from patient regarding medication management via [facility].  Patient requested an acute fill for  Simvastatin 20 mg and Lisinopril 2.5 mg to be delivered: 03/20/21 Pharmacy needs refills?No  Confirmed delivery date of 03/20/21, advised patient that pharmacy will contact them the morning of delivery.   [place] Pharmacist Assistant [phone]  Time spent:15

## 2021-03-19 NOTE — Progress Notes (Signed)
Chronic Care Management Pharmacy Note  03/20/2021 Name:  [name] MRN:  [id number] DOB:  Jun 02, 1946  Subjective: BRASEN [name] is an 75 y.o. year old male who is a primary patient of Burns, Claudina Lick, MD.  The CCM team was consulted for assistance with disease management and care coordination needs.    Engaged with patient by telephone for follow up visit in response to provider referral for pharmacy case management and/or care coordination services.   Consent to Services:  The patient was given information about Chronic Care Management services, agreed to services, and gave verbal consent prior to initiation of services.  Please see initial visit note for detailed documentation.   Patient Care Team: [name], MD as PCP - General (Internal Medicine) Charlton Haws, [facility] as Pharmacist (Pharmacist) Jola Schmidt, MD as Consulting Physician (Ophthalmology)  Recent office visits: 02/02/21 Dr Quay Burow OV: chronic f/u; GFR slightly decreased, advised lifestyle modifications. A1c prediabetic. F/U 6 months  Recent consult visits: 09/08/20 Dr Milford Cage (urology): f/u [facility] visits: None in previous 6 months  Objective:  Lab Results  Component Value Date   CREATININE 1.26 02/02/2021   BUN 14 02/02/2021   GFR 55.35 (L) 02/02/2021   GFRNONAA 54 (L) 07/31/2020   GFRAA 63 07/31/2020   NA 140 02/02/2021   K 4.0 02/02/2021   CALCIUM 9.4 02/02/2021   CO2 28 02/02/2021   GLUCOSE 109 (H) 02/02/2021    Lab Results  Component Value Date/Time   HGBA1C 6.4 02/02/2021 09:06 AM   HGBA1C 6.0 (H) 07/31/2020 10:01 AM   GFR 55.35 (L) 02/02/2021 09:06 AM   GFR 66.78 01/29/2020 09:16 AM   MICROALBUR 0.3 11/25/2015 01:40 PM   MICROALBUR 0.3 09/13/2014 01:20 PM    Last diabetic Eye exam:  Lab Results  Component Value Date/Time   HMDIABEYEEXA No Retinopathy 04/29/2020 12:00 AM    Last diabetic Foot exam: No results found for: HMDIABFOOTEX   Lab Results  Component  Value Date   CHOL 117 02/02/2021   HDL 38.50 (L) 02/02/2021   LDLCALC 57 02/02/2021   TRIG 109.0 02/02/2021   CHOLHDL 3 02/02/2021    Hepatic Function Latest Ref Rng & Units 02/02/2021 07/31/2020 01/29/2020  Total Protein 6.0 - 8.3 g/dL 6.9 6.5 7.3  Albumin 3.5 - 5.2 g/dL 4.5 - 4.2  AST 0 - 37 U/L 21 19 22   ALT 0 - 53 U/L 18 16 21   Alk Phosphatase 39 - 117 U/L 65 - 78  Total Bilirubin 0.2 - 1.2 mg/dL 0.6 0.6 0.6  Bilirubin, Direct 0.0 - 0.3 mg/dL - - -    Lab Results  Component Value Date/Time   TSH 3.00 07/31/2019 09:54 AM   TSH 3.04 07/24/2018 09:17 AM    CBC Latest Ref Rng & Units 02/02/2021 07/31/2019 07/24/2018  WBC 4.0 - 10.5 K/uL 4.3 3.7(L) 3.8(L)  Hemoglobin 13.0 - 17.0 g/dL 14.6 14.6 14.1  Hematocrit 39.0 - 52.0 % 42.9 43.1 41.1  Platelets 150.0 - 400.0 K/uL 167.0 156.0 139.0(L)    No results found for: VD25OH  Clinical ASCVD: Yes  The ASCVD Risk score Mikey Bussing DC Jr., et al., 2013) failed to calculate for the following reasons:   The valid total cholesterol range is 130 to 320 mg/dL    Depression screen [facility] 2/9 08/19/2020 07/31/2019 01/19/2018  Decreased Interest 0 0 0  Down, Depressed, Hopeless 0 0 0  PHQ - 2 Score 0 0 0     Social History  Tobacco Use  Smoking Status Former Smoker  . Packs/day: 0.25  . Years: 16.00  . Pack years: 4.00  . Types: Cigarettes  . Start date: 06/25/1957  . Quit date: 06/25/1973  . Years since quitting: 47.7  Smokeless Tobacco Never Used   BP Readings from Last 3 Encounters:  02/02/21 132/76  07/31/20 140/82  01/29/20 (!) 144/82   Pulse Readings from Last 3 Encounters:  02/02/21 60  07/31/20 (!) 50  01/29/20 (!) 54   Wt Readings from Last 3 Encounters:  02/02/21 249 lb (112.9 kg)  07/31/20 242 lb 3.2 oz (109.9 kg)  01/29/20 252 lb (114.3 kg)   BMI Readings from Last 3 Encounters:  02/02/21 37.86 kg/m  07/31/20 36.83 kg/m  01/29/20 38.32 kg/m    Assessment/Interventions: Review of patient past medical history,  allergies, medications, health status, including review of consultants reports, laboratory and other test data, was performed as part of comprehensive evaluation and provision of chronic care management services.   SDOH:  (Social Determinants of Health) assessments and interventions performed: Yes  SDOH Screenings   Alcohol Screen: Low Risk   . Last Alcohol Screening Score (AUDIT): 0  Depression (PHQ2-9): Low Risk   . PHQ-2 Score: 0  Financial Resource Strain: Low Risk   . Difficulty of Paying Living Expenses: Not very hard  Food Insecurity: No Food Insecurity  . Worried About Charity fundraiser in the Last Year: Never true  . Ran Out of Food in the Last Year: Never true  Housing: Low Risk   . Last Housing Risk Score: 0  Physical Activity: Inactive  . Days of Exercise per Week: 0 days  . Minutes of Exercise per Session: 0 min  Social Connections: Socially Integrated  . Frequency of Communication with Friends and Family: More than three times a week  . Frequency of Social Gatherings with Friends and Family: More than three times a week  . Attends Religious Services: More than 4 times per year  . Active Member of Clubs or Organizations: Yes  . Attends Archivist Meetings: More than 4 times per year  . Marital Status: Married  Stress: No Stress Concern Present  . Feeling of Stress : Not at all  Tobacco Use: Medium Risk  . Smoking Tobacco Use: Former Smoker  . Smokeless Tobacco Use: Never Used  Transportation Needs: No Transportation Needs  . Lack of Transportation (Medical): No  . Lack of Transportation (Non-Medical): No    CCM Care Plan  No Known Allergies  Medications Reviewed Today    Reviewed by Marcina Millard, CMA (Certified Medical Assistant) on 02/02/21 at [phone]  Med List Status: <None>  Medication Order Taking? Sig Documenting Provider Last Dose Status Informant  ACCU-CHEK GUIDE test strip [id number] Yes USE TO check blood sugar DAILY AS DIRECTED Burns, Claudina Lick, MD Taking Active   albuterol (VENTOLIN HFA) 108 (90 Base) MCG/ACT inhaler [id number] Yes Inhale 2 puffs in the lungs every 4 hours as needed for cough, wheezing, or SOB. [name], MD Taking Active   alfuzosin (UROXATRAL) 10 MG 24 hr tablet [id number] Yes Take 10 mg by mouth daily with breakfast. [provider] Taking Active   bisoprolol-hydrochlorothiazide [facility]) 10-6.25 MG tablet [id number] Yes TAKE ONE TABLET BY MOUTH ONCE DAILY Burns, Claudina Lick, MD Taking Active   Blood Glucose Monitoring Suppl (FREESTYLE FREEDOM LITE) w/Device KIT [id number] Yes 1 each by Does not apply route daily at 2 am.  Patient taking differently: 1 each  by Does not apply route daily at 2 am. [place], Stacy J, MD Taking Active   finasteride (PROSCAR) 5 MG tablet [id number] Yes Take 5 mg by mouth daily.  [provider] Taking Active   Lancets [place] [id number] Yes Test blood sugar daily. Dx code: 44.00 Collene Leyden, PA-C Taking Active   lisinopril (ZESTRIL) 2.5 MG tablet [id number] Yes TAKE ONE TABLET BY MOUTH ONCE DAILY Burns, Claudina Lick, MD Taking Active   loratadine (CLARITIN) 10 MG tablet [id number] Yes Take 10 mg by mouth daily as needed.  [provider] Taking Active   Misc Natural Products (OSTEO BI-FLEX TRIPLE STRENGTH) TABS [id number] Yes Take by mouth. [provider] Taking Active   Multiple Vitamin (MULTIVITAMIN) capsule [id number] Yes Take 1 capsule by mouth daily. [provider] Taking Active   nitroGLYCERIN (NITROSTAT) 0.4 MG SL tablet [id number] Yes If you need to use this medication please inform your cardiologist and Dr. Everlene Farrier. Tereasa Coop, PA-C Taking Active   silodosin (RAPAFLO) 8 MG CAPS capsule [id number] Yes Take 8 mg by mouth at bedtime.  [provider] Taking Active   simvastatin (ZOCOR) 40 MG tablet [id number] Yes TAKE 1/2 TABLET BY MOUTH EVERYDAY AT BEDTIME [name], MD Taking Active           Patient Active Problem  List   Diagnosis Date Noted  . Right arm pain 02/02/2021  . Erectile dysfunction 07/22/2017  . Neck pain 01/19/2017  . Asthma 07/22/2016  . Prediabetes 07/22/2016  . Abnormal EKG 11/21/2014  . Dyslipidemia 11/21/2014  . Metabolic syndrome 25/85/2778  . Morbid obesity ([place])   . Essential hypertension 10/27/2011  . Osteoarthritis 10/27/2011  . Atherogenic dyslipidemia; low HDL, in setting of metabolic syndrome 24/23/5361  . Renal cysts, acquired, bilateral 10/27/2011    Immunization History  Administered Date(s) Administered  . Fluad Quad(high Dose 65+) 07/31/2019, 07/31/2020  . Influenza Split 07/20/2011, 10/03/2012  . Influenza, High Dose Seasonal PF 07/22/2016, 07/22/2017, 07/24/2018  . Influenza,inj,Quad PF,6+ Mos 09/11/2013, 09/13/2014, 07/29/2015  . PFIZER(Purple Top)SARS-COV-2 Vaccination 01/06/2020, 01/30/2020  . Pneumococcal Conjugate-13 09/13/2014  . Pneumococcal Polysaccharide-23 11/15/1998, 09/07/2005, 01/19/2017  . Td 11/11/2003  . Tdap 03/25/2015    Conditions to be addressed/monitored:  Hypertension, Hyperlipidemia, Overactive Bladder and BPH  Care Plan : [place]  Updates made by Charlton Haws, [place] since 03/20/2021 12:00 AM    Problem: Hypertension, Hyperlipidemia, Overactive Bladder and BPH   Priority: High    Long-Range Goal: Disease management   Start Date: 03/20/2021  Expected End Date: 03/20/2022  This Visit's Progress: On track  Priority: High  Note:   Current Barriers:  . Unable to independently monitor therapeutic efficacy  Pharmacist Clinical Goal(s):  Marland Kitchen Patient will achieve adherence to monitoring guidelines and medication adherence to achieve therapeutic efficacy through collaboration with PharmD and provider.   Interventions: . 1:1 collaboration with [name], MD regarding development and update of comprehensive plan of care as evidenced by provider attestation and co-signature . Inter-disciplinary care team  collaboration (see longitudinal plan of care) . Comprehensive medication review performed; medication list updated in electronic medical record  Hypertension    BP goal is:  <130/80 Patient checks BP at home several times per month Patient home BP readings are ranging: 120/75 - 142/80   Patient has failed these meds in the past: n/a Patient is currently controlled on the following medications:   Lisinopril 2.5 mg daily   Bisoprolol-HCTZ 10-6.25  mg daily    We discussed BP goals; pt reports rare occurrences of elevated BP at home (to 140s/80s), mostly stays in 120/70s range.    Plan: Continue current medications    Hyperlipidemia    LDL goal < 100 Patient has failed these meds in past: n/a Patient is currently controlled on the following medications:   Simvastatin 40 mg - 1/2 tab daily     We discussed:  Cholesterol goals; HDL vs LDL; benefits of statin for ASCVD risk reduction; pt is interested in using 20 mg dose to avoid cutting pills in half which is time consuming and imprecise    Plan: Switch simvastatin to 20 mg tablets to avoid pill splitting   BPH / OAB   Follows with urology, Dr Milford Cage  Patient has failed these meds in past: n/a Patient is currently controlled on the following medications:   Finasteride 5 mg daily - not taking  Silodosin 8 mg daily PM - not taking consistently  Alfuzosin 10 mg daily - not taking   We discussed:  Pt was confused about which medications to take/what they are for. Discussed indications and benefits extensively; advised alfuzosin and silodosin will exhibit effects more quickly, finasteride takes several weeks-months to see improvement in symptoms  Pharmacist assistant contacted urology for alfuzosin refills and was informed this has not been prescribed since 2019. Advised patient to follow up with urologist.   Plan: Restart silodosin and finasteride and monitor urinary symptoms F/U with urologist in August (per patient)    Osteoarthritis    Patient has failed these meds in past: n/a Patient is currently controlled on the following medications:   BC powder (aspirin 845 mg)   We discussed:  Safety risks with daily use of BC powder (high dose aspirin); discussed Tylenol as safer alternative   Plan Recommend to stop BC powder and start Tylenol up to 3000 mg/day   Patient Goals/Self-Care Activities . Patient will:  - take medications as prescribed focus on medication adherence by routine  Follow Up Plan: Telephone follow up appointment with care management team member scheduled for: 3 months      Medication Assistance: None required.  Patient affirms current coverage meets needs.  Patient's preferred pharmacy is:  [facility] - [place], [place] - [address] [place] [address] Phone: [phone] Fax: [phone]  Uses pill box? No - prefers bottles Pt endorses 100% compliance  We discussed: Current pharmacy is preferred with insurance plan and patient is satisfied with pharmacy services Patient decided to: Utilize [facility] for medication synchronization, packaging and delivery  Care Plan and Follow Up Patient Decision:  Patient agrees to Care Plan and Follow-up.  Plan: Telephone follow up appointment with care management team member scheduled for:  3 months  Charlene Brooke, PharmD, [place], CPP Clinical Pharmacist [facility] at [facility] [phone]

## 2021-03-20 NOTE — Patient Instructions (Signed)
Visit Information  Phone number for Pharmacist: [phone]  Goals Addressed            This Visit's Progress   . Manage My Medicine       Timeframe:  Long-Range Goal Priority:  High Start Date:       03/20/21                      Expected End Date:    10/14/21                   Follow Up Date 07/14/21   - call for medicine refill 2 or 3 days before it runs out - call if I am sick and can't take my medicine - keep a list of all the medicines I take; vitamins and herbals too - use a pillbox to sort medicine  -Utilize [facility] for medication synchronization, packaging and delivery -Restart finasteride and silodosin for urinary/prostate issues. -Follow up with urologist as scheduled   Why is this important?   . These steps will help you keep on track with your medicines.   Notes:       Patient verbalizes understanding of instructions provided today and agrees to view in [place].  Telephone follow up appointment with pharmacy team member scheduled for: 3 months  Charlene Brooke, PharmD, [place], CPP Clinical Pharmacist [facility] at [facility] [phone]

## 2021-05-07 DIAGNOSIS — H5203 Hypermetropia, bilateral: Secondary | ICD-10-CM | POA: Diagnosis not present

## 2021-05-07 DIAGNOSIS — E119 Type 2 diabetes mellitus without complications: Secondary | ICD-10-CM | POA: Diagnosis not present

## 2021-05-07 LAB — HM DIABETES EYE EXAM

## 2021-05-13 ENCOUNTER — Encounter: Payer: Self-pay | Admitting: Internal Medicine

## 2021-06-08 ENCOUNTER — Other Ambulatory Visit: Payer: Self-pay | Admitting: Internal Medicine

## 2021-06-08 DIAGNOSIS — E785 Hyperlipidemia, unspecified: Secondary | ICD-10-CM

## 2021-06-18 ENCOUNTER — Telehealth: Payer: Medicare Other

## 2021-06-19 ENCOUNTER — Telehealth: Payer: Self-pay | Admitting: Pharmacist

## 2021-06-19 NOTE — Progress Notes (Signed)
    Chronic Care Management Pharmacy Assistant   Name: [name]  MRN: [id number] DOB: 05/15/1946   Reason for Encounter: Medication Adherence and Delivery Coordination    Recent office visits:  None since last CCM contact  Recent consult visits:  None since last CCM contact  Hospital visits:  None in previous 6 months  Medications: Outpatient Encounter Medications as of 06/19/2021  Medication Sig   ACCU-CHEK GUIDE test strip USE TO check blood sugar DAILY AS DIRECTED   Acetaminophen (TYLENOL ARTHRITIS PAIN PO) Take by mouth.   albuterol (VENTOLIN HFA) 108 (90 Base) MCG/ACT inhaler Inhale 2 puffs in the lungs every 4 hours as needed for cough, wheezing, or SOB.   alfuzosin (UROXATRAL) 10 MG 24 hr tablet Take 10 mg by mouth daily with breakfast.   bisoprolol-hydrochlorothiazide (ZIAC) 10-6.25 MG tablet TAKE ONE TABLET BY MOUTH ONCE DAILY   Blood Glucose Monitoring Suppl (FREESTYLE FREEDOM LITE) w/Device KIT 1 each by Does not apply route daily at 2 am. (Patient taking differently: 1 each by Does not apply route daily at 2 am. ACCU-CHECK GUIDE)   finasteride (PROSCAR) 5 MG tablet Take 5 mg by mouth daily.    Lancets MISC Test blood sugar daily. Dx code: 250.00   lisinopril (ZESTRIL) 2.5 MG tablet TAKE ONE TABLET BY MOUTH ONCE DAILY   loratadine (CLARITIN) 10 MG tablet Take 10 mg by mouth daily as needed.    Misc Natural Products (OSTEO BI-FLEX TRIPLE STRENGTH) TABS Take by mouth.   Multiple Vitamin (MULTIVITAMIN) capsule Take 1 capsule by mouth daily.   nitroGLYCERIN (NITROSTAT) 0.4 MG SL tablet If you need to use this medication please inform your cardiologist and Dr. Everlene Farrier.   silodosin (RAPAFLO) 8 MG CAPS capsule Take 8 mg by mouth at bedtime.    simvastatin (ZOCOR) 20 MG tablet TAKE ONE TABLET BY MOUTH EVERYDAY AT BEDTIME   No facility-administered encounter medications on file as of 06/19/2021.      BP Readings from Last 3 Encounters:  02/02/21 132/76  07/31/20 140/82   01/29/20 (!) 144/82    Lab Results  Component Value Date   HGBA1C 6.4 02/02/2021    Attempted to contact patient multiple times to review past due medications. Unable to reach patient. [facility] states they were able to reach patient and he denied needing any medications at this time.    Charlene Brooke, CPP notified  Margaretmary Dys, [place] Pharmacy Assistant [phone]

## 2021-06-24 ENCOUNTER — Telehealth: Payer: Medicare Other

## 2021-06-30 ENCOUNTER — Telehealth: Payer: Self-pay | Admitting: Pharmacist

## 2021-06-30 NOTE — Progress Notes (Signed)
    Chronic Care Management Pharmacy Assistant   Name: [name]  MRN: [id number] DOB: 1946/01/31  Reason for Encounter: Medication [place] Hospital visits:  None in previous 6 months  Medications: Outpatient Encounter Medications as of 06/30/2021  Medication Sig   ACCU-CHEK GUIDE test strip USE TO check blood sugar DAILY AS DIRECTED   Acetaminophen (TYLENOL ARTHRITIS PAIN PO) Take by mouth.   albuterol (VENTOLIN HFA) 108 (90 Base) MCG/ACT inhaler Inhale 2 puffs in the lungs every 4 hours as needed for cough, wheezing, or SOB.   alfuzosin (UROXATRAL) 10 MG 24 hr tablet Take 10 mg by mouth daily with breakfast.   bisoprolol-hydrochlorothiazide (ZIAC) 10-6.25 MG tablet TAKE ONE TABLET BY MOUTH ONCE DAILY   Blood Glucose Monitoring Suppl (FREESTYLE FREEDOM LITE) w/Device KIT 1 each by Does not apply route daily at 2 am. (Patient taking differently: 1 each by Does not apply route daily at 2 am. ACCU-CHECK GUIDE)   finasteride (PROSCAR) 5 MG tablet Take 5 mg by mouth daily.    Lancets MISC Test blood sugar daily. Dx code: 250.00   lisinopril (ZESTRIL) 2.5 MG tablet TAKE ONE TABLET BY MOUTH ONCE DAILY   loratadine (CLARITIN) 10 MG tablet Take 10 mg by mouth daily as needed.    Misc Natural Products (OSTEO BI-FLEX TRIPLE STRENGTH) TABS Take by mouth.   Multiple Vitamin (MULTIVITAMIN) capsule Take 1 capsule by mouth daily.   nitroGLYCERIN (NITROSTAT) 0.4 MG SL tablet If you need to use this medication please inform your cardiologist and Dr. Everlene Farrier.   silodosin (RAPAFLO) 8 MG CAPS capsule Take 8 mg by mouth at bedtime.    simvastatin (ZOCOR) 20 MG tablet TAKE ONE TABLET BY MOUTH EVERYDAY AT BEDTIME   No facility-administered encounter medications on file as of 06/30/2021.   Reviewed chart for medication changes ahead of medication coordination call.  No OVs, Consults, or hospital visits since last care coordination call/Pharmacist visit. (If appropriate, list visit date, provider  name)  No medication changes indicated OR if recent visit, treatment plan here.  BP Readings from Last 3 Encounters:  02/02/21 132/76  07/31/20 140/82  01/29/20 (!) 144/82    Lab Results  Component Value Date   HGBA1C 6.4 02/02/2021     Patient obtains medications through Vials  90 Days   Last adherence delivery included:  Lisinopril 2.5 mg 1 tab daily-last fill 03/20/21 90 ds Simvastatin 20 mg 1 tab daily-last fill 03/20/21 90 ds Bisoprolol-HCTZ 10-6.25 mg-last fill 03/06/21 9 ds   Patient is due for next adherence delivery on: 07/09/21.  Called patient and reviewed medications and coordinated delivery. This delivery to include: Lisinopril 2.5 mg 1 tab daily-last fill 03/20/21 90 ds Simvastatin 20 mg 1 tab daily-last fill 03/20/21 90 ds Bisoprolol-HCTZ 10-6.25 mg-last fill 03/06/21 9 ds    Patient declined the following medications, lisinopril,simvastatin, and bisoprolol-hctz. He states that he has plenty on hand from previous times and will contact the office when he is running low.  Patient needs refills for none noted.  Confirmed delivery date of 07/09/21, advised patient that pharmacy will contact them the morning of delivery.   Star Rating Drugs: Simvastatin 20 mg-last fill 03/20/21 90 ds Lisinopril 2.5 mg-last fill 03/20/21 90 ds  [place] Pharmacist Assistant [phone]   Time spent:29

## 2021-07-13 ENCOUNTER — Other Ambulatory Visit: Payer: Self-pay

## 2021-07-13 ENCOUNTER — Ambulatory Visit (INDEPENDENT_AMBULATORY_CARE_PROVIDER_SITE_OTHER): Payer: Medicare Other | Admitting: Pharmacist

## 2021-07-13 DIAGNOSIS — R351 Nocturia: Secondary | ICD-10-CM

## 2021-07-13 DIAGNOSIS — I10 Essential (primary) hypertension: Secondary | ICD-10-CM | POA: Diagnosis not present

## 2021-07-13 DIAGNOSIS — N401 Enlarged prostate with lower urinary tract symptoms: Secondary | ICD-10-CM | POA: Diagnosis not present

## 2021-07-13 DIAGNOSIS — E785 Hyperlipidemia, unspecified: Secondary | ICD-10-CM | POA: Diagnosis not present

## 2021-07-13 NOTE — Progress Notes (Signed)
Chronic Care Management Pharmacy Note  07/13/2021 Name:  [name] MRN:  [id number] DOB:  [birth year]  Summary: -Pt endorses compliance with lisinopril, bisoprolol-HCTZ and simvastatin even though he is overdue for refills per dispense history. Pt reports he somehow had extra 90-day supply around and has another 30-40 pills remaining before he will need refills -Pt has not been taking prostate medications for several months; he has appt to f/u with urology in next few weeks  Recommendations/Changes made from today's visit: -Counseled on daily compliance with medications   Subjective: [name] is an 75 y.o. year old male who is a primary patient of Burns, Claudina Lick, MD.  The CCM team was consulted for assistance with disease management and care coordination needs.    Engaged with patient by telephone for follow up visit in response to provider referral for pharmacy case management and/or care coordination services.   Consent to Services:  The patient was given information about Chronic Care Management services, agreed to services, and gave verbal consent prior to initiation of services.  Please see initial visit note for detailed documentation.   Patient Care Team: [name], MD as PCP - General (Internal Medicine) Charlton Haws, [facility] as Pharmacist (Pharmacist) Jola Schmidt, MD as Consulting Physician (Ophthalmology)  Recent office visits: 02/02/21 Dr Quay Burow OV: chronic f/u; GFR slightly decreased, advised lifestyle modifications. A1c prediabetic. F/U 6 months  Recent consult visits: 09/08/20 Dr Milford Cage (urology): f/u [facility] visits: None in previous 6 months  Objective:  Lab Results  Component Value Date   CREATININE 1.26 02/02/2021   BUN 14 02/02/2021   GFR 55.35 (L) 02/02/2021   GFRNONAA 54 (L) 07/31/2020   GFRAA 63 07/31/2020   NA 140 02/02/2021   K 4.0 02/02/2021   CALCIUM 9.4 02/02/2021   CO2 28 02/02/2021   GLUCOSE 109 (H)  02/02/2021    Lab Results  Component Value Date/Time   HGBA1C 6.4 02/02/2021 09:06 AM   HGBA1C 6.0 (H) 07/31/2020 10:01 AM   GFR 55.35 (L) 02/02/2021 09:06 AM   GFR 66.78 01/29/2020 09:16 AM   MICROALBUR 0.3 11/25/2015 01:40 PM   MICROALBUR 0.3 09/13/2014 01:20 PM    Last diabetic Eye exam:  Lab Results  Component Value Date/Time   HMDIABEYEEXA No Retinopathy 05/07/2021 12:00 AM    Last diabetic Foot exam: No results found for: HMDIABFOOTEX   Lab Results  Component Value Date   CHOL 117 02/02/2021   HDL 38.50 (L) 02/02/2021   LDLCALC 57 02/02/2021   TRIG 109.0 02/02/2021   CHOLHDL 3 02/02/2021    Hepatic Function Latest Ref Rng & Units 02/02/2021 07/31/2020 01/29/2020  Total Protein 6.0 - 8.3 g/dL 6.9 6.5 7.3  Albumin 3.5 - 5.2 g/dL 4.5 - 4.2  AST 0 - 37 U/L _0 ALT 0 - 53 U/L _1 Alk Phosphatase 39 - 117 U/L 65 - 78  Total Bilirubin 0.2 - 1.2 mg/dL 0.6 0.6 0.6  Bilirubin, Direct 0.0 - 0.3 mg/dL - - -    Lab Results  Component Value Date/Time   TSH 3.00 07/31/2019 09:54 AM   TSH 3.04 07/24/2018 09:17 AM    CBC Latest Ref Rng & Units 02/02/2021 07/31/2019 07/24/2018  WBC 4.0 - 10.5 K/uL 4.3 3.7(L) 3.8(L)  Hemoglobin 13.0 - 17.0 g/dL 14.6 14.6 14.1  Hematocrit 39.0 - 52.0 % 42.9 43.1 41.1  Platelets 150.0 - 400.0 K/uL 167.0 156.0 139.0(L)    No results  found for: VD25OH  Clinical ASCVD: Yes  The ASCVD Risk score Mikey Bussing DC Jr., et al., 2013) failed to calculate for the following reasons:   The valid total cholesterol range is 130 to 320 mg/dL    Depression screen [facility] 2/9 08/19/2020 07/31/2019 01/19/2018  Decreased Interest 0 0 0  Down, Depressed, Hopeless 0 0 0  PHQ - 2 Score 0 0 0     Social History   Tobacco Use  Smoking Status Former   Packs/day: 0.25   Years: 16.00   Pack years: 4.00   Types: Cigarettes   Start date: 06/25/1957   Quit date: 06/25/1973   Years since quitting: 48.0  Smokeless Tobacco Never   BP Readings from Last 3 Encounters:   02/02/21 132/76  07/31/20 140/82  01/29/20 (!) 144/82   Pulse Readings from Last 3 Encounters:  02/02/21 60  07/31/20 (!) 50  01/29/20 (!) 54   Wt Readings from Last 3 Encounters:  02/02/21 249 lb (112.9 kg)  07/31/20 242 lb 3.2 oz (109.9 kg)  01/29/20 252 lb (114.3 kg)   BMI Readings from Last 3 Encounters:  02/02/21 37.86 kg/m  07/31/20 36.83 kg/m  01/29/20 38.32 kg/m    Assessment/Interventions: Review of patient past medical history, allergies, medications, health status, including review of consultants reports, laboratory and other test data, was performed as part of comprehensive evaluation and provision of chronic care management services.   SDOH:  (Social Determinants of Health) assessments and interventions performed: Yes  SDOH Screenings   Alcohol Screen: Low Risk    Last Alcohol Screening Score (AUDIT): 0  Depression (PHQ2-9): Low Risk    PHQ-2 Score: 0  Financial Resource Strain: Not on file  Food Insecurity: No Food Insecurity   Worried About Charity fundraiser in the Last Year: Never true   Ran Out of Food in the Last Year: Never true  Housing: [place] Risk Score: 0  Physical Activity: Inactive   Days of Exercise per Week: 0 days   Minutes of Exercise per Session: 0 min  Social Connections: Engineer, building services of Communication with Friends and Family: More than three times a week   Frequency of Social Gatherings with Friends and Family: More than three times a week   Attends Religious Services: More than 4 times per year   Active Member of [employer] or Organizations: Yes   Attends Music therapist: More than 4 times per year   Marital Status: Married  Stress: No Stress Concern Present   Feeling of Stress : Not at all  Tobacco Use: Medium Risk   Smoking Tobacco Use: Former   Smokeless Tobacco Use: Never  Transportation Needs: No Data processing manager (Medical): No   Lack of  Transportation (Non-Medical): No    CCM Care Plan  No Known Allergies  Medications Reviewed Today     Reviewed by Charlton Haws, [facility] (Pharmacist) on 07/13/21 at 1056  Med List Status: <None>   Medication Order Taking? Sig Documenting Provider Last Dose Status Informant  ACCU-CHEK GUIDE test strip [id number] Yes USE TO check blood sugar DAILY AS DIRECTED Quay Burow, Claudina Lick, MD Taking Active   Acetaminophen (TYLENOL ARTHRITIS PAIN PO) [id number] Yes Take by mouth. [provider] Taking Active   albuterol (VENTOLIN HFA) 108 (90 Base) MCG/ACT inhaler [id number] Yes Inhale 2 puffs in the lungs every 4 hours as needed for cough, wheezing, or SOB. [name],  MD Taking Active   bisoprolol-hydrochlorothiazide [facility]) 10-6.25 MG tablet [id number] Yes TAKE ONE TABLET BY MOUTH ONCE DAILY Burns, Claudina Lick, MD Taking Active   Blood Glucose Monitoring Suppl (FREESTYLE FREEDOM LITE) w/Device KIT [id number] Yes 1 each by Does not apply route daily at 2 am.  Patient taking differently: 1 each by Does not apply route daily at 2 am. ACCU-CHECK GUIDE   [name], MD Taking Active   Lancets [place] [id number] No Test blood sugar daily. Dx code: 50.00  Patient not taking: Reported on 07/13/2021   Collene Leyden, PA-C Not Taking Consider Medication Status and Discontinue   lisinopril (ZESTRIL) 2.5 MG tablet [id number] Yes TAKE ONE TABLET BY MOUTH ONCE DAILY Burns, Claudina Lick, MD Taking Active   loratadine (CLARITIN) 10 MG tablet [id number] Yes Take 10 mg by mouth daily as needed.  [provider] Taking Active   Misc Natural Products (OSTEO BI-FLEX TRIPLE STRENGTH) TABS [id number] Yes Take by mouth. [provider] Taking Active   Multiple Vitamin (MULTIVITAMIN) capsule [id number] Yes Take 1 capsule by mouth daily. [provider] Taking Active   nitroGLYCERIN (NITROSTAT) 0.4 MG SL tablet [id number] Yes If you need to use this medication please inform your cardiologist and Dr. Everlene Farrier.  Tereasa Coop, PA-C Taking Active   simvastatin (ZOCOR) 20 MG tablet [id number] Yes TAKE ONE TABLET BY MOUTH EVERYDAY AT BEDTIME [name], MD Taking Active             Patient Active Problem List   Diagnosis Date Noted   Right arm pain 02/02/2021   Erectile dysfunction 07/22/2017   Neck pain 01/19/2017   Asthma 07/22/2016   Prediabetes 07/22/2016   Abnormal EKG 11/21/2014   Dyslipidemia 65/99/3570   Metabolic syndrome 17/79/3903   Morbid obesity ([place])    Essential hypertension 10/27/2011   Osteoarthritis 10/27/2011   Atherogenic dyslipidemia; low HDL, in setting of metabolic syndrome 00/92/3300   Renal cysts, acquired, bilateral 10/27/2011    Immunization History  Administered Date(s) Administered   Fluad Quad(high Dose 65+) 07/31/2019, 07/31/2020   Influenza Split 07/20/2011, 10/03/2012   Influenza, High Dose Seasonal PF 07/22/2016, 07/22/2017, 07/24/2018   Influenza,inj,Quad PF,6+ Mos 09/11/2013, 09/13/2014, 07/29/2015   PFIZER(Purple Top)SARS-COV-2 Vaccination 01/06/2020, 01/30/2020   Pneumococcal Conjugate-13 09/13/2014   Pneumococcal Polysaccharide-23 11/15/1998, 09/07/2005, 01/19/2017   Td 11/11/2003   Tdap 03/25/2015    Conditions to be addressed/monitored:  Hypertension, Hyperlipidemia, Overactive Bladder and BPH  Care Plan : [place]  Updates made by Charlton Haws, [place] since 07/13/2021 12:00 AM     Problem: Hypertension, Hyperlipidemia, Overactive Bladder and BPH   Priority: High     Long-Range Goal: Disease management   Start Date: 03/20/2021  Expected End Date: 03/20/2022  This Visit's Progress: On track  Recent Progress: On track  Priority: High  Note:   Current Barriers:  Unable to independently monitor therapeutic efficacy  Pharmacist Clinical Goal(s):  Patient will achieve adherence to monitoring guidelines and medication adherence to achieve therapeutic efficacy through collaboration with PharmD and provider.    Interventions: 1:1 collaboration with [name], MD regarding development and update of comprehensive plan of care as evidenced by provider attestation and co-signature Inter-disciplinary care team collaboration (see longitudinal plan of care) Comprehensive medication review performed; medication list updated in electronic medical record  Hypertension    BP goal is:  <130/80 Patient checks BP at home several times per month Patient home BP readings are ranging: 120/75 - 142/80  Patient has failed these meds in the past: n/a Patient is currently controlled on the following medications:  Lisinopril 2.5 mg daily  Bisoprolol-HCTZ 10-6.25 mg daily    We discussed BP goals; importance of compliance; per dispense history compliance is questionable but pt is adamant that he does not miss doses and had extra supply from a long time ago   Plan: Continue current medications    Hyperlipidemia    LDL goal < 100 Patient has failed these meds in past: n/a Patient is currently controlled on the following medications:  Simvastatin 20 mg daily   We discussed:  Cholesterol goals; pt reports he just started the 20 mg dose (changed to 20 mg in May 2022 to avoid pill splitting); counseled on importance of compliance   Plan: Recommend to continue current medication  BPH / OAB   Follows with urology, Dr Milford Cage  Patient has failed these meds in past: n/a Patient is currently controlled on the following medications:  Finasteride 5 mg daily - not taking Silodosin 8 mg daily PM - not taking consistently Alfuzosin 10 mg daily - not taking   We discussed:  pt has not been taking BPH medications since the rx ran out of refills; he reports he just made an appt with urology to determine treatment   Plan: Follow up with urology  Patient Goals/Self-Care Activities Patient will:  - take medications as prescribed -focus on medication adherence by routine -follow up with urology as scheduled        Medication Assistance: None required.  Patient affirms current coverage meets needs.  Compliance/Adherence/Medication fill history: Care Gaps: Vaccines - Shingrix, covid booster  Star-Rating Drugs: Simvastatin 20 mg-last fill 03/20/21 90 ds Lisinopril 2.5 mg-last fill 03/20/21 90 ds  Patient's preferred pharmacy is:  [facility] - [place], [place] - [address] [place] [address] Phone: [phone] Fax: [phone]  Uses pill box? No - prefers bottles Pt endorses 100% compliance  We discussed: Current pharmacy is preferred with insurance plan and patient is satisfied with pharmacy services Patient decided to: Utilize [facility] pharmacy for medication synchronization, packaging and delivery  Care Plan and Follow Up Patient Decision:  Patient agrees to Care Plan and Follow-up.  Plan: Telephone follow up appointment with care management team member scheduled for:  3 months  Charlene Brooke, PharmD, [place], CPP Clinical Pharmacist [facility] at [facility] [phone]

## 2021-07-13 NOTE — Patient Instructions (Addendum)
Visit Information  Phone number for Pharmacist: [phone]   Goals Addressed             This Visit's Progress    Manage My Medicine       Timeframe:  Long-Range Goal Priority:  High Start Date:       03/20/21                      Expected End Date:    07/13/22                Follow Up Date Feb 2023   - call for medicine refill 2 or 3 days before it runs out - call if I am sick and can't take my medicine - keep a list of all the medicines I take; vitamins and herbals too - use a pillbox to sort medicine  -Utilize [facility] pharmacy for medication synchronization, packaging and delivery -Follow up with urologist as scheduled   Why is this important?   These steps will help you keep on track with your medicines.   Notes:         Care Plan : [place]  Updates made by Charlton Haws, RPH since 07/13/2021 12:00 AM     Problem: Hypertension, Hyperlipidemia, Overactive Bladder and BPH   Priority: High     Long-Range Goal: Disease management   Start Date: 03/20/2021  Expected End Date: 03/20/2022  This Visit's Progress: On track  Recent Progress: On track  Priority: High  Note:   Current Barriers:  Unable to independently monitor therapeutic efficacy  Pharmacist Clinical Goal(s):  Patient will achieve adherence to monitoring guidelines and medication adherence to achieve therapeutic efficacy through collaboration with PharmD and provider.   Interventions: 1:1 collaboration with [name], MD regarding development and update of comprehensive plan of care as evidenced by provider attestation and co-signature Inter-disciplinary care team collaboration (see longitudinal plan of care) Comprehensive medication review performed; medication list updated in electronic medical record  Hypertension    BP goal is:  <130/80 Patient checks BP at home several times per month Patient home BP readings are ranging: 120/75 - 142/80   Patient has failed these  meds in the past: n/a Patient is currently controlled on the following medications:  Lisinopril 2.5 mg daily  Bisoprolol-HCTZ 10-6.25 mg daily    We discussed BP goals; importance of compliance; per dispense history compliance is questionable but pt is adamant that he does not miss doses and had extra supply from a long time ago   Plan: Continue current medications    Hyperlipidemia    LDL goal < 100 Patient has failed these meds in past: n/a Patient is currently controlled on the following medications:  Simvastatin 20 mg daily   We discussed:  Cholesterol goals; pt reports he just started the 20 mg dose (changed to 20 mg in May 2022 to avoid pill splitting); counseled on importance of compliance   Plan: Recommend to continue current medication  BPH / OAB   Follows with urology, Dr Milford Cage  Patient has failed these meds in past: n/a Patient is currently controlled on the following medications:  Finasteride 5 mg daily - not taking Silodosin 8 mg daily PM - not taking consistently Alfuzosin 10 mg daily - not taking   We discussed:  pt has not been taking BPH medications since the rx ran out of refills; he reports he just made an appt with urology to determine treatment  Plan: Follow up with urology  Patient Goals/Self-Care Activities Patient will:  - take medications as prescribed -focus on medication adherence by routine -follow up with urology as scheduled      Patient verbalizes understanding of instructions provided today and agrees to view in [place].  Telephone follow up appointment with pharmacy team member scheduled for: 6 months  Charlene Brooke, PharmD, [place], CPP Clinical Pharmacist [place] at [facility] [phone]

## 2021-07-27 DIAGNOSIS — H43811 Vitreous degeneration, right eye: Secondary | ICD-10-CM | POA: Diagnosis not present
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
## 2021-08-04 NOTE — Patient Instructions (Addendum)
  Blood work was ordered.     Flu immunization administered today.     Medications changes include :   stop taking Bc powders    Please followup in 6 months

## 2021-08-05 ENCOUNTER — Ambulatory Visit (INDEPENDENT_AMBULATORY_CARE_PROVIDER_SITE_OTHER): Payer: Medicare Other | Admitting: Internal Medicine

## 2021-08-05 ENCOUNTER — Other Ambulatory Visit: Payer: Self-pay

## 2021-08-05 ENCOUNTER — Encounter: Payer: Self-pay | Admitting: Internal Medicine

## 2021-08-05 VITALS — BP 142/80 | HR 68 | Temp 98.4°F | Ht 68.0 in | Wt 242.0 lb

## 2021-08-05 DIAGNOSIS — J4520 Mild intermittent asthma, uncomplicated: Secondary | ICD-10-CM

## 2021-08-05 DIAGNOSIS — N1831 Chronic kidney disease, stage 3a: Secondary | ICD-10-CM | POA: Diagnosis not present

## 2021-08-05 DIAGNOSIS — R7303 Prediabetes: Secondary | ICD-10-CM

## 2021-08-05 DIAGNOSIS — Z23 Encounter for immunization: Secondary | ICD-10-CM

## 2021-08-05 DIAGNOSIS — E785 Hyperlipidemia, unspecified: Secondary | ICD-10-CM

## 2021-08-05 DIAGNOSIS — N183 Chronic kidney disease, stage 3 unspecified: Secondary | ICD-10-CM | POA: Insufficient documentation

## 2021-08-05 DIAGNOSIS — I10 Essential (primary) hypertension: Secondary | ICD-10-CM

## 2021-08-05 LAB — LIPID PANEL
Cholesterol: 103 mg/dL (ref 0–200)
HDL: 33.1 mg/dL — ABNORMAL LOW (ref >39.00)
LDL Cholesterol: 54 mg/dL (ref 0–99)
NonHDL: 69.55
Total CHOL/HDL Ratio: 3
Triglycerides: 76 mg/dL (ref 0.0–149.0)
VLDL: 15.2 mg/dL (ref 0.0–40.0)

## 2021-08-05 LAB — COMPREHENSIVE METABOLIC PANEL
ALT: 17 U/L (ref 0–53)
AST: 20 U/L (ref 0–37)
Albumin: 4.3 g/dL (ref 3.5–5.2)
Alkaline Phosphatase: 64 U/L (ref 39–117)
BUN: 15 mg/dL (ref 6–23)
CO2: 27 mEq/L (ref 19–32)
Calcium: 9.3 mg/dL (ref 8.4–10.5)
Chloride: 106 mEq/L (ref 96–112)
Creatinine, Ser: 1.3 mg/dL (ref 0.40–1.50)
GFR: 53.12 mL/min — ABNORMAL LOW (ref >60.00)
Glucose, Bld: 105 mg/dL — ABNORMAL HIGH (ref 70–99)
Potassium: 4.2 mEq/L (ref 3.5–5.1)
Sodium: 139 mEq/L (ref 135–145)
Total Bilirubin: 0.5 mg/dL (ref 0.2–1.2)
Total Protein: 7 g/dL (ref 6.0–8.3)

## 2021-08-05 LAB — HEMOGLOBIN A1C: Hgb A1c MFr Bld: 6.3 % (ref 4.6–6.5)

## 2021-08-05 MED ORDER — SIMVASTATIN 20 MG PO TABS: ORAL_TABLET | ORAL | 2 refills | Status: DC

## 2021-08-05 MED ORDER — BISOPROLOL-HYDROCHLOROTHIAZIDE 10-6.25 MG PO TABS: 1.0000 | ORAL_TABLET | Freq: Every day | ORAL | 3 refills | Status: DC

## 2021-08-05 MED ORDER — LISINOPRIL 2.5 MG PO TABS: ORAL_TABLET | ORAL | 3 refills | Status: DC

## 2021-08-05 NOTE — Assessment & Plan Note (Signed)
Chronic mild

## 2021-08-05 NOTE — Assessment & Plan Note (Signed)
Chronic Check lipid panel, CMP Continue simvastatin 20 mg daily

## 2021-08-05 NOTE — Assessment & Plan Note (Addendum)
Chronic Check a1c Low sugar / carb diet Stressed regular exercise Stressed weight loss Discussed how close he is to having diabetes  Lab Results  Component Value Date   HGBA1C 6.4 02/02/2021

## 2021-08-05 NOTE — Assessment & Plan Note (Signed)
Chronic Mild, intermittent Overall controlled Continue albuterol inhaler as needed

## 2021-08-05 NOTE — Assessment & Plan Note (Addendum)
Chronic BP at little high today, but fairly controlled - well controlled at home Continue ziac 10-6.25 mg qd, lisinopril 2.5 mg qd cmp  Stressed weight loss, healthy diet and exercise May need to consider increasing lisinopril if BP not controlled

## 2021-08-11 ENCOUNTER — Telehealth: Payer: Self-pay | Admitting: Pharmacist

## 2021-08-11 NOTE — Chronic Care Management (AMB) (Signed)
    Chronic Care Management Pharmacy Assistant   Name: [name]  MRN: [id number] DOB: 1946/01/08   Reason for Encounter: Medication Review    Recent office visits:  08/05/21 [name], MD (PCP) Essential hypertension, med change: stop taking BC powders; follow up in 6 months  Recent consult visits:  None ID  Hospital visits:  None in previous 6 months  Medications: Outpatient Encounter Medications as of 08/11/2021  Medication Sig   ACCU-CHEK GUIDE test strip USE TO check blood sugar DAILY AS DIRECTED   Acetaminophen (TYLENOL ARTHRITIS PAIN PO) Take by mouth.   albuterol (VENTOLIN HFA) 108 (90 Base) MCG/ACT inhaler Inhale 2 puffs in the lungs every 4 hours as needed for cough, wheezing, or SOB.   bisoprolol-hydrochlorothiazide (ZIAC) 10-6.25 MG tablet Take 1 tablet by mouth daily.   Blood Glucose Monitoring Suppl (FREESTYLE FREEDOM LITE) w/Device KIT 1 each by Does not apply route daily at 2 am. (Patient taking differently: 1 each by Does not apply route daily at 2 am. ACCU-CHECK GUIDE)   Lancets MISC Test blood sugar daily. Dx code: 250.00   lisinopril (ZESTRIL) 2.5 MG tablet TAKE ONE TABLET BY MOUTH ONCE DAILY   loratadine (CLARITIN) 10 MG tablet Take 10 mg by mouth daily as needed.    Misc Natural Products (OSTEO BI-FLEX TRIPLE STRENGTH) TABS Take by mouth.   Multiple Vitamin (MULTIVITAMIN) capsule Take 1 capsule by mouth daily.   nitroGLYCERIN (NITROSTAT) 0.4 MG SL tablet If you need to use this medication please inform your cardiologist and Dr. Everlene Farrier.   simvastatin (ZOCOR) 20 MG tablet TAKE ONE TABLET BY MOUTH EVERYDAY AT BEDTIME   No facility-administered encounter medications on file as of 08/11/2021.   Received call from patient regarding medication management via [facility].  Patient requested an acute fill for Lisinopril 2.5 mg, Simvastatin 20 mg, and Bisoprolol-hctz 10-6.25 mg to be delivered: 08/14/21  Pharmacy needs refills? No  Confirmed delivery  date of 08/14/21, advised patient that pharmacy will contact them the morning of delivery.  Ethelene Hal Clinical Pharmacist Assistant [phone]   Time spent:22  Wendy Poet

## 2021-08-18 DIAGNOSIS — H43811 Vitreous degeneration, right eye: Secondary | ICD-10-CM | POA: Diagnosis not present

## 2021-11-26 ENCOUNTER — Ambulatory Visit: Payer: Medicare Other

## 2021-11-26 ENCOUNTER — Other Ambulatory Visit: Payer: Self-pay

## 2021-11-30 ENCOUNTER — Ambulatory Visit (INDEPENDENT_AMBULATORY_CARE_PROVIDER_SITE_OTHER): Payer: Medicare Other

## 2021-11-30 ENCOUNTER — Other Ambulatory Visit: Payer: Self-pay

## 2021-11-30 DIAGNOSIS — Z0000 Encounter for general adult medical examination without abnormal findings: Secondary | ICD-10-CM | POA: Diagnosis not present
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
## 2021-12-07 ENCOUNTER — Telehealth: Payer: Self-pay

## 2021-12-07 DIAGNOSIS — J45909 Unspecified asthma, uncomplicated: Secondary | ICD-10-CM

## 2021-12-07 MED ORDER — ALBUTEROL SULFATE HFA 108 (90 BASE) MCG/ACT IN AERS: INHALATION_SPRAY | RESPIRATORY_TRACT | 11 refills | Status: DC

## 2021-12-07 NOTE — Telephone Encounter (Signed)
error 

## 2021-12-07 NOTE — Addendum Note (Signed)
Addended by: [name] on: 12/07/2021 03:12 PM   Modules accepted: Orders

## 2021-12-07 NOTE — Telephone Encounter (Signed)
Spoke with Glass blower/designer with [facility], patient's last colonoscopy was 06/27/2019 due every 5 years.  Contacted patient and advised.  While on phone, patient requested a refill of albuterol HFA inhaler to be sent to [facility].  Forwarded message to Dr. Quay Burow.

## 2021-12-28 ENCOUNTER — Telehealth: Payer: Medicare Other

## 2022-01-15 NOTE — Telephone Encounter (Signed)
NOTE NOT NEEDED ?

## 2022-02-03 ENCOUNTER — Encounter: Payer: Self-pay | Admitting: Internal Medicine

## 2022-02-03 NOTE — Patient Instructions (Addendum)
? ? ? ?  Blood work was ordered.   ? ? ?Medications changes include :   increase the lisinopril to 5 mg daily ? ? ?Your prescription(s) have been sent to your pharmacy.  ? ? ? ?Return in about 6 months (around 08/07/2022) for follow up. ? ?

## 2022-02-03 NOTE — Progress Notes (Signed)
? ? ? ? ?Subjective:  ? ? Patient ID: [name], male    DOB: 1946-04-07, 76 y.o.   MRN: [id number] ? ?This visit occurred during the SARS-CoV-2 public health emergency.  Safety protocols were in place, including screening questions prior to the visit, additional usage of staff PPE, and extensive cleaning of exam room while observing appropriate contact time as indicated for disinfecting solutions.   ? ? ?HPI ?Vinay is here for follow up of his chronic medical problems, including htn, hld, prediabetes, asthma, ckd ? ?He is not exercising regularly.   He is a caregiver for his wife.  He eats a lot of take out.  He drinks too much soda - ginger ale.  ? ?BP at home today 150/80 something ? ?Medications and allergies reviewed with patient and updated if appropriate. ? ?Current Outpatient Medications on File Prior to Visit  ?Medication Sig Dispense Refill  ? ACCU-CHEK GUIDE test strip USE TO check blood sugar DAILY AS DIRECTED 100 strip 3  ? Acetaminophen (TYLENOL ARTHRITIS PAIN PO) Take by mouth.    ? albuterol (VENTOLIN HFA) 108 (90 Base) MCG/ACT inhaler Inhale 2 puffs in the lungs every 4 hours as needed for cough, wheezing, or SOB. 6.7 g 11  ? bisoprolol-hydrochlorothiazide (ZIAC) 10-6.25 MG tablet Take 1 tablet by mouth daily. 90 tablet 3  ? Blood Glucose Monitoring Suppl (FREESTYLE FREEDOM LITE) w/Device KIT 1 each by Does not apply route daily at 2 am. (Patient taking differently: 1 each by Does not apply route daily at 2 am. ACCU-CHECK GUIDE) 1 kit 0  ? Lancets MISC Test blood sugar daily. Dx code: 250.00 100 each 3  ? lisinopril (ZESTRIL) 2.5 MG tablet TAKE ONE TABLET BY MOUTH ONCE DAILY 90 tablet 3  ? loratadine (CLARITIN) 10 MG tablet Take 10 mg by mouth daily as needed.     ? Misc Natural Products (OSTEO BI-FLEX TRIPLE STRENGTH) TABS Take by mouth.    ? Multiple Vitamin (MULTIVITAMIN) capsule Take 1 capsule by mouth daily.    ? nitroGLYCERIN (NITROSTAT) 0.4 MG SL tablet If you need to use this  medication please inform your cardiologist and Dr. Everlene Farrier. 10 tablet 0  ? simvastatin (ZOCOR) 20 MG tablet TAKE ONE TABLET BY MOUTH EVERYDAY AT BEDTIME 90 tablet 2  ? finasteride (PROSCAR) 5 MG tablet Take 5 mg by mouth daily.    ? silodosin (RAPAFLO) 8 MG CAPS capsule Take 8 mg by mouth daily.    ? ?No current facility-administered medications on file prior to visit.  ? ? ? ?Review of Systems  ?Constitutional:  Negative for fever.  ?Respiratory:  Negative for cough, shortness of breath and wheezing.   ?Cardiovascular:  Negative for chest pain, palpitations and leg swelling.  ?Neurological:  Positive for headaches (occ). Negative for light-headedness.  ? ?   ?Objective:  ? ?Vitals:  ? 02/04/22 0843  ?BP: (!) 150/80  ?Pulse: 60  ?Temp: 98.1 ?F (36.7 ?C)  ?SpO2: 96%  ? ?BP Readings from Last 3 Encounters:  ?02/04/22 (!) 150/80  ?08/05/21 (!) 142/80  ?02/02/21 132/76  ? ?Wt Readings from Last 3 Encounters:  ?02/04/22 233 lb (105.7 kg)  ?08/05/21 242 lb (109.8 kg)  ?02/02/21 249 lb (112.9 kg)  ? ?Body mass index is 35.43 kg/m?. ? ?  ?Physical Exam ?Constitutional:   ?   General: He is not in acute distress. ?   Appearance: Normal appearance. He is not ill-appearing.  ?HENT:  ?   Head: Normocephalic and  atraumatic.  ?Eyes:  ?   Conjunctiva/sclera: Conjunctivae normal.  ?Cardiovascular:  ?   Rate and Rhythm: Normal rate and regular rhythm.  ?   Heart sounds: Normal heart sounds. No murmur heard. ?Pulmonary:  ?   Effort: Pulmonary effort is normal. No respiratory distress.  ?   Breath sounds: Normal breath sounds. No wheezing or rales.  ?Musculoskeletal:  ?   Right lower leg: No edema.  ?   Left lower leg: No edema.  ?Skin: ?   General: Skin is warm and dry.  ?   Findings: No rash.  ?Neurological:  ?   Mental Status: He is alert. Mental status is at baseline.  ?Psychiatric:     ?   Mood and Affect: Mood normal.  ? ?   ? ?Lab Results  ?Component Value Date  ? WBC 4.3 02/02/2021  ? HGB 14.6 02/02/2021  ? HCT 42.9 02/02/2021  ?  PLT 167.0 02/02/2021  ? GLUCOSE 105 (H) 08/05/2021  ? CHOL 103 08/05/2021  ? TRIG 76.0 08/05/2021  ? HDL 33.10 (L) 08/05/2021  ? [place] 54 08/05/2021  ? ALT 17 08/05/2021  ? AST 20 08/05/2021  ? NA 139 08/05/2021  ? K 4.2 08/05/2021  ? CL 106 08/05/2021  ? CREATININE 1.30 08/05/2021  ? BUN 15 08/05/2021  ? CO2 27 08/05/2021  ? TSH 3.00 07/31/2019  ? PSA 1.09 07/22/2017  ? INR 0.9 11/06/2007  ? HGBA1C 6.3 08/05/2021  ? MICROALBUR 0.3 11/25/2015  ? ? ? ?Assessment & Plan:  ? ? ?See Problem List for Assessment and Plan of chronic medical problems.  ? ? ?

## 2022-02-04 ENCOUNTER — Encounter: Payer: Self-pay | Admitting: Internal Medicine

## 2022-02-04 ENCOUNTER — Other Ambulatory Visit: Payer: Self-pay

## 2022-02-04 ENCOUNTER — Ambulatory Visit (INDEPENDENT_AMBULATORY_CARE_PROVIDER_SITE_OTHER): Payer: Medicare Other | Admitting: Internal Medicine

## 2022-02-04 VITALS — BP 132/80 | HR 60 | Temp 98.1°F | Ht 68.0 in | Wt 233.0 lb

## 2022-02-04 DIAGNOSIS — R7303 Prediabetes: Secondary | ICD-10-CM | POA: Diagnosis not present

## 2022-02-04 DIAGNOSIS — E785 Hyperlipidemia, unspecified: Secondary | ICD-10-CM | POA: Diagnosis not present

## 2022-02-04 DIAGNOSIS — I10 Essential (primary) hypertension: Secondary | ICD-10-CM

## 2022-02-04 DIAGNOSIS — J4520 Mild intermittent asthma, uncomplicated: Secondary | ICD-10-CM

## 2022-02-04 DIAGNOSIS — N1831 Chronic kidney disease, stage 3a: Secondary | ICD-10-CM

## 2022-02-04 LAB — CBC WITH DIFFERENTIAL/PLATELET
Basophils Absolute: 0 10*3/uL (ref 0.0–0.1)
Basophils Relative: 0.7 % (ref 0.0–3.0)
Eosinophils Absolute: 0.2 10*3/uL (ref 0.0–0.7)
Eosinophils Relative: 4.6 % (ref 0.0–5.0)
HCT: 43.6 % (ref 39.0–52.0)
Hemoglobin: 14.8 g/dL (ref 13.0–17.0)
Lymphocytes Relative: 34.9 % (ref 12.0–46.0)
Lymphs Abs: 1.6 10*3/uL (ref 0.7–4.0)
MCHC: 33.9 g/dL (ref 30.0–36.0)
MCV: 86.8 fl (ref 78.0–100.0)
Monocytes Absolute: 0.5 10*3/uL (ref 0.1–1.0)
Monocytes Relative: 11.6 % (ref 3.0–12.0)
Neutro Abs: 2.3 10*3/uL (ref 1.4–7.7)
Neutrophils Relative %: 48.2 % (ref 43.0–77.0)
Platelets: 152 10*3/uL (ref 150.0–400.0)
RBC: 5.02 Mil/uL (ref 4.22–5.81)
RDW: 13.8 % (ref 11.5–15.5)
WBC: 4.7 10*3/uL (ref 4.0–10.5)

## 2022-02-04 LAB — LIPID PANEL
Cholesterol: 119 mg/dL (ref 0–200)
HDL: 40.2 mg/dL (ref >39.00)
LDL Cholesterol: 64 mg/dL (ref 0–99)
NonHDL: 79.22
Total CHOL/HDL Ratio: 3
Triglycerides: 77 mg/dL (ref 0.0–149.0)
VLDL: 15.4 mg/dL (ref 0.0–40.0)

## 2022-02-04 LAB — COMPREHENSIVE METABOLIC PANEL
ALT: 18 U/L (ref 0–53)
AST: 21 U/L (ref 0–37)
Albumin: 4.4 g/dL (ref 3.5–5.2)
Alkaline Phosphatase: 70 U/L (ref 39–117)
BUN: 16 mg/dL (ref 6–23)
CO2: 29 mEq/L (ref 19–32)
Calcium: 9.2 mg/dL (ref 8.4–10.5)
Chloride: 108 mEq/L (ref 96–112)
Creatinine, Ser: 1.22 mg/dL (ref 0.40–1.50)
GFR: 57.12 mL/min — ABNORMAL LOW (ref >60.00)
Glucose, Bld: 98 mg/dL (ref 70–99)
Potassium: 4.2 mEq/L (ref 3.5–5.1)
Sodium: 143 mEq/L (ref 135–145)
Total Bilirubin: 0.5 mg/dL (ref 0.2–1.2)
Total Protein: 6.9 g/dL (ref 6.0–8.3)

## 2022-02-04 LAB — HEMOGLOBIN A1C: Hgb A1c MFr Bld: 6.3 % (ref 4.6–6.5)

## 2022-02-04 MED ORDER — LISINOPRIL 5 MG PO TABS: 5.0000 mg | ORAL_TABLET | Freq: Every day | ORAL | 3 refills | Status: DC

## 2022-02-04 NOTE — Assessment & Plan Note (Signed)
Chronic Check a1c Low sugar / carb diet Stressed regular exercise  

## 2022-02-04 NOTE — Assessment & Plan Note (Signed)
Chronic Mild, intermittent Controlled Continue albuterol inhaler as needed 

## 2022-02-04 NOTE — Assessment & Plan Note (Addendum)
Chronic ?Mild ?CMP ?Stressed good BP controlled ?Advised decreasing sodium intake ?

## 2022-02-04 NOTE — Assessment & Plan Note (Addendum)
Chronic ?Blood pressure not ideally controlled ?CMP ?Continu, bisoprolol-HCT 10-6.25 mg daily ?Increase lisinopril to 5 mg daily ?Monitor BP at home ?Decrease sodium intake ?

## 2022-02-04 NOTE — Assessment & Plan Note (Signed)
Chronic °Regular exercise and healthy diet encouraged °Check lipid panel  °Continue simvastatin 20 mg daily °

## 2022-02-25 ENCOUNTER — Ambulatory Visit (INDEPENDENT_AMBULATORY_CARE_PROVIDER_SITE_OTHER): Payer: Medicare Other

## 2022-02-25 DIAGNOSIS — N401 Enlarged prostate with lower urinary tract symptoms: Secondary | ICD-10-CM

## 2022-02-25 DIAGNOSIS — I10 Essential (primary) hypertension: Secondary | ICD-10-CM

## 2022-02-25 DIAGNOSIS — N1831 Chronic kidney disease, stage 3a: Secondary | ICD-10-CM

## 2022-02-25 DIAGNOSIS — E785 Hyperlipidemia, unspecified: Secondary | ICD-10-CM

## 2022-02-25 NOTE — Patient Instructions (Signed)
Visit Information ? ?Following are the goals we discussed today:  ? ?Manage My Medicine  ? ?Timeframe:  Long-Range Goal ?Priority:  High ?Start Date:       03/20/21                      ?Expected End Date:    02/26/2023              ? ?Follow Up Date 08/2022 ?  ?- call for medicine refill 2 or 3 days before it runs out ?- call if I am sick and can't take my medicine ?- keep a list of all the medicines I take; vitamins and herbals too ?- use a pillbox to sort medicine  ? ?  ?Why is this important?   ?These steps will help you keep on track with your medicines. ? ?Plan: Telephone follow up appointment with care management team member scheduled for:  6 months ?The patient has been provided with contact information for the care management team and has been advised to call with any health related questions or concerns.  ? ?Tomasa Blase, PharmD ?Clinical Pharmacist, [place]  ? ?Please call the care guide team at [phone] if you need to cancel or reschedule your appointment.  ? ?Patient verbalizes understanding of instructions and care plan provided today and agrees to view in [facility]. Active MyChart status confirmed with patient.   ? ?

## 2022-02-25 NOTE — Progress Notes (Signed)
? ?Chronic Care Management ?Pharmacy Note ? ?02/25/2022 ?Name:  [name] MRN:  [id number] DOB:  10-Sep-1946 ? ?Summary: ?-Patient endorses compliance with current medications  ?-Notes that BP at home typically averaging ~120/80's - no issues since latest increase in lisinopril dose  ?-LDL at goal with last check  ?-Reports that he checks blood sugars on occasion averaging ~100 ?-Recently completed shingrix vaccine series - will update vaccine history  ? ?Recommendations/Changes made from today's visit: ?-Reviewed with patient importance of compliance to medications, recommending no change to medications at this time  ?-Advised for patient to monitor blood pressure 2-3 times weekly, to reach out to office should BP average >140/90 ? ? ?Subjective: ?[name] is an 76 y.o. year old male who is a primary patient of Burns, Claudina Lick, MD.  The CCM team was consulted for assistance with disease management and care coordination needs.   ? ?Engaged with patient by telephone for follow up visit in response to provider referral for pharmacy case management and/or care coordination services.  ? ?Consent to Services:  ?The patient was given information about Chronic Care Management services, agreed to services, and gave verbal consent prior to initiation of services.  Please see initial visit note for detailed documentation.  ? ?Patient Care Team: ?[name], MD as PCP - General (Internal Medicine) ?Jola Schmidt, MD as Consulting Physician (Ophthalmology) ?Tomasa Blase, [place] (Pharmacist) ? ?Recent office visits: ?02/04/2022 - Dr. Quay Burow - increase lisinopril to 61m daily  - f/u in 6 months  ? ?Recent consult visits: ?09/08/20 Dr NMilford Cage(urology): f/u BPH ? ?Hospital visits: ?None in previous 6 months ? ?Objective: ? ?Lab Results  ?Component Value Date  ? CREATININE 1.22 02/04/2022  ? BUN 16 02/04/2022  ? GFR 57.12 (L) 02/04/2022  ? GFRNONAA 54 (L) 07/31/2020  ? GFRAA 63 07/31/2020  ? NA 143 02/04/2022  ? K  4.2 02/04/2022  ? CALCIUM 9.2 02/04/2022  ? CO2 29 02/04/2022  ? GLUCOSE 98 02/04/2022  ? ? ?Lab Results  ?Component Value Date/Time  ? HGBA1C 6.3 02/04/2022 10:00 AM  ? HGBA1C 6.3 08/05/2021 09:05 AM  ? GFR 57.12 (L) 02/04/2022 10:00 AM  ? GFR 53.12 (L) 08/05/2021 09:05 AM  ? MICROALBUR 0.3 11/25/2015 01:40 PM  ? MICROALBUR 0.3 09/13/2014 01:20 PM  ?  ?Last diabetic Eye exam:  ?Lab Results  ?Component Value Date/Time  ? HMDIABEYEEXA No Retinopathy 05/07/2021 12:00 AM  ?  ?Last diabetic Foot exam: No results found for: HMDIABFOOTEX  ? ?Lab Results  ?Component Value Date  ? CHOL 119 02/04/2022  ? HDL 40.20 02/04/2022  ? LLa[place]64 02/04/2022  ? TRIG 77.0 02/04/2022  ? CHOLHDL 3 02/04/2022  ? ? ? ?  Latest Ref Rng & Units 02/04/2022  ? 10:00 AM 08/05/2021  ?  9:05 AM 02/02/2021  ?  9:06 AM  ?Hepatic Function  ?Total Protein 6.0 - 8.3 g/dL 6.9   7.0   6.9    ?Albumin 3.5 - 5.2 g/dL 4.4   4.3   4.5    ?AST 0 - 37 U/L _0 ?ALT 0 - 53 U/L _1 ?Alk Phosphatase 39 - 117 U/L 70   64   65    ?Total Bilirubin 0.2 - 1.2 mg/dL 0.5   0.5   0.6    ? ? ?Lab Results  ?Component Value Date/Time  ? TSH  3.00 07/31/2019 09:54 AM  ? TSH 3.04 07/24/2018 09:17 AM  ? ? ? ?  Latest Ref Rng & Units 02/04/2022  ? 10:00 AM 02/02/2021  ?  9:06 AM 07/31/2019  ?  9:54 AM  ?CBC  ?WBC 4.0 - 10.5 K/uL 4.7   4.3   3.7    ?Hemoglobin 13.0 - 17.0 g/dL 14.8   14.6   14.6    ?Hematocrit 39.0 - 52.0 % 43.6   42.9   43.1    ?Platelets 150.0 - 400.0 K/uL 152.0   167.0   156.0    ? ? ?No results found for: VD25OH ? ?Clinical ASCVD: Yes  ?The ASCVD Risk score (Arnett DK, et al., 2019) failed to calculate for the following reasons: ?  The valid total cholesterol range is 130 to 320 mg/dL   ? ? ?  02/04/2022  ?  8:52 AM 11/30/2021  ?  1:49 PM 08/19/2020  ?  2:43 PM  ?Depression screen PHQ 2/9  ?Decreased Interest 0 0 0  ?Down, Depressed, Hopeless 0 0 0  ?PHQ - 2 Score 0 0 0  ?  ? ?Social History  ? ?Tobacco Use  ?Smoking Status Former  ?  Packs/day: 0.25  ? Years: 16.00  ? Pack years: 4.00  ? Types: Cigarettes  ? Start date: 06/25/1957  ? Quit date: 06/25/1973  ? Years since quitting: 48.7  ?Smokeless Tobacco Never  ? ?BP Readings from Last 3 Encounters:  ?02/04/22 132/80  ?08/05/21 (!) 142/80  ?02/02/21 132/76  ? ?Pulse Readings from Last 3 Encounters:  ?02/04/22 60  ?08/05/21 68  ?02/02/21 60  ? ?Wt Readings from Last 3 Encounters:  ?02/04/22 233 lb (105.7 kg)  ?08/05/21 242 lb (109.8 kg)  ?02/02/21 249 lb (112.9 kg)  ? ?BMI Readings from Last 3 Encounters:  ?02/04/22 35.43 kg/m?  ?08/05/21 36.80 kg/m?  ?02/02/21 37.86 kg/m?  ? ? ?Assessment/Interventions: Review of patient past medical history, allergies, medications, health status, including review of consultants reports, laboratory and other test data, was performed as part of comprehensive evaluation and provision of chronic care management services.  ? ?SDOH:  (Social Determinants of Health) assessments and interventions performed: Yes ? ?SDOH Screenings  ? ?Alcohol Screen: Low Risk   ? Last Alcohol Screening Score (AUDIT): 0  ?Depression (PHQ2-9): Low Risk   ? PHQ-2 Score: 0  ?Financial Resource Strain: Low Risk   ? Difficulty of Paying Living Expenses: Not hard at all  ?Food Insecurity: No Food Insecurity  ? Worried About Charity fundraiser in the Last Year: Never true  ? Ran Out of Food in the Last Year: Never true  ?Housing: Low Risk   ? Last Housing Risk Score: 0  ?Physical Activity: Sufficiently Active  ? Days of Exercise per Week: 5 days  ? Minutes of Exercise per Session: 30 min  ?Social Connections: Socially Integrated  ? Frequency of Communication with Friends and Family: More than three times a week  ? Frequency of Social Gatherings with Friends and Family: More than three times a week  ? Attends Religious Services: More than 4 times per year  ? Active Member of Clubs or Organizations: Yes  ? Attends Archivist Meetings: More than 4 times per year  ? Marital Status: Married   ?Stress: No Stress Concern Present  ? Feeling of Stress : Not at all  ?Tobacco Use: Medium Risk  ? Smoking Tobacco Use: Former  ? Smokeless Tobacco Use: Never  ? Passive  Exposure: Not on file  ?Transportation Needs: No Transportation Needs  ? Lack of Transportation (Medical): No  ? Lack of Transportation (Non-Medical): No  ? ? ?CCM Care Plan ? ?No Known Allergies ? ?Medications Reviewed Today   ? ? Reviewed by Tomasa Blase, [facility] (Pharmacist) on 02/25/22 at 1511  Med List Status: <None>  ? ?Medication Order Taking? Sig Documenting Provider Last Dose Status Informant  ?ACCU-CHEK GUIDE test strip [id number] Yes USE TO check blood sugar DAILY AS DIRECTED Quay Burow, Claudina Lick, MD Taking Active   ?Acetaminophen (TYLENOL ARTHRITIS PAIN PO) [id number] Yes Take by mouth. [provider] Taking Active   ?albuterol (VENTOLIN HFA) 108 (90 Base) MCG/ACT inhaler [id number] Yes Inhale 2 puffs in the lungs every 4 hours as needed for cough, wheezing, or SOB. [name], MD Taking Active   ?bisoprolol-hydrochlorothiazide [facility]) 10-6.25 MG tablet [id number] Yes Take 1 tablet by mouth daily. [name], MD Taking Active   ?Blood Glucose Monitoring Suppl (FREESTYLE FREEDOM LITE) w/Device KIT [id number] Yes 1 each by Does not apply route daily at 2 am.  ?Patient taking differently: 1 each by Does not apply route daily at 2 am. [place]  ? [name], MD Taking Active   ?finasteride (PROSCAR) 5 MG tablet [id number] Yes Take 5 mg by mouth daily. [provider] Taking Active   ?Lancets MISC [id number] Yes Test blood sugar daily. Dx code: 49.00 Collene Leyden, PA-C Taking Active   ?lisinopril (ZESTRIL) 5 MG tablet [id number] Yes Take 1 tablet (5 mg total) by mouth daily. [name], MD Taking Active   ?loratadine (CLARITIN) 10 MG tablet [id number] Yes Take 10 mg by mouth daily as needed.  [provider] Taking Active   ?Misc Natural Products (OSTEO BI-FLEX TRIPLE STRENGTH) TABS [id number] Yes Take  by mouth. [provider] Taking Active   ?Multiple Vitamin (MULTIVITAMIN) capsule [id number] Yes Take 1 capsule by mouth daily. [provider] Taking Active   ?nitroGLYCERIN (NITROSTAT) 0.4 M

## 2022-03-14 DIAGNOSIS — N401 Enlarged prostate with lower urinary tract symptoms: Secondary | ICD-10-CM | POA: Diagnosis not present

## 2022-03-14 DIAGNOSIS — N3281 Overactive bladder: Secondary | ICD-10-CM

## 2022-03-14 DIAGNOSIS — I10 Essential (primary) hypertension: Secondary | ICD-10-CM | POA: Diagnosis not present

## 2022-03-14 DIAGNOSIS — E785 Hyperlipidemia, unspecified: Secondary | ICD-10-CM | POA: Diagnosis not present

## 2022-05-21 ENCOUNTER — Telehealth: Payer: Self-pay | Admitting: Internal Medicine

## 2022-05-21 ENCOUNTER — Ambulatory Visit (INDEPENDENT_AMBULATORY_CARE_PROVIDER_SITE_OTHER): Payer: Medicare Other | Admitting: Family Medicine

## 2022-05-21 ENCOUNTER — Emergency Department (HOSPITAL_COMMUNITY): Payer: Medicare Other

## 2022-05-21 ENCOUNTER — Encounter: Payer: Self-pay | Admitting: Family Medicine

## 2022-05-21 ENCOUNTER — Inpatient Hospital Stay (HOSPITAL_COMMUNITY)
Admission: EM | Admit: 2022-05-21 | Discharge: 2022-05-26 | DRG: 435 | Disposition: A | Discharge status: Home / Self Care | Payer: Medicare Other | Attending: Internal Medicine | Admitting: Internal Medicine

## 2022-05-21 ENCOUNTER — Other Ambulatory Visit: Payer: Self-pay

## 2022-05-21 ENCOUNTER — Encounter (HOSPITAL_COMMUNITY): Payer: Self-pay | Admitting: Emergency Medicine

## 2022-05-21 VITALS — BP 132/70 | HR 60 | Temp 97.8°F | Ht 68.0 in | Wt 226.0 lb

## 2022-05-21 DIAGNOSIS — K831 Obstruction of bile duct: Secondary | ICD-10-CM | POA: Diagnosis not present

## 2022-05-21 DIAGNOSIS — R7303 Prediabetes: Secondary | ICD-10-CM | POA: Diagnosis not present

## 2022-05-21 DIAGNOSIS — C259 Malignant neoplasm of pancreas, unspecified: Principal | ICD-10-CM | POA: Diagnosis present

## 2022-05-21 DIAGNOSIS — Z6834 Body mass index (BMI) 34.0-34.9, adult: Secondary | ICD-10-CM | POA: Diagnosis not present

## 2022-05-21 DIAGNOSIS — J90 Pleural effusion, not elsewhere classified: Secondary | ICD-10-CM | POA: Diagnosis not present

## 2022-05-21 DIAGNOSIS — R7401 Elevation of levels of liver transaminase levels: Secondary | ICD-10-CM | POA: Diagnosis present

## 2022-05-21 DIAGNOSIS — R945 Abnormal results of liver function studies: Secondary | ICD-10-CM | POA: Diagnosis not present

## 2022-05-21 DIAGNOSIS — E669 Obesity, unspecified: Secondary | ICD-10-CM | POA: Diagnosis present

## 2022-05-21 DIAGNOSIS — K219 Gastro-esophageal reflux disease without esophagitis: Secondary | ICD-10-CM | POA: Diagnosis not present

## 2022-05-21 DIAGNOSIS — M1990 Unspecified osteoarthritis, unspecified site: Secondary | ICD-10-CM | POA: Diagnosis not present

## 2022-05-21 DIAGNOSIS — E860 Dehydration: Secondary | ICD-10-CM | POA: Diagnosis not present

## 2022-05-21 DIAGNOSIS — N179 Acute kidney failure, unspecified: Secondary | ICD-10-CM | POA: Diagnosis not present

## 2022-05-21 DIAGNOSIS — R82998 Other abnormal findings in urine: Secondary | ICD-10-CM | POA: Insufficient documentation

## 2022-05-21 DIAGNOSIS — Z713 Dietary counseling and surveillance: Secondary | ICD-10-CM | POA: Diagnosis not present

## 2022-05-21 DIAGNOSIS — J439 Emphysema, unspecified: Secondary | ICD-10-CM | POA: Diagnosis not present

## 2022-05-21 DIAGNOSIS — N183 Chronic kidney disease, stage 3 unspecified: Secondary | ICD-10-CM | POA: Diagnosis present

## 2022-05-21 DIAGNOSIS — N1831 Chronic kidney disease, stage 3a: Secondary | ICD-10-CM | POA: Diagnosis not present

## 2022-05-21 DIAGNOSIS — K828 Other specified diseases of gallbladder: Secondary | ICD-10-CM | POA: Diagnosis not present

## 2022-05-21 DIAGNOSIS — E785 Hyperlipidemia, unspecified: Secondary | ICD-10-CM | POA: Diagnosis present

## 2022-05-21 DIAGNOSIS — K838 Other specified diseases of biliary tract: Secondary | ICD-10-CM | POA: Diagnosis not present

## 2022-05-21 DIAGNOSIS — D490 Neoplasm of unspecified behavior of digestive system: Secondary | ICD-10-CM | POA: Diagnosis not present

## 2022-05-21 DIAGNOSIS — R079 Chest pain, unspecified: Secondary | ICD-10-CM | POA: Diagnosis not present

## 2022-05-21 DIAGNOSIS — I129 Hypertensive chronic kidney disease with stage 1 through stage 4 chronic kidney disease, or unspecified chronic kidney disease: Secondary | ICD-10-CM | POA: Diagnosis present

## 2022-05-21 DIAGNOSIS — R7989 Other specified abnormal findings of blood chemistry: Secondary | ICD-10-CM | POA: Insufficient documentation

## 2022-05-21 DIAGNOSIS — N289 Disorder of kidney and ureter, unspecified: Secondary | ICD-10-CM | POA: Diagnosis not present

## 2022-05-21 DIAGNOSIS — J4520 Mild intermittent asthma, uncomplicated: Secondary | ICD-10-CM | POA: Diagnosis present

## 2022-05-21 DIAGNOSIS — R932 Abnormal findings on diagnostic imaging of liver and biliary tract: Secondary | ICD-10-CM | POA: Diagnosis not present

## 2022-05-21 DIAGNOSIS — N189 Chronic kidney disease, unspecified: Secondary | ICD-10-CM | POA: Diagnosis not present

## 2022-05-21 DIAGNOSIS — K8020 Calculus of gallbladder without cholecystitis without obstruction: Secondary | ICD-10-CM | POA: Diagnosis present

## 2022-05-21 DIAGNOSIS — R822 Biliuria: Secondary | ICD-10-CM | POA: Diagnosis not present

## 2022-05-21 DIAGNOSIS — I10 Essential (primary) hypertension: Secondary | ICD-10-CM | POA: Diagnosis present

## 2022-05-21 DIAGNOSIS — E1122 Type 2 diabetes mellitus with diabetic chronic kidney disease: Secondary | ICD-10-CM | POA: Diagnosis not present

## 2022-05-21 DIAGNOSIS — K810 Acute cholecystitis: Secondary | ICD-10-CM | POA: Clinically undetermined

## 2022-05-21 DIAGNOSIS — E1151 Type 2 diabetes mellitus with diabetic peripheral angiopathy without gangrene: Secondary | ICD-10-CM | POA: Diagnosis not present

## 2022-05-21 DIAGNOSIS — K8689 Other specified diseases of pancreas: Secondary | ICD-10-CM

## 2022-05-21 DIAGNOSIS — R17 Unspecified jaundice: Secondary | ICD-10-CM | POA: Diagnosis not present

## 2022-05-21 DIAGNOSIS — C250 Malignant neoplasm of head of pancreas: Secondary | ICD-10-CM | POA: Diagnosis not present

## 2022-05-21 DIAGNOSIS — Z87891 Personal history of nicotine dependence: Secondary | ICD-10-CM

## 2022-05-21 DIAGNOSIS — Z79899 Other long term (current) drug therapy: Secondary | ICD-10-CM | POA: Diagnosis not present

## 2022-05-21 DIAGNOSIS — Z8249 Family history of ischemic heart disease and other diseases of the circulatory system: Secondary | ICD-10-CM

## 2022-05-21 DIAGNOSIS — R935 Abnormal findings on diagnostic imaging of other abdominal regions, including retroperitoneum: Secondary | ICD-10-CM | POA: Diagnosis not present

## 2022-05-21 DIAGNOSIS — R748 Abnormal levels of other serum enzymes: Secondary | ICD-10-CM | POA: Diagnosis not present

## 2022-05-21 DIAGNOSIS — R109 Unspecified abdominal pain: Secondary | ICD-10-CM | POA: Diagnosis not present

## 2022-05-21 DIAGNOSIS — R001 Bradycardia, unspecified: Secondary | ICD-10-CM | POA: Diagnosis not present

## 2022-05-21 DIAGNOSIS — R1013 Epigastric pain: Secondary | ICD-10-CM | POA: Diagnosis not present

## 2022-05-21 DIAGNOSIS — R197 Diarrhea, unspecified: Secondary | ICD-10-CM | POA: Diagnosis not present

## 2022-05-21 DIAGNOSIS — R933 Abnormal findings on diagnostic imaging of other parts of digestive tract: Secondary | ICD-10-CM | POA: Diagnosis not present

## 2022-05-21 DIAGNOSIS — R0789 Other chest pain: Secondary | ICD-10-CM | POA: Diagnosis not present

## 2022-05-21 LAB — BASIC METABOLIC PANEL
Anion gap: 11 (ref 5–15)
BUN: 12 mg/dL (ref 8–23)
CO2: 20 mmol/L — ABNORMAL LOW (ref 22–32)
Calcium: 9.6 mg/dL (ref 8.9–10.3)
Chloride: 106 mmol/L (ref 98–111)
Creatinine, Ser: 1.39 mg/dL — ABNORMAL HIGH (ref 0.61–1.24)
GFR, Estimated: 52 mL/min — ABNORMAL LOW (ref >60)
Glucose, Bld: 134 mg/dL — ABNORMAL HIGH (ref 70–99)
Potassium: 3.8 mmol/L (ref 3.5–5.1)
Sodium: 137 mmol/L (ref 135–145)

## 2022-05-21 LAB — CBC
HCT: 43.5 % (ref 39.0–52.0)
Hemoglobin: 14.7 g/dL (ref 13.0–17.0)
MCH: 29.1 pg (ref 26.0–34.0)
MCHC: 33.8 g/dL (ref 30.0–36.0)
MCV: 86.1 fL (ref 80.0–100.0)
Platelets: 171 10*3/uL (ref 150–400)
RBC: 5.05 MIL/uL (ref 4.22–5.81)
RDW: 13.9 % (ref 11.5–15.5)
WBC: 5.2 10*3/uL (ref 4.0–10.5)
nRBC: 0 % (ref 0.0–0.2)

## 2022-05-21 LAB — CBC WITH DIFFERENTIAL/PLATELET
Basophils Absolute: 0 10*3/uL (ref 0.0–0.1)
Basophils Relative: 0.3 % (ref 0.0–3.0)
Eosinophils Absolute: 0 10*3/uL (ref 0.0–0.7)
Eosinophils Relative: 0.4 % (ref 0.0–5.0)
HCT: 45 % (ref 39.0–52.0)
Hemoglobin: 14.9 g/dL (ref 13.0–17.0)
Lymphocytes Relative: 7.6 % — ABNORMAL LOW (ref 12.0–46.0)
Lymphs Abs: 0.6 10*3/uL — ABNORMAL LOW (ref 0.7–4.0)
MCHC: 33.2 g/dL (ref 30.0–36.0)
MCV: 87.2 fl (ref 78.0–100.0)
Monocytes Absolute: 0.8 10*3/uL (ref 0.1–1.0)
Monocytes Relative: 10.4 % (ref 3.0–12.0)
Neutro Abs: 6.2 10*3/uL (ref 1.4–7.7)
Neutrophils Relative %: 81.3 % — ABNORMAL HIGH (ref 43.0–77.0)
Platelets: 157 10*3/uL (ref 150.0–400.0)
RBC: 5.16 Mil/uL (ref 4.22–5.81)
RDW: 14.8 % (ref 11.5–15.5)
WBC: 7.7 10*3/uL (ref 4.0–10.5)

## 2022-05-21 LAB — COMPREHENSIVE METABOLIC PANEL WITH GFR
ALT: 551 U/L — ABNORMAL HIGH (ref 0–53)
AST: 341 U/L — ABNORMAL HIGH (ref 0–37)
Albumin: 4.5 g/dL (ref 3.5–5.2)
Alkaline Phosphatase: 252 U/L — ABNORMAL HIGH (ref 39–117)
BUN: 14 mg/dL (ref 6–23)
CO2: 23 meq/L (ref 19–32)
Calcium: 9.6 mg/dL (ref 8.4–10.5)
Chloride: 104 meq/L (ref 96–112)
Creatinine, Ser: 1.32 mg/dL (ref 0.40–1.50)
GFR: 51.87 mL/min — ABNORMAL LOW
Glucose, Bld: 115 mg/dL — ABNORMAL HIGH (ref 70–99)
Potassium: 4.1 meq/L (ref 3.5–5.1)
Sodium: 137 meq/L (ref 135–145)
Total Bilirubin: 5.4 mg/dL — ABNORMAL HIGH (ref 0.2–1.2)
Total Protein: 7 g/dL (ref 6.0–8.3)

## 2022-05-21 LAB — TROPONIN I (HIGH SENSITIVITY): Troponin I (High Sensitivity): 10 ng/L (ref <18)

## 2022-05-21 LAB — LIPASE: Lipase: 35 U/L (ref 11.0–59.0)

## 2022-05-21 LAB — POCT URINALYSIS DIPSTICK
Bilirubin, UA: POSITIVE
Blood, UA: NEGATIVE
Glucose, UA: POSITIVE — AB
Ketones, UA: NEGATIVE
Nitrite, UA: NEGATIVE
Protein, UA: POSITIVE — AB
Spec Grav, UA: >=1.03 — AB (ref 1.010–1.025)
Urobilinogen, UA: 1 E.U./dL
pH, UA: 5.5 (ref 5.0–8.0)

## 2022-05-21 MED ORDER — PANTOPRAZOLE SODIUM 40 MG PO TBEC: 40.0000 mg | DELAYED_RELEASE_TABLET | Freq: Every day | ORAL | 1 refills | Status: DC

## 2022-05-21 NOTE — Assessment & Plan Note (Signed)
Check liver function. Send urine for culture due to leuk 1+

## 2022-05-21 NOTE — Telephone Encounter (Signed)
Pt would like to know if it is okay to take tylenol with Pantoprazole?  Please advise   CB: [phone]

## 2022-05-21 NOTE — Telephone Encounter (Signed)
Spoke with patient today and questions answered. 

## 2022-05-21 NOTE — ED Triage Notes (Signed)
Pt c/o worsening left sided chest pain, shortness of breath on exertion and "coca-cola" colored urine. Denies n/v, no pain with urination, denies back pain.

## 2022-05-21 NOTE — Assessment & Plan Note (Signed)
Stat labs show elevated liver tests. Suspicious for acute viral hepatitis. I will order acute hepatitis panel. Unfortunately this lab test cannot be added on to his blood from today and he will have to return for a separate blood draw.  CT abdomen pelvis has been ordered stat.  Strict precautions that if he is worsening or has any new symptoms such as vomiting, chest pain that he will go to the ED. Otherwise we will obtain CT results and additional lab test and have him follow-up here on Monday.
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
## 2022-05-21 NOTE — Assessment & Plan Note (Signed)
He is not in any acute distress and his exam is fairly benign.  I am concerned regarding bilirubin urea with his epigastric pain.  Possible liver involvement.  Pantoprazole prescribed.  Discussed avoiding spicy, fried or greasy foods.  I will check labs including lipase, CBC, CMP and send him for a CT abdomen pelvis.  He is aware that if his pain worsens, if he begins having chest pain, palpitations, shortness of breath, dizziness or if he starts vomiting or diarrhea returns that he will need to go to the ED.

## 2022-05-21 NOTE — Patient Instructions (Addendum)
Please go downstairs for blood work.  Start on prescription for acid reflux, I sent this to your pharmacy. It is pantoprazole.   I ordered an abdominal CAT scan and you will be contacted to schedule this.  If you have worsening pain, start vomiting or diarrhea returns then you will need to be seen in the emergency department.  Also if you were to develop dizziness, chest pain, shortness of breath then I would also want you to be seen in the emergency department.  Food Choices for Gastroesophageal Reflux Disease, Adult When you have gastroesophageal reflux disease (GERD), the foods you eat and your eating habits are very important. Choosing the right foods can help ease your discomfort. Think about working with a food expert (dietitian) to help you make good choices. What are tips for following this plan? Reading food labels Look for foods that are low in saturated fat. Foods that may help with your symptoms include: Foods that have less than 5% of daily value (DV) of fat. Foods that have 0 grams of trans fat. Cooking Do not fry your food. Cook your food by baking, steaming, grilling, or broiling. These are all methods that do not need a lot of fat for cooking. To add flavor, try to use herbs that are low in spice and acidity. Meal planning  Choose healthy foods that are low in fat, such as: Fruits and vegetables. Whole grains. Low-fat dairy products. Lean meats, fish, and poultry. Eat small meals often instead of eating 3 large meals each day. Eat your meals slowly in a place where you are relaxed. Avoid bending over or lying down until 2-3 hours after eating. Limit high-fat foods such as fatty meats or fried foods. Limit your intake of fatty foods, such as oils, butter, and shortening. Avoid the following as told by your doctor: Foods that cause symptoms. These may be different for different people. Keep a food diary to keep track of foods that cause symptoms. Alcohol. Drinking a  lot of liquid with meals. Eating meals during the 2-3 hours before bed. Lifestyle Stay at a healthy weight. Ask your doctor what weight is healthy for you. If you need to lose weight, work with your doctor to do so safely. Exercise for at least 30 minutes on 5 or more days each week, or as told by your doctor. Wear loose-fitting clothes. Do not smoke or use any products that contain nicotine or tobacco. If you need help quitting, ask your doctor. Sleep with the head of your bed higher than your feet. Use a wedge under the mattress or blocks under the bed frame to raise the head of the bed. Chew sugar-free gum after meals. What foods should eat?  Eat a healthy, well-balanced diet of fruits, vegetables, whole grains, low-fat dairy products, lean meats, fish, and poultry. Each person is different. Foods that may cause symptoms in one person may not cause any symptoms in another person. Work with your doctor to find foods that are safe for you. The items listed above may not be a complete list of what you can eat and drink. Contact a food expert for more options. What foods should I avoid? Limiting some of these foods may help in managing the symptoms of GERD. Everyone is different. Talk with a food expert or your doctor to help you find the exact foods to avoid, if any. Fruits Any fruits prepared with added fat. Any fruits that cause symptoms. For some people, this may include citrus fruits,  such as oranges, grapefruit, pineapple, and lemons. Vegetables Deep-fried vegetables. [place] fries. Any vegetables prepared with added fat. Any vegetables that cause symptoms. For some people, this may include tomatoes and tomato products, chili peppers, onions and garlic, and horseradish. Grains Pastries or quick breads with added fat. Meats and other proteins High-fat meats, such as fatty beef or pork, hot dogs, ribs, ham, sausage, salami, and bacon. Fried meat or protein, including fried fish and fried  chicken. Nuts and nut butters, in large amounts. Dairy Whole milk and chocolate milk. Sour cream. Cream. Ice cream. Cream cheese. Milkshakes. Fats and oils Butter. Margarine. Shortening. Ghee. Beverages Coffee and tea, with or without caffeine. Carbonated beverages. Sodas. Energy drinks. Fruit juice made with acidic fruits, such as orange or grapefruit. Tomato juice. Alcoholic drinks. Sweets and desserts Chocolate and cocoa. Donuts. Seasonings and condiments Pepper. Peppermint and spearmint. Added salt. Any condiments, herbs, or seasonings that cause symptoms. For some people, this may include curry, hot sauce, or vinegar-based salad dressings. The items listed above may not be a complete list of what you should not eat and drink. Contact a food expert for more options. Questions to ask your doctor Diet and lifestyle changes are often the first steps that are taken to manage symptoms of GERD. If diet and lifestyle changes do not help, talk with your doctor about taking medicines. Where to find more information International Foundation for Gastrointestinal Disorders: aboutgerd.org Summary When you have GERD, food and lifestyle choices are very important in easing your symptoms. Eat small meals often instead of 3 large meals a day. Eat your meals slowly and in a place where you are relaxed. Avoid bending over or lying down until 2-3 hours after eating. Limit high-fat foods such as fatty meats or fried foods. This information is not intended to replace advice given to you by your health care provider. Make sure you discuss any questions you have with your health care provider. Document Revised: 05/12/2020 Document Reviewed: 05/12/2020 Elsevier Patient Education  [place].

## 2022-05-21 NOTE — Progress Notes (Signed)
Please call patient and tell him that his liver function tests are very high and this is concerning.  I have added on an acute hepatitis panel to his blood work from earlier and I will let him know when this comes back.  Please get him on the schedule for Monday to be seen again and have more blood work. If he develops vomiting, worsening abdominal pain or any other new symptoms, he will need to go to the emergency department. Please see if his CT has been scheduled. This should be done stat. Thanks.

## 2022-05-21 NOTE — Assessment & Plan Note (Signed)
Flank pain has resolved.  No hematuria.

## 2022-05-21 NOTE — Assessment & Plan Note (Signed)
Improving. None today. Encouraged increasing fluid intake. Labs and CT abdomen ordered. Consider stool studies if diarrhea returns.

## 2022-05-22 ENCOUNTER — Emergency Department (HOSPITAL_COMMUNITY): Payer: Medicare Other

## 2022-05-22 ENCOUNTER — Encounter (HOSPITAL_COMMUNITY): Payer: Self-pay | Admitting: Internal Medicine

## 2022-05-22 DIAGNOSIS — C259 Malignant neoplasm of pancreas, unspecified: Secondary | ICD-10-CM | POA: Diagnosis present

## 2022-05-22 DIAGNOSIS — R7401 Elevation of levels of liver transaminase levels: Secondary | ICD-10-CM

## 2022-05-22 DIAGNOSIS — K8020 Calculus of gallbladder without cholecystitis without obstruction: Secondary | ICD-10-CM | POA: Diagnosis present

## 2022-05-22 DIAGNOSIS — I10 Essential (primary) hypertension: Secondary | ICD-10-CM | POA: Diagnosis not present

## 2022-05-22 DIAGNOSIS — K8689 Other specified diseases of pancreas: Secondary | ICD-10-CM | POA: Diagnosis not present

## 2022-05-22 DIAGNOSIS — N1831 Chronic kidney disease, stage 3a: Secondary | ICD-10-CM

## 2022-05-22 DIAGNOSIS — E669 Obesity, unspecified: Secondary | ICD-10-CM | POA: Diagnosis present

## 2022-05-22 DIAGNOSIS — K810 Acute cholecystitis: Secondary | ICD-10-CM | POA: Diagnosis not present

## 2022-05-22 DIAGNOSIS — Z87891 Personal history of nicotine dependence: Secondary | ICD-10-CM | POA: Diagnosis not present

## 2022-05-22 DIAGNOSIS — Z6834 Body mass index (BMI) 34.0-34.9, adult: Secondary | ICD-10-CM | POA: Diagnosis not present

## 2022-05-22 DIAGNOSIS — R0789 Other chest pain: Secondary | ICD-10-CM | POA: Diagnosis not present

## 2022-05-22 DIAGNOSIS — K831 Obstruction of bile duct: Secondary | ICD-10-CM | POA: Clinically undetermined

## 2022-05-22 DIAGNOSIS — R197 Diarrhea, unspecified: Secondary | ICD-10-CM | POA: Diagnosis present

## 2022-05-22 DIAGNOSIS — J4520 Mild intermittent asthma, uncomplicated: Secondary | ICD-10-CM | POA: Diagnosis present

## 2022-05-22 DIAGNOSIS — E860 Dehydration: Secondary | ICD-10-CM | POA: Diagnosis present

## 2022-05-22 DIAGNOSIS — E785 Hyperlipidemia, unspecified: Secondary | ICD-10-CM

## 2022-05-22 DIAGNOSIS — K219 Gastro-esophageal reflux disease without esophagitis: Secondary | ICD-10-CM | POA: Diagnosis present

## 2022-05-22 DIAGNOSIS — K838 Other specified diseases of biliary tract: Secondary | ICD-10-CM | POA: Diagnosis not present

## 2022-05-22 DIAGNOSIS — R7303 Prediabetes: Secondary | ICD-10-CM

## 2022-05-22 DIAGNOSIS — Z713 Dietary counseling and surveillance: Secondary | ICD-10-CM | POA: Diagnosis not present

## 2022-05-22 DIAGNOSIS — E1122 Type 2 diabetes mellitus with diabetic chronic kidney disease: Secondary | ICD-10-CM | POA: Diagnosis present

## 2022-05-22 DIAGNOSIS — R1013 Epigastric pain: Secondary | ICD-10-CM | POA: Diagnosis not present

## 2022-05-22 DIAGNOSIS — N179 Acute kidney failure, unspecified: Secondary | ICD-10-CM | POA: Diagnosis not present

## 2022-05-22 DIAGNOSIS — I129 Hypertensive chronic kidney disease with stage 1 through stage 4 chronic kidney disease, or unspecified chronic kidney disease: Secondary | ICD-10-CM | POA: Diagnosis present

## 2022-05-22 DIAGNOSIS — Z79899 Other long term (current) drug therapy: Secondary | ICD-10-CM | POA: Diagnosis not present

## 2022-05-22 DIAGNOSIS — R17 Unspecified jaundice: Secondary | ICD-10-CM | POA: Diagnosis not present

## 2022-05-22 DIAGNOSIS — Z8249 Family history of ischemic heart disease and other diseases of the circulatory system: Secondary | ICD-10-CM | POA: Diagnosis not present

## 2022-05-22 DIAGNOSIS — E1151 Type 2 diabetes mellitus with diabetic peripheral angiopathy without gangrene: Secondary | ICD-10-CM | POA: Diagnosis not present

## 2022-05-22 DIAGNOSIS — M1990 Unspecified osteoarthritis, unspecified site: Secondary | ICD-10-CM | POA: Diagnosis present

## 2022-05-22 LAB — HEPATIC FUNCTION PANEL
ALT: 550 U/L — ABNORMAL HIGH (ref 0–44)
AST: 288 U/L — ABNORMAL HIGH (ref 15–41)
Albumin: 3.9 g/dL (ref 3.5–5.0)
Alkaline Phosphatase: 266 U/L — ABNORMAL HIGH (ref 38–126)
Bilirubin, Direct: 4.2 mg/dL — ABNORMAL HIGH (ref 0.0–0.2)
Indirect Bilirubin: 2.4 mg/dL — ABNORMAL HIGH (ref 0.3–0.9)
Total Bilirubin: 6.6 mg/dL — ABNORMAL HIGH (ref 0.3–1.2)
Total Protein: 6.8 g/dL (ref 6.5–8.1)

## 2022-05-22 LAB — URINALYSIS, ROUTINE W REFLEX MICROSCOPIC
Glucose, UA: NEGATIVE mg/dL
Hgb urine dipstick: NEGATIVE
Ketones, ur: 5 mg/dL — AB
Leukocytes,Ua: NEGATIVE
Nitrite: NEGATIVE
Protein, ur: NEGATIVE mg/dL
Specific Gravity, Urine: 1.025 (ref 1.005–1.030)
pH: 5 (ref 5.0–8.0)

## 2022-05-22 LAB — URINE CULTURE: Result:: NO GROWTH

## 2022-05-22 LAB — ACETAMINOPHEN LEVEL: Acetaminophen (Tylenol), Serum: 15 ug/mL (ref 10–30)

## 2022-05-22 LAB — TROPONIN I (HIGH SENSITIVITY): Troponin I (High Sensitivity): 10 ng/L (ref <18)

## 2022-05-22 MED ORDER — LISINOPRIL 5 MG PO TABS
5.0000 mg | ORAL_TABLET | Freq: Every day | ORAL | Status: DC
  Administered 2022-05-22 – 2022-05-24 (×3): 5 mg via ORAL
  Filled 2022-05-22 (×3): qty 1

## 2022-05-22 MED ORDER — PIPERACILLIN-TAZOBACTAM 3.375 G IVPB 30 MIN
3.3750 g | Freq: Once | INTRAVENOUS | Status: AC
  Administered 2022-05-22: 3.375 g via INTRAVENOUS
  Filled 2022-05-22: qty 50

## 2022-05-22 MED ORDER — SODIUM CHLORIDE 0.9 % IV SOLN: INTRAVENOUS | Status: DC

## 2022-05-22 MED ORDER — SODIUM CHLORIDE 0.9% FLUSH
3.0000 mL | Freq: Two times a day (BID) | INTRAVENOUS | Status: DC
Start: 2022-05-22 — End: 2022-05-26
  Administered 2022-05-22 – 2022-05-26 (×8): 3 mL via INTRAVENOUS

## 2022-05-22 MED ORDER — MORPHINE SULFATE (PF) 2 MG/ML IV SOLN
2.0000 mg | INTRAVENOUS | Status: AC | PRN
  Administered 2022-05-22 – 2022-05-23 (×3): 2 mg via INTRAVENOUS
  Filled 2022-05-22 (×3): qty 1

## 2022-05-22 MED ORDER — PIPERACILLIN-TAZOBACTAM 3.375 G IVPB
3.3750 g | Freq: Three times a day (TID) | INTRAVENOUS | Status: DC
  Administered 2022-05-22 – 2022-05-23 (×4): 3.375 g via INTRAVENOUS
  Filled 2022-05-22 (×3): qty 50

## 2022-05-22 MED ORDER — PANTOPRAZOLE SODIUM 40 MG IV SOLR
40.0000 mg | INTRAVENOUS | Status: DC
  Administered 2022-05-22 – 2022-05-26 (×4): 40 mg via INTRAVENOUS
  Filled 2022-05-22 (×4): qty 10

## 2022-05-22 MED ORDER — ALBUTEROL SULFATE (2.5 MG/3ML) 0.083% IN NEBU
2.5000 mg | INHALATION_SOLUTION | Freq: Four times a day (QID) | RESPIRATORY_TRACT | Status: DC | PRN
Start: 2022-05-22 — End: 2022-05-26

## 2022-05-22 MED ORDER — ONDANSETRON HCL 4 MG PO TABS: 4.0000 mg | ORAL_TABLET | Freq: Four times a day (QID) | ORAL | Status: DC | PRN

## 2022-05-22 MED ORDER — ONDANSETRON HCL 4 MG/2ML IJ SOLN: 4.0000 mg | Freq: Four times a day (QID) | INTRAMUSCULAR | Status: DC | PRN

## 2022-05-22 MED ORDER — IOHEXOL 300 MG/ML  SOLN
100.0000 mL | Freq: Once | INTRAMUSCULAR | Status: AC | PRN
  Administered 2022-05-22: 100 mL via INTRAVENOUS

## 2022-05-22 MED ORDER — BISOPROLOL FUMARATE 5 MG PO TABS
10.0000 mg | ORAL_TABLET | Freq: Every day | ORAL | Status: DC
  Administered 2022-05-23 – 2022-05-24 (×2): 10 mg via ORAL
  Filled 2022-05-22: qty 2
  Filled 2022-05-22: qty 1
  Filled 2022-05-22: qty 2

## 2022-05-22 MED ORDER — ENOXAPARIN SODIUM 40 MG/0.4ML IJ SOSY
40.0000 mg | PREFILLED_SYRINGE | INTRAMUSCULAR | Status: DC
  Administered 2022-05-22 – 2022-05-26 (×4): 40 mg via SUBCUTANEOUS
  Filled 2022-05-22 (×4): qty 0.4

## 2022-05-22 MED ORDER — GADOBUTROL 1 MMOL/ML IV SOLN
10.0000 mL | Freq: Once | INTRAVENOUS | Status: AC | PRN
  Administered 2022-05-22: 10 mL via INTRAVENOUS
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
## 2022-05-22 NOTE — ED Provider Notes (Signed)
[facility] EMERGENCY DEPARTMENT Provider Note   CSN: [id number] Arrival date & time: 05/21/22  2237     History  Chief Complaint  Patient presents with   Chest Pain    [name] is a 76 y.o. male.  76 yo M with a chief complaints of epigastric abdominal discomfort.  This been going on for about a week.  Seems to come and go.  Nothing seems to make it better or worse.  He has been taking Tylenol to try and help him with this.  He is to take a lot of BC powders but his doctor told him to stop because it was bothering his kidneys.  He denies exertional symptoms denies cough congestion or fever denies nausea vomiting.  He has had some diarrhea that he describes as light in color.  Has been able to eat and drink without issue.   Chest Pain      Home Medications Prior to Admission medications   Medication Sig Start Date End Date Taking? Authorizing Provider  ACCU-CHEK GUIDE test strip USE TO check blood sugar DAILY AS DIRECTED 08/14/20   Burns, Stacy J, MD  Acetaminophen (TYLENOL ARTHRITIS PAIN PO) Take by mouth.    [provider]  albuterol (VENTOLIN HFA) 108 (90 Base) MCG/ACT inhaler Inhale 2 puffs in the lungs every 4 hours as needed for cough, wheezing, or SOB. 12/07/21   Burns, Stacy J, MD  bisoprolol-hydrochlorothiazide (ZIAC) 10-6.25 MG tablet Take 1 tablet by mouth daily. 08/05/21   Burns, Stacy J, MD  Blood Glucose Monitoring Suppl (FREESTYLE FREEDOM LITE) w/Device KIT 1 each by Does not apply route daily at 2 am. Patient taking differently: 1 each by Does not apply route daily at 2 am. ACCU-CHECK GUIDE 05/02/20   Burns, Stacy J, MD  finasteride (PROSCAR) 5 MG tablet Take 5 mg by mouth daily. 08/31/21   [provider]  Lancets MISC Test blood sugar daily. Dx code: 250.00 05/17/14   Marte, Heather M, PA-C  lisinopril (ZESTRIL) 5 MG tablet Take 1 tablet (5 mg total) by mouth daily. 02/04/22   Burns, Stacy J, MD  loratadine (CLARITIN) 10 MG  tablet Take 10 mg by mouth daily as needed.     [provider]  Misc Natural Products (OSTEO BI-FLEX TRIPLE STRENGTH) TABS Take by mouth.    [provider]  Multiple Vitamin (MULTIVITAMIN) capsule Take 1 capsule by mouth daily.    [provider]  nitroGLYCERIN (NITROSTAT) 0.4 MG SL tablet If you need to use this medication please inform your cardiologist and Dr. Daub. 09/13/14   Clark, Michael L, PA-C  pantoprazole (PROTONIX) 40 MG tablet Take 1 tablet (40 mg total) by mouth daily. 05/21/22   Henson, Vickie L, NP-C  silodosin (RAPAFLO) 8 MG CAPS capsule Take 8 mg by mouth daily. 08/31/21   [provider]  simvastatin (ZOCOR) 20 MG tablet TAKE ONE TABLET BY MOUTH EVERYDAY AT BEDTIME 08/05/21   Burns, Stacy J, MD      Allergies    Patient has no known allergies.    Review of Systems   Review of Systems  Cardiovascular:  Positive for chest pain.    Physical Exam Updated Vital Signs BP 139/71 (BP Location: Right Arm)   Pulse (!) 58   Temp 98.3 F (36.8 C) (Oral)   Resp 18   SpO2 98%  Physical Exam Vitals and nursing note reviewed.  Constitutional:      Appearance: He is well-developed.    HENT:     Head: Normocephalic and atraumatic.  Eyes:     Pupils: Pupils are equal, round, and reactive to light.  Neck:     Vascular: No JVD.  Cardiovascular:     Rate and Rhythm: Normal rate and regular rhythm.     Heart sounds: No murmur heard.    No friction rub. No gallop.  Pulmonary:     Effort: No respiratory distress.     Breath sounds: No wheezing.  Abdominal:     General: There is no distension.     Tenderness: There is no abdominal tenderness. There is no guarding or rebound.     Comments: No obvious abdominal discomfort.  No pain in the right upper quadrant.  Negative Murphy's.  Musculoskeletal:        General: Normal range of motion.     Cervical back: Normal range of motion and neck supple.  Skin:    Coloration: Skin is not pale.      Findings: No rash.  Neurological:     Mental Status: He is alert and oriented to person, place, and time.  Psychiatric:        Behavior: Behavior normal.     ED Results / Procedures / Treatments   Labs (all labs ordered are listed, but only abnormal results are displayed) Labs Reviewed  BASIC METABOLIC PANEL - Abnormal; Notable for the following components:      Result Value   CO2 20 (*)    Glucose, Bld 134 (*)    Creatinine, Ser 1.39 (*)    GFR, Estimated 52 (*)    All other components within normal limits  URINALYSIS, ROUTINE W REFLEX MICROSCOPIC - Abnormal; Notable for the following components:   Color, Urine AMBER (*)    Bilirubin Urine SMALL (*)    Ketones, ur 5 (*)    All other components within normal limits  CBC  HEPATIC FUNCTION PANEL  ACETAMINOPHEN LEVEL  TROPONIN I (HIGH SENSITIVITY)  TROPONIN I (HIGH SENSITIVITY)    EKG EKG Interpretation  Date/Time:  Friday May 21 2022 22:54:40 EDT Ventricular Rate:  59 PR Interval:  144 QRS Duration: 84 QT Interval:  418 QTC Calculation: 413 R Axis:   46 Text Interpretation: Sinus bradycardia Nonspecific T wave abnormality Abnormal ECG No significant change since last tracing Confirmed by Deno Etienne [phone]) on 05/22/2022 1:28:53 AM  Radiology DG Chest 2 View  Result Date: 05/21/2022 CLINICAL DATA:  Chest pain EXAM: CHEST - 2 VIEW COMPARISON:  12/05/2011 FINDINGS: The right lung is grossly clear. Surgical plate and fixating screws left third through sixth ribs with cerclage wire at the left third and fourth ribs, chronic fracture through the first cerclage wire. Multiple old fracture deformities of the left thoracic cage. No acute airspace disease. Stable cardiomediastinal silhouette. No pneumothorax IMPRESSION: No active cardiopulmonary disease. Chronic postsurgical and posttraumatic changes of the left thoracic cage Electronically Signed   By: Donavan Foil M.D.   On: 05/21/2022 23:42    Procedures Procedures     Medications Ordered in ED Medications - No data to display  ED Course/ Medical Decision Making/ A&P                           Medical Decision Making Amount and/or Complexity of Data Reviewed Labs: ordered. Radiology: ordered.  Risk Prescription drug management. Decision regarding hospitalization.   76 yo M with a chief complaints of epigastric abdominal pain.  This has  been going on for about a week now.  On my record review he saw his family doctor yesterday had LFTs that were all elevated and a total bilirubin of 5.7.  An outpatient CT scan was ordered with oral and IV contrast to be performed on Monday.  Patient tells me that his symptoms of gotten a bit worse and are bit more persistent.  When you talk this over this family doctor they suggested he come to the ER for evaluation.  He was seen in triage and the MSE process had a CBC and a BMP without significant electrolyte abnormality no change in renal function 2 troponins are negative..  We will repeat his LFTs today.  CT scan of the abdomen pelvis with IV contrast.  CT scan concerning for acute cholecystitis with dilatation of the common bile duct.  There is also concern for a soft tissue mass around the hepatic artery could be pancreatic in origin per radiology.  I discussed this with general surgery, Dr. Dema Severin recommended medical admission and IV antibiotics.  We will discuss with medicine for admission.  The patients results and plan were reviewed and discussed.   Any x-rays performed were independently reviewed by myself.   Differential diagnosis were considered with the presenting HPI.  Medications  piperacillin-tazobactam (ZOSYN) IVPB 3.375 g (has no administration in time range)  iohexol (OMNIPAQUE) 300 MG/ML solution 100 mL (100 mLs Intravenous Contrast Given 05/22/22 0458)    Vitals:   05/22/22 0355 05/22/22 0415 05/22/22 0530 05/22/22 0545  BP: (!) 143/76 135/72 132/76 132/75  Pulse: 64 (!) 59 62 68  Resp: _0 Temp:      TempSrc:      SpO2: 96% 99% 98% 99%    Final diagnoses:  Biliary obstruction    Admission/ observation were discussed with the admitting physician, patient and/or family and they are comfortable with the plan.         Final Clinical Impression(s) / ED Diagnoses Final diagnoses:  None    Rx / DC Orders ED Discharge Orders     None         Deno Etienne, DO 05/22/22 [phone]
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
## 2022-05-23 DIAGNOSIS — K8689 Other specified diseases of pancreas: Secondary | ICD-10-CM | POA: Diagnosis not present

## 2022-05-23 DIAGNOSIS — K219 Gastro-esophageal reflux disease without esophagitis: Secondary | ICD-10-CM

## 2022-05-23 LAB — COMPREHENSIVE METABOLIC PANEL
ALT: 394 U/L — ABNORMAL HIGH (ref 0–44)
AST: 216 U/L — ABNORMAL HIGH (ref 15–41)
Albumin: 3.2 g/dL — ABNORMAL LOW (ref 3.5–5.0)
Alkaline Phosphatase: 257 U/L — ABNORMAL HIGH (ref 38–126)
Anion gap: 12 (ref 5–15)
BUN: 10 mg/dL (ref 8–23)
CO2: 20 mmol/L — ABNORMAL LOW (ref 22–32)
Calcium: 8.8 mg/dL — ABNORMAL LOW (ref 8.9–10.3)
Chloride: 102 mmol/L (ref 98–111)
Creatinine, Ser: 1.2 mg/dL (ref 0.61–1.24)
GFR, Estimated: >60 mL/min (ref >60)
Glucose, Bld: 133 mg/dL — ABNORMAL HIGH (ref 70–99)
Potassium: 4.2 mmol/L (ref 3.5–5.1)
Sodium: 134 mmol/L — ABNORMAL LOW (ref 135–145)
Total Bilirubin: 7.4 mg/dL — ABNORMAL HIGH (ref 0.3–1.2)
Total Protein: 6.3 g/dL — ABNORMAL LOW (ref 6.5–8.1)

## 2022-05-23 LAB — CBC
HCT: 42.8 % (ref 39.0–52.0)
Hemoglobin: 14.3 g/dL (ref 13.0–17.0)
MCH: 28.8 pg (ref 26.0–34.0)
MCHC: 33.4 g/dL (ref 30.0–36.0)
MCV: 86.1 fL (ref 80.0–100.0)
Platelets: 137 10*3/uL — ABNORMAL LOW (ref 150–400)
RBC: 4.97 MIL/uL (ref 4.22–5.81)
RDW: 14 % (ref 11.5–15.5)
WBC: 6.5 10*3/uL (ref 4.0–10.5)
nRBC: 0 % (ref 0.0–0.2)

## 2022-05-23 MED ORDER — MORPHINE SULFATE (PF) 2 MG/ML IV SOLN
2.0000 mg | INTRAVENOUS | Status: DC | PRN
  Administered 2022-05-23 – 2022-05-25 (×8): 2 mg via INTRAVENOUS
  Filled 2022-05-23 (×8): qty 1

## 2022-05-23 MED ORDER — HYDROMORPHONE HCL 1 MG/ML IJ SOLN
1.0000 mg | INTRAMUSCULAR | Status: AC
  Administered 2022-05-23: 1 mg via INTRAVENOUS
  Filled 2022-05-23: qty 1
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
## 2022-05-23 NOTE — Assessment & Plan Note (Signed)
-   patient has history of CKD3a. Baseline creat ~ 1.32

## 2022-05-23 NOTE — Assessment & Plan Note (Signed)
-   MRCP did suggest possible acute cholecystitis however patient is afebrile with no leukocytosis.  Abdominal pain felt to be due to other underlying pathology at play -Appreciate general surgery and GI evaluations - Plan at this time is for undergoing EUS and ERCP - Discontinue Zosyn and monitor clinically

## 2022-05-23 NOTE — Assessment & Plan Note (Signed)
-   Albuterol nebs as needed for shortness of breath/wheezing

## 2022-05-23 NOTE — Progress Notes (Signed)
Patient c/o of chest pain,abdominal pain,on a pain scale 10/10,prn   Morphine given,vital signs stable,incoming nurse made aware.

## 2022-05-23 NOTE — Assessment & Plan Note (Signed)
Continue PPI ?

## 2022-05-23 NOTE — Assessment & Plan Note (Signed)
-   Complicates overall prognosis and care - There is no height or weight on file to calculate BMI. - Weight Loss and Dietary Counseling given

## 2022-05-23 NOTE — Assessment & Plan Note (Signed)
-   Noted on MRCP.  Concern is for underlying pancreatic cancer - s/p EUS with FNA pancreatic head mass; await biopsy results vs outpatient followup with oncology (oncology aware, Dr. Burr Medico, and planning to possibly see inpatient otherwise outpt followup to be scheduled)

## 2022-05-23 NOTE — Progress Notes (Signed)
[name], please call patient, he was admitted to the hospital over the weekend. Let him know that we are aware that he is in the hospital and that we hope he is feeling better.  He will not need the appointment on Monday with Dr. Ronnald Ramp.

## 2022-05-23 NOTE — Assessment & Plan Note (Signed)
-   Statin on hold in setting of elevated LFTs
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
## 2022-05-23 NOTE — Assessment & Plan Note (Signed)
Home blood pressure regimen includes bisoprolol-hydrochlorothiazide 10-6.25 mg daily and lisinopril 5 mg daily. -Continue lisinopril and bisoprolol -Held hydrochlorothiazide

## 2022-05-23 NOTE — Assessment & Plan Note (Signed)
-  see above 

## 2022-05-23 NOTE — Assessment & Plan Note (Signed)
-   Suspected due to obstruction, from mass; see obstructive jaundice - Continue trending LFTs

## 2022-05-23 NOTE — Progress Notes (Signed)
Subjective: Abdominal pain last night, better with IV analgesics.  Objective: Vital signs in last 24 hours: Temp:  [97.5 F (36.4 C)-99.1 F (37.3 C)] 97.5 F (36.4 C) (07/09 0920) Pulse Rate:  [62-69] 62 (07/09 0920) Resp:  [16-21] 17 (07/09 0920) BP: (92-147)/(53-81) 145/66 (07/09 0920) SpO2:  [100 %] 100 % (07/09 0920) Weight change:  Last BM Date : 05/18/22  PE: GEN:  NAD ABD:  Soft, non-tender  Lab Results: CBC    Component Value Date/Time   WBC 6.5 05/23/2022 0035   RBC 4.97 05/23/2022 0035   HGB 14.3 05/23/2022 0035   HCT 42.8 05/23/2022 0035   PLT 137 (L) 05/23/2022 0035   MCV 86.1 05/23/2022 0035   MCH 28.8 05/23/2022 0035   MCHC 33.4 05/23/2022 0035   RDW 14.0 05/23/2022 0035   LYMPHSABS 0.6 (L) 05/21/2022 1211   MONOABS 0.8 05/21/2022 1211   EOSABS 0.0 05/21/2022 1211   BASOSABS 0.0 05/21/2022 1211   CMP     Component Value Date/Time   NA 134 (L) 05/23/2022 0035   K 4.2 05/23/2022 0035   CL 102 05/23/2022 0035   CO2 20 (L) 05/23/2022 0035   GLUCOSE 133 (H) 05/23/2022 0035   BUN 10 05/23/2022 0035   CREATININE 1.20 05/23/2022 0035   CREATININE 1.28 (H) 07/31/2020 1001   CALCIUM 8.8 (L) 05/23/2022 0035   PROT 6.3 (L) 05/23/2022 0035   ALBUMIN 3.2 (L) 05/23/2022 0035   AST 216 (H) 05/23/2022 0035   ALT 394 (H) 05/23/2022 0035   ALKPHOS 257 (H) 05/23/2022 0035   BILITOT 7.4 (H) 05/23/2022 0035   GFRNONAA >60 05/23/2022 0035   GFRNONAA 54 (L) 07/31/2020 1001   GFRAA 63 07/31/2020 1001   Assessment:   Obstructive jaundice. Suspected pancreatic head/neck mass on imaging. Elevated LFTs, likely from #1 and #2 above. Abdominal pain, recurrent.  Imaging states could be cholecystitis, but overall I suspect gallbladder distention and wall thickening more from biliary obstruction.  Clinically I do not suspect cholecystitis. Diarrhea.  Plan:   Given recurrent abdominal pain, I feel it's best to keep patient in hospital for pain management. Patient is  scheduled for EUS and ERCP on Tuesday morning at 730 am. [facility] will follow.   Landry Dyke 05/23/2022, 12:21 PM   Cell [phone] If no answer or after 5 PM call [phone]

## 2022-05-23 NOTE — Assessment & Plan Note (Addendum)
Last A1c 6.3% on 02/04/2022 - Continue diet control

## 2022-05-24 ENCOUNTER — Inpatient Hospital Stay: Admission: RE | Admit: 2022-05-24 | Payer: Medicare Other | Source: Ambulatory Visit

## 2022-05-24 ENCOUNTER — Ambulatory Visit: Payer: Medicare Other | Admitting: Emergency Medicine

## 2022-05-24 ENCOUNTER — Ambulatory Visit: Payer: Medicare Other | Admitting: Internal Medicine

## 2022-05-24 DIAGNOSIS — K8689 Other specified diseases of pancreas: Secondary | ICD-10-CM | POA: Diagnosis not present

## 2022-05-24 LAB — CBC WITH DIFFERENTIAL/PLATELET
Abs Immature Granulocytes: 0.07 10*3/uL (ref 0.00–0.07)
Basophils Absolute: 0 10*3/uL (ref 0.0–0.1)
Basophils Relative: 0 %
Eosinophils Absolute: 0 10*3/uL (ref 0.0–0.5)
Eosinophils Relative: 0 %
HCT: 49.6 % (ref 39.0–52.0)
Hemoglobin: 16.4 g/dL (ref 13.0–17.0)
Immature Granulocytes: 1 %
Lymphocytes Relative: 8 %
Lymphs Abs: 0.6 10*3/uL — ABNORMAL LOW (ref 0.7–4.0)
MCH: 28.6 pg (ref 26.0–34.0)
MCHC: 33.1 g/dL (ref 30.0–36.0)
MCV: 86.4 fL (ref 80.0–100.0)
Monocytes Absolute: 0.9 10*3/uL (ref 0.1–1.0)
Monocytes Relative: 12 %
Neutro Abs: 5.8 10*3/uL (ref 1.7–7.7)
Neutrophils Relative %: 79 %
Platelets: 150 10*3/uL (ref 150–400)
RBC: 5.74 MIL/uL (ref 4.22–5.81)
RDW: 14.5 % (ref 11.5–15.5)
WBC: 7.3 10*3/uL (ref 4.0–10.5)
nRBC: 0 % (ref 0.0–0.2)

## 2022-05-24 LAB — COMPREHENSIVE METABOLIC PANEL
ALT: 431 U/L — ABNORMAL HIGH (ref 0–44)
AST: 290 U/L — ABNORMAL HIGH (ref 15–41)
Albumin: 3.2 g/dL — ABNORMAL LOW (ref 3.5–5.0)
Alkaline Phosphatase: 332 U/L — ABNORMAL HIGH (ref 38–126)
Anion gap: 12 (ref 5–15)
BUN: 7 mg/dL — ABNORMAL LOW (ref 8–23)
CO2: 21 mmol/L — ABNORMAL LOW (ref 22–32)
Calcium: 9 mg/dL (ref 8.9–10.3)
Chloride: 103 mmol/L (ref 98–111)
Creatinine, Ser: 1.07 mg/dL (ref 0.61–1.24)
GFR, Estimated: >60 mL/min (ref >60)
Glucose, Bld: 135 mg/dL — ABNORMAL HIGH (ref 70–99)
Potassium: 4.1 mmol/L (ref 3.5–5.1)
Sodium: 136 mmol/L (ref 135–145)
Total Bilirubin: 9.7 mg/dL — ABNORMAL HIGH (ref 0.3–1.2)
Total Protein: 6.7 g/dL (ref 6.5–8.1)

## 2022-05-24 LAB — MAGNESIUM: Magnesium: 2 mg/dL (ref 1.7–2.4)

## 2022-05-24 MED ORDER — POLYETHYLENE GLYCOL 3350 17 G PO PACK
17.0000 g | PACK | Freq: Every day | ORAL | Status: DC
  Administered 2022-05-24: 17 g via ORAL
  Filled 2022-05-24 (×2): qty 1

## 2022-05-24 MED ORDER — SENNOSIDES-DOCUSATE SODIUM 8.6-50 MG PO TABS
1.0000 | ORAL_TABLET | Freq: Two times a day (BID) | ORAL | Status: DC
  Administered 2022-05-24 – 2022-05-25 (×3): 1 via ORAL
  Filled 2022-05-24 (×4): qty 1

## 2022-05-24 MED ORDER — OXYCODONE HCL 5 MG PO TABS
5.0000 mg | ORAL_TABLET | ORAL | Status: DC | PRN
  Administered 2022-05-24 (×3): 5 mg via ORAL
  Filled 2022-05-24 (×4): qty 1

## 2022-05-24 NOTE — Progress Notes (Signed)
[facility] Progress Note  [name] 76 y.o. Jan 14, 1946  CC: Pancreatic mass   Subjective: Patient states he continues to have upper abdominal pain, though improved compared to last night.  Has not had a bowel movement since July 4.  Denies nausea/vomiting.  ROS : Review of Systems  Constitutional:  Negative for chills, fever and weight loss.  Gastrointestinal:  Positive for abdominal pain. Negative for blood in stool, constipation, diarrhea, heartburn, melena, nausea and vomiting.  Skin:  Negative for itching and rash.      Objective: Vital signs in last 24 hours: Vitals:   05/24/22 0452 05/24/22 0744  BP: (!) 146/57 (!) 150/66  Pulse: 68 72  Resp: 18 18  Temp: 97.9 F (36.6 C) 98.8 F (37.1 C)  SpO2:  98%    Physical Exam:  General:  Alert, cooperative, no distress  Head:  Normocephalic, without obvious abnormality, atraumatic  Eyes:  icteric sclera, EOM's intact  Lungs:   Clear to auscultation bilaterally, respirations unlabored  Heart:  Regular rate and rhythm, S1, S2 normal  Abdomen:   Bowel sounds present, abdomen soft, nonspecific tenderness throughout    Lab Results: Recent Labs    05/23/22 0035 05/24/22 0143  NA 134* 136  K 4.2 4.1  CL 102 103  CO2 20* 21*  GLUCOSE 133* 135*  BUN 10 7*  CREATININE 1.20 1.07  CALCIUM 8.8* 9.0  MG  --  2.0   Recent Labs    05/23/22 0035 05/24/22 0143  AST 216* 290*  ALT 394* 431*  ALKPHOS 257* 332*  BILITOT 7.4* 9.7*  PROT 6.3* 6.7  ALBUMIN 3.2* 3.2*   Recent Labs    05/21/22 1211 05/21/22 2305 05/23/22 0035 05/24/22 0143  WBC 7.7   < > 6.5 7.3  NEUTROABS 6.2  --   --  5.8  HGB 14.9   < > 14.3 16.4  HCT 45.0   < > 42.8 49.6  MCV 87.2   < > 86.1 86.4  PLT 157.0   < > 137* 150   < > = values in this interval not displayed.   No results for input(s): "LABPROT", "INR" in the last 72 hours.    Assessment Obstructive jaundice, suspected pancreatic head/neck mass on imaging,  elevated LFTs -AST 290/ALT 431/alk phos 332, trending up -T. bili 9.7 (7.4) -No leukocytosis -MRCP 7/8 shows possible soft tissue mass around head/neck of pancreas and common bile duct obstruction as well as cholelithiasis  Plan: LFTs continuing to trend up.  Plan for EUS/ERCP tomorrow. Continue to trend LFTs Continue supportive care [facility] will follow  Garnette Scheuermann PA-C 05/24/2022, 10:05 AM  Contact #  [phone]

## 2022-05-24 NOTE — Plan of Care (Signed)
  Problem: Clinical Measurements: Goal: Diagnostic test results will improve Outcome: Not Progressing   Problem: Clinical Measurements: Goal: Will remain free from infection Outcome: Not Progressing   Problem: Pain Managment: Goal: General experience of comfort will improve Outcome: Not Progressing

## 2022-05-24 NOTE — Anesthesia Preprocedure Evaluation (Signed)
Anesthesia Evaluation  Patient identified by MRN, date of birth, ID band Patient awake    Reviewed: Allergy & Precautions, NPO status , Patient's Chart, lab work & pertinent test results  Airway Mallampati: II  TM Distance: >3 FB Neck ROM: Full    Dental no notable dental hx. (+) Chipped, Missing, Dental Advisory Given,    Pulmonary asthma , former smoker,    Pulmonary exam normal breath sounds clear to auscultation       Cardiovascular hypertension, + Peripheral Vascular Disease  Normal cardiovascular exam Rhythm:Regular Rate:Normal     Neuro/Psych    GI/Hepatic GERD  ,CBD obstruction, pancreatic mass   Endo/Other  diabetes, Type 2  Renal/GU Renal InsufficiencyRenal diseaseLab Results      Component                Value               Date                      CREATININE               1.07                05/24/2022                K                        4.1                 05/24/2022                  Musculoskeletal  (+) Arthritis ,   Abdominal (+) + obese (BMI 34.4),   Peds  Hematology Lab Results      Component                Value               Date                      WBC                      7.3                 05/24/2022                HGB                      16.4                05/24/2022                HCT                      49.6                05/24/2022                MCV                      86.4                05/24/2022                PLT  150                 05/24/2022              Anesthesia Other Findings   Reproductive/Obstetrics                            Anesthesia Physical Anesthesia Plan  ASA: 3  Anesthesia Plan: General   Post-op Pain Management:    Induction: Intravenous  PONV Risk Score and Plan: Treatment may vary due to age or medical condition and Ondansetron  Airway Management Planned: Oral ETT  Additional Equipment:  None  Intra-op Plan:   Post-operative Plan: Extubation in OR  Informed Consent: I have reviewed the patients History and Physical, chart, labs and discussed the procedure including the risks, benefits and alternatives for the proposed anesthesia with the patient or authorized representative who has indicated his/her understanding and acceptance.     Dental advisory given  Plan Discussed with:   Anesthesia Plan Comments:        Anesthesia Quick Evaluation

## 2022-05-24 NOTE — Progress Notes (Signed)
Subjective: Bilirubin up to 9.7 today. Patient reports continued epigastric pain but says it has improved since admission. Afebrile.  Objective: Vital signs in last 24 hours: Temp:  [97.5 F (36.4 C)-98.6 F (37 C)] 97.9 F (36.6 C) (07/10 0452) Pulse Rate:  [62-68] 68 (07/10 0452) Resp:  [16-20] 18 (07/10 0452) BP: (136-153)/(57-66) 146/57 (07/10 0452) SpO2:  [98 %-100 %] 98 % (07/09 2233) Last BM Date : 05/18/22  Intake/Output from previous day: 07/09 0701 - 07/10 0700 In: 2092.1 [P.O.:320; I.V.:1720.1; IV Piggyback:52] Out: 993 [Urine:995] Intake/Output this shift: No intake/output data recorded.  PE: General: resting comfortably, NAD Neuro: alert and oriented, no focal deficits Resp: normal work of breathing Abdomen: soft, nondistended, nontender to palpation Extremities: warm and well-perfused   Lab Results:  Recent Labs    05/23/22 0035 05/24/22 0143  WBC 6.5 7.3  HGB 14.3 16.4  HCT 42.8 49.6  PLT 137* 150   BMET Recent Labs    05/23/22 0035 05/24/22 0143  NA 134* 136  K 4.2 4.1  CL 102 103  CO2 20* 21*  GLUCOSE 133* 135*  BUN 10 7*  CREATININE 1.20 1.07  CALCIUM 8.8* 9.0   PT/INR No results for input(s): "LABPROT", "INR" in the last 72 hours. CMP     Component Value Date/Time   NA 136 05/24/2022 0143   K 4.1 05/24/2022 0143   CL 103 05/24/2022 0143   CO2 21 (L) 05/24/2022 0143   GLUCOSE 135 (H) 05/24/2022 0143   BUN 7 (L) 05/24/2022 0143   CREATININE 1.07 05/24/2022 0143   CREATININE 1.28 (H) 07/31/2020 1001   CALCIUM 9.0 05/24/2022 0143   PROT 6.7 05/24/2022 0143   ALBUMIN 3.2 (L) 05/24/2022 0143   AST 290 (H) 05/24/2022 0143   ALT 431 (H) 05/24/2022 0143   ALKPHOS 332 (H) 05/24/2022 0143   BILITOT 9.7 (H) 05/24/2022 0143   GFRNONAA >60 05/24/2022 0143   GFRNONAA 54 (L) 07/31/2020 1001   GFRAA 63 07/31/2020 1001   Lipase     Component Value Date/Time   LIPASE 35.0 05/21/2022 1211       Studies/Results: MR  ABDOMEN MRCP W WO CONTAST  Result Date: 05/22/2022 CLINICAL DATA:  Evaluate for pancreas neoplasm. EXAM: MRI ABDOMEN WITHOUT AND WITH CONTRAST (INCLUDING MRCP) TECHNIQUE: Multiplanar multisequence MR imaging of the abdomen was performed both before and after the administration of intravenous contrast. Heavily T2-weighted images of the biliary and pancreatic ducts were obtained, and three-dimensional MRCP images were rendered by post processing. CONTRAST:  55m GADAVIST GADOBUTROL 1 MMOL/ML IV SOLN COMPARISON:  CT from earlier today FINDINGS: Exam detail diminished by motion artifact. Lower chest: No acute abnormality. Hepatobiliary: No enhancing liver lesions. The gallbladder appears diffusely distended. Gallbladder wall enhancement and thickening is identified. Small stone identified within the dependent portion of the gallbladder measuring 7 mm. Surrounding fat stranding is identified around the gallbladder. Mild intrahepatic bile duct dilatation. Increase caliber of the common bile measures up to 1.2 cm. There is an abrupt caliber change of the common bile duct at the head of pancreas. No signs of choledocholithiasis. Pancreas: Within the head of pancreas the CBD measures 3 mm. As noted on the CT from earlier today there is a poorly defined area infiltrative soft tissue centered around the head/neck junction of pancreas. 2.3 x 1.8 by 2.2 cm. There appears to be partial involvement along the anterior wall of the extrahepatic portal vein, image 37/26. In case mint and narrowing  the hepatic artery is better visualized on the CT from earlier today. The superior mesenteric artery does not appear involved. No signs main duct obstruction There is no main duct dilatation identified. Spleen:  Within normal limits in size and appearance. Adrenals/Urinary Tract: Normal adrenal glands. No signs of hydronephrosis. Bilateral simple appearing kidney cysts are noted. No follow-up recommended. Stomach/Bowel: Visualized portions  within the abdomen are unremarkable. Vascular/Lymphatic: Normal caliber of the abdominal aorta. No upper abdominal adenopathy. Other: Small volume of perihepatic free fluid noted. No discrete fluid collections. No signs of peritoneal nodularity. Musculoskeletal: No suspicious bone lesions identified. IMPRESSION: 1. Exam detail diminished by motion artifact. 2. There is a poorly defined area of infiltrative soft tissue centered around the head/neck junction of pancreas. This appears to involve the common bile duct which appears partially obstructed. There also signs suggestive of extrahepatic portal vein and hepatic vein involvement. The diagnosis of exclusion is pancreatic adenocarcinoma. No signs nodal or liver metastasis. Following resolution of patient's acute cholecystitis recommend more definitive characterization with upper endoscopy and endoscopic ultrasound. 3. Signs of acute cholecystitis. Small stone is noted within the dependent portion of the gallbladder. No choledocholithiasis identified. 4. Small volume of perihepatic free fluid. Electronically Signed   By: Kerby Moors M.D.   On: 05/22/2022 10:08        Assessment/Plan 76 yo male presenting with abdominal pain and obstructive jaundice. Imaging shows obstruction of the distal common bile duct. GI planning for ERCP tomorrow. Patient does not clinically have signs of acute cholecystitis.  No indication for acute surgical intervention. Surgery will continue to follow.    LOS: 2 days    Michaelle Birks, MD [facility] Surgery General, Hepatobiliary and Pancreatic Surgery 05/24/22 8:35 AM
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
## 2022-05-25 ENCOUNTER — Encounter (HOSPITAL_COMMUNITY): Payer: Self-pay | Admitting: Internal Medicine

## 2022-05-25 ENCOUNTER — Inpatient Hospital Stay (HOSPITAL_COMMUNITY): Payer: Medicare Other

## 2022-05-25 ENCOUNTER — Inpatient Hospital Stay (HOSPITAL_COMMUNITY): Payer: Medicare Other | Admitting: Anesthesiology

## 2022-05-25 ENCOUNTER — Encounter (HOSPITAL_COMMUNITY)
Admission: EM | Disposition: A | Discharge status: Home / Self Care | Payer: Self-pay | Source: Home / Self Care | Attending: Internal Medicine

## 2022-05-25 DIAGNOSIS — I10 Essential (primary) hypertension: Secondary | ICD-10-CM

## 2022-05-25 DIAGNOSIS — E1151 Type 2 diabetes mellitus with diabetic peripheral angiopathy without gangrene: Secondary | ICD-10-CM

## 2022-05-25 DIAGNOSIS — K8689 Other specified diseases of pancreas: Secondary | ICD-10-CM

## 2022-05-25 DIAGNOSIS — E1122 Type 2 diabetes mellitus with diabetic chronic kidney disease: Secondary | ICD-10-CM

## 2022-05-25 DIAGNOSIS — R0789 Other chest pain: Secondary | ICD-10-CM

## 2022-05-25 DIAGNOSIS — R17 Unspecified jaundice: Secondary | ICD-10-CM

## 2022-05-25 DIAGNOSIS — K838 Other specified diseases of biliary tract: Secondary | ICD-10-CM

## 2022-05-25 DIAGNOSIS — K831 Obstruction of bile duct: Secondary | ICD-10-CM | POA: Diagnosis not present

## 2022-05-25 DIAGNOSIS — N189 Chronic kidney disease, unspecified: Secondary | ICD-10-CM

## 2022-05-25 DIAGNOSIS — Z87891 Personal history of nicotine dependence: Secondary | ICD-10-CM

## 2022-05-25 DIAGNOSIS — R1013 Epigastric pain: Secondary | ICD-10-CM

## 2022-05-25 DIAGNOSIS — I129 Hypertensive chronic kidney disease with stage 1 through stage 4 chronic kidney disease, or unspecified chronic kidney disease: Secondary | ICD-10-CM

## 2022-05-25 HISTORY — PX: SPHINCTEROTOMY: SHX5544

## 2022-05-25 HISTORY — PX: BILIARY STENT PLACEMENT: SHX5538

## 2022-05-25 HISTORY — PX: UPPER ESOPHAGEAL ENDOSCOPIC ULTRASOUND (EUS): SHX6562

## 2022-05-25 HISTORY — PX: ESOPHAGOGASTRODUODENOSCOPY (EGD) WITH PROPOFOL: SHX5813

## 2022-05-25 HISTORY — PX: FINE NEEDLE ASPIRATION: SHX5430

## 2022-05-25 HISTORY — PX: ERCP: SHX5425

## 2022-05-25 LAB — COMPREHENSIVE METABOLIC PANEL
ALT: 433 U/L — ABNORMAL HIGH (ref 0–44)
AST: 405 U/L — ABNORMAL HIGH (ref 15–41)
Albumin: 2.7 g/dL — ABNORMAL LOW (ref 3.5–5.0)
Alkaline Phosphatase: 362 U/L — ABNORMAL HIGH (ref 38–126)
Anion gap: 11 (ref 5–15)
BUN: 11 mg/dL (ref 8–23)
CO2: 21 mmol/L — ABNORMAL LOW (ref 22–32)
Calcium: 8.9 mg/dL (ref 8.9–10.3)
Chloride: 105 mmol/L (ref 98–111)
Creatinine, Ser: 1.02 mg/dL (ref 0.61–1.24)
GFR, Estimated: >60 mL/min (ref >60)
Glucose, Bld: 176 mg/dL — ABNORMAL HIGH (ref 70–99)
Potassium: 4.3 mmol/L (ref 3.5–5.1)
Sodium: 137 mmol/L (ref 135–145)
Total Bilirubin: 9.3 mg/dL — ABNORMAL HIGH (ref 0.3–1.2)
Total Protein: 5.6 g/dL — ABNORMAL LOW (ref 6.5–8.1)

## 2022-05-25 LAB — CBC WITH DIFFERENTIAL/PLATELET
Abs Immature Granulocytes: 0.11 10*3/uL — ABNORMAL HIGH (ref 0.00–0.07)
Basophils Absolute: 0 10*3/uL (ref 0.0–0.1)
Basophils Relative: 0 %
Eosinophils Absolute: 0 10*3/uL (ref 0.0–0.5)
Eosinophils Relative: 0 %
HCT: 43.8 % (ref 39.0–52.0)
Hemoglobin: 14.9 g/dL (ref 13.0–17.0)
Immature Granulocytes: 1 %
Lymphocytes Relative: 5 %
Lymphs Abs: 0.5 10*3/uL — ABNORMAL LOW (ref 0.7–4.0)
MCH: 29 pg (ref 26.0–34.0)
MCHC: 34 g/dL (ref 30.0–36.0)
MCV: 85.4 fL (ref 80.0–100.0)
Monocytes Absolute: 1.1 10*3/uL — ABNORMAL HIGH (ref 0.1–1.0)
Monocytes Relative: 12 %
Neutro Abs: 7.5 10*3/uL (ref 1.7–7.7)
Neutrophils Relative %: 82 %
Platelets: 151 10*3/uL (ref 150–400)
RBC: 5.13 MIL/uL (ref 4.22–5.81)
RDW: 14.6 % (ref 11.5–15.5)
WBC: 9.2 10*3/uL (ref 4.0–10.5)
nRBC: 0 % (ref 0.0–0.2)

## 2022-05-25 LAB — MAGNESIUM: Magnesium: 1.9 mg/dL (ref 1.7–2.4)

## 2022-05-25 SURGERY — ESOPHAGOGASTRODUODENOSCOPY (EGD) WITH PROPOFOL
Anesthesia: General

## 2022-05-25 MED ORDER — GLUCAGON HCL RDNA (DIAGNOSTIC) 1 MG IJ SOLR
INTRAMUSCULAR | Status: AC
  Filled 2022-05-25: qty 1

## 2022-05-25 MED ORDER — LIDOCAINE 2% (20 MG/ML) 5 ML SYRINGE
INTRAMUSCULAR | Status: DC | PRN
  Administered 2022-05-25: 80 mg via INTRAVENOUS

## 2022-05-25 MED ORDER — ACETAMINOPHEN 160 MG/5ML PO SOLN: 325.0000 mg | ORAL | Status: DC | PRN

## 2022-05-25 MED ORDER — ONDANSETRON HCL 4 MG/2ML IJ SOLN
INTRAMUSCULAR | Status: DC | PRN
  Administered 2022-05-25: 4 mg via INTRAVENOUS

## 2022-05-25 MED ORDER — LACTATED RINGERS IV SOLN: INTRAVENOUS | Status: DC

## 2022-05-25 MED ORDER — FENTANYL CITRATE (PF) 250 MCG/5ML IJ SOLN
INTRAMUSCULAR | Status: DC | PRN
  Administered 2022-05-25: 25 ug via INTRAVENOUS

## 2022-05-25 MED ORDER — CIPROFLOXACIN IN D5W 400 MG/200ML IV SOLN
INTRAVENOUS | Status: AC
  Filled 2022-05-25: qty 200

## 2022-05-25 MED ORDER — ACETAMINOPHEN 325 MG PO TABS: 325.0000 mg | ORAL_TABLET | ORAL | Status: DC | PRN

## 2022-05-25 MED ORDER — FENTANYL CITRATE (PF) 100 MCG/2ML IJ SOLN
INTRAMUSCULAR | Status: AC
  Filled 2022-05-25: qty 2

## 2022-05-25 MED ORDER — DICLOFENAC SUPPOSITORY 100 MG
RECTAL | Status: DC | PRN
  Administered 2022-05-25: 100 mg via RECTAL

## 2022-05-25 MED ORDER — PHENOL 1.4 % MT LIQD
1.0000 | OROMUCOSAL | Status: DC | PRN
Start: 2022-05-25 — End: 2022-05-26

## 2022-05-25 MED ORDER — OXYCODONE HCL 5 MG/5ML PO SOLN: 5.0000 mg | Freq: Once | ORAL | Status: DC | PRN

## 2022-05-25 MED ORDER — ROCURONIUM BROMIDE 10 MG/ML (PF) SYRINGE
PREFILLED_SYRINGE | INTRAVENOUS | Status: DC | PRN
  Administered 2022-05-25: 50 mg via INTRAVENOUS

## 2022-05-25 MED ORDER — PHENYLEPHRINE 80 MCG/ML (10ML) SYRINGE FOR IV PUSH (FOR BLOOD PRESSURE SUPPORT)
PREFILLED_SYRINGE | INTRAVENOUS | Status: DC | PRN
  Administered 2022-05-25 (×5): 80 ug via INTRAVENOUS

## 2022-05-25 MED ORDER — MEPERIDINE HCL 25 MG/ML IJ SOLN: 6.2500 mg | INTRAMUSCULAR | Status: DC | PRN

## 2022-05-25 MED ORDER — SUGAMMADEX SODIUM 200 MG/2ML IV SOLN
INTRAVENOUS | Status: DC | PRN
  Administered 2022-05-25: 200 mg via INTRAVENOUS

## 2022-05-25 MED ORDER — DICLOFENAC SUPPOSITORY 100 MG
RECTAL | Status: AC
  Filled 2022-05-25: qty 1

## 2022-05-25 MED ORDER — ONDANSETRON HCL 4 MG/2ML IJ SOLN: 4.0000 mg | Freq: Once | INTRAMUSCULAR | Status: DC | PRN

## 2022-05-25 MED ORDER — CIPROFLOXACIN IN D5W 400 MG/200ML IV SOLN
INTRAVENOUS | Status: AC | PRN
  Administered 2022-05-25: 400 mg via INTRAVENOUS

## 2022-05-25 MED ORDER — SODIUM CHLORIDE 0.9 % IV SOLN
INTRAVENOUS | Status: DC | PRN
  Administered 2022-05-25: 15 mL

## 2022-05-25 MED ORDER — LACTATED RINGERS IV SOLN: INTRAVENOUS | Status: DC | PRN

## 2022-05-25 MED ORDER — PROPOFOL 10 MG/ML IV BOLUS
INTRAVENOUS | Status: DC | PRN
  Administered 2022-05-25: 100 mg via INTRAVENOUS

## 2022-05-25 MED ORDER — OXYCODONE HCL 5 MG PO TABS: 5.0000 mg | ORAL_TABLET | Freq: Once | ORAL | Status: DC | PRN

## 2022-05-25 MED ORDER — FENTANYL CITRATE (PF) 100 MCG/2ML IJ SOLN
25.0000 ug | INTRAMUSCULAR | Status: DC | PRN
  Administered 2022-05-25 (×2): 50 ug via INTRAVENOUS

## 2022-05-25 SURGICAL SUPPLY — 15 items
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
## 2022-05-25 NOTE — Transfer of Care (Signed)
Immediate Anesthesia Transfer of Care Note  Patient: [name]  Procedure(s) Performed: ESOPHAGOGASTRODUODENOSCOPY (EGD) WITH PROPOFOL UPPER ESOPHAGEAL ENDOSCOPIC ULTRASOUND (EUS) (Left) FINE NEEDLE ASPIRATION (FNA) LINEAR ENDOSCOPIC RETROGRADE CHOLANGIOPANCREATOGRAPHY (ERCP) SPHINCTEROTOMY BILIARY STENT PLACEMENT  Patient Location: PACU  Anesthesia Type:General  Level of Consciousness: awake, alert  and responds to stimulation  Airway & Oxygen Therapy: Patient Spontanous Breathing and Patient connected to face mask oxygen  Post-op Assessment: Report given to RN and Post -op Vital signs reviewed and stable  Post vital signs: Reviewed and stable  Last Vitals:  Vitals Value Taken Time  BP 137/81 05/25/22 0955  Temp 36.4 C 05/25/22 0955  Pulse 92 05/25/22 0959  Resp 21 05/25/22 0959  SpO2 93 % 05/25/22 0959  Vitals shown include unvalidated device data.  Last Pain:  Vitals:   05/25/22 0705  TempSrc: Temporal  PainSc: 0-No pain      Patients Stated Pain Goal: 1 (96/43/83 8184)  Complications: No notable events documented.

## 2022-05-25 NOTE — Interval H&P Note (Signed)
History and Physical Interval Note:  05/25/2022 7:42 AM  [name]  has presented today for surgery, with the diagnosis of CBD obstruction probable pancreatic mass.  The various methods of treatment have been discussed with the patient and family. After consideration of risks, benefits and other options for treatment, the patient has consented to  Procedure(s): ESOPHAGOGASTRODUODENOSCOPY (EGD) WITH PROPOFOL (N/A) UPPER ESOPHAGEAL ENDOSCOPIC ULTRASOUND (EUS) (Left) ENDOSCOPIC RETROGRADE CHOLANGIOPANCREATOGRAPHY (ERCP) (N/A) as a surgical intervention.  The patient's history has been reviewed, patient examined, no change in status, stable for surgery.  I have reviewed the patient's chart and labs.  Questions were answered to the patient's satisfaction.     Landry Dyke

## 2022-05-25 NOTE — Progress Notes (Signed)
Pt reminded of POC, npo status, verbalizes understanding, all needs addressed

## 2022-05-25 NOTE — Progress Notes (Signed)
Received in basket message from Dr. Alvy Bimler that patient needs consult with GI Onc.  Referral placed.
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
## 2022-05-25 NOTE — Op Note (Signed)
[facility] Patient Name: [name] Procedure Date : 05/25/2022 MRN: [id number] Attending MD: Arta Silence , MD Date of Birth: 27-Oct-1946 CSN: [id number] Age: 76 Admit Type: Inpatient Procedure:                Upper EUS Indications:              Common bile duct dilation (acquired) seen on MRI,                            Suspected mass in pancreas on MRI, Elevated liver                            enzymes Providers:                Arta Silence, MD, Dulcy Fanny, Cherylynn Ridges, Technician, Tressia Miners, CRNA Referring MD:             [facility] Medicines:                General Anesthesia Complications:            No immediate complications. Estimated Blood Loss:     Estimated blood loss: none. Procedure:                Pre-Anesthesia Assessment:                           - Prior to the procedure, a History and Physical                            was performed, and patient medications and                            allergies were reviewed. The patient's tolerance of                            previous anesthesia was also reviewed. The risks                            and benefits of the procedure and the sedation                            options and risks were discussed with the patient.                            All questions were answered, and informed consent                            was obtained. Prior Anticoagulants: The patient has                            taken no previous anticoagulant or antiplatelet  agents. ASA Grade Assessment: III - A patient with                            severe systemic disease. After reviewing the risks                            and benefits, the patient was deemed in                            satisfactory condition to undergo the procedure.                           After obtaining informed consent, the endoscope was                            passed  under direct vision. Throughout the                            procedure, the patient's blood pressure, pulse, and                            oxygen saturations were monitored continuously. The                            [id number] ([id number] Olympus linear ultrasound scope                            was introduced through the mouth, and advanced to                            the second part of duodenum. The upper EUS was                            accomplished without difficulty. The patient                            tolerated the procedure well. Scope In: Scope Out: Findings:      ENDOSONOGRAPHIC FINDING: :      Minimal hyperechoic material consistent with sludge was visualized       endosonographically in the common hepatic duct, where the hepatic duct       bifurcates into the right and left hepatic ducts and in the gallbladder.       No abnormalities seen in body and tail of pancreas. No pancreatic ductal       dilatation.      The distal common bile duct and ampulla was normal, at which point there       was an irregular mass was identified in the pancreatic head with       immediate and significant upstream biliary ductal dilatation. The mass       was hypoechoic. The mass measured 12 mm by 15 mm in maximal       cross-sectional diameter. Very difficult to discern margins of this       area, thus hard to determine any vascular involvement. The  endosonographic borders were poorly-defined. The remainder of the       pancreas was examined. The endosonographic appearance of parenchyma and       the upstream pancreatic duct indicated no duct dilation. Fine needle       aspiration for cytology was performed. Color Doppler imaging was       utilized prior to needle puncture to confirm a lack of significant       vascular structures within the needle path. Four passes were made with       the 25 gauge needle using a transduodenal approach. A stylet was used. A       cytologist was  present and performed a preliminary cytologic       examination. The cellularity of the specimen was adequate. Final       cytology results are pending.      No lymphadenopathy seen. Impression:               - Hyperechoic material consistent with sludge was                            visualized endosonographically in the common                            hepatic duct, in the bifurcation of the common                            hepatic duct and in the gallbladder.                           - Normal ampulla and distal CBD.                           - A mass was identified in the pancreatic head                            causing upstream common hepatic biliary ductal                            dilatation, sludge and gallbladder distention. This                            was staged T2 N0 Mx by endosonographic criteria.                            Fine needle aspiration performed. Recommendation:           - Perform an ERCP today. Procedure Code(s):        --- Professional ---                           [phone], Esophagogastroduodenoscopy, flexible,                            transoral; with transendoscopic ultrasound-guided                            intramural or transmural fine needle  aspiration/biopsy(s) (includes endoscopic                            ultrasound examination of the esophagus, stomach,                            and either the duodenum or a surgically altered                            stomach where the jejunum is examined distal to the                            anastomosis) Diagnosis Code(s):        --- Professional ---                           K83.8, Other specified diseases of biliary tract                           K86.89, Other specified diseases of pancreas                           R74.8, Abnormal levels of other serum enzymes                           R93.3, Abnormal findings on diagnostic imaging of                            other parts  of digestive tract CPT copyright 2019 [employer]. All rights reserved. The codes documented in this report are preliminary and upon coder review may  be revised to meet current compliance requirements. Arta Silence, MD 05/25/2022 9:16:19 AM This report has been signed electronically. Number of Addenda: 0

## 2022-05-25 NOTE — Anesthesia Postprocedure Evaluation (Signed)
Anesthesia Post Note  Patient: [name]  Procedure(s) Performed: ESOPHAGOGASTRODUODENOSCOPY (EGD) WITH PROPOFOL UPPER ESOPHAGEAL ENDOSCOPIC ULTRASOUND (EUS) (Left) FINE NEEDLE ASPIRATION (FNA) LINEAR ENDOSCOPIC RETROGRADE CHOLANGIOPANCREATOGRAPHY (ERCP) [place]     Patient location during evaluation: PACU Anesthesia Type: General Level of consciousness: awake and alert Pain management: pain level controlled Vital Signs Assessment: post-procedure vital signs reviewed and stable Respiratory status: spontaneous breathing, nonlabored ventilation, respiratory function stable and patient connected to nasal cannula oxygen Cardiovascular status: blood pressure returned to baseline and stable Postop Assessment: no apparent nausea or vomiting Anesthetic complications: no   No notable events documented.  Last Vitals:  Vitals:   05/25/22 1045 05/25/22 1110  BP: 128/79 139/61  Pulse: 79 78  Resp: 18 18  Temp: 36.6 C 36.8 C  SpO2: 94% 98%    Last Pain:  Vitals:   05/25/22 1110  TempSrc: Oral  PainSc:                  [place]

## 2022-05-25 NOTE — Op Note (Signed)
[facility] Patient Name: [name] Procedure Date : 05/25/2022 MRN: [id number] Attending MD: Clarene Essex , MD Date of Birth: Jul 26, 1946 CSN: [id number] Age: 76 Admit Type: Inpatient Procedure:                ERCP Indications:              Abnormal abdominal CT, Abnormal MRCP, Jaundice,                            Malignant tumor of the head of pancreas versus                            cholangio per EUS Providers:                Clarene Essex, MD, Dulcy Fanny, Janie Billups,                            Technician, Tressia Miners, CRNA Referring MD:              Medicines:                General Anesthesia Complications:            No immediate complications. Estimated Blood Loss:     Estimated blood loss: none. Procedure:                Pre-Anesthesia Assessment:                           - Prior to the procedure, a History and Physical                            was performed, and patient medications and                            allergies were reviewed. The patient's tolerance of                            previous anesthesia was also reviewed. The risks                            and benefits of the procedure and the sedation                            options and risks were discussed with the patient.                            All questions were answered, and informed consent                            was obtained. Prior Anticoagulants: The patient has                            taken no previous anticoagulant or antiplatelet                            agents. ASA  Grade Assessment: III - A patient with                            severe systemic disease. After reviewing the risks                            and benefits, the patient was deemed in                            satisfactory condition to undergo the procedure.                           - Prior to the procedure, a History and Physical                            was performed, and patient medications  and                            allergies were reviewed. The patient's tolerance of                            previous anesthesia was also reviewed. The risks                            and benefits of the procedure and the sedation                            options and risks were discussed with the patient.                            All questions were answered, and informed consent                            was obtained. Prior Anticoagulants: The patient has                            taken Lovenox (enoxaparin), last dose was 1 day                            prior to procedure. ASA Grade Assessment: III - A                            patient with severe systemic disease. After                            reviewing the risks and benefits, the patient was                            deemed in satisfactory condition to undergo the                            procedure.  After obtaining informed consent, the scope was                            passed under direct vision. Throughout the                            procedure, the patient's blood pressure, pulse, and                            oxygen saturations were monitored continuously. The                            [id number]-[id number] ([id number] Olympus duodenoscope was                            introduced through the mouth, and used to inject                            contrast into and used to cannulate the bile duct.                            The ERCP was accomplished without difficulty. The                            patient tolerated the procedure well. Scope In: Scope Out: Findings:      The major papilla was normal. Deep selective cannulation was readily       obtained and an obvious mid duct short stricture was confirmed on       initial injection of the dilated segment above it and we proceeded with       a small biliary sphincterotomy was made with a Hydratome sphincterotome       using ERBE electrocautery.  There was no post-sphincterotomy bleeding.       Our one third bowed sphincterotome could get easily in and out of the       duct and we did have some biliary drainage and we placed one 10 Fr by 6       cm uncovered metal stent with no external flaps and no internal flaps       was placed 5.5 cm into the common bile duct. Dirty bile flowed through       the stent. The stent was in good position. The introducer and wire were       removed and the scope was removed and the patient tolerated the       procedure well and there was no pancreatic duct injection or wire       advancement throughout the procedure Impression:               - The major papilla appeared normal.                           - A biliary sphincterotomy was performed.                           - One uncovered metal stent was placed into the  common bile duct. Recommendation:           - Clear liquid diet for 6 hours. If doing well this                            evening may have soft solid                           - Continue present medications.                           - Return to GI clinic PRN. Follow liver tests back                            to near normal over the next week which can be done                            as an outpatient                           - Telephone GI clinic for pathology results in 5                            days.                           - Telephone GI clinic if symptomatic PRN.                           - Refer to an oncologist today per hospital team. Procedure Code(s):        --- Professional ---                           [phone], Endoscopic retrograde                            cholangiopancreatography (ERCP); with placement of                            endoscopic stent into biliary or pancreatic duct,                            including pre- and post-dilation and guide wire                            passage, when performed, including sphincterotomy,                             when performed, each stent Diagnosis Code(s):        --- Professional ---                           R17, Unspecified jaundice                           C25.0, Malignant neoplasm of head of  pancreas                           R93.5, Abnormal findings on diagnostic imaging of                            other abdominal regions, including retroperitoneum                           R93.2, Abnormal findings on diagnostic imaging of                            liver and biliary tract CPT copyright 2019 [employer]. All rights reserved. The codes documented in this report are preliminary and upon coder review may  be revised to meet current compliance requirements. Clarene Essex, MD 05/25/2022 9:49:52 AM This report has been signed electronically. Number of Addenda: 0

## 2022-05-26 ENCOUNTER — Telehealth: Payer: Self-pay | Admitting: Physician Assistant

## 2022-05-26 DIAGNOSIS — C259 Malignant neoplasm of pancreas, unspecified: Secondary | ICD-10-CM

## 2022-05-26 LAB — CYTOLOGY - NON PAP

## 2022-05-26 LAB — CBC WITH DIFFERENTIAL/PLATELET
Abs Immature Granulocytes: 0.08 10*3/uL — ABNORMAL HIGH (ref 0.00–0.07)
Basophils Absolute: 0 10*3/uL (ref 0.0–0.1)
Basophils Relative: 0 %
Eosinophils Absolute: 0 10*3/uL (ref 0.0–0.5)
Eosinophils Relative: 0 %
HCT: 41.1 % (ref 39.0–52.0)
Hemoglobin: 14 g/dL (ref 13.0–17.0)
Immature Granulocytes: 1 %
Lymphocytes Relative: 9 %
Lymphs Abs: 0.6 10*3/uL — ABNORMAL LOW (ref 0.7–4.0)
MCH: 28.9 pg (ref 26.0–34.0)
MCHC: 34.1 g/dL (ref 30.0–36.0)
MCV: 84.7 fL (ref 80.0–100.0)
Monocytes Absolute: 0.8 10*3/uL (ref 0.1–1.0)
Monocytes Relative: 11 %
Neutro Abs: 5.5 10*3/uL (ref 1.7–7.7)
Neutrophils Relative %: 79 %
Platelets: 183 10*3/uL (ref 150–400)
RBC: 4.85 MIL/uL (ref 4.22–5.81)
RDW: 15 % (ref 11.5–15.5)
WBC: 7 10*3/uL (ref 4.0–10.5)
nRBC: 0 % (ref 0.0–0.2)

## 2022-05-26 LAB — COMPREHENSIVE METABOLIC PANEL
ALT: 337 U/L — ABNORMAL HIGH (ref 0–44)
AST: 252 U/L — ABNORMAL HIGH (ref 15–41)
Albumin: 2.5 g/dL — ABNORMAL LOW (ref 3.5–5.0)
Alkaline Phosphatase: 348 U/L — ABNORMAL HIGH (ref 38–126)
Anion gap: 12 (ref 5–15)
BUN: 18 mg/dL (ref 8–23)
CO2: 22 mmol/L (ref 22–32)
Calcium: 8.6 mg/dL — ABNORMAL LOW (ref 8.9–10.3)
Chloride: 102 mmol/L (ref 98–111)
Creatinine, Ser: 1.42 mg/dL — ABNORMAL HIGH (ref 0.61–1.24)
GFR, Estimated: 51 mL/min — ABNORMAL LOW (ref >60)
Glucose, Bld: 126 mg/dL — ABNORMAL HIGH (ref 70–99)
Potassium: 3.8 mmol/L (ref 3.5–5.1)
Sodium: 136 mmol/L (ref 135–145)
Total Bilirubin: 6.5 mg/dL — ABNORMAL HIGH (ref 0.3–1.2)
Total Protein: 5.7 g/dL — ABNORMAL LOW (ref 6.5–8.1)

## 2022-05-26 LAB — MAGNESIUM: Magnesium: 2.1 mg/dL (ref 1.7–2.4)

## 2022-05-26 LAB — BASIC METABOLIC PANEL
Anion gap: 11 (ref 5–15)
BUN: 19 mg/dL (ref 8–23)
CO2: 21 mmol/L — ABNORMAL LOW (ref 22–32)
Calcium: 8.4 mg/dL — ABNORMAL LOW (ref 8.9–10.3)
Chloride: 105 mmol/L (ref 98–111)
Creatinine, Ser: 1.25 mg/dL — ABNORMAL HIGH (ref 0.61–1.24)
GFR, Estimated: 59 mL/min — ABNORMAL LOW (ref >60)
Glucose, Bld: 133 mg/dL — ABNORMAL HIGH (ref 70–99)
Potassium: 3.5 mmol/L (ref 3.5–5.1)
Sodium: 137 mmol/L (ref 135–145)

## 2022-05-26 NOTE — Care Management Important Message (Signed)
Important Message  Patient Details  Name: [name] MRN: [id number] Date of Birth: Mar 08, 1946   Medicare Important Message Given:  Yes     Ahsha Hinsley Montine Circle 05/26/2022, 3:53 PM

## 2022-05-26 NOTE — Plan of Care (Signed)
  Problem: Education: Goal: Knowledge of General Education information will improve Description: Including pain rating scale, medication(s)/side effects and non-pharmacologic comfort measures 05/26/2022 1547 by Santa Lighter, RN Outcome: Adequate for Discharge 05/26/2022 0826 by Santa Lighter, RN Outcome: Progressing   Problem: Health Behavior/Discharge Planning: Goal: Ability to manage health-related needs will improve 05/26/2022 1547 by Santa Lighter, RN Outcome: Adequate for Discharge 05/26/2022 0826 by Santa Lighter, RN Outcome: Progressing   Problem: Clinical Measurements: Goal: Ability to maintain clinical measurements within normal limits will improve 05/26/2022 1547 by Santa Lighter, RN Outcome: Adequate for Discharge 05/26/2022 0826 by Santa Lighter, RN Outcome: Progressing Goal: Will remain free from infection Outcome: Adequate for Discharge Goal: Diagnostic test results will improve Outcome: Adequate for Discharge Goal: Respiratory complications will improve Outcome: Adequate for Discharge Goal: Cardiovascular complication will be avoided Outcome: Adequate for Discharge   Problem: Activity: Goal: Risk for activity intolerance will decrease Outcome: Adequate for Discharge   Problem: Nutrition: Goal: Adequate nutrition will be maintained Outcome: Adequate for Discharge   Problem: Coping: Goal: Level of anxiety will decrease Outcome: Adequate for Discharge   Problem: Elimination: Goal: Will not experience complications related to bowel motility Outcome: Adequate for Discharge Goal: Will not experience complications related to urinary retention Outcome: Adequate for Discharge   Problem: Pain Managment: Goal: General experience of comfort will improve Outcome: Adequate for Discharge   Problem: Safety: Goal: Ability to remain free from injury will improve Outcome: Adequate for Discharge   Problem: Skin Integrity: Goal: Risk for impaired skin integrity  will decrease Outcome: Adequate for Discharge

## 2022-05-26 NOTE — Progress Notes (Signed)
Nsg Discharge Note  Admit Date:  05/21/2022 Discharge date: 05/26/2022   [name] to be D/C'd Home per MD order.  AVS completed.   Removed IV-CDI. Reviewed d/c paperwork with patient and family. Answered all questions. NT wheeled stable patient and belongings to main entrance where he was picked up by his family. Patient/caregiver able to verbalize understanding.  Discharge Medication: Allergies as of 05/26/2022   No Known Allergies      Medication List     STOP taking these medications    bisoprolol-hydrochlorothiazide 10-6.25 MG tablet Commonly known as: ZIAC   lisinopril 5 MG tablet Commonly known as: ZESTRIL       TAKE these medications    Accu-Chek Guide test strip Generic drug: glucose blood USE TO check blood sugar DAILY AS DIRECTED   albuterol 108 (90 Base) MCG/ACT inhaler Commonly known as: VENTOLIN HFA Inhale 2 puffs in the lungs every 4 hours as needed for cough, wheezing, or SOB.   BC Fast Pain Relief Max Str 500-500-65 MG Pack Generic drug: Aspirin-Acetaminophen-Caffeine Take 1 packet by mouth daily as needed (pain).   finasteride 5 MG tablet Commonly known as: PROSCAR Take 5 mg by mouth daily as needed (prostate).   FreeStyle Freedom Lite w/Device Kit 1 each by Does not apply route daily at 2 am. What changed: additional instructions   Lancets Misc Test blood sugar daily. Dx code: 250.00   multivitamin capsule Take 1 capsule by mouth daily.   nitroGLYCERIN 0.4 MG SL tablet Commonly known as: NITROSTAT If you need to use this medication please inform your cardiologist and Dr. Daub.   Osteo Bi-Flex Triple Strength Tabs Take 1 tablet by mouth daily.   oxymetazoline 0.05 % nasal spray Commonly known as: AFRIN Place 1 spray into both nostrils 2 (two) times daily as needed for congestion.   pantoprazole 40 MG tablet Commonly known as: PROTONIX Take 1 tablet (40 mg total) by mouth daily.   silodosin 8 MG Caps capsule Commonly known  as: RAPAFLO Take 8 mg by mouth daily.   simvastatin 20 MG tablet Commonly known as: ZOCOR TAKE ONE TABLET BY MOUTH EVERYDAY AT BEDTIME What changed:  how much to take how to take this when to take this additional instructions   SYSTANE COMPLETE OP Apply 1 drop to eye daily as needed (irritation).   TYLENOL ARTHRITIS PAIN PO Take 650 mg by mouth daily as needed (pain).        Discharge Assessment: Vitals:   05/26/22 0546 05/26/22 0751  BP: 111/61 131/87  Pulse: 73 79  Resp: 17 16  Temp: 98.5 F (36.9 C) 98.5 F (36.9 C)  SpO2: 98% 98%   Skin clean, dry and intact without evidence of skin break down, no evidence of skin tears noted. IV catheter discontinued intact. Site without signs and symptoms of complications - no redness or edema noted at insertion site, patient denies c/o pain - only slight tenderness at site.  Dressing with slight pressure applied.  D/c Instructions-Education: Discharge instructions given to patient/family with verbalized understanding. D/c education completed with patient/family including follow up instructions, medication list, d/c activities limitations if indicated, with other d/c instructions as indicated by MD - patient able to verbalize understanding, all questions fully answered. Patient instructed to return to ED, call 911, or call MD for any changes in condition.  Patient escorted via WC, and D/C home via private auto.  Shannon  Brown, RN 05/26/2022 4:05 PM  

## 2022-05-26 NOTE — Plan of Care (Signed)

## 2022-05-26 NOTE — Progress Notes (Signed)
[facility] Progress Note  Egor D Altidor 76 y.o. 01/28/1946  CC:  Pancreatic mass   Subjective: Patient states he is feeling good today. Denies abdominal pain, nausea, and vomiting. States he has been having to use the bathroom every 30 minutes and feels his miralax dosage is too high. Would like to eat something more than clear liquids and would like to go home if possible.  ROS : Review of Systems  Constitutional:  Negative for chills, fever and weight loss.  Gastrointestinal:  Negative for abdominal pain, blood in stool, constipation, diarrhea, heartburn, melena, nausea and vomiting.      Objective: Vital signs in last 24 hours: Vitals:   05/26/22 0546 05/26/22 0751  BP: 111/61 131/87  Pulse: 73 79  Resp: 17 16  Temp: 98.5 F (36.9 C) 98.5 F (36.9 C)  SpO2: 98% 98%    Physical Exam:  General:  Alert, cooperative, no distress, appears stated age  Head:  Normocephalic, without obvious abnormality, atraumatic  Eyes:  icteric sclera, EOM's intact  Lungs:   Clear to auscultation bilaterally, respirations unlabored  Heart:  Regular rate and rhythm, S1, S2 normal  Abdomen:   Soft, non-tender, bowel sounds active all four quadrants,  no masses,     Lab Results: Recent Labs    05/25/22 0022 05/26/22 0244  NA 137 136  K 4.3 3.8  CL 105 102  CO2 21* 22  GLUCOSE 176* 126*  BUN 11 18  CREATININE 1.02 1.42*  CALCIUM 8.9 8.6*  MG 1.9 2.1   Recent Labs    05/25/22 0022 05/26/22 0244  AST 405* 252*  ALT 433* 337*  ALKPHOS 362* 348*  BILITOT 9.3* 6.5*  PROT 5.6* 5.7*  ALBUMIN 2.7* 2.5*   Recent Labs    05/25/22 0022 05/26/22 0244  WBC 9.2 7.0  NEUTROABS 7.5 5.5  HGB 14.9 14.0  HCT 43.8 41.1  MCV 85.4 84.7  PLT 151 183   No results for input(s): "LABPROT", "INR" in the last 72 hours.    Assessment Obstructive jaundice, suspected pancreatic head/neck mass on imaging, elevated LFTs -AST 252/ALT 337/alk phos 348, trending down -T. bili  6.5 (9.3) -No leukocytosis -MRCP 7/8 shows possible soft tissue mass around head/neck of pancreas and common bile duct obstruction as well as cholelithiasis -ERCP and EUS yesterday, biopsies pending   Plan: Biopsies pending from procedure yesterday.  LFTs beginning to trend down.  Abdominal pain has improved. Continue to trend LFTs to normalization.  Continue follow-up with oncologist on Friday. Can advance diet to full liquids and then soft as tolerated From a GI standpoint patient can be discharged and will follow-up on biopsies. [facility] will sign off. Please contact us if we can be of any further assistance during this hospital stay.   Bayley M McMichael PA-C 05/26/2022, 10:10 AM  Contact #  [phone]  

## 2022-05-26 NOTE — Telephone Encounter (Signed)
Scheduled appt per 7/11 staff msg from Dr. Burr Medico. Pt is aware of appt date and time. Pt is aware to arrive 15 mins prior to appt time and to bring and updated insurance card. Pt is aware of appt location.

## 2022-05-26 NOTE — Progress Notes (Signed)
1 Day Post-Op  Subjective: Bilirubin up to 9.7 today. Patient reports continued epigastric pain but says it has improved since admission. Afebrile.  Objective: Vital signs in last 24 hours: Temp:  [97.7 F (36.5 C)-98.6 F (37 C)] 98.5 F (36.9 C) (07/12 0751) Pulse Rate:  [73-84] 79 (07/12 0751) Resp:  [16-18] 16 (07/12 0751) BP: (111-139)/(61-87) 131/87 (07/12 0751) SpO2:  [98 %-100 %] 98 % (07/12 0751) Last BM Date : 05/26/22  Intake/Output from previous day: 07/11 0701 - 07/12 0700 In: 1991.4 [I.V.:1991.4] Out: 225 [Urine:225] Intake/Output this shift: No intake/output data recorded.  PE: General: resting comfortably, NAD Neuro: alert and oriented, no focal deficits Resp: normal work of breathing Abdomen: soft, nondistended, nontender to palpation Extremities: warm and well-perfused   Lab Results:  Recent Labs    05/25/22 0022 05/26/22 0244  WBC 9.2 7.0  HGB 14.9 14.0  HCT 43.8 41.1  PLT 151 183   BMET Recent Labs    05/25/22 0022 05/26/22 0244  NA 137 136  K 4.3 3.8  CL 105 102  CO2 21* 22  GLUCOSE 176* 126*  BUN 11 18  CREATININE 1.02 1.42*  CALCIUM 8.9 8.6*   PT/INR No results for input(s): "LABPROT", "INR" in the last 72 hours. CMP     Component Value Date/Time   NA 136 05/26/2022 0244   K 3.8 05/26/2022 0244   CL 102 05/26/2022 0244   CO2 22 05/26/2022 0244   GLUCOSE 126 (H) 05/26/2022 0244   BUN 18 05/26/2022 0244   CREATININE 1.42 (H) 05/26/2022 0244   CREATININE 1.28 (H) 07/31/2020 1001   CALCIUM 8.6 (L) 05/26/2022 0244   PROT 5.7 (L) 05/26/2022 0244   ALBUMIN 2.5 (L) 05/26/2022 0244   AST 252 (H) 05/26/2022 0244   ALT 337 (H) 05/26/2022 0244   ALKPHOS 348 (H) 05/26/2022 0244   BILITOT 6.5 (H) 05/26/2022 0244   GFRNONAA 51 (L) 05/26/2022 0244   GFRNONAA 54 (L) 07/31/2020 1001   GFRAA 63 07/31/2020 1001   Lipase     Component Value Date/Time   LIPASE 35.0 05/21/2022 1211       Studies/Results: CT CHEST WO  CONTRAST  Result Date: 05/25/2022 CLINICAL DATA:  Pancreatic cancer, for staging EXAM: CT CHEST WITHOUT CONTRAST TECHNIQUE: Multidetector CT imaging of the chest was performed following the standard protocol without IV contrast. RADIATION DOSE REDUCTION: This exam was performed according to the departmental dose-optimization program which includes automated exposure control, adjustment of the mA and/or kV according to patient size and/or use of iterative reconstruction technique. COMPARISON:  Partial comparison to CT abdomen/pelvis dated 05/22/2022 FINDINGS: Cardiovascular: The heart is normal in size. No pericardial effusion. No evidence of thoracic aortic aneurysm. Mild atherosclerotic calcifications of the aortic arch. Mediastinum/Nodes: No suspicious mediastinal lymphadenopathy. Visualized thyroid is unremarkable. Lungs/Pleura: Volume loss in the left hemithorax. Trace right pleural effusion. Associated right lower lobe scarring/atelectasis. Mild centrilobular emphysematous changes, upper lung predominant. Mild subpleural scarring in the left hemithorax with overlying chest wall deformity (described below). No suspicious pulmonary nodules. No pneumothorax. Upper Abdomen: Tiny layering gallstone with pericholecystic inflammatory changes. Indwelling common duct stent. Otherwise, better evaluated on recent CT abdomen/pelvis. Musculoskeletal: Degenerative changes of the thoracic spine. Multiple left rib deformities with ORIF. Displacement of the left clavicular head, likely chronic. IMPRESSION: No evidence of metastatic disease in the chest. Additional ancillary findings in the left chest and upper abdomen, as above. Aortic Atherosclerosis (ICD10-I70.0) and Emphysema (ICD10-J43.9). Electronically Signed   By: Bertis Ruddy  [place] Pink M.D.   On: 05/25/2022 23:54   DG ERCP  Result Date: 05/25/2022 CLINICAL DATA:  Acute cholecystitis. EXAM: ERCP COMPARISON:  CT AP and MRCP 05/22/2022. FLUOROSCOPY: Exposure Index (as  provided by the fluoroscopic device): 40.8 mGy Kerma FINDINGS: Multiple, limited oblique planar images of the RIGHT upper quadrant obtained C-arm. Images demonstrating flexible endoscopy, biliary duct cannulation, retrograde cholangiogram and metallic biliary stent placement. Common bile duct focal narrowing suspicious for mass compression versus stricture IMPRESSION: Fluoroscopic imaging for ERCP and metallic biliary stent placement. CBD narrowing suspicious for stricture versus mass compression. For complete description of intra procedural findings, please see performing service dictation. Electronically Signed   By: Michaelle Birks M.D.   On: 05/25/2022 10:34        Assessment/Plan 76 yo male presenting with abdominal pain and obstructive jaundice. EUS yesterday showed a T2N0 pancreatic mass, uncovered metal stent placed. Tbili downtrending today and patient denies abdominal pain. FNA is consistent with adenocarcinoma. This appears resectable via a Whipple. I will have patient follow up in the office next week to discuss surgery once jaundice improves. Plan was discussed with him at bedside and our office will contact him to schedule an appointment.    LOS: 4 days    Michaelle Birks, MD [facility] Surgery General, Hepatobiliary and Pancreatic Surgery 05/26/22 11:03 AM

## 2022-05-27 ENCOUNTER — Other Ambulatory Visit: Payer: Self-pay | Admitting: Genetic Counselor

## 2022-05-27 DIAGNOSIS — Z1379 Encounter for other screening for genetic and chromosomal anomalies: Secondary | ICD-10-CM

## 2022-05-27 LAB — CANCER ANTIGEN 19-9: CA 19-9: <2 U/mL (ref 0–35)

## 2022-05-27 NOTE — Progress Notes (Unsigned)
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 day as needed for abdominal pain which typically controls his pain. -LFTs downtrending today.  Bilirubin improved from 6.5 on 7/12 to 2.0 today (7/18).  -Advise he can use Imodium if needed for loose stool.  Stool color and urine color returning closer to normal.  4. Social  -Patient lives in a house with his wife who has medical problems (Parkinson's disease).  He helps care for her. -They have a ramp to get up the steps of the house.  They have a second floor but did not use this "bonus" room. -Referral to social work to see what social benefits he may qualify for.  He may need transportation assistance.  5. Comorbidities (HTN, diet controlled prediabetes, lower extremity swelling, and CKD) -The patient was previously on lisinopril which was discontinued during his hospitalization -His blood pressure is elevated today.  He sees his PCP later this week. -Advised the patient to check his blood pressure once a day and keep a log of his readings until he sees his PCP later this week who could discuss restarting antihypertensive management based on his blood pressure trends. -Patient carries the diagnosis of CKD.   Creatinine today improved to 1.07. -The patient has bilateral lower extremity swelling.  Advised to elevate his legs and use compression stockings.  Dr. Burr Medico encouraged him to use compression stockings that are knee-high. -Last A1c 6.3 from March 2023.  Diet controlled.   Plan: -Repeat CBC and CMP today -We will arrange for chemo education class -Compazine, Zofran, and Emla cream sent to the pharmacy -Follow-up with Dr. Zenia Resides tomorrow on 06/02/2022. -We will see back for follow-up visit in approximately 2 weeks or so (after Port-A-Cath placement) with first dose of chemotherapy.     Orders Placed This Encounter  Procedures   CBC with Differential ([facility] Only)    Standing Status:   Future    Number of Occurrences:   1    Standing Expiration Date:   06/02/2023   CMP ([facility] only)    Standing Status:   Future  Number of Occurrences:   1    Standing Expiration Date:   06/02/2023   CMP ([facility] only)    Standing Status:   Future    Standing Expiration Date:   06/02/2023   CBC with Differential ([facility] Only)    Standing Status:   Future    Standing Expiration Date:   06/02/2023   Ambulatory referral to Social Work    Referral Priority:   Routine    Referral Type:   Consultation    Referral Reason:   Specialty Services Required    Number of Visits Requested:   [place], PA-C 06/01/22  Addendum  I have seen the patient, examined him. I agree with the assessment and and plan and have edited the notes.   I saw Mr. [name] in the hospital week, and he is here for follow-up.  I reviewed his images and biopsy results with him and his son. We discussed his pancreatic cancer is resectable, Dr. Zenia Resides will offer him before surgery for resection, but I also agree with neoadjuvant before surgery.  I discussed the high risk of recurrence from pancreatic cancer after surgical resection, and the benefit of chemotherapy.  I recommended  neoadjuvant chemotherapy because this is more tolerable.  I discussed the option of FOLFIRINOX and gemcitabine/Abraxane.  He is 76 year old, but overall in good performance status, he is agreeable to try FOLFIRINOX.  Potential side effect from chemo and management were discussed in detail.  He is agreeable to proceed.  I will ask Dr. Zenia Resides to place an port. Plan to start in 1-2 weeks. Will repeat lab today including genetics.   Truitt Merle MD  06/01/2022
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
## 2022-05-28 ENCOUNTER — Telehealth: Payer: Self-pay

## 2022-05-28 ENCOUNTER — Other Ambulatory Visit: Payer: Medicare Other

## 2022-05-28 ENCOUNTER — Telehealth: Payer: Self-pay | Admitting: Internal Medicine

## 2022-05-28 NOTE — Telephone Encounter (Signed)
Pt stated he received a message yesterday to give our office a call and ask for [facility]. Pt is requesting a callback.   CB: [phone]

## 2022-05-28 NOTE — Telephone Encounter (Signed)
Transition Care Management Follow-up Telephone Call Date of discharge and from where: [facility] on 05/21/2022 Dx: Pancreatic Mass How have you been since you were released from the hospital? "Worried" Any questions or concerns? Yes  Items Reviewed: Did the pt receive and understand the discharge instructions provided? Yes  Medications obtained and verified? Yes  Other? No  Any new allergies since your discharge? No  Dietary orders reviewed? No Do you have support at home? Yes   Home Care and Equipment/Supplies: Were home health services ordered? no If so, what is the name of the agency? no  Has the agency set up a time to come to the patient's home? not applicable Were any new equipment or medical supplies ordered?  No What is the name of the medical supply agency? no Were you able to get the supplies/equipment? not applicable Do you have any questions related to the use of the equipment or supplies? No  Functional Questionnaire: (I = Independent and D = Dependent) ADLs: I  Bathing/Dressing- I  Meal Prep- I  Eating- I  Maintaining continence- I  Transferring/Ambulation- I  Managing Meds- I  Follow up appointments reviewed:  PCP Hospital f/u appt confirmed? Yes  Scheduled to see Walker Kehr, MD on 06/03/2021 @ 2:00 pm. [place] [facility] f/u appt confirmed? Yes  Scheduled to see [facility] on 06/01/2022 @ 10:00 am. Are transportation arrangements needed? No  If their condition worsens, is the pt aware to call PCP or go to the Emergency Dept.? Yes Was the patient provided with contact information for the PCP's office or ED? Yes Was to pt encouraged to call back with questions or concerns? Yes

## 2022-06-01 ENCOUNTER — Inpatient Hospital Stay: Payer: Medicare Other

## 2022-06-01 ENCOUNTER — Inpatient Hospital Stay: Payer: Medicare Other | Attending: Physician Assistant | Admitting: Physician Assistant

## 2022-06-01 ENCOUNTER — Other Ambulatory Visit: Payer: Self-pay

## 2022-06-01 ENCOUNTER — Encounter: Payer: Self-pay | Admitting: Licensed Clinical Social Worker

## 2022-06-01 ENCOUNTER — Telehealth: Payer: Self-pay | Admitting: Genetic Counselor

## 2022-06-01 VITALS — BP 170/78 | HR 64 | Temp 98.1°F | Resp 19 | Ht 68.0 in | Wt 234.2 lb

## 2022-06-01 DIAGNOSIS — C250 Malignant neoplasm of head of pancreas: Secondary | ICD-10-CM | POA: Diagnosis not present

## 2022-06-01 DIAGNOSIS — J45909 Unspecified asthma, uncomplicated: Secondary | ICD-10-CM

## 2022-06-01 DIAGNOSIS — C259 Malignant neoplasm of pancreas, unspecified: Secondary | ICD-10-CM

## 2022-06-01 DIAGNOSIS — Z1379 Encounter for other screening for genetic and chromosomal anomalies: Secondary | ICD-10-CM

## 2022-06-01 DIAGNOSIS — Z8507 Personal history of malignant neoplasm of pancreas: Secondary | ICD-10-CM | POA: Diagnosis not present

## 2022-06-01 DIAGNOSIS — Z5111 Encounter for antineoplastic chemotherapy: Secondary | ICD-10-CM | POA: Insufficient documentation

## 2022-06-01 LAB — CMP (CANCER CENTER ONLY)
ALT: 81 U/L — ABNORMAL HIGH (ref 0–44)
AST: 54 U/L — ABNORMAL HIGH (ref 15–41)
Albumin: 3.4 g/dL — ABNORMAL LOW (ref 3.5–5.0)
Alkaline Phosphatase: 200 U/L — ABNORMAL HIGH (ref 38–126)
Anion gap: 4 — ABNORMAL LOW (ref 5–15)
BUN: 12 mg/dL (ref 8–23)
CO2: 29 mmol/L (ref 22–32)
Calcium: 8.8 mg/dL — ABNORMAL LOW (ref 8.9–10.3)
Chloride: 108 mmol/L (ref 98–111)
Creatinine: 1.07 mg/dL (ref 0.61–1.24)
GFR, Estimated: >60 mL/min (ref >60)
Glucose, Bld: 97 mg/dL (ref 70–99)
Potassium: 4.3 mmol/L (ref 3.5–5.1)
Sodium: 141 mmol/L (ref 135–145)
Total Bilirubin: 2 mg/dL — ABNORMAL HIGH (ref 0.3–1.2)
Total Protein: 6.2 g/dL — ABNORMAL LOW (ref 6.5–8.1)

## 2022-06-01 LAB — CBC WITH DIFFERENTIAL (CANCER CENTER ONLY)
Abs Immature Granulocytes: 0.09 10*3/uL — ABNORMAL HIGH (ref 0.00–0.07)
Basophils Absolute: 0 10*3/uL (ref 0.0–0.1)
Basophils Relative: 1 %
Eosinophils Absolute: 0.2 10*3/uL (ref 0.0–0.5)
Eosinophils Relative: 3 %
HCT: 37.9 % — ABNORMAL LOW (ref 39.0–52.0)
Hemoglobin: 12.9 g/dL — ABNORMAL LOW (ref 13.0–17.0)
Immature Granulocytes: 1 %
Lymphocytes Relative: 18 %
Lymphs Abs: 1.2 10*3/uL (ref 0.7–4.0)
MCH: 29.5 pg (ref 26.0–34.0)
MCHC: 34 g/dL (ref 30.0–36.0)
MCV: 86.7 fL (ref 80.0–100.0)
Monocytes Absolute: 0.6 10*3/uL (ref 0.1–1.0)
Monocytes Relative: 9 %
Neutro Abs: 4.5 10*3/uL (ref 1.7–7.7)
Neutrophils Relative %: 68 %
Platelet Count: 212 10*3/uL (ref 150–400)
RBC: 4.37 MIL/uL (ref 4.22–5.81)
RDW: 14.1 % (ref 11.5–15.5)
WBC Count: 6.6 10*3/uL (ref 4.0–10.5)
nRBC: 0 % (ref 0.0–0.2)

## 2022-06-01 LAB — GENETIC SCREENING ORDER

## 2022-06-01 MED ORDER — PROCHLORPERAZINE MALEATE 10 MG PO TABS: 10.0000 mg | ORAL_TABLET | Freq: Four times a day (QID) | ORAL | 1 refills | Status: DC | PRN

## 2022-06-01 MED ORDER — ONDANSETRON HCL 8 MG PO TABS: 8.0000 mg | ORAL_TABLET | Freq: Three times a day (TID) | ORAL | 0 refills | Status: DC | PRN

## 2022-06-01 MED ORDER — ONDANSETRON HCL 8 MG PO TABS: 8.0000 mg | ORAL_TABLET | Freq: Two times a day (BID) | ORAL | 1 refills | Status: DC | PRN

## 2022-06-01 MED ORDER — LIDOCAINE-PRILOCAINE 2.5-2.5 % EX CREA: TOPICAL_CREAM | CUTANEOUS | 3 refills | Status: DC

## 2022-06-01 MED ORDER — LIDOCAINE-PRILOCAINE 2.5-2.5 % EX CREA: 1.0000 | TOPICAL_CREAM | CUTANEOUS | 2 refills | Status: AC | PRN

## 2022-06-01 MED ORDER — PROCHLORPERAZINE MALEATE 10 MG PO TABS: 10.0000 mg | ORAL_TABLET | Freq: Four times a day (QID) | ORAL | 2 refills | Status: DC | PRN

## 2022-06-01 NOTE — Progress Notes (Signed)
I met with Mr [name] and his nephew Wille Glaser after  his consultation with Cassie Heilingoetter, PA-C and Dr Burr Medico.  I explained my role as a nurse navigator and provided my contact information. I explained the services provided at [facility] and provided written information.  I explained the alight grant and let  them know one of the financial advisors will reach out to  him at the time of his chemo education class.  I recommended a second person attend chemo ed with him. I briefly explained insertion and care of a port a cath.  I showed a sample of the port a cath.   I told them that he will be scheduled for chemotherapy education class prior to receiving chemotherapy.  I told them our schedulers will call him with those appts.  All questions were answered. They verbalized understanding.

## 2022-06-01 NOTE — Progress Notes (Signed)
START ON PATHWAY REGIMEN - Pancreatic Adenocarcinoma     A cycle is every 14 days:     Irinotecan      Oxaliplatin      Leucovorin      Fluorouracil   **Always confirm dose/schedule in your pharmacy ordering system**  Patient Characteristics: Preoperative, M0 (Clinical Staging), Resectable, Neoadjuvant Therapy Planned, BRCA1/2 and PALB2 Mutation Absent/Unknown Therapeutic Status: Preoperative, M0 (Clinical Staging) AJCC T Category: cT2 AJCC N Category: cN0 Resectability Status: Resectable AJCC M Category: cM0 AJCC 8 Stage Grouping: IB BRCA1/2 Mutation Status: Awaiting Test Results PALB2 Mutation Status: Awaiting Test Results Intent of Therapy: Curative Intent, Discussed with Patient

## 2022-06-01 NOTE — Patient Instructions (Addendum)
-  Summary:  --The sample (biopsy) that they took of your tumor was consistent with Pancreatic Cancer -We covered a lot of important information at your appointment today regarding what the plan of care is.  -Dr. Zenia Resides evaluated you and felt that the tumor was likely resectable.  Some patients require chemotherapy before or after surgery to help reduce the risk of recurrence.  The rationale behind having chemotherapy first is to shrink the area prior to surgery.  Additionally, most patients are in better health prior to surgery to withstand chemotherapy since a lot of patients can have some nutritional concerns after a big surgery. -The treatment that 3 chemotherapy drugs 5-Fu, Leucovorin Oxaliplatin, Irinotecan, . This is given once a week every other week. You go home with a pump that you have to return 2 days later to get the pump removed. This treatment is about ~3-6 months.  -Side effects of chemotherapy include fatigue, losing your hair, numbness/tingling in the hands/fingers, risk of infection, nausea/vomiting, and drops in the blood count. Of course, we will be monitoring closely to limit or prevent/manage side effects.  -I I will arrange for chemo education class prior to starting treatment.  This is where you sit down one-on-one with the nurse educator and she will discuss your treatment with you in more depth and review common side effects, important resources, and phone numbers.  -We will also reach out to Dr. Zenia Resides to see if she can place a Port-A-Cath.  This allows you to get your treatment and lab work done through the Port-A-Cath to avoid having IVs in your upper arms.  You will need numbing cream for your port once the area has healed.  I have sent the numbing cream to your pharmacy.  You will apply a quarter sized amount on top of the port and cover it with kitchen Saran wrap about 60 to 30 minutes prior to your appointment.  By the time you get here, the area will be nice and numb and ready  to use for treatment days.  Prescriptions: I sent a prescription for Compazine which is 1 tablet every 6 hours as needed for nausea and vomiting.  I also sent a prescription for Zofran which can be taken every 8 hours as needed for nausea and vomiting; however, this prescription is more helpful starting 3 days after treatment, the reason being you do receive a antinausea medicine on the day of treatment in the infusion room similar to this medication and it stays in your system for 3 days.     Referrals or Imaging: -You are scheduled for genetic testing to ensure that you do not have any hereditary cancer, because if positive, then your children should be tested. It is a blood test.  -I will refer you to social work to see what social benefits you qualify for       Follow up:  -We will see you back in about 2 weeks after you have had to port placed to start treatment. We will see you before you start treatment and any questions you have at that time.

## 2022-06-01 NOTE — Telephone Encounter (Signed)
An order for [place] was placed on 06/01/2022.   Lucille Passy, MS, [facility] Genetic Counselor [email or web address] (P) [phone]

## 2022-06-02 ENCOUNTER — Other Ambulatory Visit: Payer: Self-pay

## 2022-06-02 ENCOUNTER — Ambulatory Visit: Payer: Self-pay | Admitting: Surgery

## 2022-06-02 ENCOUNTER — Telehealth: Payer: Self-pay | Admitting: Hematology

## 2022-06-02 DIAGNOSIS — C259 Malignant neoplasm of pancreas, unspecified: Secondary | ICD-10-CM | POA: Diagnosis not present

## 2022-06-02 NOTE — Progress Notes (Signed)
The proposed treatment discussed in conference is for discussion purpose only and is not a binding recommendation.  The patients have not been physically examined, or presented with their treatment options.  Therefore, final treatment plans cannot be decided.  

## 2022-06-02 NOTE — H&P (Signed)
History of Present Illness: [name] is a 76 y.o. male who was referred to me for evaluation of a newly-diagnosed pancreatic cancer.  He was recently admitted with abdominal pain and obstructive jaundice, and found to have a mass in the head of the pancreas. He underwent EUS/ERCP on 05/25/22 by Dr. Paulita Fujita and Dr. Watt Climes. This showed a uT2N0 mass in the head of the pancreas, and FNA confirmed adenocarcinoma. An uncovered metal stent was placed. Since the procedure last week, his abdominal pain has nearly resolved. He reports his urine is now normal in color. Tbili yesterday was down to 2.0.   He has not had any prior abdominal surgeries.     Review of Systems: A complete review of systems was obtained from the patient.  I have reviewed this information and discussed as appropriate with the patient.  See HPI as well for other ROS.       Medical History: Past Medical History Past Medical History: Diagnosis Date  Arthritis    Asthma, unspecified asthma severity, unspecified whether complicated, unspecified whether persistent    History of cancer        Patient Active Problem List Diagnosis  Pancreatic adenocarcinoma (CMS-HCC)     Past Surgical History No past surgical history on file.     Allergies No Known Allergies    Current Outpatient Medications on File Prior to Visit Medication Sig Dispense Refill  albuterol 90 mcg/actuation inhaler Inhale 2 puffs in the lungs every 4 hours as needed for cough, wheezing, or SOB.      blood glucose diagnostic (ACCU-CHEK GUIDE TEST STRIPS) test strip USE TO check blood sugar DAILY AS DIRECTED      pantoprazole (PROTONIX) 40 MG DR tablet Take 40 mg by mouth once daily      simvastatin (ZOCOR) 20 MG tablet TAKE ONE TABLET BY MOUTH EVERYDAY AT BEDTIME      acetaminophen (TYLENOL) 650 MG ER tablet Take by mouth      multivitamin capsule Take 1 capsule by mouth once daily      propylene glycoL (SYSTANE BALANCE) 0.6 % ophthalmic  drops Apply to eye       No current facility-administered medications on file prior to visit.     Family History Family History Problem Relation Age of Onset  Obesity Mother    High blood pressure (Hypertension) Mother    Hyperlipidemia (Elevated cholesterol) Mother    Coronary Artery Disease (Blocked arteries around heart) Mother    Deep vein thrombosis (DVT or abnormal blood clot formation) Mother    Obesity Sister    High blood pressure (Hypertension) Sister    Hyperlipidemia (Elevated cholesterol) Sister    Diabetes Sister        Social History   Tobacco Use Smoking Status Former  Types: Cigarettes  Quit date: 1974  Years since quitting: 49.5 Smokeless Tobacco Not on file     Social History Social History    Socioeconomic History  Marital status: Married Tobacco Use  Smoking status: Former     Types: Cigarettes     Quit date: 1974     Years since quitting: 49.5 Substance and Sexual Activity  Alcohol use: Not Currently  Drug use: Never      Objective:     Vitals:   06/02/22 0951 BP: (!) 148/90 Pulse: 87 Temp: 36.4 C (97.6 F) SpO2: 97% Weight: (!) 105.3 kg (232 lb 3.2 oz) Height: 172.7 cm ('5\' 8"'$ )   Body mass index is 35.31 kg/m.  Physical Exam Vitals reviewed.  Constitutional:      General: He is not in acute distress.    Appearance: Normal appearance.  Eyes:     General: No scleral icterus.    Conjunctiva/sclera: Conjunctivae normal.  Pulmonary:     Effort: Pulmonary effort is normal. No respiratory distress.  Abdominal:     General: There is no distension.     Palpations: Abdomen is soft.     Tenderness: There is no abdominal tenderness.  Musculoskeletal:     Cervical back: Normal range of motion.  Skin:    General: Skin is warm and dry.  Neurological:     General: No focal deficit present.     Mental Status: He is alert and oriented to person, place, and time.        Labs, Imaging and Diagnostic Testing: CA19-9 <2 on  05/26/22 in setting of jaundice (patient is likely a nonproducer)   Assessment and Plan: Diagnoses and all orders for this visit:   Adenocarcinoma of pancreas (CMS-HCC)     This is a 76 yo male newly-diagnosed with an adenocarcinoma of the head of the pancreas. His case was reviewed at [facility] this morning. The SMV/PV and SMA are free of disease, however unfortunately his proper hepatic artery appears encased by tumor. On my review the bifurcation appears involved. This would likely require a complex arterial reconstruction to obtain a clear margin. He should first have neoadjuvant chemotherapy, and is scheduled to begin next week. Resectability will be reassessed after his first 6 cycles of chemotherapy. He may also benefit from radiation. I discussed this with him today. I also reviewed the overall aggressive nature of pancreatic cancer and the high risk of recurrence, even in patients who undergone complete resection. He expressed understanding. I will schedule him for portacath insertion ASAP to begin chemotherapy next week.  Michaelle Birks, MD [facility] Surgery General, Hepatobiliary and Pancreatic Surgery 06/02/22 10:37 AM

## 2022-06-02 NOTE — Telephone Encounter (Signed)
.  Called patient to schedule appointment per 7/19 inbasket, patient is aware of date and time.   

## 2022-06-02 NOTE — Patient Instructions (Signed)
DUE TO COVID-19 ONLY TWO VISITORS  (aged 78 and older)  ARE ALLOWED TO COME WITH YOU AND STAY IN THE WAITING ROOM ONLY DURING PRE OP AND PROCEDURE.   **NO VISITORS ARE ALLOWED IN THE SHORT STAY AREA OR RECOVERY ROOM!!**  IF YOU WILL BE ADMITTED INTO THE HOSPITAL YOU ARE ALLOWED ONLY FOUR SUPPORT PEOPLE DURING VISITATION HOURS ONLY (7 AM -8PM)   The support person(s) must pass our screening, gel in and out, and wear a mask at all times, including in the patient's room. Patients must also wear a mask when staff or their support person are in the room. Visitors GUEST BADGE MUST BE WORN VISIBLY  One adult visitor may remain with you overnight and MUST be in the room by 8 P.M.     Your procedure is scheduled on: 06/10/22   Report to [facility] Main Entrance    Report to admitting at : 6:45 AM   Call this number if you have problems the morning of surgery [phone]   Do not eat food :After Midnight.   After Midnight you may have the following liquids until : 6:00 AM DAY OF SURGERY  Water Black Coffee (sugar ok, NO MILK/CREAM OR CREAMERS)  Tea (sugar ok, NO MILK/CREAM OR CREAMERS) regular and decaf                             Plain Jell-O (NO RED)                                           Fruit ices (not with fruit pulp, NO RED)                                     Popsicles (NO RED)                                                                  Juice: apple, WHITE grape, WHITE cranberry Sports drinks like Gatorade (NO RED)    Oral Hygiene is also important to reduce your risk of infection.                                    Remember - BRUSH YOUR TEETH THE MORNING OF SURGERY WITH YOUR REGULAR TOOTHPASTE   Do NOT smoke after Midnight   Take these medicines the morning of surgery with A SIP OF WATER: pantoprazole.Use inhalers as usual. Tylenol and compazine as usual.  DO NOT TAKE ANY ORAL DIABETIC MEDICATIONS DAY OF YOUR SURGERY  Bring CPAP mask and tubing day of  surgery.                              You may not have any metal on your body including hair pins, jewelry, and body piercing             Do not wear lotions, powders, perfumes/cologne, or deodorant  Men may shave face and neck.   Do not bring valuables to the hospital. [place].   Contacts, dentures or bridgework may not be worn into surgery.   Bring small overnight bag day of surgery.   DO NOT [place]. PHARMACY WILL DISPENSE MEDICATIONS LISTED ON YOUR MEDICATION LIST TO YOU DURING YOUR ADMISSION [place]!    Patients discharged on the day of surgery will not be allowed to drive home.  Someone NEEDS to stay with you for the first 24 hours after anesthesia.   Special Instructions: Bring a copy of your healthcare power of attorney and living will documents         the day of surgery if you haven't scanned them before.              Please read over the following fact sheets you were given: IF YOU HAVE QUESTIONS ABOUT YOUR PRE-OP INSTRUCTIONS PLEASE CALL [phone]     [facility] - Preparing for Surgery Before surgery, you can play an important role.  Because skin is not sterile, your skin needs to be as free of germs as possible.  You can reduce the number of germs on your skin by washing with CHG (chlorahexidine gluconate) soap before surgery.  CHG is an antiseptic cleaner which kills germs and bonds with the skin to continue killing germs even after washing. Please DO NOT use if you have an allergy to CHG or antibacterial soaps.  If your skin becomes reddened/irritated stop using the CHG and inform your nurse when you arrive at Short Stay. Do not shave (including legs and underarms) for at least 48 hours prior to the first CHG shower.  You may shave your face/neck. Please follow these instructions carefully:  1.  Shower with CHG Soap the night before surgery and the  morning of  Surgery.  2.  If you choose to wash your hair, wash your hair first as usual with your  normal  shampoo.  3.  After you shampoo, rinse your hair and body thoroughly to remove the  shampoo.                           4.  Use CHG as you would any other liquid soap.  You can apply chg directly  to the skin and wash                       Gently with a scrungie or clean washcloth.  5.  Apply the CHG Soap to your body ONLY FROM THE NECK DOWN.   Do not use on face/ open                           Wound or open sores. Avoid contact with eyes, ears mouth and genitals (private parts).                       Wash face,  Genitals (private parts) with your normal soap.             6.  Wash thoroughly, paying special attention to the area where your surgery  will be performed.  7.  Thoroughly rinse your body with warm water from the neck down.  8.  DO NOT shower/wash with your normal soap after using and rinsing off  the CHG Soap.                9.  Pat yourself dry with a clean towel.            10.  Wear clean pajamas.            11.  Place clean sheets on your bed the night of your first shower and do not  sleep with pets. Day of Surgery : Do not apply any lotions/deodorants the morning of surgery.  Please wear clean clothes to the hospital/surgery center.  FAILURE TO FOLLOW THESE INSTRUCTIONS MAY RESULT IN THE CANCELLATION OF YOUR SURGERY PATIENT SIGNATURE_________________________________  NURSE SIGNATURE__________________________________  ________________________________________________________________________

## 2022-06-02 NOTE — H&P (View-Only) (Signed)
History of Present Illness: [name] is a 76 y.o. male who was referred to me for evaluation of a newly-diagnosed pancreatic cancer.  He was recently admitted with abdominal pain and obstructive jaundice, and found to have a mass in the head of the pancreas. He underwent EUS/ERCP on 05/25/22 by Dr. Paulita Fujita and Dr. Watt Climes. This showed a uT2N0 mass in the head of the pancreas, and FNA confirmed adenocarcinoma. An uncovered metal stent was placed. Since the procedure last week, his abdominal pain has nearly resolved. He reports his urine is now normal in color. Tbili yesterday was down to 2.0.   He has not had any prior abdominal surgeries.     Review of Systems: A complete review of systems was obtained from the patient.  I have reviewed this information and discussed as appropriate with the patient.  See HPI as well for other ROS.       Medical History: Past Medical History Past Medical History: Diagnosis Date  Arthritis    Asthma, unspecified asthma severity, unspecified whether complicated, unspecified whether persistent    History of cancer        Patient Active Problem List Diagnosis  Pancreatic adenocarcinoma (CMS-HCC)     Past Surgical History No past surgical history on file.     Allergies No Known Allergies    Current Outpatient Medications on File Prior to Visit Medication Sig Dispense Refill  albuterol 90 mcg/actuation inhaler Inhale 2 puffs in the lungs every 4 hours as needed for cough, wheezing, or SOB.      blood glucose diagnostic (ACCU-CHEK GUIDE TEST STRIPS) test strip USE TO check blood sugar DAILY AS DIRECTED      pantoprazole (PROTONIX) 40 MG DR tablet Take 40 mg by mouth once daily      simvastatin (ZOCOR) 20 MG tablet TAKE ONE TABLET BY MOUTH EVERYDAY AT BEDTIME      acetaminophen (TYLENOL) 650 MG ER tablet Take by mouth      multivitamin capsule Take 1 capsule by mouth once daily      propylene glycoL (SYSTANE BALANCE) 0.6 % ophthalmic  drops Apply to eye       No current facility-administered medications on file prior to visit.     Family History Family History Problem Relation Age of Onset  Obesity Mother    High blood pressure (Hypertension) Mother    Hyperlipidemia (Elevated cholesterol) Mother    Coronary Artery Disease (Blocked arteries around heart) Mother    Deep vein thrombosis (DVT or abnormal blood clot formation) Mother    Obesity Sister    High blood pressure (Hypertension) Sister    Hyperlipidemia (Elevated cholesterol) Sister    Diabetes Sister        Social History   Tobacco Use Smoking Status Former  Types: Cigarettes  Quit date: 1974  Years since quitting: 49.5 Smokeless Tobacco Not on file     Social History Social History    Socioeconomic History  Marital status: Married Tobacco Use  Smoking status: Former     Types: Cigarettes     Quit date: 1974     Years since quitting: 49.5 Substance and Sexual Activity  Alcohol use: Not Currently  Drug use: Never      Objective:     Vitals:   06/02/22 0951 BP: (!) 148/90 Pulse: 87 Temp: 36.4 C (97.6 F) SpO2: 97% Weight: (!) 105.3 kg (232 lb 3.2 oz) Height: 172.7 cm ('5\' 8"'$ )   Body mass index is 35.31 kg/m.  Physical Exam Vitals reviewed.  Constitutional:      General: He is not in acute distress.    Appearance: Normal appearance.  Eyes:     General: No scleral icterus.    Conjunctiva/sclera: Conjunctivae normal.  Pulmonary:     Effort: Pulmonary effort is normal. No respiratory distress.  Abdominal:     General: There is no distension.     Palpations: Abdomen is soft.     Tenderness: There is no abdominal tenderness.  Musculoskeletal:     Cervical back: Normal range of motion.  Skin:    General: Skin is warm and dry.  Neurological:     General: No focal deficit present.     Mental Status: He is alert and oriented to person, place, and time.        Labs, Imaging and Diagnostic Testing: CA19-9 <2 on  05/26/22 in setting of jaundice (patient is likely a nonproducer)   Assessment and Plan: Diagnoses and all orders for this visit:   Adenocarcinoma of pancreas (CMS-HCC)     This is a 76 yo male newly-diagnosed with an adenocarcinoma of the head of the pancreas. His case was reviewed at [facility] this morning. The SMV/PV and SMA are free of disease, however unfortunately his proper hepatic artery appears encased by tumor. On my review the bifurcation appears involved. This would likely require a complex arterial reconstruction to obtain a clear margin. He should first have neoadjuvant chemotherapy, and is scheduled to begin next week. Resectability will be reassessed after his first 6 cycles of chemotherapy. He may also benefit from radiation. I discussed this with him today. I also reviewed the overall aggressive nature of pancreatic cancer and the high risk of recurrence, even in patients who undergone complete resection. He expressed understanding. I will schedule him for portacath insertion ASAP to begin chemotherapy next week.  Michaelle Birks, MD [facility] Surgery General, Hepatobiliary and Pancreatic Surgery 06/02/22 10:37 AM

## 2022-06-03 ENCOUNTER — Encounter (HOSPITAL_COMMUNITY)
Admission: RE | Admit: 2022-06-03 | Discharge: 2022-06-03 | Disposition: A | Discharge status: Home / Self Care | Payer: Medicare Other | Source: Ambulatory Visit | Attending: Surgery | Admitting: Surgery

## 2022-06-03 ENCOUNTER — Other Ambulatory Visit: Payer: Self-pay

## 2022-06-03 ENCOUNTER — Ambulatory Visit (INDEPENDENT_AMBULATORY_CARE_PROVIDER_SITE_OTHER): Payer: Medicare Other | Admitting: Internal Medicine

## 2022-06-03 ENCOUNTER — Encounter: Payer: Self-pay | Admitting: Internal Medicine

## 2022-06-03 ENCOUNTER — Encounter (HOSPITAL_COMMUNITY): Payer: Self-pay

## 2022-06-03 VITALS — BP 167/77 | HR 70 | Temp 98.1°F | Ht 68.0 in | Wt 227.0 lb

## 2022-06-03 DIAGNOSIS — R7303 Prediabetes: Secondary | ICD-10-CM

## 2022-06-03 DIAGNOSIS — C259 Malignant neoplasm of pancreas, unspecified: Secondary | ICD-10-CM | POA: Diagnosis not present

## 2022-06-03 DIAGNOSIS — K831 Obstruction of bile duct: Secondary | ICD-10-CM

## 2022-06-03 DIAGNOSIS — N1831 Chronic kidney disease, stage 3a: Secondary | ICD-10-CM

## 2022-06-03 DIAGNOSIS — Z01812 Encounter for preprocedural laboratory examination: Secondary | ICD-10-CM | POA: Diagnosis not present

## 2022-06-03 DIAGNOSIS — I10 Essential (primary) hypertension: Secondary | ICD-10-CM

## 2022-06-03 HISTORY — DX: Malignant (primary) neoplasm, unspecified: C80.1

## 2022-06-03 HISTORY — DX: Pneumonia, unspecified organism: J18.9

## 2022-06-03 HISTORY — DX: Dyspnea, unspecified: R06.00

## 2022-06-03 LAB — COMPREHENSIVE METABOLIC PANEL
ALT: 71 U/L — ABNORMAL HIGH (ref 0–53)
AST: 56 U/L — ABNORMAL HIGH (ref 0–37)
Albumin: 3.6 g/dL (ref 3.5–5.2)
Alkaline Phosphatase: 231 U/L — ABNORMAL HIGH (ref 39–117)
BUN: 13 mg/dL (ref 6–23)
CO2: 27 mEq/L (ref 19–32)
Calcium: 8.7 mg/dL (ref 8.4–10.5)
Chloride: 105 mEq/L (ref 96–112)
Creatinine, Ser: 1.05 mg/dL (ref 0.40–1.50)
GFR: 68.24 mL/min (ref >60.00)
Glucose, Bld: 122 mg/dL — ABNORMAL HIGH (ref 70–99)
Potassium: 3.9 mEq/L (ref 3.5–5.1)
Sodium: 140 mEq/L (ref 135–145)
Total Bilirubin: 1.7 mg/dL — ABNORMAL HIGH (ref 0.2–1.2)
Total Protein: 6.2 g/dL (ref 6.0–8.3)

## 2022-06-03 LAB — CBC WITH DIFFERENTIAL/PLATELET
Basophils Absolute: 0 10*3/uL (ref 0.0–0.1)
Basophils Relative: 0.8 % (ref 0.0–3.0)
Eosinophils Absolute: 0.1 10*3/uL (ref 0.0–0.7)
Eosinophils Relative: 2.5 % (ref 0.0–5.0)
HCT: 39 % (ref 39.0–52.0)
Hemoglobin: 12.9 g/dL — ABNORMAL LOW (ref 13.0–17.0)
Lymphocytes Relative: 14.2 % (ref 12.0–46.0)
Lymphs Abs: 0.8 10*3/uL (ref 0.7–4.0)
MCHC: 33.1 g/dL (ref 30.0–36.0)
MCV: 87.1 fl (ref 78.0–100.0)
Monocytes Absolute: 0.4 10*3/uL (ref 0.1–1.0)
Monocytes Relative: 7.6 % (ref 3.0–12.0)
Neutro Abs: 4.3 10*3/uL (ref 1.4–7.7)
Neutrophils Relative %: 74.9 % (ref 43.0–77.0)
Platelets: 212 10*3/uL (ref 150.0–400.0)
RBC: 4.48 Mil/uL (ref 4.22–5.81)
RDW: 14.8 % (ref 11.5–15.5)
WBC: 5.7 10*3/uL (ref 4.0–10.5)

## 2022-06-03 LAB — PROTIME-INR
INR: 0.9 ratio (ref 0.8–1.0)
Prothrombin Time: 10.3 s (ref 9.6–13.1)

## 2022-06-03 LAB — GLUCOSE, CAPILLARY: Glucose-Capillary: 123 mg/dL — ABNORMAL HIGH (ref 70–99)

## 2022-06-03 LAB — HEMOGLOBIN A1C
Hgb A1c MFr Bld: 5.7 % — ABNORMAL HIGH (ref 4.8–5.6)
Mean Plasma Glucose: 116.89 mg/dL

## 2022-06-03 NOTE — Assessment & Plan Note (Addendum)
Monitor labs A1c was OK Check CBC, INR, CMET
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
## 2022-06-03 NOTE — Assessment & Plan Note (Addendum)
  Better s/p ERCP with CBD stent placement, both performed on 05/25/2022. Surgery is planned on 7/24 - Dr Zenia Resides. A1c was OK Check CBC, INR, CMET

## 2022-06-03 NOTE — Assessment & Plan Note (Signed)
Hydrate well ?Monitor GFR ?

## 2022-06-03 NOTE — Assessment & Plan Note (Addendum)
ERCP with CBD stent placement, both performed on 05/25/2022. Surgery is planned on 7/24 - Dr Zenia Resides. A1c was OK Check CBC, INR, CMET

## 2022-06-03 NOTE — Progress Notes (Signed)
For Short Stay: [place] appointment date: Date of COVID positive in last 33 days: NO  Bowel Prep reminder:   For Anesthesia: PCP - Dr. Billey Gosling Cardiologist -   Chest x-ray - 05/21/22 . CT chest: 05/25/22 EKG - 05/24/22 Stress Test -  ECHO - 09/07/10 Cardiac Cath -  Pacemaker/ICD device last checked: Pacemaker orders received: Device Rep notified:  Spinal Cord Stimulator:  Sleep Study -  CPAP -   Fasting Blood Sugar -  Checks Blood Sugar _____ times a day Date and result of last Hgb A1c- 6.3: 02/04/22  Blood Thinner Instructions: Aspirin Instructions: Last Dose:  Activity level: Can go up a flight of stairs and activities of daily living without stopping and without chest pain and/or shortness of breath   Able to exercise without chest pain and/or shortness of breath   Unable to go up a flight of stairs without chest pain and/or shortness of breath     Anesthesia review: Hx: DIA  Patient denies shortness of breath, fever, cough and chest pain at PAT appointment   Patient verbalized understanding of instructions that were given to them at the PAT appointment. Patient was also instructed that they will need to review over the PAT instructions again at home before surgery.

## 2022-06-07 ENCOUNTER — Ambulatory Visit (HOSPITAL_COMMUNITY)
Admission: RE | Admit: 2022-06-07 | Discharge: 2022-06-07 | Disposition: A | Discharge status: Home / Self Care | Payer: Medicare Other | Attending: Surgery | Admitting: Surgery

## 2022-06-07 ENCOUNTER — Encounter: Payer: Self-pay | Admitting: Hematology

## 2022-06-07 ENCOUNTER — Ambulatory Visit (HOSPITAL_BASED_OUTPATIENT_CLINIC_OR_DEPARTMENT_OTHER): Payer: Medicare Other | Admitting: Certified Registered Nurse Anesthetist

## 2022-06-07 ENCOUNTER — Ambulatory Visit (HOSPITAL_COMMUNITY): Payer: Medicare Other

## 2022-06-07 ENCOUNTER — Encounter (HOSPITAL_COMMUNITY): Payer: Self-pay | Admitting: Surgery

## 2022-06-07 ENCOUNTER — Encounter (HOSPITAL_COMMUNITY)
Admission: RE | Disposition: A | Discharge status: Home / Self Care | Payer: Self-pay | Source: Home / Self Care | Attending: Surgery

## 2022-06-07 ENCOUNTER — Other Ambulatory Visit: Payer: Self-pay

## 2022-06-07 ENCOUNTER — Ambulatory Visit (HOSPITAL_COMMUNITY): Payer: Medicare Other | Admitting: Certified Registered Nurse Anesthetist

## 2022-06-07 DIAGNOSIS — K831 Obstruction of bile duct: Secondary | ICD-10-CM | POA: Insufficient documentation

## 2022-06-07 DIAGNOSIS — C259 Malignant neoplasm of pancreas, unspecified: Secondary | ICD-10-CM

## 2022-06-07 DIAGNOSIS — Z87891 Personal history of nicotine dependence: Secondary | ICD-10-CM

## 2022-06-07 DIAGNOSIS — I10 Essential (primary) hypertension: Secondary | ICD-10-CM | POA: Insufficient documentation

## 2022-06-07 DIAGNOSIS — K219 Gastro-esophageal reflux disease without esophagitis: Secondary | ICD-10-CM | POA: Diagnosis not present

## 2022-06-07 DIAGNOSIS — N289 Disorder of kidney and ureter, unspecified: Secondary | ICD-10-CM | POA: Diagnosis not present

## 2022-06-07 DIAGNOSIS — E669 Obesity, unspecified: Secondary | ICD-10-CM | POA: Diagnosis not present

## 2022-06-07 DIAGNOSIS — Z452 Encounter for adjustment and management of vascular access device: Secondary | ICD-10-CM | POA: Diagnosis not present

## 2022-06-07 DIAGNOSIS — J45909 Unspecified asthma, uncomplicated: Secondary | ICD-10-CM | POA: Diagnosis not present

## 2022-06-07 DIAGNOSIS — Z6834 Body mass index (BMI) 34.0-34.9, adult: Secondary | ICD-10-CM | POA: Diagnosis not present

## 2022-06-07 DIAGNOSIS — E119 Type 2 diabetes mellitus without complications: Secondary | ICD-10-CM | POA: Diagnosis not present

## 2022-06-07 DIAGNOSIS — C250 Malignant neoplasm of head of pancreas: Secondary | ICD-10-CM | POA: Diagnosis not present

## 2022-06-07 DIAGNOSIS — E1151 Type 2 diabetes mellitus with diabetic peripheral angiopathy without gangrene: Secondary | ICD-10-CM | POA: Diagnosis not present

## 2022-06-07 DIAGNOSIS — Z9889 Other specified postprocedural states: Secondary | ICD-10-CM | POA: Diagnosis not present

## 2022-06-07 DIAGNOSIS — J984 Other disorders of lung: Secondary | ICD-10-CM | POA: Diagnosis not present

## 2022-06-07 DIAGNOSIS — M954 Acquired deformity of chest and rib: Secondary | ICD-10-CM | POA: Diagnosis not present

## 2022-06-07 HISTORY — PX: PORTACATH PLACEMENT: SHX2246

## 2022-06-07 LAB — GLUCOSE, CAPILLARY
Glucose-Capillary: 111 mg/dL — ABNORMAL HIGH (ref 70–99)
Glucose-Capillary: 117 mg/dL — ABNORMAL HIGH (ref 70–99)

## 2022-06-07 SURGERY — INSERTION, TUNNELED CENTRAL VENOUS DEVICE, WITH PORT
Anesthesia: General

## 2022-06-07 MED ORDER — ORAL CARE MOUTH RINSE: 15.0000 mL | Freq: Once | OROMUCOSAL | Status: AC

## 2022-06-07 MED ORDER — EPHEDRINE SULFATE-NACL 50-0.9 MG/10ML-% IV SOSY
PREFILLED_SYRINGE | INTRAVENOUS | Status: DC | PRN
  Administered 2022-06-07: 10 mg via INTRAVENOUS

## 2022-06-07 MED ORDER — PROPOFOL 10 MG/ML IV BOLUS
INTRAVENOUS | Status: DC | PRN
  Administered 2022-06-07: 150 mg via INTRAVENOUS
  Administered 2022-06-07: 50 mg via INTRAVENOUS

## 2022-06-07 MED ORDER — LIDOCAINE 2% (20 MG/ML) 5 ML SYRINGE
INTRAMUSCULAR | Status: DC | PRN
  Administered 2022-06-07: 60 mg via INTRAVENOUS

## 2022-06-07 MED ORDER — PROPOFOL 10 MG/ML IV BOLUS
INTRAVENOUS | Status: AC
  Filled 2022-06-07: qty 20

## 2022-06-07 MED ORDER — ONDANSETRON HCL 4 MG/2ML IJ SOLN
INTRAMUSCULAR | Status: AC
  Filled 2022-06-07: qty 2

## 2022-06-07 MED ORDER — DEXAMETHASONE SODIUM PHOSPHATE 10 MG/ML IJ SOLN
INTRAMUSCULAR | Status: AC
  Filled 2022-06-07: qty 1

## 2022-06-07 MED ORDER — BUPIVACAINE-EPINEPHRINE 0.25% -1:200000 IJ SOLN
INTRAMUSCULAR | Status: DC | PRN
  Administered 2022-06-07: 15 mL

## 2022-06-07 MED ORDER — BUPIVACAINE-EPINEPHRINE (PF) 0.25% -1:200000 IJ SOLN
INTRAMUSCULAR | Status: AC
  Filled 2022-06-07: qty 30

## 2022-06-07 MED ORDER — LIDOCAINE HCL (PF) 1 % IJ SOLN
INTRAMUSCULAR | Status: AC
  Filled 2022-06-07: qty 30

## 2022-06-07 MED ORDER — DEXAMETHASONE SODIUM PHOSPHATE 10 MG/ML IJ SOLN
INTRAMUSCULAR | Status: DC | PRN
  Administered 2022-06-07: 5 mg via INTRAVENOUS

## 2022-06-07 MED ORDER — CEFAZOLIN SODIUM-DEXTROSE 2-4 GM/100ML-% IV SOLN
2.0000 g | INTRAVENOUS | Status: AC
  Administered 2022-06-07: 2 g via INTRAVENOUS
  Filled 2022-06-07: qty 100

## 2022-06-07 MED ORDER — CHLORHEXIDINE GLUCONATE 0.12 % MT SOLN
15.0000 mL | Freq: Once | OROMUCOSAL | Status: AC
  Administered 2022-06-07: 15 mL via OROMUCOSAL

## 2022-06-07 MED ORDER — HEPARIN SOD (PORK) LOCK FLUSH 100 UNIT/ML IV SOLN
INTRAVENOUS | Status: AC
  Filled 2022-06-07: qty 5

## 2022-06-07 MED ORDER — HEPARIN SOD (PORK) LOCK FLUSH 100 UNIT/ML IV SOLN
INTRAVENOUS | Status: DC | PRN
  Administered 2022-06-07: 500 [IU] via INTRAVENOUS

## 2022-06-07 MED ORDER — FENTANYL CITRATE (PF) 100 MCG/2ML IJ SOLN
INTRAMUSCULAR | Status: DC | PRN
  Administered 2022-06-07: 25 ug via INTRAVENOUS
  Administered 2022-06-07: 50 ug via INTRAVENOUS
  Administered 2022-06-07: 25 ug via INTRAVENOUS

## 2022-06-07 MED ORDER — HEPARIN 6000 UNIT IRRIGATION SOLUTION
Freq: Once | Status: AC
  Administered 2022-06-07: 1
  Filled 2022-06-07: qty 6000

## 2022-06-07 MED ORDER — HYDRALAZINE HCL 20 MG/ML IJ SOLN
INTRAMUSCULAR | Status: AC
  Filled 2022-06-07: qty 1

## 2022-06-07 MED ORDER — PHENYLEPHRINE HCL-NACL 20-0.9 MG/250ML-% IV SOLN
INTRAVENOUS | Status: AC
  Filled 2022-06-07: qty 250

## 2022-06-07 MED ORDER — 0.9 % SODIUM CHLORIDE (POUR BTL) OPTIME
TOPICAL | Status: DC | PRN
  Administered 2022-06-07: 1000 mL

## 2022-06-07 MED ORDER — ONDANSETRON HCL 4 MG/2ML IJ SOLN
INTRAMUSCULAR | Status: DC | PRN
  Administered 2022-06-07: 4 mg via INTRAVENOUS

## 2022-06-07 MED ORDER — PHENYLEPHRINE 80 MCG/ML (10ML) SYRINGE FOR IV PUSH (FOR BLOOD PRESSURE SUPPORT)
PREFILLED_SYRINGE | INTRAVENOUS | Status: DC | PRN
  Administered 2022-06-07: 80 ug via INTRAVENOUS

## 2022-06-07 MED ORDER — LACTATED RINGERS IV SOLN: INTRAVENOUS | Status: DC

## 2022-06-07 MED ORDER — KETOROLAC TROMETHAMINE 30 MG/ML IJ SOLN
INTRAMUSCULAR | Status: DC | PRN
  Administered 2022-06-07: 15 mg via INTRAVENOUS

## 2022-06-07 MED ORDER — FENTANYL CITRATE (PF) 100 MCG/2ML IJ SOLN
INTRAMUSCULAR | Status: AC
  Filled 2022-06-07: qty 2

## 2022-06-07 MED ORDER — HYDRALAZINE HCL 20 MG/ML IJ SOLN
10.0000 mg | Freq: Once | INTRAMUSCULAR | Status: AC
  Administered 2022-06-07: 10 mg via INTRAVENOUS

## 2022-06-07 MED FILL — Dexamethasone Sodium Phosphate Inj 100 MG/10ML: INTRAMUSCULAR | Qty: 1 | Status: AC

## 2022-06-07 MED FILL — Fosaprepitant Dimeglumine For IV Infusion 150 MG (Base Eq): INTRAVENOUS | Qty: 5 | Status: AC

## 2022-06-07 SURGICAL SUPPLY — 45 items
ADH SKN CLS APL DERMABOND .7 (GAUZE/BANDAGES/DRESSINGS)
APL PRP STRL LF DISP 70% ISPRP (MISCELLANEOUS) ×1
APL SKNCLS STERI-STRIP NONHPOA (GAUZE/BANDAGES/DRESSINGS) ×1
BAG DECANTER FOR FLEXI CONT (MISCELLANEOUS) ×2 IMPLANT
BENZOIN TINCTURE PRP APPL 2/3 (GAUZE/BANDAGES/DRESSINGS) ×1 IMPLANT
BLADE SURG 15 STRL LF DISP TIS (BLADE) ×1 IMPLANT
BLADE SURG 15 STRL SS (BLADE) ×2
BLADE SURG SZ11 CARB STEEL (BLADE) ×2 IMPLANT
CHLORAPREP W/TINT 26 (MISCELLANEOUS) ×2 IMPLANT
CLSR STERI-STRIP ANTIMIC 1/2X4 (GAUZE/BANDAGES/DRESSINGS) ×1 IMPLANT
COVER SURGICAL LIGHT HANDLE (MISCELLANEOUS) ×2 IMPLANT
DERMABOND ADVANCED (GAUZE/BANDAGES/DRESSINGS)
DERMABOND ADVANCED .7 DNX12 (GAUZE/BANDAGES/DRESSINGS) IMPLANT
DRAPE C-ARM 42X120 X-RAY (DRAPES) ×2 IMPLANT
DRAPE LAPAROSCOPIC ABDOMINAL (DRAPES) ×2 IMPLANT
DRAPE UTILITY 15X26 TOWEL STRL (DRAPES) ×2 IMPLANT
DRSG TEGADERM 4X4.75 (GAUZE/BANDAGES/DRESSINGS) ×1 IMPLANT
ELECT REM PT RETURN 15FT ADLT (MISCELLANEOUS) ×2 IMPLANT
GAUZE 4X4 16PLY ~~LOC~~+RFID DBL (SPONGE) ×2 IMPLANT
GAUZE SPONGE 4X4 12PLY STRL (GAUZE/BANDAGES/DRESSINGS) ×2 IMPLANT
GLOVE BIOGEL PI IND STRL 6 (GLOVE) ×1 IMPLANT
GLOVE BIOGEL PI INDICATOR 6 (GLOVE) ×1
GLOVE BIOGEL PI MICRO 5.5 (GLOVE) ×1
GLOVE BIOGEL PI MICRO STRL 5.5 (GLOVE) ×1 IMPLANT
GOWN STRL REUS W/ TWL LRG LVL3 (GOWN DISPOSABLE) ×1 IMPLANT
GOWN STRL REUS W/TWL LRG LVL3 (GOWN DISPOSABLE) ×2
IV CONNECTOR ONE LINK NDLESS (IV SETS) ×1 IMPLANT
KIT BASIN OR (CUSTOM PROCEDURE TRAY) ×2 IMPLANT
KIT PORT POWER 8FR ISP CVUE (Port) ×2 IMPLANT
KIT TURNOVER KIT A (KITS) ×1 IMPLANT
NEEDLE HYPO 22GX1.5 SAFETY (NEEDLE) ×2 IMPLANT
PACK BASIC VI WITH GOWN DISP (CUSTOM PROCEDURE TRAY) ×2 IMPLANT
PENCIL SMOKE EVACUATOR (MISCELLANEOUS) ×2 IMPLANT
SPIKE FLUID TRANSFER (MISCELLANEOUS) ×1 IMPLANT
SUT MNCRL AB 4-0 PS2 18 (SUTURE) ×2 IMPLANT
SUT PROLENE 2 0 SH DA (SUTURE) ×5 IMPLANT
SUT VIC AB 2-0 SH 18 (SUTURE) IMPLANT
SUT VIC AB 2-0 SH 27 (SUTURE)
SUT VIC AB 2-0 SH 27X BRD (SUTURE) IMPLANT
SUT VIC AB 3-0 SH 27 (SUTURE) ×2
SUT VIC AB 3-0 SH 27X BRD (SUTURE) ×1 IMPLANT
SYR 10ML LL (SYRINGE) ×2 IMPLANT
SYR 20ML LL LF (SYRINGE) ×2 IMPLANT
TOWEL OR 17X26 10 PK STRL BLUE (TOWEL DISPOSABLE) ×2 IMPLANT
TOWEL OR NON WOVEN STRL DISP B (DISPOSABLE) ×2 IMPLANT

## 2022-06-07 NOTE — Progress Notes (Signed)
[place] Work  Initial Assessment   [name] is a 76 y.o. year old male contacted by phone. Clinical Social Work was referred by medical provider for assessment of psychosocial needs.   SDOH (Social Determinants of Health) assessments performed: Yes   SDOH Screenings   Alcohol Screen: Low Risk  (11/30/2021)   Alcohol Screen    Last Alcohol Screening Score (AUDIT): 0  Depression (PHQ2-9): Low Risk  (02/04/2022)   Depression (PHQ2-9)    PHQ-2 Score: 0  Financial Resource Strain: Low Risk  (11/30/2021)   Overall Financial Resource Strain (CARDIA)    Difficulty of Paying Living Expenses: Not hard at all  Food Insecurity: No Food Insecurity (11/30/2021)   Hunger Vital Sign    Worried About Running Out of Food in the Last Year: Never true    [place] in the Last Year: Never true  Housing: Low Risk  (11/30/2021)   Housing    Last Housing Risk Score: 0  Physical Activity: Sufficiently Active (11/30/2021)   Exercise Vital Sign    Days of Exercise per Week: 5 days    Minutes of Exercise per Session: 30 min  Social Connections: Socially Integrated (11/30/2021)   Social Connection and Isolation Panel [NHANES]    Frequency of Communication with Friends and Family: More than three times a week    Frequency of Social Gatherings with Friends and Family: More than three times a week    Attends Religious Services: More than 4 times per year    Active Member of [employer] or Organizations: Yes    Attends Archivist Meetings: More than 4 times per year    Marital Status: Married  Stress: No Stress Concern Present (11/30/2021)   [employer] [place] of Stress : Not at all  Tobacco Use: Medium Risk (06/07/2022)   Patient History    Smoking Tobacco Use: Former    Smokeless Tobacco Use: Never    Passive Exposure: Not on file  Transportation Needs: No Transportation Needs (11/30/2021)   PRAPARE -  Hydrologist (Medical): No    Lack of Transportation (Non-Medical): No     Distress Screen completed: No     No data to display            Family/Social Information:  Housing Arrangement: patient lives with wife for whom he is the primary caretaker as she has mobility issues due to Parkinsons. Family members/support persons in your life? Pt states he has a daughter, granddaughter and nephew residing close by who will all be taking turns assisting the couple as pt undergoes treatment. Transportation concerns: no  Employment: Retired .  Income source: Paediatric nurse concerns: No Type of concern: None Food access concerns: no Religious or spiritual practice: Yes-Christian Services Currently in place: [employer] and social security retirement benefits  Coping/ Adjustment to diagnosis: Patient understands treatment plan and what happens next? yes Concerns about diagnosis and/or treatment: How I will care for other members of my family and How will I care for myself Patient reported stressors: Adjusting to my illness Hopes and/or priorities: Pt's priority is to start treatment with the hope of a positive outcome. Patient enjoys time with family/ friends Current coping skills/ strengths: Physical Health  and Supportive family/friends     SUMMARY: Current SDOH Barriers:  No barriers identified at this time.  Clinical Social Work Clinical Goal(s):  No clinical social work goals at this time  Interventions: Discussed common feeling and emotions when being diagnosed with cancer, and the importance of support during treatment Informed patient of the support team roles and support services at [facility] Provided [place] contact information and encouraged patient to call with any questions or concerns    Follow Up Plan: Patient will contact CSW with any support or resource needs Patient verbalizes understanding of plan:  Yes    Henriette Combs, LCSW

## 2022-06-07 NOTE — Transfer of Care (Signed)
Immediate Anesthesia Transfer of Care Note  Patient: [name]  Procedure(s) Performed: INSERTION PORT-A-CATH  Patient Location: PACU  Anesthesia Type:General  Level of Consciousness: drowsy and patient cooperative  Airway & Oxygen Therapy: Patient Spontanous Breathing and Patient connected to face mask oxygen  Post-op Assessment: Report given to RN and Post -op Vital signs reviewed and stable  Post vital signs: Reviewed and stable  Last Vitals:  Vitals Value Taken Time  BP 151/86 06/07/22 1013  Temp    Pulse 79 06/07/22 1014  Resp 21 06/07/22 1015  SpO2 100 % 06/07/22 1014  Vitals shown include unvalidated device data.  Last Pain:  Vitals:   06/07/22 0727  TempSrc:   PainSc: 0-No pain         Complications: No notable events documented.

## 2022-06-07 NOTE — Anesthesia Preprocedure Evaluation (Addendum)
Anesthesia Evaluation  Patient identified by MRN, date of birth, ID band Patient awake    Reviewed: Allergy & Precautions, NPO status , Patient's Chart, lab work & pertinent test results  Airway Mallampati: II  TM Distance: >3 FB Neck ROM: Full    Dental no notable dental hx. (+) Chipped, Missing, Dental Advisory Given,    Pulmonary asthma , former smoker,    Pulmonary exam normal breath sounds clear to auscultation       Cardiovascular hypertension, Pt. on medications + Peripheral Vascular Disease  Normal cardiovascular exam Rhythm:Regular Rate:Normal     Neuro/Psych    GI/Hepatic GERD  Medicated,CBD obstruction, pancreatic mass   Endo/Other  diabetes, Type 2  Renal/GU Renal InsufficiencyRenal diseaseLab Results      Component                Value               Date                      CREATININE               1.07                05/24/2022                K                        4.1                 05/24/2022                  Musculoskeletal  (+) Arthritis ,   Abdominal (+) + obese (BMI 34.4),   Peds  Hematology Lab Results      Component                Value               Date                      WBC                      7.3                 05/24/2022                HGB                      16.4                05/24/2022                HCT                      49.6                05/24/2022                MCV                      86.4                05/24/2022                PLT  150                 05/24/2022              Anesthesia Other Findings   Reproductive/Obstetrics                             Anesthesia Physical  Anesthesia Plan  ASA: 3  Anesthesia Plan: General   Post-op Pain Management: Toradol IV (intra-op)*   Induction: Intravenous  PONV Risk Score and Plan: 3 and Treatment may vary due to age or medical condition, Ondansetron,  Dexamethasone and Midazolam  Airway Management Planned: LMA  Additional Equipment: None  Intra-op Plan:   Post-operative Plan: Extubation in OR  Informed Consent: I have reviewed the patients History and Physical, chart, labs and discussed the procedure including the risks, benefits and alternatives for the proposed anesthesia with the patient or authorized representative who has indicated his/her understanding and acceptance.     Dental advisory given  Plan Discussed with:   Anesthesia Plan Comments:        Anesthesia Quick Evaluation

## 2022-06-07 NOTE — Progress Notes (Signed)
Checked patient's CBG results were 117

## 2022-06-07 NOTE — Op Note (Signed)
Date: 06/07/22  Patient: [name] MRN: [id number]  Preoperative Diagnosis: Pancreatic adenocarcinoma Postoperative Diagnosis: Same  Procedure: Port-a-cath insertion  Surgeon: Michaelle Birks, MD  EBL: Minimal  Anesthesia: General LMA  Specimens: None  Indications: [name] is a 76 yo male who presented with obstructive jaundice and was found to have a locally-advanced pancreatic adenocarcinoma. He has undergone staging and biliary stent placement. He is to begin chemotherapy tomorrow and presents today for port placement.  Findings: 8-Fr single-lumen power port placed via the right subclavian vein. Total catheter length of 17cm. Port was left accessed for immediate use.  Procedure details: Informed consent was obtained in the preoperative area prior to the procedure. The patient was brought to the operating room and placed on the table in the supine position. General anesthesia was induced and appropriate lines and drains were placed for intraoperative monitoring. Perioperative antibiotics were administered per SCIP guidelines. The neck and chest were prepped and draped in the usual sterile fashion. A pre-procedure timeout was taken verifying patient identity, surgical site and procedure to be performed.  The patient was placed in Trendelenberg and the right subclavian vein was accessed with a large-bore needle. A guidewire was inserted and advanced, and position in the SVC was confirmed fluoroscopically. The needle was removed and the wire was clipped to the drapes to secure its position. Next a small skin incision was made incorporating the wire exit site and a subcutaneous pocket was created with cautery. The port and catheter were then flushed and brought onto the field. Three 2-0 prolene sutures were used to secure the port in the subcutaneous pocket, but the sutures were not tied down. The port was placed in the pocket and the attached cathether was measured using fluoro - it  was placed over the skin adjacent to the guidewire, and marked externally at the cavoatrial junction. The catheter was then cut at this location, which was at 17cm. The dilator and sheath were then advanced over the guidewire under fluoroscopic guidance, and the wire and dilator were removed. The end of the catheter was inserted through the sheath and advanced, and the sheath was peeled away. There was some resistance to passing the catheter through the end of the sheath. The catheter was disconnected from the port, and a wire was advanced through the catheter without resistance into the SVC under fluoroscopic guidance. The sheath was peeled away. The catheter was then advanced into appropriate position over the wire under fluoroscopic guidance. The catheter was re-attached to the port. The port was then accessed with a Huber needle, and blood was aspirated and the port was flushed with heparinized saline. The prolene sutures were tied down and the port was again accessed with a huber needle, however it did not aspirate blood. Fluoroscopic imaging showed the catheter had retracted slightly and kinked in the subcutaneous tissue. The prolone anchoring sutures were all cut and removed. The catheter was again disconnected from the port and a wire was advanced through the catheter. The catheter was readvanced into appropriate position in the SVC under fluoro, and reaattached to the port. The port was anchored in the subcutaneous pocket using only 2 prolene sutures. The port was accessed with a Huber needle, and it flushed and aspirated blood easily.  A final fluoroscopic image confirmed appropriate position of the catheter tip within the SVC, without kinking of the catheter.  The skin was closed with a deep dermal layer of interrupted 3-0 Vicryl suture, followed by a running subcuticular 4-0  monocryl suture. Steristrips were applied. The port was then accessed with a butterfly Huber needle, and blood was able to be  easily aspirated. A final flush of 500 units heparin (100 units/mL) was given, and a bulky sterile gauze dressing was placed over the tubing and secured with tegaderm.  All counts were correct x2 at the end of the procedure. The patient was extubated and taken to PACU in stable condition.  Michaelle Birks, MD 06/07/22 10:09 AM

## 2022-06-07 NOTE — Discharge Instructions (Addendum)
SURGERY DISCHARGE INSTRUCTIONS: PORT-A-CATH PLACEMENT  Activity You may resume your usual activities as tolerated Do not shower with dressing in place over your port. Do not drive while taking narcotic pain medication.  Wound Care Your port has been left accessed so that it can be used right away for chemotherapy. Do NOT remove the dressing over your port or get it wet. You may shower  over your port incision only after it has been de-accessed following your first chemotherapy session. Do NOT shower with the dressing in place. Do not submerge your incision underwater. Monitor your incision for any new redness, tenderness, or drainage.   When to Call us: Fever greater than 100.5 New redness, drainage, or swelling at incision site Severe pain, nausea, or vomiting Shortness of breath, difficulty breathing  For questions or concerns, please call the [facility] Surgery office at [phone].

## 2022-06-07 NOTE — Anesthesia Procedure Notes (Addendum)
Procedure Name: LMA Insertion Date/Time: 06/07/2022 9:01 AM  Performed by: West Pugh, CRNAPre-anesthesia Checklist: Patient identified, Emergency Drugs available, Suction available, Patient being monitored and Timeout performed Patient Re-evaluated:Patient Re-evaluated prior to induction Oxygen Delivery Method: Circle system utilized Preoxygenation: Pre-oxygenation with 100% oxygen Induction Type: IV induction LMA: LMA with gastric port inserted LMA Size: 5.0 Number of attempts: 1 Placement Confirmation: positive ETCO2 Tube secured with: Tape Dental Injury: Teeth and Oropharynx as per pre-operative assessment

## 2022-06-07 NOTE — Anesthesia Postprocedure Evaluation (Signed)
Anesthesia Post Note  Patient: [name]  Procedure(s) Performed: INSERTION PORT-A-CATH     Patient location during evaluation: PACU Anesthesia Type: General Level of consciousness: awake and alert Pain management: pain level controlled Vital Signs Assessment: post-procedure vital signs reviewed and stable Respiratory status: spontaneous breathing, nonlabored ventilation, respiratory function stable and patient connected to nasal cannula oxygen Cardiovascular status: blood pressure returned to baseline and stable Postop Assessment: no apparent nausea or vomiting Anesthetic complications: no   No notable events documented.  Last Vitals:  Vitals:   06/07/22 1111 06/07/22 1130  BP: (!) 176/92 (!) 160/86  Pulse:  73  Resp:  18  Temp:    SpO2:  99%    Last Pain:  Vitals:   06/07/22 1130  TempSrc:   PainSc: 0-No pain                 Tiajuana Amass

## 2022-06-07 NOTE — Interval H&P Note (Signed)
History and Physical Interval Note:  06/07/2022 8:29 AM  [name]  has presented today for surgery, with the diagnosis of PANCREATIC CANCER.  The various methods of treatment have been discussed with the patient and family. After consideration of risks, benefits and other options for treatment, the patient has consented to  Procedure(s): INSERTION PORT-A-CATH (N/A) as a surgical intervention.  The patient's history has been reviewed, patient examined, no change in status, stable for surgery.  I have reviewed the patient's chart and labs.  Questions were answered to the patient's satisfaction.  Proceed to OR, plan for CXR in PACU and discharge home.   [name]

## 2022-06-08 ENCOUNTER — Encounter (HOSPITAL_COMMUNITY): Payer: Self-pay | Admitting: Surgery

## 2022-06-08 ENCOUNTER — Inpatient Hospital Stay: Payer: Medicare Other | Admitting: Hematology

## 2022-06-08 ENCOUNTER — Inpatient Hospital Stay: Payer: Medicare Other

## 2022-06-08 ENCOUNTER — Other Ambulatory Visit: Payer: Self-pay

## 2022-06-08 VITALS — BP 148/63 | HR 73 | Temp 98.2°F | Resp 18 | Ht 68.0 in | Wt 225.0 lb

## 2022-06-08 DIAGNOSIS — Z5111 Encounter for antineoplastic chemotherapy: Secondary | ICD-10-CM | POA: Diagnosis not present

## 2022-06-08 DIAGNOSIS — C259 Malignant neoplasm of pancreas, unspecified: Secondary | ICD-10-CM | POA: Diagnosis not present

## 2022-06-08 DIAGNOSIS — Z95828 Presence of other vascular implants and grafts: Secondary | ICD-10-CM

## 2022-06-08 DIAGNOSIS — C250 Malignant neoplasm of head of pancreas: Secondary | ICD-10-CM | POA: Diagnosis not present

## 2022-06-08 LAB — CMP (CANCER CENTER ONLY)
ALT: 31 U/L (ref 0–44)
AST: 23 U/L (ref 15–41)
Albumin: 3.7 g/dL (ref 3.5–5.0)
Alkaline Phosphatase: 166 U/L — ABNORMAL HIGH (ref 38–126)
Anion gap: 5 (ref 5–15)
BUN: 15 mg/dL (ref 8–23)
CO2: 28 mmol/L (ref 22–32)
Calcium: 8.7 mg/dL — ABNORMAL LOW (ref 8.9–10.3)
Chloride: 108 mmol/L (ref 98–111)
Creatinine: 1.07 mg/dL (ref 0.61–1.24)
GFR, Estimated: >60 mL/min (ref >60)
Glucose, Bld: 105 mg/dL — ABNORMAL HIGH (ref 70–99)
Potassium: 4.1 mmol/L (ref 3.5–5.1)
Sodium: 141 mmol/L (ref 135–145)
Total Bilirubin: 1.5 mg/dL — ABNORMAL HIGH (ref 0.3–1.2)
Total Protein: 6.2 g/dL — ABNORMAL LOW (ref 6.5–8.1)

## 2022-06-08 LAB — CBC WITH DIFFERENTIAL (CANCER CENTER ONLY)
Abs Immature Granulocytes: 0.01 10*3/uL (ref 0.00–0.07)
Basophils Absolute: 0 10*3/uL (ref 0.0–0.1)
Basophils Relative: 1 %
Eosinophils Absolute: 0 10*3/uL (ref 0.0–0.5)
Eosinophils Relative: 0 %
HCT: 36.3 % — ABNORMAL LOW (ref 39.0–52.0)
Hemoglobin: 12.3 g/dL — ABNORMAL LOW (ref 13.0–17.0)
Immature Granulocytes: 0 %
Lymphocytes Relative: 25 %
Lymphs Abs: 1.3 10*3/uL (ref 0.7–4.0)
MCH: 29.4 pg (ref 26.0–34.0)
MCHC: 33.9 g/dL (ref 30.0–36.0)
MCV: 86.6 fL (ref 80.0–100.0)
Monocytes Absolute: 0.6 10*3/uL (ref 0.1–1.0)
Monocytes Relative: 11 %
Neutro Abs: 3.2 10*3/uL (ref 1.7–7.7)
Neutrophils Relative %: 63 %
Platelet Count: 241 10*3/uL (ref 150–400)
RBC: 4.19 MIL/uL — ABNORMAL LOW (ref 4.22–5.81)
RDW: 13.8 % (ref 11.5–15.5)
WBC Count: 5.1 10*3/uL (ref 4.0–10.5)
nRBC: 0 % (ref 0.0–0.2)

## 2022-06-08 MED ORDER — SODIUM CHLORIDE 0.9 % IV SOLN
10.0000 mg | Freq: Once | INTRAVENOUS | Status: AC
  Administered 2022-06-08: 10 mg via INTRAVENOUS
  Filled 2022-06-08: qty 10

## 2022-06-08 MED ORDER — OXALIPLATIN CHEMO INJECTION 100 MG/20ML
70.0000 mg/m2 | Freq: Once | INTRAVENOUS | Status: AC
  Administered 2022-06-08: 160 mg via INTRAVENOUS
  Filled 2022-06-08: qty 32

## 2022-06-08 MED ORDER — SODIUM CHLORIDE 0.9 % IV SOLN
400.0000 mg/m2 | Freq: Once | INTRAVENOUS | Status: AC
  Administered 2022-06-08: 904 mg via INTRAVENOUS
  Filled 2022-06-08: qty 45.2

## 2022-06-08 MED ORDER — SODIUM CHLORIDE 0.9 % IV SOLN
150.0000 mg | Freq: Once | INTRAVENOUS | Status: AC
  Administered 2022-06-08: 150 mg via INTRAVENOUS
  Filled 2022-06-08: qty 150

## 2022-06-08 MED ORDER — ATROPINE SULFATE 1 MG/ML IV SOLN
0.5000 mg | Freq: Once | INTRAVENOUS | Status: AC | PRN
  Administered 2022-06-08: 0.5 mg via INTRAVENOUS
  Filled 2022-06-08: qty 1

## 2022-06-08 MED ORDER — PALONOSETRON HCL INJECTION 0.25 MG/5ML
0.2500 mg | Freq: Once | INTRAVENOUS | Status: AC
  Administered 2022-06-08: 0.25 mg via INTRAVENOUS
  Filled 2022-06-08: qty 5

## 2022-06-08 MED ORDER — DEXTROSE 5 % IV SOLN: Freq: Once | INTRAVENOUS | Status: AC

## 2022-06-08 MED ORDER — SODIUM CHLORIDE 0.9 % IV SOLN
110.0000 mg/m2 | Freq: Once | INTRAVENOUS | Status: AC
  Administered 2022-06-08: 240 mg via INTRAVENOUS
  Filled 2022-06-08: qty 12

## 2022-06-08 MED ORDER — SODIUM CHLORIDE 0.9 % IV SOLN
2400.0000 mg/m2 | INTRAVENOUS | Status: DC
  Administered 2022-06-08: 5400 mg via INTRAVENOUS
  Filled 2022-06-08: qty 108

## 2022-06-08 MED ORDER — SODIUM CHLORIDE 0.9% FLUSH
10.0000 mL | Freq: Once | INTRAVENOUS | Status: AC
  Administered 2022-06-08: 10 mL
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
## 2022-06-08 NOTE — Addendum Note (Signed)
Addendum  created 06/08/22 0710 by West Pugh, CRNA   Intraprocedure Event edited

## 2022-06-08 NOTE — Patient Instructions (Signed)
[facility]   Discharge Instructions: Thank you for choosing [facility] to provide your oncology and hematology care.   If you have a lab appointment with the [facility], please go directly to the [facility] and check in at the registration area.   Wear comfortable clothing and clothing appropriate for easy access to any Portacath or PICC line.   We strive to give you quality time with your provider. You may need to reschedule your appointment if you arrive late (15 or more minutes).  Arriving late affects you and other patients whose appointments are after yours.  Also, if you miss three or more appointments without notifying the office, you may be dismissed from the clinic at the provider's discretion.      For prescription refill requests, have your pharmacy contact our office and allow 72 hours for refills to be completed.    Today you received the following chemotherapy and/or immunotherapy agents: oxaliplatin, leucovorin, irinotecan, fluorouracil      To help prevent nausea and vomiting after your treatment, we encourage you to take your nausea medication as directed.  BELOW ARE SYMPTOMS THAT SHOULD BE REPORTED IMMEDIATELY: *FEVER GREATER THAN 100.4 F (38 C) OR HIGHER *CHILLS OR SWEATING *NAUSEA AND VOMITING THAT IS NOT CONTROLLED WITH YOUR NAUSEA MEDICATION *UNUSUAL SHORTNESS OF BREATH *UNUSUAL BRUISING OR BLEEDING *URINARY PROBLEMS (pain or burning when urinating, or frequent urination) *BOWEL PROBLEMS (unusual diarrhea, constipation, pain near the anus) TENDERNESS IN MOUTH AND THROAT WITH OR WITHOUT PRESENCE OF ULCERS (sore throat, sores in mouth, or a toothache) UNUSUAL RASH, SWELLING OR PAIN  UNUSUAL VAGINAL DISCHARGE OR ITCHING   Items with * indicate a potential emergency and should be followed up as soon as possible or go to the Emergency Department if any problems should occur.  Please show the CHEMOTHERAPY ALERT CARD  or IMMUNOTHERAPY ALERT CARD at check-in to the Emergency Department and triage nurse.  Should you have questions after your visit or need to cancel or reschedule your appointment, please contact [facility]  Dept: [phone]  and follow the prompts.  Office hours are 8:00 a.m. to 4:30 p.m. Monday - Friday. Please note that voicemails left after 4:00 p.m. may not be returned until the following business day.  We are closed weekends and major holidays. You have access to a nurse at all times for urgent questions. Please call the main number to the clinic Dept: [phone] and follow the prompts.   For any non-urgent questions, you may also contact your provider using MyChart. We now offer e-Visits for anyone 49 and older to request care online for non-urgent symptoms. For details visit [email or web address].   Also download the MyChart app! Go to the app store, search "MyChart", open the app, select [facility], and log in with your MyChart username and password.  Masks are optional in the cancer centers. If you would like for your care team to wear a mask while they are taking care of you, please let them know. For doctor visits, patients may have with them one support person who is at least 78 years old. At this time, visitors are not allowed in the infusion area.

## 2022-06-10 ENCOUNTER — Inpatient Hospital Stay: Payer: Medicare Other

## 2022-06-10 ENCOUNTER — Other Ambulatory Visit: Payer: Self-pay

## 2022-06-10 VITALS — BP 149/71 | HR 80 | Temp 99.1°F | Resp 20

## 2022-06-10 DIAGNOSIS — C259 Malignant neoplasm of pancreas, unspecified: Secondary | ICD-10-CM

## 2022-06-10 DIAGNOSIS — Z5111 Encounter for antineoplastic chemotherapy: Secondary | ICD-10-CM | POA: Diagnosis not present

## 2022-06-10 DIAGNOSIS — C250 Malignant neoplasm of head of pancreas: Secondary | ICD-10-CM | POA: Diagnosis not present

## 2022-06-10 MED ORDER — HEPARIN SOD (PORK) LOCK FLUSH 100 UNIT/ML IV SOLN
500.0000 [IU] | Freq: Once | INTRAVENOUS | Status: AC | PRN
  Administered 2022-06-10: 500 [IU]

## 2022-06-10 MED ORDER — SODIUM CHLORIDE 0.9% FLUSH
10.0000 mL | INTRAVENOUS | Status: DC | PRN
  Administered 2022-06-10: 10 mL

## 2022-06-14 ENCOUNTER — Ambulatory Visit: Payer: Self-pay | Admitting: Genetic Counselor

## 2022-06-14 ENCOUNTER — Encounter: Payer: Self-pay | Admitting: Genetic Counselor

## 2022-06-14 DIAGNOSIS — Z1379 Encounter for other screening for genetic and chromosomal anomalies: Secondary | ICD-10-CM | POA: Insufficient documentation

## 2022-06-14 NOTE — Progress Notes (Signed)
Error

## 2022-06-15 ENCOUNTER — Telehealth: Payer: Self-pay | Admitting: Internal Medicine

## 2022-06-15 NOTE — Telephone Encounter (Signed)
   [name] DOB: 08/17/1946 MRN: [id number]   RIDER WAIVER AND RELEASE OF LIABILITY  For purposes of improving physical access to our facilities, [facility] is pleased to partner with third parties to provide [facility] patients or other authorized individuals the option of convenient, on-demand ground transportation services (the Technical brewer") through use of the technology service that enables users to request on-demand ground transportation from independent third-party providers.  By opting to use and accept these [employer], I, the undersigned, hereby agree on behalf of myself, and on behalf of any minor child using the Government social research officer for whom I am the parent or legal guardian, as follows:  Government social research officer provided to me are provided by independent third-party transportation providers who are not [employer] or employees and who are unaffiliated with [employer]. [facility] is neither a transportation carrier nor a common or public carrier. [facility] has no control over the quality or safety of the transportation that occurs as a result of the [employer]. [facility] cannot guarantee that any third-party transportation provider will complete any arranged transportation service. [facility] makes no representation, warranty, or guarantee regarding the reliability, timeliness, quality, safety, suitability, or availability of any of the Transport Services or that they will be error free. I fully understand that traveling by vehicle involves risks and dangers of serious bodily injury, including permanent disability, paralysis, and death. I agree, on behalf of myself and on behalf of any minor child using the Transport Services for whom I am the parent or legal guardian, that the entire risk arising out of my use of the [employer] remains solely with me, to the maximum extent permitted under applicable law. The [employer] are provided  "as is" and "as available." [facility] disclaims all representations and warranties, express, implied or statutory, not expressly set out in these terms, including the implied warranties of merchantability and fitness for a particular purpose. I hereby waive and release [facility], its agents, employees, officers, directors, representatives, insurers, attorneys, assigns, successors, subsidiaries, and affiliates from any and all past, present, or future claims, demands, liabilities, actions, causes of action, or suits of any kind directly or indirectly arising from acceptance and use of the [employer]. I further waive and release [facility] and its affiliates from all present and future liability and responsibility for any injury or death to persons or damages to property caused by or related to the use of the [employer]. I have read this Waiver and Release of Liability, and I understand the terms used in it and their legal significance. This Waiver is freely and voluntarily given with the understanding that my right (as well as the right of any minor child for whom I am the parent or legal guardian using the [employer]) to legal recourse against  in connection with the [employer] is knowingly surrendered in return for use of these services.   I attest that I read the consent document to [name], gave [name] the opportunity to ask questions and answered the questions asked (if any). I affirm that [name] then provided consent for he's participation in this program.     [name]

## 2022-06-21 MED FILL — Dexamethasone Sodium Phosphate Inj 100 MG/10ML: INTRAMUSCULAR | Qty: 1 | Status: AC

## 2022-06-21 MED FILL — Fosaprepitant Dimeglumine For IV Infusion 150 MG (Base Eq): INTRAVENOUS | Qty: 5 | Status: AC

## 2022-06-22 ENCOUNTER — Other Ambulatory Visit: Payer: Self-pay

## 2022-06-22 ENCOUNTER — Inpatient Hospital Stay: Payer: Medicare Other | Admitting: Hematology

## 2022-06-22 ENCOUNTER — Inpatient Hospital Stay: Payer: Medicare Other | Attending: Hematology

## 2022-06-22 ENCOUNTER — Encounter: Payer: Self-pay | Admitting: Hematology

## 2022-06-22 ENCOUNTER — Inpatient Hospital Stay: Payer: Medicare Other

## 2022-06-22 VITALS — BP 148/76 | HR 81 | Temp 99.0°F | Resp 18 | Ht 68.0 in | Wt 216.6 lb

## 2022-06-22 DIAGNOSIS — Z5111 Encounter for antineoplastic chemotherapy: Secondary | ICD-10-CM | POA: Insufficient documentation

## 2022-06-22 DIAGNOSIS — C259 Malignant neoplasm of pancreas, unspecified: Secondary | ICD-10-CM

## 2022-06-22 DIAGNOSIS — C250 Malignant neoplasm of head of pancreas: Secondary | ICD-10-CM | POA: Insufficient documentation

## 2022-06-22 DIAGNOSIS — Z95828 Presence of other vascular implants and grafts: Secondary | ICD-10-CM

## 2022-06-22 LAB — CMP (CANCER CENTER ONLY)
ALT: 15 U/L (ref 0–44)
AST: 14 U/L — ABNORMAL LOW (ref 15–41)
Albumin: 3.7 g/dL (ref 3.5–5.0)
Alkaline Phosphatase: 101 U/L (ref 38–126)
Anion gap: 4 — ABNORMAL LOW (ref 5–15)
BUN: 15 mg/dL (ref 8–23)
CO2: 28 mmol/L (ref 22–32)
Calcium: 8.8 mg/dL — ABNORMAL LOW (ref 8.9–10.3)
Chloride: 104 mmol/L (ref 98–111)
Creatinine: 1.02 mg/dL (ref 0.61–1.24)
GFR, Estimated: >60 mL/min (ref >60)
Glucose, Bld: 131 mg/dL — ABNORMAL HIGH (ref 70–99)
Potassium: 4.1 mmol/L (ref 3.5–5.1)
Sodium: 136 mmol/L (ref 135–145)
Total Bilirubin: 0.9 mg/dL (ref 0.3–1.2)
Total Protein: 6.6 g/dL (ref 6.5–8.1)

## 2022-06-22 LAB — CBC WITH DIFFERENTIAL (CANCER CENTER ONLY)
Abs Immature Granulocytes: 0.04 10*3/uL (ref 0.00–0.07)
Basophils Absolute: 0 10*3/uL (ref 0.0–0.1)
Basophils Relative: 0 %
Eosinophils Absolute: 0 10*3/uL (ref 0.0–0.5)
Eosinophils Relative: 0 %
HCT: 34.4 % — ABNORMAL LOW (ref 39.0–52.0)
Hemoglobin: 12 g/dL — ABNORMAL LOW (ref 13.0–17.0)
Immature Granulocytes: 1 %
Lymphocytes Relative: 10 %
Lymphs Abs: 0.8 10*3/uL (ref 0.7–4.0)
MCH: 29.3 pg (ref 26.0–34.0)
MCHC: 34.9 g/dL (ref 30.0–36.0)
MCV: 83.9 fL (ref 80.0–100.0)
Monocytes Absolute: 1 10*3/uL (ref 0.1–1.0)
Monocytes Relative: 12 %
Neutro Abs: 6.2 10*3/uL (ref 1.7–7.7)
Neutrophils Relative %: 77 %
Platelet Count: 152 10*3/uL (ref 150–400)
RBC: 4.1 MIL/uL — ABNORMAL LOW (ref 4.22–5.81)
RDW: 13.7 % (ref 11.5–15.5)
WBC Count: 8 10*3/uL (ref 4.0–10.5)
nRBC: 0 % (ref 0.0–0.2)

## 2022-06-22 MED ORDER — SODIUM CHLORIDE 0.9% FLUSH: 10.0000 mL | INTRAVENOUS | Status: DC | PRN

## 2022-06-22 MED ORDER — SODIUM CHLORIDE 0.9 % IV SOLN
10.0000 mg | Freq: Once | INTRAVENOUS | Status: AC
  Administered 2022-06-22: 10 mg via INTRAVENOUS
  Filled 2022-06-22: qty 10

## 2022-06-22 MED ORDER — DEXTROSE 5 % IV SOLN: Freq: Once | INTRAVENOUS | Status: AC

## 2022-06-22 MED ORDER — PALONOSETRON HCL INJECTION 0.25 MG/5ML
0.2500 mg | Freq: Once | INTRAVENOUS | Status: AC
  Administered 2022-06-22: 0.25 mg via INTRAVENOUS
  Filled 2022-06-22: qty 5

## 2022-06-22 MED ORDER — SODIUM CHLORIDE 0.9 % IV SOLN
130.0000 mg/m2 | Freq: Once | INTRAVENOUS | Status: AC
  Administered 2022-06-22: 300 mg via INTRAVENOUS
  Filled 2022-06-22: qty 15

## 2022-06-22 MED ORDER — SODIUM CHLORIDE 0.9% FLUSH
10.0000 mL | Freq: Once | INTRAVENOUS | Status: AC
  Administered 2022-06-22: 10 mL

## 2022-06-22 MED ORDER — ATROPINE SULFATE 1 MG/ML IV SOLN
0.5000 mg | Freq: Once | INTRAVENOUS | Status: AC | PRN
  Administered 2022-06-22: 0.5 mg via INTRAVENOUS
  Filled 2022-06-22: qty 1

## 2022-06-22 MED ORDER — OXALIPLATIN CHEMO INJECTION 100 MG/20ML
70.0000 mg/m2 | Freq: Once | INTRAVENOUS | Status: AC
  Administered 2022-06-22: 160 mg via INTRAVENOUS
  Filled 2022-06-22: qty 32

## 2022-06-22 MED ORDER — SODIUM CHLORIDE 0.9 % IV SOLN
150.0000 mg | Freq: Once | INTRAVENOUS | Status: AC
  Administered 2022-06-22: 150 mg via INTRAVENOUS
  Filled 2022-06-22: qty 150

## 2022-06-22 MED ORDER — SODIUM CHLORIDE 0.9 % IV SOLN
2400.0000 mg/m2 | INTRAVENOUS | Status: DC
  Administered 2022-06-22: 5400 mg via INTRAVENOUS
  Filled 2022-06-22: qty 108

## 2022-06-22 MED ORDER — SODIUM CHLORIDE 0.9 % IV SOLN
400.0000 mg/m2 | Freq: Once | INTRAVENOUS | Status: AC
  Administered 2022-06-22: 904 mg via INTRAVENOUS
  Filled 2022-06-22: qty 45.2
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
## 2022-06-22 NOTE — Progress Notes (Signed)
Leucovorin to run over 90 minutes w/ irinotecan

## 2022-06-22 NOTE — Patient Instructions (Signed)
[facility] MEDICAL ONCOLOGY  Discharge Instructions: Thank you for choosing [facility] to provide your oncology and hematology care.   If you have a lab appointment with the Cancer Center, please go directly to the Cancer Center and check in at the registration area.   Wear comfortable clothing and clothing appropriate for easy access to any Portacath or PICC line.   We strive to give you quality time with your provider. You may need to reschedule your appointment if you arrive late (15 or more minutes).  Arriving late affects you and other patients whose appointments are after yours.  Also, if you miss three or more appointments without notifying the office, you may be dismissed from the clinic at the provider's discretion.      For prescription refill requests, have your pharmacy contact our office and allow 72 hours for refills to be completed.    Today you received the following chemotherapy and/or immunotherapy agents Oxaliplatin, Irinotecan, Leucovorin, 5FU pump      To help prevent nausea and vomiting after your treatment, we encourage you to take your nausea medication as directed.  BELOW ARE SYMPTOMS THAT SHOULD BE REPORTED IMMEDIATELY: *FEVER GREATER THAN 100.4 F (38 C) OR HIGHER *CHILLS OR SWEATING *NAUSEA AND VOMITING THAT IS NOT CONTROLLED WITH YOUR NAUSEA MEDICATION *UNUSUAL SHORTNESS OF BREATH *UNUSUAL BRUISING OR BLEEDING *URINARY PROBLEMS (pain or burning when urinating, or frequent urination) *BOWEL PROBLEMS (unusual diarrhea, constipation, pain near the anus) TENDERNESS IN MOUTH AND THROAT WITH OR WITHOUT PRESENCE OF ULCERS (sore throat, sores in mouth, or a toothache) UNUSUAL RASH, SWELLING OR PAIN  UNUSUAL VAGINAL DISCHARGE OR ITCHING   Items with * indicate a potential emergency and should be followed up as soon as possible or go to the Emergency Department if any problems should occur.  Please show the CHEMOTHERAPY ALERT CARD or  IMMUNOTHERAPY ALERT CARD at check-in to the Emergency Department and triage nurse.  Should you have questions after your visit or need to cancel or reschedule your appointment, please contact [facility]  Dept: [phone]  and follow the prompts.  Office hours are 8:00 a.m. to 4:30 p.m. Monday - Friday. Please note that voicemails left after 4:00 p.m. may not be returned until the following business day.  We are closed weekends and major holidays. You have access to a nurse at all times for urgent questions. Please call the main number to the clinic Dept: [phone] and follow the prompts.   For any non-urgent questions, you may also contact your provider using MyChart. We now offer e-Visits for anyone 18 and older to request care online for non-urgent symptoms. For details visit [email or web address].   Also download the MyChart app! Go to the app store, search "MyChart", open the app, select [facility], and log in with your MyChart username and password.  Masks are optional in the cancer centers. If you would like for your care team to wear a mask while they are taking care of you, please let them know. You may have one support person who is at least 78 years old accompany you for your appointments. 

## 2022-06-24 ENCOUNTER — Inpatient Hospital Stay: Payer: Medicare Other

## 2022-06-24 ENCOUNTER — Other Ambulatory Visit: Payer: Self-pay

## 2022-06-24 VITALS — BP 146/65 | HR 75 | Resp 18

## 2022-06-24 DIAGNOSIS — C250 Malignant neoplasm of head of pancreas: Secondary | ICD-10-CM | POA: Diagnosis not present

## 2022-06-24 DIAGNOSIS — Z5111 Encounter for antineoplastic chemotherapy: Secondary | ICD-10-CM | POA: Diagnosis not present

## 2022-06-24 DIAGNOSIS — C259 Malignant neoplasm of pancreas, unspecified: Secondary | ICD-10-CM

## 2022-06-24 MED ORDER — HEPARIN SOD (PORK) LOCK FLUSH 100 UNIT/ML IV SOLN
500.0000 [IU] | Freq: Once | INTRAVENOUS | Status: AC | PRN
  Administered 2022-06-24: 500 [IU]

## 2022-06-24 MED ORDER — SODIUM CHLORIDE 0.9% FLUSH
10.0000 mL | INTRAVENOUS | Status: DC | PRN
  Administered 2022-06-24: 10 mL

## 2022-07-06 ENCOUNTER — Inpatient Hospital Stay: Payer: Medicare Other

## 2022-07-06 ENCOUNTER — Other Ambulatory Visit: Payer: Self-pay

## 2022-07-06 ENCOUNTER — Inpatient Hospital Stay: Payer: Medicare Other | Admitting: Hematology

## 2022-07-06 ENCOUNTER — Encounter: Payer: Self-pay | Admitting: Hematology

## 2022-07-06 VITALS — BP 158/80 | HR 68 | Temp 98.3°F | Resp 16 | Ht 68.0 in | Wt 216.1 lb

## 2022-07-06 DIAGNOSIS — Z5111 Encounter for antineoplastic chemotherapy: Secondary | ICD-10-CM | POA: Diagnosis not present

## 2022-07-06 DIAGNOSIS — C250 Malignant neoplasm of head of pancreas: Secondary | ICD-10-CM | POA: Diagnosis not present

## 2022-07-06 DIAGNOSIS — C259 Malignant neoplasm of pancreas, unspecified: Secondary | ICD-10-CM

## 2022-07-06 DIAGNOSIS — Z95828 Presence of other vascular implants and grafts: Secondary | ICD-10-CM

## 2022-07-06 LAB — CBC WITH DIFFERENTIAL (CANCER CENTER ONLY)
Abs Immature Granulocytes: 0.01 10*3/uL (ref 0.00–0.07)
Basophils Absolute: 0 10*3/uL (ref 0.0–0.1)
Basophils Relative: 1 %
Eosinophils Absolute: 0.4 10*3/uL (ref 0.0–0.5)
Eosinophils Relative: 15 %
HCT: 33.8 % — ABNORMAL LOW (ref 39.0–52.0)
Hemoglobin: 11.9 g/dL — ABNORMAL LOW (ref 13.0–17.0)
Immature Granulocytes: 0 %
Lymphocytes Relative: 42 %
Lymphs Abs: 1 10*3/uL (ref 0.7–4.0)
MCH: 29.2 pg (ref 26.0–34.0)
MCHC: 35.2 g/dL (ref 30.0–36.0)
MCV: 82.8 fL (ref 80.0–100.0)
Monocytes Absolute: 0.4 10*3/uL (ref 0.1–1.0)
Monocytes Relative: 17 %
Neutro Abs: 0.6 10*3/uL — ABNORMAL LOW (ref 1.7–7.7)
Neutrophils Relative %: 25 %
Platelet Count: 149 10*3/uL — ABNORMAL LOW (ref 150–400)
RBC: 4.08 MIL/uL — ABNORMAL LOW (ref 4.22–5.81)
RDW: 14.2 % (ref 11.5–15.5)
WBC Count: 2.4 10*3/uL — ABNORMAL LOW (ref 4.0–10.5)
nRBC: 0 % (ref 0.0–0.2)

## 2022-07-06 LAB — CMP (CANCER CENTER ONLY)
ALT: 22 U/L (ref 0–44)
AST: 20 U/L (ref 15–41)
Albumin: 3.8 g/dL (ref 3.5–5.0)
Alkaline Phosphatase: 89 U/L (ref 38–126)
Anion gap: 3 — ABNORMAL LOW (ref 5–15)
BUN: 13 mg/dL (ref 8–23)
CO2: 30 mmol/L (ref 22–32)
Calcium: 9.3 mg/dL (ref 8.9–10.3)
Chloride: 108 mmol/L (ref 98–111)
Creatinine: 0.98 mg/dL (ref 0.61–1.24)
GFR, Estimated: >60 mL/min (ref >60)
Glucose, Bld: 114 mg/dL — ABNORMAL HIGH (ref 70–99)
Potassium: 4 mmol/L (ref 3.5–5.1)
Sodium: 141 mmol/L (ref 135–145)
Total Bilirubin: 0.7 mg/dL (ref 0.3–1.2)
Total Protein: 6.2 g/dL — ABNORMAL LOW (ref 6.5–8.1)

## 2022-07-06 MED ORDER — SODIUM CHLORIDE 0.9% FLUSH
10.0000 mL | Freq: Once | INTRAVENOUS | Status: AC
  Administered 2022-07-06: 10 mL
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
## 2022-07-06 NOTE — Addendum Note (Signed)
Addended by: Margaret Pyle on: 07/06/2022 11:23 AM   Modules accepted: Orders

## 2022-07-08 ENCOUNTER — Inpatient Hospital Stay: Payer: Medicare Other

## 2022-07-08 DIAGNOSIS — R3912 Poor urinary stream: Secondary | ICD-10-CM | POA: Diagnosis not present

## 2022-07-09 DIAGNOSIS — C259 Malignant neoplasm of pancreas, unspecified: Secondary | ICD-10-CM | POA: Diagnosis not present

## 2022-07-12 MED FILL — Dexamethasone Sodium Phosphate Inj 100 MG/10ML: INTRAMUSCULAR | Qty: 1 | Status: AC

## 2022-07-12 MED FILL — Fosaprepitant Dimeglumine For IV Infusion 150 MG (Base Eq): INTRAVENOUS | Qty: 5 | Status: AC

## 2022-07-13 ENCOUNTER — Other Ambulatory Visit: Payer: Self-pay

## 2022-07-13 ENCOUNTER — Inpatient Hospital Stay: Payer: Medicare Other

## 2022-07-13 VITALS — BP 148/78 | HR 68 | Temp 98.2°F | Resp 18 | Wt 212.8 lb

## 2022-07-13 DIAGNOSIS — Z95828 Presence of other vascular implants and grafts: Secondary | ICD-10-CM

## 2022-07-13 DIAGNOSIS — C250 Malignant neoplasm of head of pancreas: Secondary | ICD-10-CM | POA: Diagnosis not present

## 2022-07-13 DIAGNOSIS — Z5111 Encounter for antineoplastic chemotherapy: Secondary | ICD-10-CM | POA: Diagnosis not present

## 2022-07-13 DIAGNOSIS — C259 Malignant neoplasm of pancreas, unspecified: Secondary | ICD-10-CM

## 2022-07-13 LAB — CBC WITH DIFFERENTIAL (CANCER CENTER ONLY)
Abs Immature Granulocytes: 0.01 10*3/uL (ref 0.00–0.07)
Basophils Absolute: 0 10*3/uL (ref 0.0–0.1)
Basophils Relative: 1 %
Eosinophils Absolute: 0.1 10*3/uL (ref 0.0–0.5)
Eosinophils Relative: 3 %
HCT: 35.2 % — ABNORMAL LOW (ref 39.0–52.0)
Hemoglobin: 12 g/dL — ABNORMAL LOW (ref 13.0–17.0)
Immature Granulocytes: 0 %
Lymphocytes Relative: 20 %
Lymphs Abs: 0.7 10*3/uL (ref 0.7–4.0)
MCH: 28.6 pg (ref 26.0–34.0)
MCHC: 34.1 g/dL (ref 30.0–36.0)
MCV: 84 fL (ref 80.0–100.0)
Monocytes Absolute: 0.7 10*3/uL (ref 0.1–1.0)
Monocytes Relative: 20 %
Neutro Abs: 1.9 10*3/uL (ref 1.7–7.7)
Neutrophils Relative %: 56 %
Platelet Count: 145 10*3/uL — ABNORMAL LOW (ref 150–400)
RBC: 4.19 MIL/uL — ABNORMAL LOW (ref 4.22–5.81)
RDW: 15 % (ref 11.5–15.5)
WBC Count: 3.4 10*3/uL — ABNORMAL LOW (ref 4.0–10.5)
nRBC: 0 % (ref 0.0–0.2)

## 2022-07-13 LAB — CMP (CANCER CENTER ONLY)
ALT: 53 U/L — ABNORMAL HIGH (ref 0–44)
AST: 59 U/L — ABNORMAL HIGH (ref 15–41)
Albumin: 3.9 g/dL (ref 3.5–5.0)
Alkaline Phosphatase: 165 U/L — ABNORMAL HIGH (ref 38–126)
Anion gap: 4 — ABNORMAL LOW (ref 5–15)
BUN: 11 mg/dL (ref 8–23)
CO2: 30 mmol/L (ref 22–32)
Calcium: 9.4 mg/dL (ref 8.9–10.3)
Chloride: 106 mmol/L (ref 98–111)
Creatinine: 1.14 mg/dL (ref 0.61–1.24)
GFR, Estimated: >60 mL/min (ref >60)
Glucose, Bld: 112 mg/dL — ABNORMAL HIGH (ref 70–99)
Potassium: 4.3 mmol/L (ref 3.5–5.1)
Sodium: 140 mmol/L (ref 135–145)
Total Bilirubin: 0.8 mg/dL (ref 0.3–1.2)
Total Protein: 6.4 g/dL — ABNORMAL LOW (ref 6.5–8.1)

## 2022-07-13 MED ORDER — PALONOSETRON HCL INJECTION 0.25 MG/5ML
0.2500 mg | Freq: Once | INTRAVENOUS | Status: AC
  Administered 2022-07-13: 0.25 mg via INTRAVENOUS
  Filled 2022-07-13: qty 5

## 2022-07-13 MED ORDER — SODIUM CHLORIDE 0.9 % IV SOLN
400.0000 mg/m2 | Freq: Once | INTRAVENOUS | Status: AC
  Administered 2022-07-13: 868 mg via INTRAVENOUS
  Filled 2022-07-13: qty 43.4

## 2022-07-13 MED ORDER — SODIUM CHLORIDE 0.9% FLUSH
10.0000 mL | Freq: Once | INTRAVENOUS | Status: AC
  Administered 2022-07-13: 10 mL

## 2022-07-13 MED ORDER — ATROPINE SULFATE 1 MG/ML IV SOLN
0.5000 mg | Freq: Once | INTRAVENOUS | Status: AC | PRN
  Administered 2022-07-13: 0.5 mg via INTRAVENOUS
  Filled 2022-07-13: qty 1

## 2022-07-13 MED ORDER — OXALIPLATIN CHEMO INJECTION 100 MG/20ML
70.0000 mg/m2 | Freq: Once | INTRAVENOUS | Status: AC
  Administered 2022-07-13: 150 mg via INTRAVENOUS
  Filled 2022-07-13: qty 20

## 2022-07-13 MED ORDER — SODIUM CHLORIDE 0.9 % IV SOLN
130.0000 mg/m2 | Freq: Once | INTRAVENOUS | Status: AC
  Administered 2022-07-13: 280 mg via INTRAVENOUS
  Filled 2022-07-13: qty 14

## 2022-07-13 MED ORDER — DEXTROSE 5 % IV SOLN: Freq: Once | INTRAVENOUS | Status: AC

## 2022-07-13 MED ORDER — SODIUM CHLORIDE 0.9 % IV SOLN
10.0000 mg | Freq: Once | INTRAVENOUS | Status: AC
  Administered 2022-07-13: 10 mg via INTRAVENOUS
  Filled 2022-07-13: qty 10

## 2022-07-13 MED ORDER — SODIUM CHLORIDE 0.9 % IV SOLN
2400.0000 mg/m2 | INTRAVENOUS | Status: DC
  Administered 2022-07-13: 5200 mg via INTRAVENOUS
  Filled 2022-07-13: qty 104

## 2022-07-13 MED ORDER — SODIUM CHLORIDE 0.9 % IV SOLN
150.0000 mg | Freq: Once | INTRAVENOUS | Status: AC
  Administered 2022-07-13: 150 mg via INTRAVENOUS
  Filled 2022-07-13: qty 150

## 2022-07-14 DIAGNOSIS — C250 Malignant neoplasm of head of pancreas: Secondary | ICD-10-CM | POA: Diagnosis not present

## 2022-07-14 DIAGNOSIS — C259 Malignant neoplasm of pancreas, unspecified: Secondary | ICD-10-CM | POA: Diagnosis not present

## 2022-07-15 ENCOUNTER — Inpatient Hospital Stay: Payer: Medicare Other

## 2022-07-15 ENCOUNTER — Ambulatory Visit: Payer: Medicare Other | Admitting: Internal Medicine

## 2022-07-15 ENCOUNTER — Other Ambulatory Visit: Payer: Self-pay

## 2022-07-15 VITALS — BP 128/55 | HR 84 | Temp 98.7°F | Resp 18

## 2022-07-15 DIAGNOSIS — Z5111 Encounter for antineoplastic chemotherapy: Secondary | ICD-10-CM | POA: Diagnosis not present

## 2022-07-15 DIAGNOSIS — Z95828 Presence of other vascular implants and grafts: Secondary | ICD-10-CM

## 2022-07-15 DIAGNOSIS — C250 Malignant neoplasm of head of pancreas: Secondary | ICD-10-CM | POA: Diagnosis not present

## 2022-07-15 DIAGNOSIS — C259 Malignant neoplasm of pancreas, unspecified: Secondary | ICD-10-CM

## 2022-07-15 MED ORDER — HEPARIN SOD (PORK) LOCK FLUSH 100 UNIT/ML IV SOLN
500.0000 [IU] | Freq: Once | INTRAVENOUS | Status: AC
  Administered 2022-07-15: 500 [IU]

## 2022-07-15 MED ORDER — SODIUM CHLORIDE 0.9% FLUSH
10.0000 mL | Freq: Once | INTRAVENOUS | Status: AC
  Administered 2022-07-15: 10 mL

## 2022-07-20 ENCOUNTER — Encounter: Payer: Self-pay | Admitting: Licensed Clinical Social Worker

## 2022-07-20 DIAGNOSIS — C259 Malignant neoplasm of pancreas, unspecified: Secondary | ICD-10-CM

## 2022-07-20 NOTE — Progress Notes (Signed)
[facility] CSW Progress Note  Clinical Education officer, museum  received a message to call patient.  Pt requested CSW contact his daughter Odette Horns [phone]) regarding questions about paperwork for her to take FMLA from her job.  CSW called Clarissa who informed CSW she dropped off the needed paperwork at reception to be forwarded and completed.  No further needs at this time.  CSW to remain available to assist pt as appropriate throughout duration of treatment.      Henriette Combs, LCSW

## 2022-07-21 ENCOUNTER — Inpatient Hospital Stay: Payer: Medicare Other

## 2022-07-21 ENCOUNTER — Other Ambulatory Visit: Payer: Medicare Other

## 2022-07-21 ENCOUNTER — Inpatient Hospital Stay: Payer: Medicare Other | Admitting: Hematology

## 2022-07-21 ENCOUNTER — Ambulatory Visit: Payer: Medicare Other | Admitting: Hematology

## 2022-07-21 ENCOUNTER — Encounter: Payer: Medicare Other | Admitting: Genetic Counselor

## 2022-07-23 ENCOUNTER — Other Ambulatory Visit: Payer: Self-pay | Admitting: Internal Medicine

## 2022-07-23 ENCOUNTER — Inpatient Hospital Stay: Payer: Medicare Other

## 2022-07-23 DIAGNOSIS — E785 Hyperlipidemia, unspecified: Secondary | ICD-10-CM

## 2022-07-27 MED FILL — Dexamethasone Sodium Phosphate Inj 100 MG/10ML: INTRAMUSCULAR | Qty: 1 | Status: AC

## 2022-07-27 MED FILL — Fosaprepitant Dimeglumine For IV Infusion 150 MG (Base Eq): INTRAVENOUS | Qty: 5 | Status: AC

## 2022-07-28 ENCOUNTER — Inpatient Hospital Stay: Payer: Medicare Other

## 2022-07-28 ENCOUNTER — Inpatient Hospital Stay: Payer: Medicare Other | Admitting: Hematology

## 2022-07-28 ENCOUNTER — Other Ambulatory Visit: Payer: Self-pay

## 2022-07-28 ENCOUNTER — Encounter: Payer: Self-pay | Admitting: Hematology

## 2022-07-28 ENCOUNTER — Inpatient Hospital Stay: Payer: Medicare Other | Attending: Hematology

## 2022-07-28 VITALS — BP 154/72 | HR 75 | Temp 98.6°F | Resp 17 | Wt 214.8 lb

## 2022-07-28 DIAGNOSIS — C259 Malignant neoplasm of pancreas, unspecified: Secondary | ICD-10-CM | POA: Diagnosis not present

## 2022-07-28 DIAGNOSIS — Z5111 Encounter for antineoplastic chemotherapy: Secondary | ICD-10-CM | POA: Diagnosis not present

## 2022-07-28 DIAGNOSIS — Z95828 Presence of other vascular implants and grafts: Secondary | ICD-10-CM

## 2022-07-28 DIAGNOSIS — T451X5A Adverse effect of antineoplastic and immunosuppressive drugs, initial encounter: Secondary | ICD-10-CM | POA: Diagnosis not present

## 2022-07-28 DIAGNOSIS — D701 Agranulocytosis secondary to cancer chemotherapy: Secondary | ICD-10-CM | POA: Diagnosis not present

## 2022-07-28 DIAGNOSIS — C250 Malignant neoplasm of head of pancreas: Secondary | ICD-10-CM | POA: Diagnosis not present

## 2022-07-28 LAB — CMP (CANCER CENTER ONLY)
ALT: 18 U/L (ref 0–44)
AST: 21 U/L (ref 15–41)
Albumin: 3.7 g/dL (ref 3.5–5.0)
Alkaline Phosphatase: 104 U/L (ref 38–126)
Anion gap: 3 — ABNORMAL LOW (ref 5–15)
BUN: 13 mg/dL (ref 8–23)
CO2: 29 mmol/L (ref 22–32)
Calcium: 9 mg/dL (ref 8.9–10.3)
Chloride: 109 mmol/L (ref 98–111)
Creatinine: 0.96 mg/dL (ref 0.61–1.24)
GFR, Estimated: >60 mL/min (ref >60)
Glucose, Bld: 114 mg/dL — ABNORMAL HIGH (ref 70–99)
Potassium: 4.3 mmol/L (ref 3.5–5.1)
Sodium: 141 mmol/L (ref 135–145)
Total Bilirubin: 0.5 mg/dL (ref 0.3–1.2)
Total Protein: 6.1 g/dL — ABNORMAL LOW (ref 6.5–8.1)

## 2022-07-28 LAB — CBC WITH DIFFERENTIAL (CANCER CENTER ONLY)
Abs Immature Granulocytes: 0 10*3/uL (ref 0.00–0.07)
Basophils Absolute: 0 10*3/uL (ref 0.0–0.1)
Basophils Relative: 1 %
Eosinophils Absolute: 0.1 10*3/uL (ref 0.0–0.5)
Eosinophils Relative: 2 %
HCT: 31.6 % — ABNORMAL LOW (ref 39.0–52.0)
Hemoglobin: 10.7 g/dL — ABNORMAL LOW (ref 13.0–17.0)
Immature Granulocytes: 0 %
Lymphocytes Relative: 37 %
Lymphs Abs: 1 10*3/uL (ref 0.7–4.0)
MCH: 28.5 pg (ref 26.0–34.0)
MCHC: 33.9 g/dL (ref 30.0–36.0)
MCV: 84.3 fL (ref 80.0–100.0)
Monocytes Absolute: 0.5 10*3/uL (ref 0.1–1.0)
Monocytes Relative: 20 %
Neutro Abs: 1 10*3/uL — ABNORMAL LOW (ref 1.7–7.7)
Neutrophils Relative %: 40 %
Platelet Count: 175 10*3/uL (ref 150–400)
RBC: 3.75 MIL/uL — ABNORMAL LOW (ref 4.22–5.81)
RDW: 14.6 % (ref 11.5–15.5)
WBC Count: 2.6 10*3/uL — ABNORMAL LOW (ref 4.0–10.5)
nRBC: 0 % (ref 0.0–0.2)

## 2022-07-28 MED ORDER — SODIUM CHLORIDE 0.9 % IV SOLN
400.0000 mg/m2 | Freq: Once | INTRAVENOUS | Status: AC
  Administered 2022-07-28: 868 mg via INTRAVENOUS
  Filled 2022-07-28: qty 43.4

## 2022-07-28 MED ORDER — SODIUM CHLORIDE 0.9 % IV SOLN
10.0000 mg | Freq: Once | INTRAVENOUS | Status: AC
  Administered 2022-07-28: 10 mg via INTRAVENOUS
  Filled 2022-07-28: qty 10

## 2022-07-28 MED ORDER — SODIUM CHLORIDE 0.9 % IV SOLN
2400.0000 mg/m2 | INTRAVENOUS | Status: DC
  Administered 2022-07-28: 5200 mg via INTRAVENOUS
  Filled 2022-07-28: qty 104

## 2022-07-28 MED ORDER — SODIUM CHLORIDE 0.9 % IV SOLN
130.0000 mg/m2 | Freq: Once | INTRAVENOUS | Status: AC
  Administered 2022-07-28: 280 mg via INTRAVENOUS
  Filled 2022-07-28: qty 14

## 2022-07-28 MED ORDER — ATROPINE SULFATE 1 MG/ML IV SOLN
0.5000 mg | Freq: Once | INTRAVENOUS | Status: AC | PRN
  Administered 2022-07-28: 0.5 mg via INTRAVENOUS
  Filled 2022-07-28: qty 1

## 2022-07-28 MED ORDER — SODIUM CHLORIDE 0.9 % IV SOLN
150.0000 mg | Freq: Once | INTRAVENOUS | Status: AC
  Administered 2022-07-28: 150 mg via INTRAVENOUS
  Filled 2022-07-28: qty 150

## 2022-07-28 MED ORDER — SODIUM CHLORIDE 0.9% FLUSH
10.0000 mL | Freq: Once | INTRAVENOUS | Status: AC
  Administered 2022-07-28: 10 mL

## 2022-07-28 MED ORDER — OXALIPLATIN CHEMO INJECTION 100 MG/20ML
70.0000 mg/m2 | Freq: Once | INTRAVENOUS | Status: AC
  Administered 2022-07-28: 150 mg via INTRAVENOUS
  Filled 2022-07-28: qty 30

## 2022-07-28 MED ORDER — PALONOSETRON HCL INJECTION 0.25 MG/5ML
0.2500 mg | Freq: Once | INTRAVENOUS | Status: AC
  Administered 2022-07-28: 0.25 mg via INTRAVENOUS
  Filled 2022-07-28: qty 5

## 2022-07-28 MED ORDER — DEXTROSE 5 % IV SOLN: Freq: Once | INTRAVENOUS | Status: AC
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
## 2022-07-28 NOTE — Progress Notes (Signed)
Per Dr Burr Medico, ok to proceed with treatment today with ANC 1.0 K/uL.  Pt did not receive last dose of neulasta and will be rescheduled with pump d/c

## 2022-07-28 NOTE — Patient Instructions (Signed)

## 2022-07-30 ENCOUNTER — Inpatient Hospital Stay: Payer: Medicare Other

## 2022-07-30 VITALS — BP 140/65 | HR 87 | Temp 98.6°F | Resp 16

## 2022-07-30 DIAGNOSIS — D701 Agranulocytosis secondary to cancer chemotherapy: Secondary | ICD-10-CM | POA: Diagnosis not present

## 2022-07-30 DIAGNOSIS — C250 Malignant neoplasm of head of pancreas: Secondary | ICD-10-CM | POA: Diagnosis not present

## 2022-07-30 DIAGNOSIS — Z5111 Encounter for antineoplastic chemotherapy: Secondary | ICD-10-CM | POA: Diagnosis not present

## 2022-07-30 DIAGNOSIS — T451X5A Adverse effect of antineoplastic and immunosuppressive drugs, initial encounter: Secondary | ICD-10-CM | POA: Diagnosis not present

## 2022-07-30 DIAGNOSIS — C259 Malignant neoplasm of pancreas, unspecified: Secondary | ICD-10-CM

## 2022-07-30 MED ORDER — SODIUM CHLORIDE 0.9% FLUSH
10.0000 mL | INTRAVENOUS | Status: DC | PRN
  Administered 2022-07-30: 10 mL

## 2022-07-30 MED ORDER — PEGFILGRASTIM INJECTION 6 MG/0.6ML ~~LOC~~
6.0000 mg | PREFILLED_SYRINGE | Freq: Once | SUBCUTANEOUS | Status: AC
  Administered 2022-07-30: 6 mg via SUBCUTANEOUS
  Filled 2022-07-30: qty 0.6

## 2022-07-30 MED ORDER — HEPARIN SOD (PORK) LOCK FLUSH 100 UNIT/ML IV SOLN
500.0000 [IU] | Freq: Once | INTRAVENOUS | Status: AC | PRN
  Administered 2022-07-30: 500 [IU]

## 2022-07-30 NOTE — Patient Instructions (Signed)

## 2022-08-09 DIAGNOSIS — C259 Malignant neoplasm of pancreas, unspecified: Secondary | ICD-10-CM | POA: Diagnosis not present

## 2022-08-11 ENCOUNTER — Ambulatory Visit: Payer: Medicare Other | Admitting: Internal Medicine

## 2022-08-11 MED FILL — Dexamethasone Sodium Phosphate Inj 100 MG/10ML: INTRAMUSCULAR | Qty: 1 | Status: AC

## 2022-08-11 MED FILL — Fosaprepitant Dimeglumine For IV Infusion 150 MG (Base Eq): INTRAVENOUS | Qty: 5 | Status: AC

## 2022-08-12 ENCOUNTER — Inpatient Hospital Stay: Payer: Medicare Other

## 2022-08-12 ENCOUNTER — Inpatient Hospital Stay (HOSPITAL_BASED_OUTPATIENT_CLINIC_OR_DEPARTMENT_OTHER): Payer: Medicare Other | Admitting: Hematology

## 2022-08-12 ENCOUNTER — Encounter: Payer: Self-pay | Admitting: Hematology

## 2022-08-12 VITALS — BP 138/78 | HR 72 | Temp 98.2°F | Resp 18

## 2022-08-12 DIAGNOSIS — D701 Agranulocytosis secondary to cancer chemotherapy: Secondary | ICD-10-CM | POA: Diagnosis not present

## 2022-08-12 DIAGNOSIS — C250 Malignant neoplasm of head of pancreas: Secondary | ICD-10-CM | POA: Diagnosis not present

## 2022-08-12 DIAGNOSIS — Z5111 Encounter for antineoplastic chemotherapy: Secondary | ICD-10-CM | POA: Diagnosis not present

## 2022-08-12 DIAGNOSIS — C259 Malignant neoplasm of pancreas, unspecified: Secondary | ICD-10-CM

## 2022-08-12 DIAGNOSIS — T451X5A Adverse effect of antineoplastic and immunosuppressive drugs, initial encounter: Secondary | ICD-10-CM | POA: Diagnosis not present

## 2022-08-12 DIAGNOSIS — Z95828 Presence of other vascular implants and grafts: Secondary | ICD-10-CM

## 2022-08-12 LAB — CMP (CANCER CENTER ONLY)
ALT: 90 U/L — ABNORMAL HIGH (ref 0–44)
AST: 99 U/L — ABNORMAL HIGH (ref 15–41)
Albumin: 3.8 g/dL (ref 3.5–5.0)
Alkaline Phosphatase: 211 U/L — ABNORMAL HIGH (ref 38–126)
Anion gap: 7 (ref 5–15)
BUN: 14 mg/dL (ref 8–23)
CO2: 28 mmol/L (ref 22–32)
Calcium: 8.8 mg/dL — ABNORMAL LOW (ref 8.9–10.3)
Chloride: 106 mmol/L (ref 98–111)
Creatinine: 0.95 mg/dL (ref 0.61–1.24)
GFR, Estimated: >60 mL/min (ref >60)
Glucose, Bld: 103 mg/dL — ABNORMAL HIGH (ref 70–99)
Potassium: 4.4 mmol/L (ref 3.5–5.1)
Sodium: 141 mmol/L (ref 135–145)
Total Bilirubin: 0.5 mg/dL (ref 0.3–1.2)
Total Protein: 6.5 g/dL (ref 6.5–8.1)

## 2022-08-12 LAB — CBC WITH DIFFERENTIAL (CANCER CENTER ONLY)
Abs Immature Granulocytes: 0.67 10*3/uL — ABNORMAL HIGH (ref 0.00–0.07)
Basophils Absolute: 0.1 10*3/uL (ref 0.0–0.1)
Basophils Relative: 1 %
Eosinophils Absolute: 0.1 10*3/uL (ref 0.0–0.5)
Eosinophils Relative: 1 %
HCT: 33.1 % — ABNORMAL LOW (ref 39.0–52.0)
Hemoglobin: 11.1 g/dL — ABNORMAL LOW (ref 13.0–17.0)
Immature Granulocytes: 8 %
Lymphocytes Relative: 16 %
Lymphs Abs: 1.3 10*3/uL (ref 0.7–4.0)
MCH: 29.1 pg (ref 26.0–34.0)
MCHC: 33.5 g/dL (ref 30.0–36.0)
MCV: 86.6 fL (ref 80.0–100.0)
Monocytes Absolute: 0.8 10*3/uL (ref 0.1–1.0)
Monocytes Relative: 10 %
Neutro Abs: 5.2 10*3/uL (ref 1.7–7.7)
Neutrophils Relative %: 64 %
Platelet Count: 95 10*3/uL — ABNORMAL LOW (ref 150–400)
RBC: 3.82 MIL/uL — ABNORMAL LOW (ref 4.22–5.81)
RDW: 16.8 % — ABNORMAL HIGH (ref 11.5–15.5)
WBC Count: 8 10*3/uL (ref 4.0–10.5)
nRBC: 0.3 % — ABNORMAL HIGH (ref 0.0–0.2)

## 2022-08-12 MED ORDER — SODIUM CHLORIDE 0.9 % IV SOLN
400.0000 mg/m2 | Freq: Once | INTRAVENOUS | Status: AC
  Administered 2022-08-12: 868 mg via INTRAVENOUS
  Filled 2022-08-12: qty 43.4

## 2022-08-12 MED ORDER — SODIUM CHLORIDE 0.9 % IV SOLN
2300.0000 mg/m2 | INTRAVENOUS | Status: DC
  Administered 2022-08-12: 5000 mg via INTRAVENOUS
  Filled 2022-08-12: qty 100

## 2022-08-12 MED ORDER — OXALIPLATIN CHEMO INJECTION 100 MG/20ML
70.0000 mg/m2 | Freq: Once | INTRAVENOUS | Status: AC
  Administered 2022-08-12: 150 mg via INTRAVENOUS
  Filled 2022-08-12: qty 20

## 2022-08-12 MED ORDER — SODIUM CHLORIDE 0.9% FLUSH
10.0000 mL | Freq: Once | INTRAVENOUS | Status: AC
  Administered 2022-08-12: 10 mL

## 2022-08-12 MED ORDER — SODIUM CHLORIDE 0.9 % IV SOLN
10.0000 mg | Freq: Once | INTRAVENOUS | Status: AC
  Administered 2022-08-12: 10 mg via INTRAVENOUS
  Filled 2022-08-12: qty 10

## 2022-08-12 MED ORDER — PALONOSETRON HCL INJECTION 0.25 MG/5ML
0.2500 mg | Freq: Once | INTRAVENOUS | Status: AC
  Administered 2022-08-12: 0.25 mg via INTRAVENOUS
  Filled 2022-08-12: qty 5

## 2022-08-12 MED ORDER — SODIUM CHLORIDE 0.9 % IV SOLN
130.0000 mg/m2 | Freq: Once | INTRAVENOUS | Status: AC
  Administered 2022-08-12: 280 mg via INTRAVENOUS
  Filled 2022-08-12: qty 14

## 2022-08-12 MED ORDER — SODIUM CHLORIDE 0.9 % IV SOLN
150.0000 mg | Freq: Once | INTRAVENOUS | Status: AC
  Administered 2022-08-12: 150 mg via INTRAVENOUS
  Filled 2022-08-12: qty 150

## 2022-08-12 MED ORDER — DEXTROSE 5 % IV SOLN: Freq: Once | INTRAVENOUS | Status: AC

## 2022-08-12 MED ORDER — ATROPINE SULFATE 1 MG/ML IV SOLN
0.5000 mg | Freq: Once | INTRAVENOUS | Status: AC | PRN
  Administered 2022-08-12: 0.5 mg via INTRAVENOUS
  Filled 2022-08-12: qty 1

## 2022-08-12 NOTE — Progress Notes (Signed)
Per Dr Burr Medico ok to treat today with  plt count is 95 and his alt 90 and ast  99
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
## 2022-08-14 ENCOUNTER — Inpatient Hospital Stay: Payer: Medicare Other

## 2022-08-14 VITALS — BP 139/64 | HR 94 | Temp 97.2°F | Ht 68.0 in

## 2022-08-14 DIAGNOSIS — C250 Malignant neoplasm of head of pancreas: Secondary | ICD-10-CM | POA: Diagnosis not present

## 2022-08-14 DIAGNOSIS — C259 Malignant neoplasm of pancreas, unspecified: Secondary | ICD-10-CM

## 2022-08-14 DIAGNOSIS — Z5111 Encounter for antineoplastic chemotherapy: Secondary | ICD-10-CM | POA: Diagnosis not present

## 2022-08-14 DIAGNOSIS — D701 Agranulocytosis secondary to cancer chemotherapy: Secondary | ICD-10-CM | POA: Diagnosis not present

## 2022-08-14 DIAGNOSIS — T451X5A Adverse effect of antineoplastic and immunosuppressive drugs, initial encounter: Secondary | ICD-10-CM | POA: Diagnosis not present

## 2022-08-14 MED ORDER — SODIUM CHLORIDE 0.9% FLUSH
10.0000 mL | INTRAVENOUS | Status: DC | PRN
  Administered 2022-08-14: 10 mL

## 2022-08-14 MED ORDER — HEPARIN SOD (PORK) LOCK FLUSH 100 UNIT/ML IV SOLN
500.0000 [IU] | Freq: Once | INTRAVENOUS | Status: AC | PRN
  Administered 2022-08-14: 500 [IU]

## 2022-08-14 MED ORDER — PEGFILGRASTIM INJECTION 6 MG/0.6ML ~~LOC~~
6.0000 mg | PREFILLED_SYRINGE | Freq: Once | SUBCUTANEOUS | Status: AC
  Administered 2022-08-14: 6 mg via SUBCUTANEOUS

## 2022-08-24 ENCOUNTER — Encounter: Payer: Self-pay | Admitting: Internal Medicine

## 2022-08-24 NOTE — Patient Instructions (Addendum)
     Your sugars are well controlled    Medications changes include :   none    Return in about 6 months (around 02/24/2023) for follow up.  

## 2022-08-24 NOTE — Progress Notes (Unsigned)
Subjective:    Patient ID: [name], male    DOB: [birth year], 76 y.o.   MRN: [id number]     HPI [name] is here for follow up of his chronic medical problems, including htn, hld, prediabetes, asthma, pancreatic cancer  Since he was here last he was diagnosed with pancreatic cancer and is currently undergoing treatment.  He is currently doing neoadjuvant chemo with FOLFIRINOX  He is doing well with the treatment.  He is having fatigue.  He is eating well.    Medications and allergies reviewed with patient and updated if appropriate.  Current Outpatient Medications on File Prior to Visit  Medication Sig Dispense Refill   ACCU-CHEK GUIDE test strip USE TO check blood sugar DAILY AS DIRECTED 100 strip 3   acetaminophen (TYLENOL) 650 MG CR tablet Take 1,300 mg by mouth every 8 (eight) hours as needed for pain.     albuterol (VENTOLIN HFA) 108 (90 Base) MCG/ACT inhaler Inhale 2 puffs into the lungs every 6 (six) hours as needed for wheezing or shortness of breath. Inhale 2 puffs in the lungs every 4 hours as needed for cough, wheezing, SOB.     finasteride (PROSCAR) 5 MG tablet Take 5 mg by mouth daily.     Lancets MISC Test blood sugar daily. Dx code: 250.00 100 each 3   lidocaine-prilocaine (EMLA) cream Apply 1 Application topically as needed. 30 g 2   Multiple Vitamin (MULTIVITAMIN) capsule Take 1 capsule by mouth daily.     ondansetron (ZOFRAN) 8 MG tablet Take 1 tablet (8 mg total) by mouth every 8 (eight) hours as needed for nausea or vomiting. Starting 3 days after chemotherapy if needed 30 tablet 0   pantoprazole (PROTONIX) 40 MG tablet Take 1 tablet (40 mg total) by mouth daily. 30 tablet 1   prochlorperazine (COMPAZINE) 10 MG tablet Take 1 tablet (10 mg total) by mouth every 6 (six) hours as needed. 30 tablet 2   Propylene Glycol (SYSTANE COMPLETE OP) Place 1 drop into both eyes as needed (irritation).     simvastatin (ZOCOR) 20 MG tablet TAKE ONE TABLET BY MOUTH  EVERYDAY AT BEDTIME 90 tablet 2   silodosin (RAPAFLO) 8 MG CAPS capsule Take 8 mg by mouth daily.     No current facility-administered medications on file prior to visit.     Review of Systems  Constitutional:  Positive for fatigue.  Respiratory:  Negative for cough, shortness of breath and wheezing.   Cardiovascular:  Negative for chest pain, palpitations and leg swelling.  Gastrointestinal:  Negative for nausea.       No gerd  Genitourinary:  Negative for difficulty urinating.  Neurological:  Positive for numbness and headaches (occ). Negative for light-headedness.       Objective:   Vitals:   08/25/22 1547  BP: (!) 142/72  Pulse: 78  Temp: 98 F (36.7 C)  SpO2: 99%   BP Readings from Last 3 Encounters:  08/25/22 (!) 142/72  08/14/22 139/64  08/12/22 138/78   Wt Readings from Last 3 Encounters:  08/25/22 214 lb (97.1 kg)  07/28/22 214 lb 12 oz (97.4 kg)  07/13/22 212 lb 12 oz (96.5 kg)   Body mass index is 32.54 kg/m.    Physical Exam     Lab Results  Component Value Date   WBC 8.0 08/12/2022   HGB 11.1 (L) 08/12/2022   HCT 33.1 (L) 08/12/2022   PLT 95 (L) 08/12/2022   GLUCOSE 103 (  H) 08/12/2022   CHOL 119 02/04/2022   TRIG 77.0 02/04/2022   HDL 40.20 02/04/2022   LDLCALC 64 02/04/2022   ALT 90 (H) 08/12/2022   AST 99 (H) 08/12/2022   NA 141 08/12/2022   K 4.4 08/12/2022   CL 106 08/12/2022   CREATININE 0.95 08/12/2022   BUN 14 08/12/2022   CO2 28 08/12/2022   TSH 3.00 07/31/2019   PSA 1.09 07/22/2017   INR 0.9 06/03/2022   HGBA1C 5.7 (H) 06/03/2022   MICROALBUR 0.3 11/25/2015     Assessment & Plan:    See Problem List for Assessment and Plan of chronic medical problems.

## 2022-08-25 ENCOUNTER — Ambulatory Visit (INDEPENDENT_AMBULATORY_CARE_PROVIDER_SITE_OTHER): Payer: Medicare Other | Admitting: Internal Medicine

## 2022-08-25 VITALS — BP 142/72 | HR 78 | Temp 98.0°F | Ht 68.0 in | Wt 214.0 lb

## 2022-08-25 DIAGNOSIS — I10 Essential (primary) hypertension: Secondary | ICD-10-CM

## 2022-08-25 DIAGNOSIS — K219 Gastro-esophageal reflux disease without esophagitis: Secondary | ICD-10-CM

## 2022-08-25 DIAGNOSIS — J4520 Mild intermittent asthma, uncomplicated: Secondary | ICD-10-CM

## 2022-08-25 DIAGNOSIS — E785 Hyperlipidemia, unspecified: Secondary | ICD-10-CM | POA: Diagnosis not present

## 2022-08-25 DIAGNOSIS — C259 Malignant neoplasm of pancreas, unspecified: Secondary | ICD-10-CM

## 2022-08-25 DIAGNOSIS — N1831 Chronic kidney disease, stage 3a: Secondary | ICD-10-CM

## 2022-08-25 DIAGNOSIS — R7303 Prediabetes: Secondary | ICD-10-CM | POA: Diagnosis not present

## 2022-08-25 MED ORDER — ALBUTEROL SULFATE HFA 108 (90 BASE) MCG/ACT IN AERS: 2.0000 | INHALATION_SPRAY | Freq: Four times a day (QID) | RESPIRATORY_TRACT | 11 refills | Status: AC | PRN

## 2022-08-25 MED FILL — Fosaprepitant Dimeglumine For IV Infusion 150 MG (Base Eq): INTRAVENOUS | Qty: 5 | Status: AC

## 2022-08-25 MED FILL — Dexamethasone Sodium Phosphate Inj 100 MG/10ML: INTRAMUSCULAR | Qty: 1 | Status: AC

## 2022-08-25 NOTE — Assessment & Plan Note (Addendum)
Chronic Last A1c 05/2022 5.7% - no need to rechecking today Continue diabetic diet, regular exercise encouraged

## 2022-08-25 NOTE — Assessment & Plan Note (Signed)
Chronic Blood pressure well controlled Medication was discontinued-blood pressure currently controlled-monitor off medication

## 2022-08-25 NOTE — Assessment & Plan Note (Signed)
Management per dr Burr Medico On folfirinox

## 2022-08-25 NOTE — Assessment & Plan Note (Signed)
Ran out of pantoprazole - not taking anything Denies GERD monitor off medication

## 2022-08-25 NOTE — Assessment & Plan Note (Signed)
Chronic Regular exercise and healthy diet encouraged Continue simvastatin 20 mg daily 

## 2022-08-25 NOTE — Assessment & Plan Note (Signed)
Chronic Mild, intermittent Continue albuterol inhaler as needed 

## 2022-08-25 NOTE — Assessment & Plan Note (Signed)
Chronic Blood work monitored by oncology

## 2022-08-26 ENCOUNTER — Inpatient Hospital Stay: Payer: Medicare Other

## 2022-08-26 ENCOUNTER — Telehealth: Payer: Medicare Other

## 2022-08-26 ENCOUNTER — Inpatient Hospital Stay: Payer: Medicare Other | Attending: Hematology

## 2022-08-26 ENCOUNTER — Other Ambulatory Visit: Payer: Self-pay

## 2022-08-26 ENCOUNTER — Encounter: Payer: Self-pay | Admitting: Hematology

## 2022-08-26 ENCOUNTER — Inpatient Hospital Stay (HOSPITAL_BASED_OUTPATIENT_CLINIC_OR_DEPARTMENT_OTHER): Payer: Medicare Other | Admitting: Hematology

## 2022-08-26 VITALS — BP 160/76 | HR 78 | Temp 98.0°F | Resp 18 | Ht 68.0 in | Wt 223.3 lb

## 2022-08-26 DIAGNOSIS — Z5111 Encounter for antineoplastic chemotherapy: Secondary | ICD-10-CM | POA: Diagnosis not present

## 2022-08-26 DIAGNOSIS — C250 Malignant neoplasm of head of pancreas: Secondary | ICD-10-CM | POA: Diagnosis not present

## 2022-08-26 DIAGNOSIS — C259 Malignant neoplasm of pancreas, unspecified: Secondary | ICD-10-CM | POA: Diagnosis not present

## 2022-08-26 DIAGNOSIS — Z5189 Encounter for other specified aftercare: Secondary | ICD-10-CM | POA: Insufficient documentation

## 2022-08-26 DIAGNOSIS — C787 Secondary malignant neoplasm of liver and intrahepatic bile duct: Secondary | ICD-10-CM | POA: Diagnosis not present

## 2022-08-26 LAB — CMP (CANCER CENTER ONLY)
ALT: 37 U/L (ref 0–44)
AST: 44 U/L — ABNORMAL HIGH (ref 15–41)
Albumin: 3.9 g/dL (ref 3.5–5.0)
Alkaline Phosphatase: 159 U/L — ABNORMAL HIGH (ref 38–126)
Anion gap: 6 (ref 5–15)
BUN: 12 mg/dL (ref 8–23)
CO2: 27 mmol/L (ref 22–32)
Calcium: 8.9 mg/dL (ref 8.9–10.3)
Chloride: 107 mmol/L (ref 98–111)
Creatinine: 1 mg/dL (ref 0.61–1.24)
GFR, Estimated: >60 mL/min (ref >60)
Glucose, Bld: 146 mg/dL — ABNORMAL HIGH (ref 70–99)
Potassium: 4.2 mmol/L (ref 3.5–5.1)
Sodium: 140 mmol/L (ref 135–145)
Total Bilirubin: 0.4 mg/dL (ref 0.3–1.2)
Total Protein: 6.6 g/dL (ref 6.5–8.1)

## 2022-08-26 LAB — CBC WITH DIFFERENTIAL (CANCER CENTER ONLY)
Abs Immature Granulocytes: 0.93 10*3/uL — ABNORMAL HIGH (ref 0.00–0.07)
Basophils Absolute: 0.1 10*3/uL (ref 0.0–0.1)
Basophils Relative: 1 %
Eosinophils Absolute: 0.1 10*3/uL (ref 0.0–0.5)
Eosinophils Relative: 1 %
HCT: 35.1 % — ABNORMAL LOW (ref 39.0–52.0)
Hemoglobin: 11.7 g/dL — ABNORMAL LOW (ref 13.0–17.0)
Immature Granulocytes: 10 %
Lymphocytes Relative: 16 %
Lymphs Abs: 1.6 10*3/uL (ref 0.7–4.0)
MCH: 29.3 pg (ref 26.0–34.0)
MCHC: 33.3 g/dL (ref 30.0–36.0)
MCV: 88 fL (ref 80.0–100.0)
Monocytes Absolute: 1 10*3/uL (ref 0.1–1.0)
Monocytes Relative: 10 %
Neutro Abs: 6 10*3/uL (ref 1.7–7.7)
Neutrophils Relative %: 62 %
Platelet Count: 129 10*3/uL — ABNORMAL LOW (ref 150–400)
RBC: 3.99 MIL/uL — ABNORMAL LOW (ref 4.22–5.81)
RDW: 17.5 % — ABNORMAL HIGH (ref 11.5–15.5)
WBC Count: 9.7 10*3/uL (ref 4.0–10.5)
nRBC: 0.2 % (ref 0.0–0.2)

## 2022-08-26 MED ORDER — SODIUM CHLORIDE 0.9 % IV SOLN
150.0000 mg | Freq: Once | INTRAVENOUS | Status: AC
  Administered 2022-08-26: 150 mg via INTRAVENOUS
  Filled 2022-08-26: qty 150

## 2022-08-26 MED ORDER — OXALIPLATIN CHEMO INJECTION 100 MG/20ML
70.0000 mg/m2 | Freq: Once | INTRAVENOUS | Status: AC
  Administered 2022-08-26: 150 mg via INTRAVENOUS
  Filled 2022-08-26: qty 20

## 2022-08-26 MED ORDER — SODIUM CHLORIDE 0.9 % IV SOLN
130.0000 mg/m2 | Freq: Once | INTRAVENOUS | Status: AC
  Administered 2022-08-26: 280 mg via INTRAVENOUS
  Filled 2022-08-26: qty 14

## 2022-08-26 MED ORDER — ATROPINE SULFATE 1 MG/ML IV SOLN
0.5000 mg | Freq: Once | INTRAVENOUS | Status: AC | PRN
  Administered 2022-08-26: 0.5 mg via INTRAVENOUS
  Filled 2022-08-26: qty 1

## 2022-08-26 MED ORDER — SODIUM CHLORIDE 0.9 % IV SOLN
400.0000 mg/m2 | Freq: Once | INTRAVENOUS | Status: AC
  Administered 2022-08-26: 868 mg via INTRAVENOUS
  Filled 2022-08-26: qty 43.4

## 2022-08-26 MED ORDER — SODIUM CHLORIDE 0.9 % IV SOLN
10.0000 mg | Freq: Once | INTRAVENOUS | Status: AC
  Administered 2022-08-26: 10 mg via INTRAVENOUS
  Filled 2022-08-26: qty 10

## 2022-08-26 MED ORDER — PALONOSETRON HCL INJECTION 0.25 MG/5ML
0.2500 mg | Freq: Once | INTRAVENOUS | Status: AC
  Administered 2022-08-26: 0.25 mg via INTRAVENOUS
  Filled 2022-08-26: qty 5

## 2022-08-26 MED ORDER — DEXTROSE 5 % IV SOLN: Freq: Once | INTRAVENOUS | Status: AC

## 2022-08-26 MED ORDER — SODIUM CHLORIDE 0.9 % IV SOLN
5000.0000 mg | INTRAVENOUS | Status: DC
  Administered 2022-08-26: 5000 mg via INTRAVENOUS
  Filled 2022-08-26: qty 100

## 2022-08-26 NOTE — Patient Instructions (Signed)
[facility]   Discharge Instructions: Thank you for choosing [facility] to provide your oncology and hematology care.   If you have a lab appointment with the [facility], please go directly to the [facility] and check in at the registration area.   Wear comfortable clothing and clothing appropriate for easy access to any Portacath or PICC line.   We strive to give you quality time with your provider. You may need to reschedule your appointment if you arrive late (15 or more minutes).  Arriving late affects you and other patients whose appointments are after yours.  Also, if you miss three or more appointments without notifying the office, you may be dismissed from the clinic at the provider's discretion.      For prescription refill requests, have your pharmacy contact our office and allow 72 hours for refills to be completed.    Today you received the following chemotherapy and/or immunotherapy agents: oxaliplatin, irinotecan, fluorouracil, leucovorin      To help prevent nausea and vomiting after your treatment, we encourage you to take your nausea medication as directed.  BELOW ARE SYMPTOMS THAT SHOULD BE REPORTED IMMEDIATELY: *FEVER GREATER THAN 100.4 F (38 C) OR HIGHER *CHILLS OR SWEATING *NAUSEA AND VOMITING THAT IS NOT CONTROLLED WITH YOUR NAUSEA MEDICATION *UNUSUAL SHORTNESS OF BREATH *UNUSUAL BRUISING OR BLEEDING *URINARY PROBLEMS (pain or burning when urinating, or frequent urination) *BOWEL PROBLEMS (unusual diarrhea, constipation, pain near the anus) TENDERNESS IN MOUTH AND THROAT WITH OR WITHOUT PRESENCE OF ULCERS (sore throat, sores in mouth, or a toothache) UNUSUAL RASH, SWELLING OR PAIN  UNUSUAL VAGINAL DISCHARGE OR ITCHING   Items with * indicate a potential emergency and should be followed up as soon as possible or go to the Emergency Department if any problems should occur.  Please show the CHEMOTHERAPY ALERT CARD  or IMMUNOTHERAPY ALERT CARD at check-in to the Emergency Department and triage nurse.  Should you have questions after your visit or need to cancel or reschedule your appointment, please contact [facility]  Dept: [phone]  and follow the prompts.  Office hours are 8:00 a.m. to 4:30 p.m. Monday - Friday. Please note that voicemails left after 4:00 p.m. may not be returned until the following business day.  We are closed weekends and major holidays. You have access to a nurse at all times for urgent questions. Please call the main number to the clinic Dept: [phone] and follow the prompts.   For any non-urgent questions, you may also contact your provider using MyChart. We now offer e-Visits for anyone 57 and older to request care online for non-urgent symptoms. For details visit [email or web address].   Also download the MyChart app! Go to the app store, search "MyChart", open the app, select [facility], and log in with your MyChart username and password.  Masks are optional in the cancer centers. If you would like for your care team to wear a mask while they are taking care of you, please let them know. You may have one support person who is at least 78 years old accompany you for your appointments.
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
## 2022-08-27 ENCOUNTER — Other Ambulatory Visit: Payer: Self-pay

## 2022-08-28 ENCOUNTER — Inpatient Hospital Stay: Payer: Medicare Other

## 2022-08-28 VITALS — BP 156/73 | HR 89 | Temp 97.9°F | Resp 18

## 2022-08-28 DIAGNOSIS — Z5111 Encounter for antineoplastic chemotherapy: Secondary | ICD-10-CM | POA: Diagnosis not present

## 2022-08-28 DIAGNOSIS — C787 Secondary malignant neoplasm of liver and intrahepatic bile duct: Secondary | ICD-10-CM | POA: Diagnosis not present

## 2022-08-28 DIAGNOSIS — C250 Malignant neoplasm of head of pancreas: Secondary | ICD-10-CM | POA: Diagnosis not present

## 2022-08-28 DIAGNOSIS — Z5189 Encounter for other specified aftercare: Secondary | ICD-10-CM | POA: Diagnosis not present

## 2022-08-28 MED ORDER — PEGFILGRASTIM INJECTION 6 MG/0.6ML ~~LOC~~
6.0000 mg | PREFILLED_SYRINGE | Freq: Once | SUBCUTANEOUS | Status: AC
  Administered 2022-08-28: 6 mg via SUBCUTANEOUS

## 2022-08-28 MED ORDER — HEPARIN SOD (PORK) LOCK FLUSH 100 UNIT/ML IV SOLN
500.0000 [IU] | Freq: Once | INTRAVENOUS | Status: AC | PRN
  Administered 2022-08-28: 500 [IU]

## 2022-08-28 MED ORDER — SODIUM CHLORIDE 0.9% FLUSH
10.0000 mL | INTRAVENOUS | Status: DC | PRN
  Administered 2022-08-28: 10 mL

## 2022-09-02 ENCOUNTER — Ambulatory Visit (HOSPITAL_COMMUNITY)
Admission: RE | Admit: 2022-09-02 | Discharge: 2022-09-02 | Disposition: A | Discharge status: Home / Self Care | Payer: Medicare Other | Source: Ambulatory Visit | Attending: Hematology | Admitting: Hematology

## 2022-09-02 DIAGNOSIS — K760 Fatty (change of) liver, not elsewhere classified: Secondary | ICD-10-CM | POA: Diagnosis not present

## 2022-09-02 DIAGNOSIS — C259 Malignant neoplasm of pancreas, unspecified: Secondary | ICD-10-CM | POA: Diagnosis not present

## 2022-09-02 DIAGNOSIS — K5730 Diverticulosis of large intestine without perforation or abscess without bleeding: Secondary | ICD-10-CM | POA: Diagnosis not present

## 2022-09-02 DIAGNOSIS — K838 Other specified diseases of biliary tract: Secondary | ICD-10-CM | POA: Diagnosis not present

## 2022-09-02 DIAGNOSIS — I700 Atherosclerosis of aorta: Secondary | ICD-10-CM | POA: Diagnosis not present

## 2022-09-02 MED ORDER — SODIUM CHLORIDE (PF) 0.9 % IJ SOLN
INTRAMUSCULAR | Status: AC
  Filled 2022-09-02: qty 50

## 2022-09-02 MED ORDER — HEPARIN SOD (PORK) LOCK FLUSH 100 UNIT/ML IV SOLN
INTRAVENOUS | Status: AC
  Administered 2022-09-02: 500 [IU]
  Filled 2022-09-02: qty 5

## 2022-09-02 MED ORDER — IOHEXOL 300 MG/ML  SOLN
100.0000 mL | Freq: Once | INTRAMUSCULAR | Status: AC | PRN
  Administered 2022-09-02: 100 mL via INTRAVENOUS

## 2022-09-08 ENCOUNTER — Other Ambulatory Visit: Payer: Self-pay

## 2022-09-08 DIAGNOSIS — C259 Malignant neoplasm of pancreas, unspecified: Secondary | ICD-10-CM | POA: Diagnosis not present

## 2022-09-08 MED FILL — Dexamethasone Sodium Phosphate Inj 100 MG/10ML: INTRAMUSCULAR | Qty: 1 | Status: AC

## 2022-09-08 MED FILL — Fosaprepitant Dimeglumine For IV Infusion 150 MG (Base Eq): INTRAVENOUS | Qty: 5 | Status: AC

## 2022-09-08 NOTE — Progress Notes (Signed)
The proposed treatment discussed in conference is for discussion purpose only and is not a binding recommendation.  The patients have not been physically examined, or presented with their treatment options.  Therefore, final treatment plans cannot be decided.  

## 2022-09-09 ENCOUNTER — Inpatient Hospital Stay: Payer: Medicare Other | Admitting: Hematology

## 2022-09-09 ENCOUNTER — Inpatient Hospital Stay: Payer: Medicare Other

## 2022-09-09 ENCOUNTER — Encounter: Payer: Self-pay | Admitting: Hematology

## 2022-09-09 VITALS — BP 142/78 | HR 68 | Temp 97.9°F | Resp 18 | Wt 222.8 lb

## 2022-09-09 DIAGNOSIS — C259 Malignant neoplasm of pancreas, unspecified: Secondary | ICD-10-CM

## 2022-09-09 DIAGNOSIS — Z5189 Encounter for other specified aftercare: Secondary | ICD-10-CM | POA: Diagnosis not present

## 2022-09-09 DIAGNOSIS — Z5111 Encounter for antineoplastic chemotherapy: Secondary | ICD-10-CM | POA: Diagnosis not present

## 2022-09-09 DIAGNOSIS — C250 Malignant neoplasm of head of pancreas: Secondary | ICD-10-CM | POA: Diagnosis not present

## 2022-09-09 DIAGNOSIS — C787 Secondary malignant neoplasm of liver and intrahepatic bile duct: Secondary | ICD-10-CM | POA: Diagnosis not present

## 2022-09-09 DIAGNOSIS — Z95828 Presence of other vascular implants and grafts: Secondary | ICD-10-CM

## 2022-09-09 LAB — CBC WITH DIFFERENTIAL (CANCER CENTER ONLY)
Abs Immature Granulocytes: 1 10*3/uL — ABNORMAL HIGH (ref 0.00–0.07)
Basophils Absolute: 0.1 10*3/uL (ref 0.0–0.1)
Basophils Relative: 2 %
Eosinophils Absolute: 0 10*3/uL (ref 0.0–0.5)
Eosinophils Relative: 0 %
HCT: 33.2 % — ABNORMAL LOW (ref 39.0–52.0)
Hemoglobin: 11.3 g/dL — ABNORMAL LOW (ref 13.0–17.0)
Immature Granulocytes: 11 %
Lymphocytes Relative: 16 %
Lymphs Abs: 1.5 10*3/uL (ref 0.7–4.0)
MCH: 29.9 pg (ref 26.0–34.0)
MCHC: 34 g/dL (ref 30.0–36.0)
MCV: 87.8 fL (ref 80.0–100.0)
Monocytes Absolute: 0.9 10*3/uL (ref 0.1–1.0)
Monocytes Relative: 10 %
Neutro Abs: 5.6 10*3/uL (ref 1.7–7.7)
Neutrophils Relative %: 61 %
Platelet Count: 152 10*3/uL (ref 150–400)
RBC: 3.78 MIL/uL — ABNORMAL LOW (ref 4.22–5.81)
RDW: 16.6 % — ABNORMAL HIGH (ref 11.5–15.5)
WBC Count: 9.2 10*3/uL (ref 4.0–10.5)
nRBC: 0.2 % (ref 0.0–0.2)

## 2022-09-09 LAB — CMP (CANCER CENTER ONLY)
ALT: 55 U/L — ABNORMAL HIGH (ref 0–44)
AST: 39 U/L (ref 15–41)
Albumin: 3.8 g/dL (ref 3.5–5.0)
Alkaline Phosphatase: 236 U/L — ABNORMAL HIGH (ref 38–126)
Anion gap: 5 (ref 5–15)
BUN: 15 mg/dL (ref 8–23)
CO2: 28 mmol/L (ref 22–32)
Calcium: 8.7 mg/dL — ABNORMAL LOW (ref 8.9–10.3)
Chloride: 107 mmol/L (ref 98–111)
Creatinine: 0.99 mg/dL (ref 0.61–1.24)
GFR, Estimated: >60 mL/min (ref >60)
Glucose, Bld: 123 mg/dL — ABNORMAL HIGH (ref 70–99)
Potassium: 4.3 mmol/L (ref 3.5–5.1)
Sodium: 140 mmol/L (ref 135–145)
Total Bilirubin: 0.3 mg/dL (ref 0.3–1.2)
Total Protein: 6.5 g/dL (ref 6.5–8.1)

## 2022-09-09 MED ORDER — SODIUM CHLORIDE 0.9 % IV SOLN
10.0000 mg | Freq: Once | INTRAVENOUS | Status: AC
  Administered 2022-09-09: 10 mg via INTRAVENOUS
  Filled 2022-09-09: qty 10

## 2022-09-09 MED ORDER — SODIUM CHLORIDE 0.9 % IV SOLN
130.0000 mg/m2 | Freq: Once | INTRAVENOUS | Status: AC
  Administered 2022-09-09: 280 mg via INTRAVENOUS
  Filled 2022-09-09: qty 14

## 2022-09-09 MED ORDER — ATROPINE SULFATE 1 MG/ML IV SOLN
0.5000 mg | Freq: Once | INTRAVENOUS | Status: AC | PRN
  Administered 2022-09-09: 0.5 mg via INTRAVENOUS
  Filled 2022-09-09: qty 1

## 2022-09-09 MED ORDER — SODIUM CHLORIDE 0.9 % IV SOLN
150.0000 mg | Freq: Once | INTRAVENOUS | Status: AC
  Administered 2022-09-09: 150 mg via INTRAVENOUS
  Filled 2022-09-09: qty 150

## 2022-09-09 MED ORDER — SODIUM CHLORIDE 0.9% FLUSH
10.0000 mL | Freq: Once | INTRAVENOUS | Status: AC
  Administered 2022-09-09: 10 mL

## 2022-09-09 MED ORDER — DEXTROSE 5 % IV SOLN: Freq: Once | INTRAVENOUS | Status: AC

## 2022-09-09 MED ORDER — SODIUM CHLORIDE 0.9 % IV SOLN
5000.0000 mg | INTRAVENOUS | Status: DC
  Administered 2022-09-09: 5000 mg via INTRAVENOUS
  Filled 2022-09-09: qty 100

## 2022-09-09 MED ORDER — OXALIPLATIN CHEMO INJECTION 100 MG/20ML
70.0000 mg/m2 | Freq: Once | INTRAVENOUS | Status: AC
  Administered 2022-09-09: 150 mg via INTRAVENOUS
  Filled 2022-09-09: qty 20

## 2022-09-09 MED ORDER — SODIUM CHLORIDE 0.9 % IV SOLN
400.0000 mg/m2 | Freq: Once | INTRAVENOUS | Status: AC
  Administered 2022-09-09: 868 mg via INTRAVENOUS
  Filled 2022-09-09: qty 43.4

## 2022-09-09 MED ORDER — PALONOSETRON HCL INJECTION 0.25 MG/5ML
0.2500 mg | Freq: Once | INTRAVENOUS | Status: AC
  Administered 2022-09-09: 0.25 mg via INTRAVENOUS
  Filled 2022-09-09: qty 5
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
## 2022-09-09 NOTE — Progress Notes (Signed)
When pt was almost finished with Irinotecan infusion, he began complaining of severe abdominal pain. Infusion paused. Burr Medico MD notified. Atropine 0.'5mg'$  administered. Pt reported resolution of symptoms. Infusion re-started and completed without incident.   Per Burr Medico MD, ok to speed pump up to 44 hours to accommodate Saturday schedule.

## 2022-09-09 NOTE — Patient Instructions (Signed)
[facility]  Discharge Instructions: Thank you for choosing [facility] to provide your oncology and hematology care.   If you have a lab appointment with the [facility], please go directly to the [facility] and check in at the registration area.   Wear comfortable clothing and clothing appropriate for easy access to any Portacath or PICC line.   We strive to give you quality time with your provider. You may need to reschedule your appointment if you arrive late (15 or more minutes).  Arriving late affects you and other patients whose appointments are after yours.  Also, if you miss three or more appointments without notifying the office, you may be dismissed from the clinic at the provider's discretion.      For prescription refill requests, have your pharmacy contact our office and allow 72 hours for refills to be completed.    Today you received the following chemotherapy and/or immunotherapy agents: Oxaliplatin, Leucovorin, Irinotecan, Fluorouracil.       To help prevent nausea and vomiting after your treatment, we encourage you to take your nausea medication as directed.  BELOW ARE SYMPTOMS THAT SHOULD BE REPORTED IMMEDIATELY: *FEVER GREATER THAN 100.4 F (38 C) OR HIGHER *CHILLS OR SWEATING *NAUSEA AND VOMITING THAT IS NOT CONTROLLED WITH YOUR NAUSEA MEDICATION *UNUSUAL SHORTNESS OF BREATH *UNUSUAL BRUISING OR BLEEDING *URINARY PROBLEMS (pain or burning when urinating, or frequent urination) *BOWEL PROBLEMS (unusual diarrhea, constipation, pain near the anus) TENDERNESS IN MOUTH AND THROAT WITH OR WITHOUT PRESENCE OF ULCERS (sore throat, sores in mouth, or a toothache) UNUSUAL RASH, SWELLING OR PAIN  UNUSUAL VAGINAL DISCHARGE OR ITCHING   Items with * indicate a potential emergency and should be followed up as soon as possible or go to the Emergency Department if any problems should occur.  Please show the CHEMOTHERAPY ALERT CARD  or IMMUNOTHERAPY ALERT CARD at check-in to the Emergency Department and triage nurse.  Should you have questions after your visit or need to cancel or reschedule your appointment, please contact [facility]  Dept: [phone]  and follow the prompts.  Office hours are 8:00 a.m. to 4:30 p.m. Monday - Friday. Please note that voicemails left after 4:00 p.m. may not be returned until the following business day.  We are closed weekends and major holidays. You have access to a nurse at all times for urgent questions. Please call the main number to the clinic Dept: [phone] and follow the prompts.   For any non-urgent questions, you may also contact your provider using MyChart. We now offer e-Visits for anyone 67 and older to request care online for non-urgent symptoms. For details visit [email or web address].   Also download the MyChart app! Go to the app store, search "MyChart", open the app, select [facility], and log in with your MyChart username and password.  Masks are optional in the cancer centers. If you would like for your care team to wear a mask while they are taking care of you, please let them know. You may have one support person who is at least 78 years old accompany you for your appointments.

## 2022-09-11 ENCOUNTER — Inpatient Hospital Stay: Payer: Medicare Other

## 2022-09-11 VITALS — BP 130/55 | HR 95 | Temp 97.9°F | Resp 18 | Ht 68.0 in | Wt 222.0 lb

## 2022-09-11 DIAGNOSIS — Z5189 Encounter for other specified aftercare: Secondary | ICD-10-CM | POA: Diagnosis not present

## 2022-09-11 DIAGNOSIS — C259 Malignant neoplasm of pancreas, unspecified: Secondary | ICD-10-CM

## 2022-09-11 DIAGNOSIS — C250 Malignant neoplasm of head of pancreas: Secondary | ICD-10-CM | POA: Diagnosis not present

## 2022-09-11 DIAGNOSIS — C787 Secondary malignant neoplasm of liver and intrahepatic bile duct: Secondary | ICD-10-CM | POA: Diagnosis not present

## 2022-09-11 DIAGNOSIS — Z5111 Encounter for antineoplastic chemotherapy: Secondary | ICD-10-CM | POA: Diagnosis not present

## 2022-09-11 MED ORDER — SODIUM CHLORIDE 0.9% FLUSH
10.0000 mL | INTRAVENOUS | Status: DC | PRN
  Administered 2022-09-11: 10 mL

## 2022-09-11 MED ORDER — PEGFILGRASTIM INJECTION 6 MG/0.6ML ~~LOC~~
6.0000 mg | PREFILLED_SYRINGE | Freq: Once | SUBCUTANEOUS | Status: AC
  Administered 2022-09-11: 6 mg via SUBCUTANEOUS
  Filled 2022-09-11: qty 0.6

## 2022-09-11 MED ORDER — HEPARIN SOD (PORK) LOCK FLUSH 100 UNIT/ML IV SOLN
500.0000 [IU] | Freq: Once | INTRAVENOUS | Status: AC | PRN
  Administered 2022-09-11: 500 [IU]

## 2022-09-11 NOTE — Patient Instructions (Signed)

## 2022-09-14 ENCOUNTER — Other Ambulatory Visit: Payer: Self-pay | Admitting: Hematology

## 2022-09-14 DIAGNOSIS — C259 Malignant neoplasm of pancreas, unspecified: Secondary | ICD-10-CM

## 2022-09-14 NOTE — Progress Notes (Signed)
DISCONTINUE ON PATHWAY REGIMEN - Pancreatic Adenocarcinoma     A cycle is every 14 days:     Irinotecan      Oxaliplatin      Leucovorin      Fluorouracil   **Always confirm dose/schedule in your pharmacy ordering system**  REASON: Other Reason PRIOR TREATMENT: PANOS95: mFOLFIRINOX q14 Days x 4 Cycles TREATMENT RESPONSE: Stable Disease (SD)  START ON PATHWAY REGIMEN - Pancreatic Adenocarcinoma     A cycle is every 14 days:     Liposomal irinotecan      Leucovorin      Fluorouracil   **Always confirm dose/schedule in your pharmacy ordering system**  Patient Characteristics: Locally Advanced, Anatomically Unresectable, M0, Second Line, MSS/pMMR or MSI Unknown, Gemcitabine-Based Therapy First Line Therapeutic Status: Locally Advanced, Anatomically Unresectable, M0 Line of Therapy: Second Line Microsatellite/Mismatch Repair Status: MSS/pMMR Intent of Therapy: Non-Curative / Palliative Intent, Discussed with Patient

## 2022-09-16 NOTE — Progress Notes (Signed)
Pharmacist Chemotherapy Monitoring - Initial Assessment    Anticipated start date: 09/23/22   The following has been reviewed per standard work regarding the patient's treatment regimen: The patient's diagnosis, treatment plan and drug doses, and organ/hematologic function Lab orders and baseline tests specific to treatment regimen  The treatment plan start date, drug sequencing, and pre-medications Prior authorization status  Patient's documented medication list, including drug-drug interaction screen and prescriptions for anti-emetics and supportive care specific to the treatment regimen The drug concentrations, fluid compatibility, administration routes, and timing of the medications to be used The patient's access for treatment and lifetime cumulative dose history, if applicable  The patient's medication allergies and previous infusion related reactions, if applicable   Changes made to treatment plan:  N/A  Follow up needed:  N/A   Larene Beach, RPH, 09/16/2022  3:52 PM

## 2022-09-21 MED FILL — Dexamethasone Sodium Phosphate Inj 100 MG/10ML: INTRAMUSCULAR | Qty: 1 | Status: AC

## 2022-09-22 ENCOUNTER — Inpatient Hospital Stay (HOSPITAL_BASED_OUTPATIENT_CLINIC_OR_DEPARTMENT_OTHER): Payer: Medicare Other | Admitting: Hematology

## 2022-09-22 ENCOUNTER — Encounter: Payer: Self-pay | Admitting: Hematology

## 2022-09-22 ENCOUNTER — Inpatient Hospital Stay: Payer: Medicare Other

## 2022-09-22 ENCOUNTER — Inpatient Hospital Stay: Payer: Medicare Other | Attending: Hematology

## 2022-09-22 VITALS — BP 159/78 | HR 65 | Temp 97.9°F | Resp 18 | Ht 68.0 in | Wt 211.8 lb

## 2022-09-22 DIAGNOSIS — C250 Malignant neoplasm of head of pancreas: Secondary | ICD-10-CM | POA: Insufficient documentation

## 2022-09-22 DIAGNOSIS — C259 Malignant neoplasm of pancreas, unspecified: Secondary | ICD-10-CM

## 2022-09-22 DIAGNOSIS — Z5111 Encounter for antineoplastic chemotherapy: Secondary | ICD-10-CM | POA: Diagnosis not present

## 2022-09-22 DIAGNOSIS — Z95828 Presence of other vascular implants and grafts: Secondary | ICD-10-CM

## 2022-09-22 LAB — CMP (CANCER CENTER ONLY)
ALT: 93 U/L — ABNORMAL HIGH (ref 0–44)
AST: 99 U/L — ABNORMAL HIGH (ref 15–41)
Albumin: 3.7 g/dL (ref 3.5–5.0)
Alkaline Phosphatase: 251 U/L — ABNORMAL HIGH (ref 38–126)
Anion gap: 7 (ref 5–15)
BUN: 9 mg/dL (ref 8–23)
CO2: 27 mmol/L (ref 22–32)
Calcium: 8.8 mg/dL — ABNORMAL LOW (ref 8.9–10.3)
Chloride: 107 mmol/L (ref 98–111)
Creatinine: 1.04 mg/dL (ref 0.61–1.24)
GFR, Estimated: >60 mL/min (ref >60)
Glucose, Bld: 125 mg/dL — ABNORMAL HIGH (ref 70–99)
Potassium: 4 mmol/L (ref 3.5–5.1)
Sodium: 141 mmol/L (ref 135–145)
Total Bilirubin: 0.4 mg/dL (ref 0.3–1.2)
Total Protein: 6.3 g/dL — ABNORMAL LOW (ref 6.5–8.1)

## 2022-09-22 LAB — CBC WITH DIFFERENTIAL (CANCER CENTER ONLY)
Abs Immature Granulocytes: 1.38 10*3/uL — ABNORMAL HIGH (ref 0.00–0.07)
Basophils Absolute: 0 10*3/uL (ref 0.0–0.1)
Basophils Relative: 0 %
Eosinophils Absolute: 0 10*3/uL (ref 0.0–0.5)
Eosinophils Relative: 0 %
HCT: 33.4 % — ABNORMAL LOW (ref 39.0–52.0)
Hemoglobin: 11.2 g/dL — ABNORMAL LOW (ref 13.0–17.0)
Immature Granulocytes: 14 %
Lymphocytes Relative: 12 %
Lymphs Abs: 1.1 10*3/uL (ref 0.7–4.0)
MCH: 30.1 pg (ref 26.0–34.0)
MCHC: 33.5 g/dL (ref 30.0–36.0)
MCV: 89.8 fL (ref 80.0–100.0)
Monocytes Absolute: 1.3 10*3/uL — ABNORMAL HIGH (ref 0.1–1.0)
Monocytes Relative: 13 %
Neutro Abs: 5.9 10*3/uL (ref 1.7–7.7)
Neutrophils Relative %: 61 %
Platelet Count: 107 10*3/uL — ABNORMAL LOW (ref 150–400)
RBC: 3.72 MIL/uL — ABNORMAL LOW (ref 4.22–5.81)
RDW: 17.2 % — ABNORMAL HIGH (ref 11.5–15.5)
WBC Count: 9.7 10*3/uL (ref 4.0–10.5)
nRBC: 0.4 % — ABNORMAL HIGH (ref 0.0–0.2)

## 2022-09-22 MED ORDER — SODIUM CHLORIDE 0.9 % IV SOLN: Freq: Once | INTRAVENOUS | Status: AC

## 2022-09-22 MED ORDER — SODIUM CHLORIDE 0.9 % IV SOLN
400.0000 mg/m2 | Freq: Once | INTRAVENOUS | Status: AC
  Administered 2022-09-22: 880 mg via INTRAVENOUS
  Filled 2022-09-22: qty 44

## 2022-09-22 MED ORDER — SODIUM CHLORIDE 0.9 % IV SOLN
10.0000 mg | Freq: Once | INTRAVENOUS | Status: AC
  Administered 2022-09-22: 10 mg via INTRAVENOUS
  Filled 2022-09-22: qty 10

## 2022-09-22 MED ORDER — SODIUM CHLORIDE 0.9 % IV SOLN
70.0000 mg/m2 | Freq: Once | INTRAVENOUS | Status: AC
  Administered 2022-09-22: 154.8 mg via INTRAVENOUS
  Filled 2022-09-22: qty 36

## 2022-09-22 MED ORDER — SODIUM CHLORIDE 0.9 % IV SOLN
5000.0000 mg | INTRAVENOUS | Status: DC
  Administered 2022-09-22: 5000 mg via INTRAVENOUS
  Filled 2022-09-22: qty 100

## 2022-09-22 MED ORDER — SODIUM CHLORIDE 0.9% FLUSH
10.0000 mL | Freq: Once | INTRAVENOUS | Status: AC
  Administered 2022-09-22: 10 mL

## 2022-09-22 MED ORDER — PALONOSETRON HCL INJECTION 0.25 MG/5ML
0.2500 mg | Freq: Once | INTRAVENOUS | Status: AC
  Administered 2022-09-22: 0.25 mg via INTRAVENOUS
  Filled 2022-09-22: qty 5

## 2022-09-22 NOTE — Patient Instructions (Signed)
[facility]  Discharge Instructions: Thank you for choosing [facility] to provide your oncology and hematology care.   If you have a lab appointment with the [facility], please go directly to the [facility] and check in at the registration area.   Wear comfortable clothing and clothing appropriate for easy access to any Portacath or PICC line.   We strive to give you quality time with your provider. You may need to reschedule your appointment if you arrive late (15 or more minutes).  Arriving late affects you and other patients whose appointments are after yours.  Also, if you miss three or more appointments without notifying the office, you may be dismissed from the clinic at the provider's discretion.      For prescription refill requests, have your pharmacy contact our office and allow 72 hours for refills to be completed.    Today you received the following chemotherapy and/or immunotherapy agents Leucovorin, Irinotecan Liposome, 5FU      To help prevent nausea and vomiting after your treatment, we encourage you to take your nausea medication as directed.  BELOW ARE SYMPTOMS THAT SHOULD BE REPORTED IMMEDIATELY: *FEVER GREATER THAN 100.4 F (38 C) OR HIGHER *CHILLS OR SWEATING *NAUSEA AND VOMITING THAT IS NOT CONTROLLED WITH YOUR NAUSEA MEDICATION *UNUSUAL SHORTNESS OF BREATH *UNUSUAL BRUISING OR BLEEDING *URINARY PROBLEMS (pain or burning when urinating, or frequent urination) *BOWEL PROBLEMS (unusual diarrhea, constipation, pain near the anus) TENDERNESS IN MOUTH AND THROAT WITH OR WITHOUT PRESENCE OF ULCERS (sore throat, sores in mouth, or a toothache) UNUSUAL RASH, SWELLING OR PAIN  UNUSUAL VAGINAL DISCHARGE OR ITCHING   Items with * indicate a potential emergency and should be followed up as soon as possible or go to the Emergency Department if any problems should occur.  Please show the CHEMOTHERAPY ALERT CARD or IMMUNOTHERAPY  ALERT CARD at check-in to the Emergency Department and triage nurse.  Should you have questions after your visit or need to cancel or reschedule your appointment, please contact [facility]  Dept: [phone]  and follow the prompts.  Office hours are 8:00 a.m. to 4:30 p.m. Monday - Friday. Please note that voicemails left after 4:00 p.m. may not be returned until the following business day.  We are closed weekends and major holidays. You have access to a nurse at all times for urgent questions. Please call the main number to the clinic Dept: [phone] and follow the prompts.   For any non-urgent questions, you may also contact your provider using MyChart. We now offer e-Visits for anyone 31 and older to request care online for non-urgent symptoms. For details visit [email or web address].   Also download the MyChart app! Go to the app store, search "MyChart", open the app, select [facility], and log in with your MyChart username and password.  Masks are optional in the cancer centers. If you would like for your care team to wear a mask while they are taking care of you, please let them know. You may have one support person who is at least 78 years old accompany you for your appointments.  Irinotecan Liposome Injection What is this medication? IRINOTECAN LIPOSOME (eye ri noe TEE kan LIP oh som) treats pancreatic cancer. It works by slowing down the growth of cancer cells. This medicine may be used for other purposes; ask your health care provider or pharmacist if you have questions. COMMON BRAND NAME(S): ONIVYDE What should I tell my  care team before I take this medication? They need to know if you have any of these conditions: Blockage in your bowels Dehydration Infection Low white blood cell levels Lung disease An unusual or allergic reaction to irinotecan liposome, irinotecan, other medications, foods, dyes, or preservatives Pregnant or trying to get  pregnant Breast-feeding How should I use this medication? This medication is injected into a vein. It is given by your care team in a hospital or clinic setting. Talk to your care team about the use of this medication in children. Special care may be needed. Overdosage: If you think you have taken too much of this medicine contact a poison control center or emergency room at once. NOTE: This medicine is only for you. Do not share this medicine with others. What if I miss a dose? Keep appointments for follow-up doses. It is important not to miss your dose. Call your care team if you are unable to keep an appointment. What may interact with this medication? Do not take this medication with any of the following: Itraconazole This medication may also interact with the following: Certain antivirals for HIV or AIDS Certain medications for seizures, such as carbamazepine, fosphenytoin, phenytoin, phenobarbital Clarithromycin Gemfibrozil Nefazodone Rifabutin Rifampin Rifapentine St. John's Wort Voriconazole This list may not describe all possible interactions. Give your health care provider a list of all the medicines, herbs, non-prescription drugs, or dietary supplements you use. Also tell them if you smoke, drink alcohol, or use illegal drugs. Some items may interact with your medicine. What should I watch for while using this medication? This medication may make you feel generally unwell. This is not uncommon as chemotherapy can affect healthy cells as well as cancer cells. Report any side effects. Continue your course of treatment even though you feel ill unless your care team tells you to stop. You may need blood work while you are taking this medication. This medication can cause serious side effects and allergic reactions. To reduce your risk, your care team may give you other medications to take before receiving this one. Be sure to follow the directions from your care team. Check with  your care team if you get an attack of diarrhea, nausea and vomiting, or if you sweat a lot. The loss of too much body fluid can make it dangerous for you to take this medication. This medication may cause infertility. Talk to your care team if you are concerned about your fertility. Talk to your care team if you wish to become pregnant or if you think you might be pregnant. This medication can cause serious birth defects if taken during pregnancy or if you get pregnant within 7 months after stopping therapy. A negative pregnancy test is required before starting this medication. A reliable form of contraception is recommended while taking this medication and for 7 months after stopping it. Talk to your care team about reliable forms of contraception. Use a condom during sex and for 4 months after stopping therapy. Tell your care team right away if you think your partner might be pregnant. This medication can cause serious birth defects. Do not breast-feed while taking this medication and for 1 month after stopping therapy. This medication may increase your risk of getting an infection. Call your care team for advice if you get a fever, chills, sore throat, or other symptoms of a cold or flu. Do not treat yourself. Try to avoid being around people who are sick. Avoid taking medications that contain aspirin, acetaminophen, ibuprofen,  naproxen, or ketoprofen unless instructed by your care team. These medications may hide a fever. Be careful brushing or flossing your teeth or using a toothpick because you may get an infection or bleed more easily. If you have any dental work done, tell your dentist you are receiving this medication. What side effects may I notice from receiving this medication? Side effects that you should report to your care team as soon as possible: Allergic reactions or angioedema--skin rash, itching or hives, swelling of the face, eyes, lips, tongue, arms, or legs, trouble swallowing or  breathing Dry cough, shortness of breath or trouble breathing Diarrhea Infection--fever, chills, cough, or sore throat Side effects that usually do not require medical attention (report to your care team if they continue or are bothersome): Fatigue Loss of appetite Nausea Pain, redness, or swelling with sores inside the mouth or throat Vomiting This list may not describe all possible side effects. Call your doctor for medical advice about side effects. You may report side effects to FDA at 1-800-FDA-1088. Where should I keep my medication? This medication is given in a hospital or clinic. It will not be stored at home. NOTE: This sheet is a summary. It may not cover all possible information. If you have questions about this medicine, talk to your doctor, pharmacist, or health care provider.  2023 Elsevier/Gold Standard (2021-12-31 00:00:00)  Leucovorin Injection What is this medication? LEUCOVORIN (loo koe VOR in) prevents side effects from certain medications, such as methotrexate. It works by increasing folate levels. This helps protect healthy cells in your body. It may also be used to treat anemia caused by low levels of folate. It can also be used with fluorouracil, a type of chemotherapy, to treat colorectal cancer. It works by increasing the effects of fluorouracil in the body. This medicine may be used for other purposes; ask your health care provider or pharmacist if you have questions. What should I tell my care team before I take this medication? They need to know if you have any of these conditions: Anemia from low levels of vitamin B12 in the blood An unusual or allergic reaction to leucovorin, folic acid, other medications, foods, dyes, or preservatives Pregnant or trying to get pregnant Breastfeeding How should I use this medication? This medication is injected into a vein or a muscle. It is given by your care team in a hospital or clinic setting. Talk to your care team  about the use of this medication in children. Special care may be needed. Overdosage: If you think you have taken too much of this medicine contact a poison control center or emergency room at once. NOTE: This medicine is only for you. Do not share this medicine with others. What if I miss a dose? Keep appointments for follow-up doses. It is important not to miss your dose. Call your care team if you are unable to keep an appointment. What may interact with this medication? Capecitabine Fluorouracil Phenobarbital Phenytoin Primidone Trimethoprim;sulfamethoxazole This list may not describe all possible interactions. Give your health care provider a list of all the medicines, herbs, non-prescription drugs, or dietary supplements you use. Also tell them if you smoke, drink alcohol, or use illegal drugs. Some items may interact with your medicine. What should I watch for while using this medication? Your condition will be monitored carefully while you are receiving this medication. This medication may increase the side effects of 5-fluorouracil. Tell your care team if you have diarrhea or mouth sores that do not  get better or that get worse. What side effects may I notice from receiving this medication? Side effects that you should report to your care team as soon as possible: Allergic reactions--skin rash, itching, hives, swelling of the face, lips, tongue, or throat This list may not describe all possible side effects. Call your doctor for medical advice about side effects. You may report side effects to FDA at 1-800-FDA-1088. Where should I keep my medication? This medication is given in a hospital or clinic. It will not be stored at home. NOTE: This sheet is a summary. It may not cover all possible information. If you have questions about this medicine, talk to your doctor, pharmacist, or health care provider.  2023 Elsevier/Gold Standard (2022-04-06 00:00:00) Fluorouracil Injection What is  this medication? FLUOROURACIL (flure oh YOOR a sil) treats some types of cancer. It works by slowing down the growth of cancer cells. This medicine may be used for other purposes; ask your health care provider or pharmacist if you have questions. COMMON BRAND NAME(S): Adrucil What should I tell my care team before I take this medication? They need to know if you have any of these conditions: Blood disorders Dihydropyrimidine dehydrogenase (DPD) deficiency Infection, such as chickenpox, cold sores, herpes Kidney disease Liver disease Poor nutrition Recent or ongoing radiation therapy An unusual or allergic reaction to fluorouracil, other medications, foods, dyes, or preservatives If you or your partner are pregnant or trying to get pregnant Breast-feeding How should I use this medication? This medication is injected into a vein. It is administered by your care team in a hospital or clinic setting. Talk to your care team about the use of this medication in children. Special care may be needed. Overdosage: If you think you have taken too much of this medicine contact a poison control center or emergency room at once. NOTE: This medicine is only for you. Do not share this medicine with others. What if I miss a dose? Keep appointments for follow-up doses. It is important not to miss your dose. Call your care team if you are unable to keep an appointment. What may interact with this medication? Do not take this medication with any of the following: Live virus vaccines This medication may also interact with the following: Medications that treat or prevent blood clots, such as warfarin, enoxaparin, dalteparin This list may not describe all possible interactions. Give your health care provider a list of all the medicines, herbs, non-prescription drugs, or dietary supplements you use. Also tell them if you smoke, drink alcohol, or use illegal drugs. Some items may interact with your medicine. What  should I watch for while using this medication? Your condition will be monitored carefully while you are receiving this medication. This medication may make you feel generally unwell. This is not uncommon as chemotherapy can affect healthy cells as well as cancer cells. Report any side effects. Continue your course of treatment even though you feel ill unless your care team tells you to stop. In some cases, you may be given additional medications to help with side effects. Follow all directions for their use. This medication may increase your risk of getting an infection. Call your care team for advice if you get a fever, chills, sore throat, or other symptoms of a cold or flu. Do not treat yourself. Try to avoid being around people who are sick. This medication may increase your risk to bruise or bleed. Call your care team if you notice any unusual bleeding. Be  careful brushing or flossing your teeth or using a toothpick because you may get an infection or bleed more easily. If you have any dental work done, tell your dentist you are receiving this medication. Avoid taking medications that contain aspirin, acetaminophen, ibuprofen, naproxen, or ketoprofen unless instructed by your care team. These medications may hide a fever. Do not treat diarrhea with over the counter products. Contact your care team if you have diarrhea that lasts more than 2 days or if it is severe and watery. This medication can make you more sensitive to the sun. Keep out of the sun. If you cannot avoid being in the sun, wear protective clothing and sunscreen. Do not use sun lamps, tanning beds, or tanning booths. Talk to your care team if you or your partner wish to become pregnant or think you might be pregnant. This medication can cause serious birth defects if taken during pregnancy and for 3 months after the last dose. A reliable form of contraception is recommended while taking this medication and for 3 months after the last  dose. Talk to your care team about effective forms of contraception. Do not father a child while taking this medication and for 3 months after the last dose. Use a condom while having sex during this time period. Do not breastfeed while taking this medication. This medication may cause infertility. Talk to your care team if you are concerned about your fertility. What side effects may I notice from receiving this medication? Side effects that you should report to your care team as soon as possible: Allergic reactions--skin rash, itching, hives, swelling of the face, lips, tongue, or throat Heart attack--pain or tightness in the chest, shoulders, arms, or jaw, nausea, shortness of breath, cold or clammy skin, feeling faint or lightheaded Heart failure--shortness of breath, swelling of the ankles, feet, or hands, sudden weight gain, unusual weakness or fatigue Heart rhythm changes--fast or irregular heartbeat, dizziness, feeling faint or lightheaded, chest pain, trouble breathing High ammonia level--unusual weakness or fatigue, confusion, loss of appetite, nausea, vomiting, seizures Infection--fever, chills, cough, sore throat, wounds that don't heal, pain or trouble when passing urine, general feeling of discomfort or being unwell Low red blood cell level--unusual weakness or fatigue, dizziness, headache, trouble breathing Pain, tingling, or numbness in the hands or feet, muscle weakness, change in vision, confusion or trouble speaking, loss of balance or coordination, trouble walking, seizures Redness, swelling, and blistering of the skin over hands and feet Severe or prolonged diarrhea Unusual bruising or bleeding Side effects that usually do not require medical attention (report to your care team if they continue or are bothersome): Dry skin Headache Increased tears Nausea Pain, redness, or swelling with sores inside the mouth or throat Sensitivity to light Vomiting This list may not  describe all possible side effects. Call your doctor for medical advice about side effects. You may report side effects to FDA at 1-800-FDA-1088. Where should I keep my medication? This medication is given in a hospital or clinic. It will not be stored at home. NOTE: This sheet is a summary. It may not cover all possible information. If you have questions about this medicine, talk to your doctor, pharmacist, or health care provider.  2023 Elsevier/Gold Standard (2022-03-02 00:00:00)
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
## 2022-09-22 NOTE — Progress Notes (Signed)
Ok to proceed with treatment today per Dr. Burr Medico with AST of 99 and ALT of 93.

## 2022-09-23 ENCOUNTER — Ambulatory Visit: Payer: Medicare Other

## 2022-09-23 ENCOUNTER — Ambulatory Visit: Payer: Medicare Other | Admitting: Hematology

## 2022-09-23 ENCOUNTER — Other Ambulatory Visit: Payer: Medicare Other

## 2022-09-24 ENCOUNTER — Inpatient Hospital Stay: Payer: Medicare Other

## 2022-09-24 VITALS — BP 147/79 | HR 80 | Temp 98.6°F | Resp 18

## 2022-09-24 DIAGNOSIS — C250 Malignant neoplasm of head of pancreas: Secondary | ICD-10-CM | POA: Diagnosis not present

## 2022-09-24 DIAGNOSIS — Z5111 Encounter for antineoplastic chemotherapy: Secondary | ICD-10-CM | POA: Diagnosis not present

## 2022-09-24 DIAGNOSIS — C259 Malignant neoplasm of pancreas, unspecified: Secondary | ICD-10-CM

## 2022-09-24 MED ORDER — SODIUM CHLORIDE 0.9% FLUSH
10.0000 mL | INTRAVENOUS | Status: DC | PRN
  Administered 2022-09-24: 10 mL

## 2022-09-24 MED ORDER — HEPARIN SOD (PORK) LOCK FLUSH 100 UNIT/ML IV SOLN
500.0000 [IU] | Freq: Once | INTRAVENOUS | Status: AC | PRN
  Administered 2022-09-24: 500 [IU]

## 2022-09-28 ENCOUNTER — Telehealth: Payer: Self-pay

## 2022-09-28 NOTE — Telephone Encounter (Signed)
Spoke with pt to inform pt that someone from [facility] will be contacting him regarding his surgery options and treatment plan.  Informed pt that Dr. Burr Medico has coordinated with Dr. Zenia Resides and his other [facility] Team members regarding his care.  Pt stated Dr. Zenia Resides was in contact with him as well and stated that the surgical team at [facility] would be contacting him.  Pt had no further questions or concerns at this time.

## 2022-10-09 DIAGNOSIS — C259 Malignant neoplasm of pancreas, unspecified: Secondary | ICD-10-CM | POA: Diagnosis not present

## 2022-10-11 DIAGNOSIS — C259 Malignant neoplasm of pancreas, unspecified: Secondary | ICD-10-CM | POA: Diagnosis not present

## 2022-10-12 MED FILL — Dexamethasone Sodium Phosphate Inj 100 MG/10ML: INTRAMUSCULAR | Qty: 1 | Status: AC

## 2022-10-13 ENCOUNTER — Inpatient Hospital Stay: Payer: Medicare Other

## 2022-10-13 ENCOUNTER — Encounter: Payer: Self-pay | Admitting: Nurse Practitioner

## 2022-10-13 ENCOUNTER — Inpatient Hospital Stay (HOSPITAL_BASED_OUTPATIENT_CLINIC_OR_DEPARTMENT_OTHER): Payer: Medicare Other | Admitting: Nurse Practitioner

## 2022-10-13 DIAGNOSIS — C250 Malignant neoplasm of head of pancreas: Secondary | ICD-10-CM | POA: Diagnosis not present

## 2022-10-13 DIAGNOSIS — C259 Malignant neoplasm of pancreas, unspecified: Secondary | ICD-10-CM

## 2022-10-13 DIAGNOSIS — Z95828 Presence of other vascular implants and grafts: Secondary | ICD-10-CM

## 2022-10-13 DIAGNOSIS — Z5111 Encounter for antineoplastic chemotherapy: Secondary | ICD-10-CM | POA: Diagnosis not present

## 2022-10-13 LAB — CBC WITH DIFFERENTIAL (CANCER CENTER ONLY)
Abs Immature Granulocytes: 0.01 10*3/uL (ref 0.00–0.07)
Basophils Absolute: 0 10*3/uL (ref 0.0–0.1)
Basophils Relative: 1 %
Eosinophils Absolute: 0.1 10*3/uL (ref 0.0–0.5)
Eosinophils Relative: 2 %
HCT: 34.9 % — ABNORMAL LOW (ref 39.0–52.0)
Hemoglobin: 11.9 g/dL — ABNORMAL LOW (ref 13.0–17.0)
Immature Granulocytes: 0 %
Lymphocytes Relative: 14 %
Lymphs Abs: 0.5 10*3/uL — ABNORMAL LOW (ref 0.7–4.0)
MCH: 30.3 pg (ref 26.0–34.0)
MCHC: 34.1 g/dL (ref 30.0–36.0)
MCV: 88.8 fL (ref 80.0–100.0)
Monocytes Absolute: 0.7 10*3/uL (ref 0.1–1.0)
Monocytes Relative: 19 %
Neutro Abs: 2.5 10*3/uL (ref 1.7–7.7)
Neutrophils Relative %: 64 %
Platelet Count: 153 10*3/uL (ref 150–400)
RBC: 3.93 MIL/uL — ABNORMAL LOW (ref 4.22–5.81)
RDW: 16.1 % — ABNORMAL HIGH (ref 11.5–15.5)
WBC Count: 3.8 10*3/uL — ABNORMAL LOW (ref 4.0–10.5)
nRBC: 0 % (ref 0.0–0.2)

## 2022-10-13 LAB — CMP (CANCER CENTER ONLY)
ALT: 163 U/L — ABNORMAL HIGH (ref 0–44)
AST: 145 U/L — ABNORMAL HIGH (ref 15–41)
Albumin: 4.1 g/dL (ref 3.5–5.0)
Alkaline Phosphatase: 303 U/L — ABNORMAL HIGH (ref 38–126)
Anion gap: 6 (ref 5–15)
BUN: 12 mg/dL (ref 8–23)
CO2: 26 mmol/L (ref 22–32)
Calcium: 9.4 mg/dL (ref 8.9–10.3)
Chloride: 106 mmol/L (ref 98–111)
Creatinine: 1.01 mg/dL (ref 0.61–1.24)
GFR, Estimated: >60 mL/min (ref >60)
Glucose, Bld: 119 mg/dL — ABNORMAL HIGH (ref 70–99)
Potassium: 4.3 mmol/L (ref 3.5–5.1)
Sodium: 138 mmol/L (ref 135–145)
Total Bilirubin: 0.6 mg/dL (ref 0.3–1.2)
Total Protein: 6.9 g/dL (ref 6.5–8.1)

## 2022-10-13 MED ORDER — SODIUM CHLORIDE 0.9 % IV SOLN
10.0000 mg | Freq: Once | INTRAVENOUS | Status: AC
  Administered 2022-10-13: 10 mg via INTRAVENOUS
  Filled 2022-10-13: qty 10

## 2022-10-13 MED ORDER — ATROPINE SULFATE 1 MG/ML IV SOLN
0.5000 mg | Freq: Once | INTRAVENOUS | Status: AC | PRN
  Administered 2022-10-13: 0.5 mg via INTRAVENOUS
  Filled 2022-10-13: qty 1

## 2022-10-13 MED ORDER — SODIUM CHLORIDE 0.9 % IV SOLN
70.0000 mg/m2 | Freq: Once | INTRAVENOUS | Status: AC
  Administered 2022-10-13: 154.8 mg via INTRAVENOUS
  Filled 2022-10-13: qty 36

## 2022-10-13 MED ORDER — SODIUM CHLORIDE 0.9 % IV SOLN
5000.0000 mg | INTRAVENOUS | Status: DC
  Administered 2022-10-13: 5000 mg via INTRAVENOUS
  Filled 2022-10-13: qty 100

## 2022-10-13 MED ORDER — PALONOSETRON HCL INJECTION 0.25 MG/5ML
0.2500 mg | Freq: Once | INTRAVENOUS | Status: AC
  Administered 2022-10-13: 0.25 mg via INTRAVENOUS
  Filled 2022-10-13: qty 5

## 2022-10-13 MED ORDER — SODIUM CHLORIDE 0.9% FLUSH
10.0000 mL | Freq: Once | INTRAVENOUS | Status: AC
  Administered 2022-10-13: 10 mL

## 2022-10-13 MED ORDER — SODIUM CHLORIDE 0.9 % IV SOLN
400.0000 mg/m2 | Freq: Once | INTRAVENOUS | Status: AC
  Administered 2022-10-13: 880 mg via INTRAVENOUS
  Filled 2022-10-13: qty 44

## 2022-10-13 MED ORDER — SODIUM CHLORIDE 0.9 % IV SOLN: Freq: Once | INTRAVENOUS | Status: AC

## 2022-10-13 NOTE — Patient Instructions (Signed)
[facility] ONCOLOGY  Discharge Instructions: Thank you for choosing [facility] to provide your oncology and hematology care.   If you have a lab appointment with the [facility], please go directly to the [facility] and check in at the registration area.   Wear comfortable clothing and clothing appropriate for easy access to any Portacath or PICC line.   We strive to give you quality time with your provider. You may need to reschedule your appointment if you arrive late (15 or more minutes).  Arriving late affects you and other patients whose appointments are after yours.  Also, if you miss three or more appointments without notifying the office, you may be dismissed from the clinic at the provider's discretion.      For prescription refill requests, have your pharmacy contact our office and allow 72 hours for refills to be completed.    Today you received the following chemotherapy and/or immunotherapy agents Irinotecan, Leucovorin, Fluorouracil      To help prevent nausea and vomiting after your treatment, we encourage you to take your nausea medication as directed.  BELOW ARE SYMPTOMS THAT SHOULD BE REPORTED IMMEDIATELY: *FEVER GREATER THAN 100.4 F (38 C) OR HIGHER *CHILLS OR SWEATING *NAUSEA AND VOMITING THAT IS NOT CONTROLLED WITH YOUR NAUSEA MEDICATION *UNUSUAL SHORTNESS OF BREATH *UNUSUAL BRUISING OR BLEEDING *URINARY PROBLEMS (pain or burning when urinating, or frequent urination) *BOWEL PROBLEMS (unusual diarrhea, constipation, pain near the anus) TENDERNESS IN MOUTH AND THROAT WITH OR WITHOUT PRESENCE OF ULCERS (sore throat, sores in mouth, or a toothache) UNUSUAL RASH, SWELLING OR PAIN  UNUSUAL VAGINAL DISCHARGE OR ITCHING   Items with * indicate a potential emergency and should be followed up as soon as possible or go to the Emergency Department if any problems should occur.  Please show the CHEMOTHERAPY ALERT CARD or IMMUNOTHERAPY  ALERT CARD at check-in to the Emergency Department and triage nurse.  Should you have questions after your visit or need to cancel or reschedule your appointment, please contact [facility]  Dept: [phone]  and follow the prompts.  Office hours are 8:00 a.m. to 4:30 p.m. Monday - Friday. Please note that voicemails left after 4:00 p.m. may not be returned until the following business day.  We are closed weekends and major holidays. You have access to a nurse at all times for urgent questions. Please call the main number to the clinic Dept: [phone] and follow the prompts.   For any non-urgent questions, you may also contact your provider using MyChart. We now offer e-Visits for anyone 24 and older to request care online for non-urgent symptoms. For details visit [email or web address].   Also download the MyChart app! Go to the app store, search "MyChart", open the app, select [facility], and log in with your MyChart username and password.  Masks are optional in the cancer centers. If you would like for your care team to wear a mask while they are taking care of you, please let them know. You may have one support person who is at least 78 years old accompany you for your appointments.

## 2022-10-13 NOTE — Progress Notes (Signed)
Okay to treat with elevated LFTs per Regan Rakers NP
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
## 2022-10-15 ENCOUNTER — Other Ambulatory Visit: Payer: Self-pay | Admitting: Medical Oncology

## 2022-10-15 ENCOUNTER — Inpatient Hospital Stay: Payer: Medicare Other | Attending: Hematology

## 2022-10-15 VITALS — BP 128/66 | HR 79 | Temp 98.7°F | Resp 18

## 2022-10-15 DIAGNOSIS — Z5189 Encounter for other specified aftercare: Secondary | ICD-10-CM | POA: Diagnosis not present

## 2022-10-15 DIAGNOSIS — C250 Malignant neoplasm of head of pancreas: Secondary | ICD-10-CM | POA: Insufficient documentation

## 2022-10-15 DIAGNOSIS — C259 Malignant neoplasm of pancreas, unspecified: Secondary | ICD-10-CM

## 2022-10-15 DIAGNOSIS — Z5111 Encounter for antineoplastic chemotherapy: Secondary | ICD-10-CM | POA: Diagnosis not present

## 2022-10-15 MED ORDER — SODIUM CHLORIDE 0.9% FLUSH
10.0000 mL | INTRAVENOUS | Status: DC | PRN
  Administered 2022-10-15: 10 mL

## 2022-10-15 MED ORDER — HEPARIN SOD (PORK) LOCK FLUSH 100 UNIT/ML IV SOLN
500.0000 [IU] | Freq: Once | INTRAVENOUS | Status: AC | PRN
  Administered 2022-10-15: 500 [IU]

## 2022-10-21 ENCOUNTER — Other Ambulatory Visit: Payer: Self-pay | Admitting: Internal Medicine

## 2022-10-22 DIAGNOSIS — C259 Malignant neoplasm of pancreas, unspecified: Secondary | ICD-10-CM | POA: Diagnosis not present

## 2022-10-22 DIAGNOSIS — C250 Malignant neoplasm of head of pancreas: Secondary | ICD-10-CM | POA: Diagnosis not present
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
## 2022-10-27 DIAGNOSIS — H5203 Hypermetropia, bilateral: Secondary | ICD-10-CM | POA: Diagnosis not present

## 2022-10-27 DIAGNOSIS — E119 Type 2 diabetes mellitus without complications: Secondary | ICD-10-CM | POA: Diagnosis not present

## 2022-10-27 LAB — HM DIABETES EYE EXAM

## 2022-10-27 MED FILL — Dexamethasone Sodium Phosphate Inj 100 MG/10ML: INTRAMUSCULAR | Qty: 1 | Status: AC

## 2022-10-28 ENCOUNTER — Encounter: Payer: Self-pay | Admitting: Nurse Practitioner

## 2022-10-28 ENCOUNTER — Telehealth: Payer: Medicare Other

## 2022-10-28 ENCOUNTER — Inpatient Hospital Stay: Payer: Medicare Other

## 2022-10-28 ENCOUNTER — Inpatient Hospital Stay (HOSPITAL_BASED_OUTPATIENT_CLINIC_OR_DEPARTMENT_OTHER): Payer: Medicare Other | Admitting: Nurse Practitioner

## 2022-10-28 DIAGNOSIS — C250 Malignant neoplasm of head of pancreas: Secondary | ICD-10-CM | POA: Diagnosis not present

## 2022-10-28 DIAGNOSIS — C259 Malignant neoplasm of pancreas, unspecified: Secondary | ICD-10-CM

## 2022-10-28 DIAGNOSIS — Z95828 Presence of other vascular implants and grafts: Secondary | ICD-10-CM

## 2022-10-28 DIAGNOSIS — Z5189 Encounter for other specified aftercare: Secondary | ICD-10-CM | POA: Diagnosis not present

## 2022-10-28 DIAGNOSIS — Z5111 Encounter for antineoplastic chemotherapy: Secondary | ICD-10-CM | POA: Diagnosis not present

## 2022-10-28 LAB — CBC WITH DIFFERENTIAL (CANCER CENTER ONLY)
Abs Immature Granulocytes: 0 10*3/uL (ref 0.00–0.07)
Basophils Absolute: 0 10*3/uL (ref 0.0–0.1)
Basophils Relative: 1 %
Eosinophils Absolute: 0.1 10*3/uL (ref 0.0–0.5)
Eosinophils Relative: 3 %
HCT: 34 % — ABNORMAL LOW (ref 39.0–52.0)
Hemoglobin: 12.1 g/dL — ABNORMAL LOW (ref 13.0–17.0)
Immature Granulocytes: 0 %
Lymphocytes Relative: 32 %
Lymphs Abs: 0.8 10*3/uL (ref 0.7–4.0)
MCH: 31 pg (ref 26.0–34.0)
MCHC: 35.6 g/dL (ref 30.0–36.0)
MCV: 87.2 fL (ref 80.0–100.0)
Monocytes Absolute: 0.4 10*3/uL (ref 0.1–1.0)
Monocytes Relative: 15 %
Neutro Abs: 1.2 10*3/uL — ABNORMAL LOW (ref 1.7–7.7)
Neutrophils Relative %: 49 %
Platelet Count: 181 10*3/uL (ref 150–400)
RBC: 3.9 MIL/uL — ABNORMAL LOW (ref 4.22–5.81)
RDW: 15.3 % (ref 11.5–15.5)
WBC Count: 2.5 10*3/uL — ABNORMAL LOW (ref 4.0–10.5)
nRBC: 0 % (ref 0.0–0.2)

## 2022-10-28 LAB — CMP (CANCER CENTER ONLY)
ALT: 114 U/L — ABNORMAL HIGH (ref 0–44)
AST: 102 U/L — ABNORMAL HIGH (ref 15–41)
Albumin: 4 g/dL (ref 3.5–5.0)
Alkaline Phosphatase: 240 U/L — ABNORMAL HIGH (ref 38–126)
Anion gap: 5 (ref 5–15)
BUN: 17 mg/dL (ref 8–23)
CO2: 28 mmol/L (ref 22–32)
Calcium: 9.6 mg/dL (ref 8.9–10.3)
Chloride: 106 mmol/L (ref 98–111)
Creatinine: 0.94 mg/dL (ref 0.61–1.24)
GFR, Estimated: >60 mL/min (ref >60)
Glucose, Bld: 113 mg/dL — ABNORMAL HIGH (ref 70–99)
Potassium: 4.4 mmol/L (ref 3.5–5.1)
Sodium: 139 mmol/L (ref 135–145)
Total Bilirubin: 0.6 mg/dL (ref 0.3–1.2)
Total Protein: 6.5 g/dL (ref 6.5–8.1)

## 2022-10-28 MED ORDER — SODIUM CHLORIDE 0.9% FLUSH
10.0000 mL | Freq: Once | INTRAVENOUS | Status: AC
  Administered 2022-10-28: 10 mL

## 2022-10-28 MED ORDER — SODIUM CHLORIDE 0.9 % IV SOLN
2280.0000 mg/m2 | INTRAVENOUS | Status: DC
  Administered 2022-10-28: 5000 mg via INTRAVENOUS
  Filled 2022-10-28: qty 100

## 2022-10-28 MED ORDER — PALONOSETRON HCL INJECTION 0.25 MG/5ML
0.2500 mg | Freq: Once | INTRAVENOUS | Status: AC
  Administered 2022-10-28: 0.25 mg via INTRAVENOUS
  Filled 2022-10-28: qty 5

## 2022-10-28 MED ORDER — SODIUM CHLORIDE 0.9 % IV SOLN
10.0000 mg | Freq: Once | INTRAVENOUS | Status: AC
  Administered 2022-10-28: 10 mg via INTRAVENOUS
  Filled 2022-10-28: qty 10

## 2022-10-28 MED ORDER — SODIUM CHLORIDE 0.9 % IV SOLN: Freq: Once | INTRAVENOUS | Status: AC

## 2022-10-28 MED ORDER — SODIUM CHLORIDE 0.9 % IV SOLN
70.0000 mg/m2 | Freq: Once | INTRAVENOUS | Status: AC
  Administered 2022-10-28: 154.8 mg via INTRAVENOUS
  Filled 2022-10-28: qty 36

## 2022-10-28 MED ORDER — SODIUM CHLORIDE 0.9 % IV SOLN
400.0000 mg/m2 | Freq: Once | INTRAVENOUS | Status: AC
  Administered 2022-10-28: 880 mg via INTRAVENOUS
  Filled 2022-10-28: qty 44

## 2022-10-28 NOTE — Progress Notes (Signed)
Ok to treat with ANC of 1.2 Ast 102, Alt 114 per provider

## 2022-10-30 ENCOUNTER — Inpatient Hospital Stay: Payer: Medicare Other

## 2022-10-30 ENCOUNTER — Encounter: Payer: Self-pay | Admitting: Internal Medicine

## 2022-10-30 VITALS — BP 158/65 | HR 85 | Temp 98.4°F | Resp 19 | Ht 68.0 in

## 2022-10-30 DIAGNOSIS — Z5111 Encounter for antineoplastic chemotherapy: Secondary | ICD-10-CM | POA: Diagnosis not present

## 2022-10-30 DIAGNOSIS — Z5189 Encounter for other specified aftercare: Secondary | ICD-10-CM | POA: Diagnosis not present

## 2022-10-30 DIAGNOSIS — C250 Malignant neoplasm of head of pancreas: Secondary | ICD-10-CM | POA: Diagnosis not present

## 2022-10-30 DIAGNOSIS — C259 Malignant neoplasm of pancreas, unspecified: Secondary | ICD-10-CM

## 2022-10-30 MED ORDER — PEGFILGRASTIM-CBQV 6 MG/0.6ML ~~LOC~~ SOSY
6.0000 mg | PREFILLED_SYRINGE | Freq: Once | SUBCUTANEOUS | Status: AC
  Administered 2022-10-30: 6 mg via SUBCUTANEOUS

## 2022-10-30 MED ORDER — HEPARIN SOD (PORK) LOCK FLUSH 100 UNIT/ML IV SOLN
500.0000 [IU] | Freq: Once | INTRAVENOUS | Status: AC | PRN
  Administered 2022-10-30: 500 [IU]

## 2022-10-30 MED ORDER — SODIUM CHLORIDE 0.9% FLUSH
10.0000 mL | INTRAVENOUS | Status: DC | PRN
  Administered 2022-10-30: 10 mL

## 2022-10-30 NOTE — Patient Instructions (Signed)

## 2022-11-02 NOTE — Telephone Encounter (Signed)
Patient called to follow up on this, He asked for a call back to let him know.

## 2022-11-03 DIAGNOSIS — R3912 Poor urinary stream: Secondary | ICD-10-CM | POA: Diagnosis not present

## 2022-11-08 DIAGNOSIS — C259 Malignant neoplasm of pancreas, unspecified: Secondary | ICD-10-CM | POA: Diagnosis not present

## 2022-11-09 ENCOUNTER — Other Ambulatory Visit: Payer: Self-pay | Admitting: Hematology

## 2022-11-09 DIAGNOSIS — C259 Malignant neoplasm of pancreas, unspecified: Secondary | ICD-10-CM

## 2022-11-10 MED FILL — Dexamethasone Sodium Phosphate Inj 100 MG/10ML: INTRAMUSCULAR | Qty: 1 | Status: AC

## 2022-11-11 ENCOUNTER — Inpatient Hospital Stay: Payer: Medicare Other

## 2022-11-11 ENCOUNTER — Inpatient Hospital Stay: Payer: Medicare Other | Admitting: Nurse Practitioner

## 2022-11-11 ENCOUNTER — Encounter: Payer: Self-pay | Admitting: Nurse Practitioner

## 2022-11-11 ENCOUNTER — Ambulatory Visit: Payer: Medicare Other | Admitting: Nurse Practitioner

## 2022-11-11 VITALS — BP 136/72 | HR 68 | Temp 98.3°F | Resp 16 | Wt 226.5 lb

## 2022-11-11 DIAGNOSIS — C259 Malignant neoplasm of pancreas, unspecified: Secondary | ICD-10-CM

## 2022-11-11 DIAGNOSIS — Z5189 Encounter for other specified aftercare: Secondary | ICD-10-CM | POA: Diagnosis not present

## 2022-11-11 DIAGNOSIS — C250 Malignant neoplasm of head of pancreas: Secondary | ICD-10-CM | POA: Diagnosis not present

## 2022-11-11 DIAGNOSIS — Z95828 Presence of other vascular implants and grafts: Secondary | ICD-10-CM

## 2022-11-11 DIAGNOSIS — Z5111 Encounter for antineoplastic chemotherapy: Secondary | ICD-10-CM | POA: Diagnosis not present

## 2022-11-11 LAB — CMP (CANCER CENTER ONLY)
ALT: 32 U/L (ref 0–44)
AST: 30 U/L (ref 15–41)
Albumin: 3.9 g/dL (ref 3.5–5.0)
Alkaline Phosphatase: 174 U/L — ABNORMAL HIGH (ref 38–126)
Anion gap: 5 (ref 5–15)
BUN: 12 mg/dL (ref 8–23)
CO2: 29 mmol/L (ref 22–32)
Calcium: 9.3 mg/dL (ref 8.9–10.3)
Chloride: 107 mmol/L (ref 98–111)
Creatinine: 1.01 mg/dL (ref 0.61–1.24)
GFR, Estimated: >60 mL/min (ref >60)
Glucose, Bld: 121 mg/dL — ABNORMAL HIGH (ref 70–99)
Potassium: 3.8 mmol/L (ref 3.5–5.1)
Sodium: 141 mmol/L (ref 135–145)
Total Bilirubin: 0.5 mg/dL (ref 0.3–1.2)
Total Protein: 6.5 g/dL (ref 6.5–8.1)

## 2022-11-11 LAB — CBC WITH DIFFERENTIAL (CANCER CENTER ONLY)
Abs Immature Granulocytes: 0.48 10*3/uL — ABNORMAL HIGH (ref 0.00–0.07)
Basophils Absolute: 0.1 10*3/uL (ref 0.0–0.1)
Basophils Relative: 1 %
Eosinophils Absolute: 0.1 10*3/uL (ref 0.0–0.5)
Eosinophils Relative: 2 %
HCT: 36.6 % — ABNORMAL LOW (ref 39.0–52.0)
Hemoglobin: 12.3 g/dL — ABNORMAL LOW (ref 13.0–17.0)
Immature Granulocytes: 7 %
Lymphocytes Relative: 13 %
Lymphs Abs: 0.9 10*3/uL (ref 0.7–4.0)
MCH: 30.1 pg (ref 26.0–34.0)
MCHC: 33.6 g/dL (ref 30.0–36.0)
MCV: 89.5 fL (ref 80.0–100.0)
Monocytes Absolute: 0.7 10*3/uL (ref 0.1–1.0)
Monocytes Relative: 10 %
Neutro Abs: 4.6 10*3/uL (ref 1.7–7.7)
Neutrophils Relative %: 67 %
Platelet Count: 140 10*3/uL — ABNORMAL LOW (ref 150–400)
RBC: 4.09 MIL/uL — ABNORMAL LOW (ref 4.22–5.81)
RDW: 15.9 % — ABNORMAL HIGH (ref 11.5–15.5)
WBC Count: 6.9 10*3/uL (ref 4.0–10.5)
nRBC: 0 % (ref 0.0–0.2)

## 2022-11-11 MED ORDER — SODIUM CHLORIDE 0.9 % IV SOLN
400.0000 mg/m2 | Freq: Once | INTRAVENOUS | Status: AC
  Administered 2022-11-11: 880 mg via INTRAVENOUS
  Filled 2022-11-11: qty 44

## 2022-11-11 MED ORDER — SODIUM CHLORIDE 0.9 % IV SOLN
10.0000 mg | Freq: Once | INTRAVENOUS | Status: AC
  Administered 2022-11-11: 10 mg via INTRAVENOUS
  Filled 2022-11-11: qty 10

## 2022-11-11 MED ORDER — SODIUM CHLORIDE 0.9 % IV SOLN
2280.0000 mg/m2 | INTRAVENOUS | Status: DC
  Administered 2022-11-11: 5000 mg via INTRAVENOUS
  Filled 2022-11-11: qty 100

## 2022-11-11 MED ORDER — SODIUM CHLORIDE 0.9% FLUSH
10.0000 mL | Freq: Once | INTRAVENOUS | Status: AC
  Administered 2022-11-11: 10 mL

## 2022-11-11 MED ORDER — SODIUM CHLORIDE 0.9 % IV SOLN: Freq: Once | INTRAVENOUS | Status: AC

## 2022-11-11 MED ORDER — SODIUM CHLORIDE 0.9 % IV SOLN
70.0000 mg/m2 | Freq: Once | INTRAVENOUS | Status: AC
  Administered 2022-11-11: 154.8 mg via INTRAVENOUS
  Filled 2022-11-11: qty 36

## 2022-11-11 MED ORDER — SODIUM CHLORIDE 0.9% FLUSH
10.0000 mL | INTRAVENOUS | Status: DC | PRN
  Administered 2022-11-11: 10 mL

## 2022-11-11 MED ORDER — PALONOSETRON HCL INJECTION 0.25 MG/5ML
0.2500 mg | Freq: Once | INTRAVENOUS | Status: AC
  Administered 2022-11-11: 0.25 mg via INTRAVENOUS
  Filled 2022-11-11: qty 5

## 2022-11-11 MED ORDER — HEPARIN SOD (PORK) LOCK FLUSH 100 UNIT/ML IV SOLN: 500.0000 [IU] | Freq: Once | INTRAVENOUS | Status: DC | PRN

## 2022-11-11 NOTE — Patient Instructions (Addendum)
[facility]  Discharge Instructions: Thank you for choosing [facility] to provide your oncology and hematology care.   If you have a lab appointment with the [facility], please go directly to the [facility] and check in at the registration area.   Wear comfortable clothing and clothing appropriate for easy access to any Portacath or PICC line.   We strive to give you quality time with your provider. You may need to reschedule your appointment if you arrive late (15 or more minutes).  Arriving late affects you and other patients whose appointments are after yours.  Also, if you miss three or more appointments without notifying the office, you may be dismissed from the clinic at the provider's discretion.      For prescription refill requests, have your pharmacy contact our office and allow 72 hours for refills to be completed.    Today you received the following chemotherapy and/or immunotherapy agents: Irinotecan Liposome/Leucovorin/Fluorouracil       To help prevent nausea and vomiting after your treatment, we encourage you to take your nausea medication as directed.  BELOW ARE SYMPTOMS THAT SHOULD BE REPORTED IMMEDIATELY: *FEVER GREATER THAN 100.4 F (38 C) OR HIGHER *CHILLS OR SWEATING *NAUSEA AND VOMITING THAT IS NOT CONTROLLED WITH YOUR NAUSEA MEDICATION *UNUSUAL SHORTNESS OF BREATH *UNUSUAL BRUISING OR BLEEDING *URINARY PROBLEMS (pain or burning when urinating, or frequent urination) *BOWEL PROBLEMS (unusual diarrhea, constipation, pain near the anus) TENDERNESS IN MOUTH AND THROAT WITH OR WITHOUT PRESENCE OF ULCERS (sore throat, sores in mouth, or a toothache) UNUSUAL RASH, SWELLING OR PAIN  UNUSUAL VAGINAL DISCHARGE OR ITCHING   Items with * indicate a potential emergency and should be followed up as soon as possible or go to the Emergency Department if any problems should occur.  Please show the CHEMOTHERAPY ALERT CARD or  IMMUNOTHERAPY ALERT CARD at check-in to the Emergency Department and triage nurse.  Should you have questions after your visit or need to cancel or reschedule your appointment, please contact [facility]  Dept: [phone]  and follow the prompts.  Office hours are 8:00 a.m. to 4:30 p.m. Monday - Friday. Please note that voicemails left after 4:00 p.m. may not be returned until the following business day.  We are closed weekends and major holidays. You have access to a nurse at all times for urgent questions. Please call the main number to the clinic Dept: [phone] and follow the prompts.   For any non-urgent questions, you may also contact your provider using MyChart. We now offer e-Visits for anyone 68 and older to request care online for non-urgent symptoms. For details visit [email or web address].   Also download the MyChart app! Go to the app store, search "MyChart", open the app, select [facility], and log in with your MyChart username and password.  The chemotherapy medication bag should finish at 46 hours, 96 hours, or 7 days. For example, if your pump is scheduled for 46 hours and it was put on at 4:00 p.m., it should finish at 2:00 p.m. the day it is scheduled to come off regardless of your appointment time.     Estimated time to finish at 1 PM on Saturday 11/13/22.   If the display on your pump reads "Low Volume" and it is beeping, take the batteries out of the pump and come to the cancer center for it to be taken off.   If the pump alarms go off prior  to the pump reading "Low Volume" then call [phone] and someone can assist you.  If the plunger comes out and the chemotherapy medication is leaking out, please use your home chemo spill kit to clean up the spill. Do NOT use paper towels or other household products.  If you have problems or questions regarding your pump, please call either [phone] (24 hours a day) or the cancer center  Monday-Friday 8:00 a.m.- 4:30 p.m. at the clinic number and we will assist you. If you are unable to get assistance, then go to the nearest Emergency Department and ask the staff to contact the IV team for assistance.
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
## 2022-11-11 NOTE — Progress Notes (Signed)
Pt presented to infusion today with c/o worsening peripheral neuropathy in his fingers and hands. Pt stated "it is hard to pick things up at times".  This RN made Lacie PA-C aware. Lacie PA-C recommended Pt use cryotherapy during infusions. Pt was educated on cryotherapy. Pt verbalized understanding and agreeable to proceeding with cryotherapy during infusions.

## 2022-11-12 ENCOUNTER — Telehealth: Payer: Self-pay | Admitting: Hematology

## 2022-11-12 ENCOUNTER — Encounter: Payer: Self-pay | Admitting: Internal Medicine

## 2022-11-12 NOTE — Progress Notes (Signed)
Outside notes received. Information abstracted. Notes sent to scan.  

## 2022-11-12 NOTE — Telephone Encounter (Signed)
Patient aware of upcoming appointments  

## 2022-11-13 ENCOUNTER — Inpatient Hospital Stay: Payer: Medicare Other

## 2022-11-13 VITALS — BP 122/57 | HR 86 | Temp 97.9°F | Resp 18

## 2022-11-13 DIAGNOSIS — C259 Malignant neoplasm of pancreas, unspecified: Secondary | ICD-10-CM

## 2022-11-13 DIAGNOSIS — Z5189 Encounter for other specified aftercare: Secondary | ICD-10-CM | POA: Diagnosis not present

## 2022-11-13 DIAGNOSIS — Z5111 Encounter for antineoplastic chemotherapy: Secondary | ICD-10-CM | POA: Diagnosis not present

## 2022-11-13 DIAGNOSIS — C250 Malignant neoplasm of head of pancreas: Secondary | ICD-10-CM | POA: Diagnosis not present

## 2022-11-13 MED ORDER — HEPARIN SOD (PORK) LOCK FLUSH 100 UNIT/ML IV SOLN
500.0000 [IU] | Freq: Once | INTRAVENOUS | Status: AC | PRN
  Administered 2022-11-13: 500 [IU]

## 2022-11-13 MED ORDER — SODIUM CHLORIDE 0.9% FLUSH
10.0000 mL | INTRAVENOUS | Status: DC | PRN
  Administered 2022-11-13: 10 mL

## 2022-11-24 MED FILL — Dexamethasone Sodium Phosphate Inj 100 MG/10ML: INTRAMUSCULAR | Qty: 1 | Status: AC

## 2022-11-25 ENCOUNTER — Encounter: Payer: Self-pay | Admitting: Nurse Practitioner

## 2022-11-25 ENCOUNTER — Inpatient Hospital Stay: Payer: Medicare Other

## 2022-11-25 ENCOUNTER — Inpatient Hospital Stay (HOSPITAL_BASED_OUTPATIENT_CLINIC_OR_DEPARTMENT_OTHER): Payer: Medicare Other | Admitting: Nurse Practitioner

## 2022-11-25 ENCOUNTER — Inpatient Hospital Stay: Payer: Medicare Other | Attending: Hematology

## 2022-11-25 VITALS — BP 135/72 | HR 65 | Resp 16

## 2022-11-25 DIAGNOSIS — Z5111 Encounter for antineoplastic chemotherapy: Secondary | ICD-10-CM | POA: Diagnosis not present

## 2022-11-25 DIAGNOSIS — C259 Malignant neoplasm of pancreas, unspecified: Secondary | ICD-10-CM | POA: Diagnosis not present

## 2022-11-25 DIAGNOSIS — C250 Malignant neoplasm of head of pancreas: Secondary | ICD-10-CM | POA: Insufficient documentation

## 2022-11-25 DIAGNOSIS — Z5189 Encounter for other specified aftercare: Secondary | ICD-10-CM | POA: Insufficient documentation

## 2022-11-25 DIAGNOSIS — Z95828 Presence of other vascular implants and grafts: Secondary | ICD-10-CM

## 2022-11-25 DIAGNOSIS — C7989 Secondary malignant neoplasm of other specified sites: Secondary | ICD-10-CM | POA: Insufficient documentation

## 2022-11-25 LAB — CMP (CANCER CENTER ONLY)
ALT: 24 U/L (ref 0–44)
AST: 22 U/L (ref 15–41)
Albumin: 4 g/dL (ref 3.5–5.0)
Alkaline Phosphatase: 120 U/L (ref 38–126)
Anion gap: 5 (ref 5–15)
BUN: 9 mg/dL (ref 8–23)
CO2: 27 mmol/L (ref 22–32)
Calcium: 9.5 mg/dL (ref 8.9–10.3)
Chloride: 107 mmol/L (ref 98–111)
Creatinine: 0.93 mg/dL (ref 0.61–1.24)
GFR, Estimated: >60 mL/min (ref >60)
Glucose, Bld: 97 mg/dL (ref 70–99)
Potassium: 4.2 mmol/L (ref 3.5–5.1)
Sodium: 139 mmol/L (ref 135–145)
Total Bilirubin: 0.4 mg/dL (ref 0.3–1.2)
Total Protein: 6.4 g/dL — ABNORMAL LOW (ref 6.5–8.1)

## 2022-11-25 LAB — CBC WITH DIFFERENTIAL (CANCER CENTER ONLY)
Abs Immature Granulocytes: 0.03 10*3/uL (ref 0.00–0.07)
Basophils Absolute: 0 10*3/uL (ref 0.0–0.1)
Basophils Relative: 1 %
Eosinophils Absolute: 0.2 10*3/uL (ref 0.0–0.5)
Eosinophils Relative: 6 %
HCT: 32.5 % — ABNORMAL LOW (ref 39.0–52.0)
Hemoglobin: 11.4 g/dL — ABNORMAL LOW (ref 13.0–17.0)
Immature Granulocytes: 1 %
Lymphocytes Relative: 24 %
Lymphs Abs: 0.6 10*3/uL — ABNORMAL LOW (ref 0.7–4.0)
MCH: 30.8 pg (ref 26.0–34.0)
MCHC: 35.1 g/dL (ref 30.0–36.0)
MCV: 87.8 fL (ref 80.0–100.0)
Monocytes Absolute: 0.4 10*3/uL (ref 0.1–1.0)
Monocytes Relative: 16 %
Neutro Abs: 1.3 10*3/uL — ABNORMAL LOW (ref 1.7–7.7)
Neutrophils Relative %: 52 %
Platelet Count: 190 10*3/uL (ref 150–400)
RBC: 3.7 MIL/uL — ABNORMAL LOW (ref 4.22–5.81)
RDW: 15.3 % (ref 11.5–15.5)
WBC Count: 2.6 10*3/uL — ABNORMAL LOW (ref 4.0–10.5)
nRBC: 0 % (ref 0.0–0.2)

## 2022-11-25 MED ORDER — SODIUM CHLORIDE 0.9 % IV SOLN
5000.0000 mg | INTRAVENOUS | Status: DC
  Administered 2022-11-25: 5000 mg via INTRAVENOUS
  Filled 2022-11-25: qty 100

## 2022-11-25 MED ORDER — SODIUM CHLORIDE 0.9 % IV SOLN
10.0000 mg | Freq: Once | INTRAVENOUS | Status: AC
  Administered 2022-11-25: 10 mg via INTRAVENOUS
  Filled 2022-11-25: qty 10

## 2022-11-25 MED ORDER — PALONOSETRON HCL INJECTION 0.25 MG/5ML
0.2500 mg | Freq: Once | INTRAVENOUS | Status: AC
  Administered 2022-11-25: 0.25 mg via INTRAVENOUS
  Filled 2022-11-25: qty 5

## 2022-11-25 MED ORDER — SODIUM CHLORIDE 0.9% FLUSH
10.0000 mL | Freq: Once | INTRAVENOUS | Status: AC
  Administered 2022-11-25: 10 mL

## 2022-11-25 MED ORDER — SODIUM CHLORIDE 0.9% FLUSH
10.0000 mL | INTRAVENOUS | Status: DC | PRN
  Administered 2022-11-25: 10 mL

## 2022-11-25 MED ORDER — ATROPINE SULFATE 1 MG/ML IV SOLN
0.5000 mg | Freq: Once | INTRAVENOUS | Status: AC | PRN
  Administered 2022-11-25: 0.5 mg via INTRAVENOUS
  Filled 2022-11-25: qty 1

## 2022-11-25 MED ORDER — SODIUM CHLORIDE 0.9 % IV SOLN
70.0000 mg/m2 | Freq: Once | INTRAVENOUS | Status: AC
  Administered 2022-11-25: 154.8 mg via INTRAVENOUS
  Filled 2022-11-25: qty 36

## 2022-11-25 MED ORDER — SODIUM CHLORIDE 0.9 % IV SOLN: Freq: Once | INTRAVENOUS | Status: AC

## 2022-11-25 MED ORDER — SODIUM CHLORIDE 0.9 % IV SOLN
400.0000 mg/m2 | Freq: Once | INTRAVENOUS | Status: AC
  Administered 2022-11-25: 880 mg via INTRAVENOUS
  Filled 2022-11-25: qty 17.5

## 2022-11-25 MED ORDER — HEPARIN SOD (PORK) LOCK FLUSH 100 UNIT/ML IV SOLN: 500.0000 [IU] | Freq: Once | INTRAVENOUS | Status: DC | PRN

## 2022-11-25 MED ORDER — COLD PACK MISC ONCOLOGY: 1.0000 | Freq: Once | Status: DC | PRN

## 2022-11-25 NOTE — Patient Instructions (Addendum)
[facility]  Discharge Instructions: Thank you for choosing [facility] to provide your oncology and hematology care.   If you have a lab appointment with the [facility], please go directly to the [facility] and check in at the registration area.   Wear comfortable clothing and clothing appropriate for easy access to any Portacath or PICC line.   We strive to give you quality time with your provider. You may need to reschedule your appointment if you arrive late (15 or more minutes).  Arriving late affects you and other patients whose appointments are after yours.  Also, if you miss three or more appointments without notifying the office, you may be dismissed from the clinic at the provider's discretion.      For prescription refill requests, have your pharmacy contact our office and allow 72 hours for refills to be completed.    Today you received the following chemotherapy and/or immunotherapy agents: Irinotecan Liposomal, Leucovorin, and Fluorouracil.     To help prevent nausea and vomiting after your treatment, we encourage you to take your nausea medication as directed.  BELOW ARE SYMPTOMS THAT SHOULD BE REPORTED IMMEDIATELY: *FEVER GREATER THAN 100.4 F (38 C) OR HIGHER *CHILLS OR SWEATING *NAUSEA AND VOMITING THAT IS NOT CONTROLLED WITH YOUR NAUSEA MEDICATION *UNUSUAL SHORTNESS OF BREATH *UNUSUAL BRUISING OR BLEEDING *URINARY PROBLEMS (pain or burning when urinating, or frequent urination) *BOWEL PROBLEMS (unusual diarrhea, constipation, pain near the anus) TENDERNESS IN MOUTH AND THROAT WITH OR WITHOUT PRESENCE OF ULCERS (sore throat, sores in mouth, or a toothache) UNUSUAL RASH, SWELLING OR PAIN  UNUSUAL VAGINAL DISCHARGE OR ITCHING   Items with * indicate a potential emergency and should be followed up as soon as possible or go to the Emergency Department if any problems should occur.  Please show the CHEMOTHERAPY ALERT CARD or  IMMUNOTHERAPY ALERT CARD at check-in to the Emergency Department and triage nurse.  Should you have questions after your visit or need to cancel or reschedule your appointment, please contact [facility]  Dept: [phone]  and follow the prompts.  Office hours are 8:00 a.m. to 4:30 p.m. Monday - Friday. Please note that voicemails left after 4:00 p.m. may not be returned until the following business day.  We are closed weekends and major holidays. You have access to a nurse at all times for urgent questions. Please call the main number to the clinic Dept: [phone] and follow the prompts.   For any non-urgent questions, you may also contact your provider using MyChart. We now offer e-Visits for anyone 52 and older to request care online for non-urgent symptoms. For details visit [email or web address].   Also download the MyChart app! Go to the app store, search "MyChart", open the app, select [facility], and log in with your MyChart username and password.   The chemotherapy medication bag should finish at 46 hours, 96 hours, or 7 days. For example, if your pump is scheduled for 46 hours and it was put on at 4:00 p.m., it should finish at 2:00 p.m. the day it is scheduled to come off regardless of your appointment time.     Estimated time to finish at: 12:15 PM   If the display on your pump reads "Low Volume" and it is beeping, take the batteries out of the pump and come to the cancer center for it to be taken off.   If the pump alarms go off prior to  the pump reading "Low Volume" then call [phone] and someone can assist you.  If the plunger comes out and the chemotherapy medication is leaking out, please use your home chemo spill kit to clean up the spill. Do NOT use paper towels or other household products.  If you have problems or questions regarding your pump, please call either [phone] (24 hours a day) or the cancer center Monday-Friday 8:00  a.m.- 4:30 p.m. at the clinic number and we will assist you. If you are unable to get assistance, then go to the nearest Emergency Department and ask the staff to contact the IV team for assistance.   Irinotecan Liposome Injection What is this medication? IRINOTECAN LIPOSOME (eye ri noe TEE kan LIP oh som) treats pancreatic cancer. It works by slowing down the growth of cancer cells. This medicine may be used for other purposes; ask your health care provider or pharmacist if you have questions. COMMON BRAND NAME(S): ONIVYDE What should I tell my care team before I take this medication? They need to know if you have any of these conditions: Blockage in your bowels Dehydration Infection Low white blood cell levels Lung disease An unusual or allergic reaction to irinotecan liposome, irinotecan, other medications, foods, dyes, or preservatives Pregnant or trying to get pregnant Breast-feeding How should I use this medication? This medication is injected into a vein. It is given by your care team in a hospital or clinic setting. Talk to your care team about the use of this medication in children. Special care may be needed. Overdosage: If you think you have taken too much of this medicine contact a poison control center or emergency room at once. NOTE: This medicine is only for you. Do not share this medicine with others. What if I miss a dose? Keep appointments for follow-up doses. It is important not to miss your dose. Call your care team if you are unable to keep an appointment. What may interact with this medication? Do not take this medication with any of the following: Itraconazole This medication may also interact with the following: Certain antivirals for HIV or AIDS Certain medications for seizures, such as carbamazepine, fosphenytoin, phenytoin, phenobarbital Clarithromycin Gemfibrozil Nefazodone Rifabutin Rifampin Rifapentine St. John's Wort Voriconazole This list may not  describe all possible interactions. Give your health care provider a list of all the medicines, herbs, non-prescription drugs, or dietary supplements you use. Also tell them if you smoke, drink alcohol, or use illegal drugs. Some items may interact with your medicine. What should I watch for while using this medication? This medication may make you feel generally unwell. This is not uncommon as chemotherapy can affect healthy cells as well as cancer cells. Report any side effects. Continue your course of treatment even though you feel ill unless your care team tells you to stop. You may need blood work while you are taking this medication. This medication can cause serious side effects and allergic reactions. To reduce your risk, your care team may give you other medications to take before receiving this one. Be sure to follow the directions from your care team. Check with your care team if you get an attack of diarrhea, nausea and vomiting, or if you sweat a lot. The loss of too much body fluid can make it dangerous for you to take this medication. This medication may cause infertility. Talk to your care team if you are concerned about your fertility. Talk to your care team if you wish to become pregnant  or if you think you might be pregnant. This medication can cause serious birth defects if taken during pregnancy or if you get pregnant within 7 months after stopping therapy. A negative pregnancy test is required before starting this medication. A reliable form of contraception is recommended while taking this medication and for 7 months after stopping it. Talk to your care team about reliable forms of contraception. Use a condom during sex and for 4 months after stopping therapy. Tell your care team right away if you think your partner might be pregnant. This medication can cause serious birth defects. Do not breast-feed while taking this medication and for 1 month after stopping therapy. This  medication may increase your risk of getting an infection. Call your care team for advice if you get a fever, chills, sore throat, or other symptoms of a cold or flu. Do not treat yourself. Try to avoid being around people who are sick. Avoid taking medications that contain aspirin, acetaminophen, ibuprofen, naproxen, or ketoprofen unless instructed by your care team. These medications may hide a fever. Be careful brushing or flossing your teeth or using a toothpick because you may get an infection or bleed more easily. If you have any dental work done, tell your dentist you are receiving this medication. What side effects may I notice from receiving this medication? Side effects that you should report to your care team as soon as possible: Allergic reactions or angioedema--skin rash, itching or hives, swelling of the face, eyes, lips, tongue, arms, or legs, trouble swallowing or breathing Dry cough, shortness of breath or trouble breathing Diarrhea Infection--fever, chills, cough, or sore throat Side effects that usually do not require medical attention (report to your care team if they continue or are bothersome): Fatigue Loss of appetite Nausea Pain, redness, or swelling with sores inside the mouth or throat Vomiting This list may not describe all possible side effects. Call your doctor for medical advice about side effects. You may report side effects to FDA at 1-800-FDA-1088. Where should I keep my medication? This medication is given in a hospital or clinic. It will not be stored at home. NOTE: This sheet is a summary. It may not cover all possible information. If you have questions about this medicine, talk to your doctor, pharmacist, or health care provider.  2023 Elsevier/Gold Standard (2021-12-31 00:00:00) Leucovorin Injection What is this medication? LEUCOVORIN (loo koe VOR in) prevents side effects from certain medications, such as methotrexate. It works by increasing folate  levels. This helps protect healthy cells in your body. It may also be used to treat anemia caused by low levels of folate. It can also be used with fluorouracil, a type of chemotherapy, to treat colorectal cancer. It works by increasing the effects of fluorouracil in the body. This medicine may be used for other purposes; ask your health care provider or pharmacist if you have questions. What should I tell my care team before I take this medication? They need to know if you have any of these conditions: Anemia from low levels of vitamin B12 in the blood An unusual or allergic reaction to leucovorin, folic acid, other medications, foods, dyes, or preservatives Pregnant or trying to get pregnant Breastfeeding How should I use this medication? This medication is injected into a vein or a muscle. It is given by your care team in a hospital or clinic setting. Talk to your care team about the use of this medication in children. Special care may be needed. Overdosage: If  you think you have taken too much of this medicine contact a poison control center or emergency room at once. NOTE: This medicine is only for you. Do not share this medicine with others. What if I miss a dose? Keep appointments for follow-up doses. It is important not to miss your dose. Call your care team if you are unable to keep an appointment. What may interact with this medication? Capecitabine Fluorouracil Phenobarbital Phenytoin Primidone Trimethoprim;sulfamethoxazole This list may not describe all possible interactions. Give your health care provider a list of all the medicines, herbs, non-prescription drugs, or dietary supplements you use. Also tell them if you smoke, drink alcohol, or use illegal drugs. Some items may interact with your medicine. What should I watch for while using this medication? Your condition will be monitored carefully while you are receiving this medication. This medication may increase the side  effects of 5-fluorouracil. Tell your care team if you have diarrhea or mouth sores that do not get better or that get worse. What side effects may I notice from receiving this medication? Side effects that you should report to your care team as soon as possible: Allergic reactions--skin rash, itching, hives, swelling of the face, lips, tongue, or throat This list may not describe all possible side effects. Call your doctor for medical advice about side effects. You may report side effects to FDA at 1-800-FDA-1088. Where should I keep my medication? This medication is given in a hospital or clinic. It will not be stored at home. NOTE: This sheet is a summary. It may not cover all possible information. If you have questions about this medicine, talk to your doctor, pharmacist, or health care provider.  2023 Elsevier/Gold Standard (2022-04-06 00:00:00)  Fluorouracil Injection What is this medication? FLUOROURACIL (flure oh YOOR a sil) treats some types of cancer. It works by slowing down the growth of cancer cells. This medicine may be used for other purposes; ask your health care provider or pharmacist if you have questions. COMMON BRAND NAME(S): Adrucil What should I tell my care team before I take this medication? They need to know if you have any of these conditions: Blood disorders Dihydropyrimidine dehydrogenase (DPD) deficiency Infection, such as chickenpox, cold sores, herpes Kidney disease Liver disease Poor nutrition Recent or ongoing radiation therapy An unusual or allergic reaction to fluorouracil, other medications, foods, dyes, or preservatives If you or your partner are pregnant or trying to get pregnant Breast-feeding How should I use this medication? This medication is injected into a vein. It is administered by your care team in a hospital or clinic setting. Talk to your care team about the use of this medication in children. Special care may be needed. Overdosage: If  you think you have taken too much of this medicine contact a poison control center or emergency room at once. NOTE: This medicine is only for you. Do not share this medicine with others. What if I miss a dose? Keep appointments for follow-up doses. It is important not to miss your dose. Call your care team if you are unable to keep an appointment. What may interact with this medication? Do not take this medication with any of the following: Live virus vaccines This medication may also interact with the following: Medications that treat or prevent blood clots, such as warfarin, enoxaparin, dalteparin This list may not describe all possible interactions. Give your health care provider a list of all the medicines, herbs, non-prescription drugs, or dietary supplements you use. Also tell them  if you smoke, drink alcohol, or use illegal drugs. Some items may interact with your medicine. What should I watch for while using this medication? Your condition will be monitored carefully while you are receiving this medication. This medication may make you feel generally unwell. This is not uncommon as chemotherapy can affect healthy cells as well as cancer cells. Report any side effects. Continue your course of treatment even though you feel ill unless your care team tells you to stop. In some cases, you may be given additional medications to help with side effects. Follow all directions for their use. This medication may increase your risk of getting an infection. Call your care team for advice if you get a fever, chills, sore throat, or other symptoms of a cold or flu. Do not treat yourself. Try to avoid being around people who are sick. This medication may increase your risk to bruise or bleed. Call your care team if you notice any unusual bleeding. Be careful brushing or flossing your teeth or using a toothpick because you may get an infection or bleed more easily. If you have any dental work done, tell your  dentist you are receiving this medication. Avoid taking medications that contain aspirin, acetaminophen, ibuprofen, naproxen, or ketoprofen unless instructed by your care team. These medications may hide a fever. Do not treat diarrhea with over the counter products. Contact your care team if you have diarrhea that lasts more than 2 days or if it is severe and watery. This medication can make you more sensitive to the sun. Keep out of the sun. If you cannot avoid being in the sun, wear protective clothing and sunscreen. Do not use sun lamps, tanning beds, or tanning booths. Talk to your care team if you or your partner wish to become pregnant or think you might be pregnant. This medication can cause serious birth defects if taken during pregnancy and for 3 months after the last dose. A reliable form of contraception is recommended while taking this medication and for 3 months after the last dose. Talk to your care team about effective forms of contraception. Do not father a child while taking this medication and for 3 months after the last dose. Use a condom while having sex during this time period. Do not breastfeed while taking this medication. This medication may cause infertility. Talk to your care team if you are concerned about your fertility. What side effects may I notice from receiving this medication? Side effects that you should report to your care team as soon as possible: Allergic reactions--skin rash, itching, hives, swelling of the face, lips, tongue, or throat Heart attack--pain or tightness in the chest, shoulders, arms, or jaw, nausea, shortness of breath, cold or clammy skin, feeling faint or lightheaded Heart failure--shortness of breath, swelling of the ankles, feet, or hands, sudden weight gain, unusual weakness or fatigue Heart rhythm changes--fast or irregular heartbeat, dizziness, feeling faint or lightheaded, chest pain, trouble breathing High ammonia level--unusual weakness or  fatigue, confusion, loss of appetite, nausea, vomiting, seizures Infection--fever, chills, cough, sore throat, wounds that don't heal, pain or trouble when passing urine, general feeling of discomfort or being unwell Low red blood cell level--unusual weakness or fatigue, dizziness, headache, trouble breathing Pain, tingling, or numbness in the hands or feet, muscle weakness, change in vision, confusion or trouble speaking, loss of balance or coordination, trouble walking, seizures Redness, swelling, and blistering of the skin over hands and feet Severe or prolonged diarrhea Unusual bruising or bleeding  Side effects that usually do not require medical attention (report to your care team if they continue or are bothersome): Dry skin Headache Increased tears Nausea Pain, redness, or swelling with sores inside the mouth or throat Sensitivity to light Vomiting This list may not describe all possible side effects. Call your doctor for medical advice about side effects. You may report side effects to FDA at 1-800-FDA-1088. Where should I keep my medication? This medication is given in a hospital or clinic. It will not be stored at home. NOTE: This sheet is a summary. It may not cover all possible information. If you have questions about this medicine, talk to your doctor, pharmacist, or health care provider.  2023 Elsevier/Gold Standard (2022-03-02 00:00:00)
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
## 2022-11-25 NOTE — Progress Notes (Signed)
Per Cira Rue NP, ok for treatment with ANC 1.3 K/uL

## 2022-11-27 ENCOUNTER — Inpatient Hospital Stay: Payer: Medicare Other

## 2022-11-27 VITALS — BP 145/62 | HR 90 | Temp 97.6°F | Resp 18

## 2022-11-27 DIAGNOSIS — C7989 Secondary malignant neoplasm of other specified sites: Secondary | ICD-10-CM | POA: Diagnosis not present

## 2022-11-27 DIAGNOSIS — C250 Malignant neoplasm of head of pancreas: Secondary | ICD-10-CM | POA: Diagnosis not present

## 2022-11-27 DIAGNOSIS — Z5111 Encounter for antineoplastic chemotherapy: Secondary | ICD-10-CM | POA: Diagnosis not present

## 2022-11-27 DIAGNOSIS — Z5189 Encounter for other specified aftercare: Secondary | ICD-10-CM | POA: Diagnosis not present

## 2022-11-27 DIAGNOSIS — C259 Malignant neoplasm of pancreas, unspecified: Secondary | ICD-10-CM

## 2022-11-27 MED ORDER — SODIUM CHLORIDE 0.9% FLUSH
10.0000 mL | INTRAVENOUS | Status: DC | PRN
  Administered 2022-11-27: 10 mL

## 2022-11-27 MED ORDER — HEPARIN SOD (PORK) LOCK FLUSH 100 UNIT/ML IV SOLN
500.0000 [IU] | Freq: Once | INTRAVENOUS | Status: AC | PRN
  Administered 2022-11-27: 500 [IU]

## 2022-11-27 MED ORDER — PEGFILGRASTIM-CBQV 6 MG/0.6ML ~~LOC~~ SOSY
6.0000 mg | PREFILLED_SYRINGE | Freq: Once | SUBCUTANEOUS | Status: AC
  Administered 2022-11-27: 6 mg via SUBCUTANEOUS

## 2022-12-01 ENCOUNTER — Ambulatory Visit (INDEPENDENT_AMBULATORY_CARE_PROVIDER_SITE_OTHER): Payer: Medicare Other

## 2022-12-01 VITALS — Ht 68.0 in | Wt 220.7 lb

## 2022-12-01 DIAGNOSIS — Z0000 Encounter for general adult medical examination without abnormal findings: Secondary | ICD-10-CM | POA: Diagnosis not present
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
## 2022-12-01 NOTE — Patient Instructions (Signed)
Mr. [name] , Thank you for taking time to come for your Medicare Wellness Visit. I appreciate your ongoing commitment to your health goals. Please review the following plan we discussed and let me know if I can assist you in the future.   These are the goals we discussed:  Goals      Manage My Medicine     Timeframe:  Long-Range Goal Priority:  High Start Date:       03/20/21                      Expected End Date:    02/26/2023               Follow Up Date 02/26/2023   - call for medicine refill 2 or 3 days before it runs out - call if I am sick and can't take my medicine - keep a list of all the medicines I take; vitamins and herbals too - use a pillbox to sort medicine     Why is this important?   These steps will help you keep on track with your medicines.   Notes:         This is a list of the screening recommended for you and due dates:  Health Maintenance  Topic Date Due   COVID-19 Vaccine (5 - 2023-24 season) 07/16/2022   Medicare Annual Wellness Visit  12/02/2023   DTaP/Tdap/Td vaccine (3 - Td or Tdap) 03/24/2025   Colon Cancer Screening  06/26/2029   Pneumonia Vaccine  Completed   Flu Shot  Completed   Hepatitis C Screening: USPSTF Recommendation to screen - Ages 18-79 yo.  Completed   Zoster (Shingles) Vaccine  Completed   HPV Vaccine  Aged Out    Advanced directives: Yes  Conditions/risks identified: Yes  Next appointment: Follow up in one year for your annual wellness visit.   Preventive Care 14 Years and Older, Male  Preventive care refers to lifestyle choices and visits with your health care provider that can promote health and wellness. What does preventive care include? A yearly physical exam. This is also called an annual well check. Dental exams once or twice a year. Routine eye exams. Ask your health care provider how often you should have your eyes checked. Personal lifestyle choices, including: Daily care of your teeth and gums. Regular  physical activity. Eating a healthy diet. Avoiding tobacco and drug use. Limiting alcohol use. Practicing safe sex. Taking low doses of aspirin every day. Taking vitamin and mineral supplements as recommended by your health care provider. What happens during an annual well check? The services and screenings done by your health care provider during your annual well check will depend on your age, overall health, lifestyle risk factors, and family history of disease. Counseling  Your health care provider may ask you questions about your: Alcohol use. Tobacco use. Drug use. Emotional well-being. Home and relationship well-being. Sexual activity. Eating habits. History of falls. Memory and ability to understand (cognition). Work and work Statistician. Screening  You may have the following tests or measurements: Height, weight, and BMI. Blood pressure. Lipid and cholesterol levels. These may be checked every 5 years, or more frequently if you are over 13 years old. Skin check. Lung cancer screening. You may have this screening every year starting at age 37 if you have a 30-pack-year history of smoking and currently smoke or have quit within the past 15 years. Fecal occult blood test (FOBT) of the stool.  You may have this test every year starting at age 43. Flexible sigmoidoscopy or colonoscopy. You may have a sigmoidoscopy every 5 years or a colonoscopy every 10 years starting at age 18. Prostate cancer screening. Recommendations will vary depending on your family history and other risks. Hepatitis C blood test. Hepatitis B blood test. Sexually transmitted disease (STD) testing. Diabetes screening. This is done by checking your blood sugar (glucose) after you have not eaten for a while (fasting). You may have this done every 1-3 years. Abdominal aortic aneurysm (AAA) screening. You may need this if you are a current or former smoker. Osteoporosis. You may be screened starting at age 26  if you are at high risk. Talk with your health care provider about your test results, treatment options, and if necessary, the need for more tests. Vaccines  Your health care provider may recommend certain vaccines, such as: Influenza vaccine. This is recommended every year. Tetanus, diphtheria, and acellular pertussis (Tdap, Td) vaccine. You may need a Td booster every 10 years. Zoster vaccine. You may need this after age 52. Pneumococcal 13-valent conjugate (PCV13) vaccine. One dose is recommended after age 1. Pneumococcal polysaccharide (PPSV23) vaccine. One dose is recommended after age 42. Talk to your health care provider about which screenings and vaccines you need and how often you need them. This information is not intended to replace advice given to you by your health care provider. Make sure you discuss any questions you have with your health care provider. Document Released: 11/28/2015 Document Revised: 07/21/2016 Document Reviewed: 09/02/2015 Elsevier Interactive Patient Education  2017 [employer] Prevention in the Home Falls can cause injuries. They can happen to people of all ages. There are many things you can do to make your home safe and to help prevent falls. What can I do on the outside of my home? Regularly fix the edges of walkways and driveways and fix any cracks. Remove anything that might make you trip as you walk through a door, such as a raised step or threshold. Trim any bushes or trees on the path to your home. Use bright outdoor lighting. Clear any walking paths of anything that might make someone trip, such as rocks or tools. Regularly check to see if handrails are loose or broken. Make sure that both sides of any steps have handrails. Any raised decks and porches should have guardrails on the edges. Have any leaves, snow, or ice cleared regularly. Use sand or salt on walking paths during winter. Clean up any spills in your garage right away. This  includes oil or grease spills. What can I do in the bathroom? Use night lights. Install grab bars by the toilet and in the tub and shower. Do not use towel bars as grab bars. Use non-skid mats or decals in the tub or shower. If you need to sit down in the shower, use a plastic, non-slip stool. Keep the floor dry. Clean up any water that spills on the floor as soon as it happens. Remove soap buildup in the tub or shower regularly. Attach bath mats securely with double-sided non-slip rug tape. Do not have throw rugs and other things on the floor that can make you trip. What can I do in the bedroom? Use night lights. Make sure that you have a light by your bed that is easy to reach. Do not use any sheets or blankets that are too big for your bed. They should not hang down onto the floor. Have  a firm chair that has side arms. You can use this for support while you get dressed. Do not have throw rugs and other things on the floor that can make you trip. What can I do in the kitchen? Clean up any spills right away. Avoid walking on wet floors. Keep items that you use a lot in easy-to-reach places. If you need to reach something above you, use a strong step stool that has a grab bar. Keep electrical cords out of the way. Do not use floor polish or wax that makes floors slippery. If you must use wax, use non-skid floor wax. Do not have throw rugs and other things on the floor that can make you trip. What can I do with my stairs? Do not leave any items on the stairs. Make sure that there are handrails on both sides of the stairs and use them. Fix handrails that are broken or loose. Make sure that handrails are as long as the stairways. Check any carpeting to make sure that it is firmly attached to the stairs. Fix any carpet that is loose or worn. Avoid having throw rugs at the top or bottom of the stairs. If you do have throw rugs, attach them to the floor with carpet tape. Make sure that you  have a light switch at the top of the stairs and the bottom of the stairs. If you do not have them, ask someone to add them for you. What else can I do to help prevent falls? Wear shoes that: Do not have high heels. Have rubber bottoms. Are comfortable and fit you well. Are closed at the toe. Do not wear sandals. If you use a stepladder: Make sure that it is fully opened. Do not climb a closed stepladder. Make sure that both sides of the stepladder are locked into place. Ask someone to hold it for you, if possible. Clearly mark and make sure that you can see: Any grab bars or handrails. First and last steps. Where the edge of each step is. Use tools that help you move around (mobility aids) if they are needed. These include: Canes. Walkers. Scooters. Crutches. Turn on the lights when you go into a dark area. Replace any light bulbs as soon as they burn out. Set up your furniture so you have a clear path. Avoid moving your furniture around. If any of your floors are uneven, fix them. If there are any pets around you, be aware of where they are. Review your medicines with your doctor. Some medicines can make you feel dizzy. This can increase your chance of falling. Ask your doctor what other things that you can do to help prevent falls. This information is not intended to replace advice given to you by your health care provider. Make sure you discuss any questions you have with your health care provider. Document Released: 08/28/2009 Document Revised: 04/08/2016 Document Reviewed: 12/06/2014 Elsevier Interactive Patient Education  2017 [employer].

## 2022-12-08 DIAGNOSIS — G620 Drug-induced polyneuropathy: Secondary | ICD-10-CM | POA: Insufficient documentation

## 2022-12-08 NOTE — Assessment & Plan Note (Signed)
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 Paulita Fujita showed 1.5 cm in pancreatic head, cytology confirmed adenocarcinoma.  An uncovered metal stent was placed in the common bile duct by Dr. Watt Climes -he completed 3 months neoadjuvant FOLFIRINOX on 06/08/22 - 09/09/22, he tolerated well. -restaging CT AP on 09/02/22 showed similar small amount of abnormal soft tissue adjacent to common bile duct stent (which is appropriately located), otherwise no new lesions or definitive signs of metastatic disease.  -We reviewed his case in GI tumor conference, unfortunately due to the tumor invasion of hepatic artery, our surgeons feel his pancreatic cancer is not resectable.  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  -He met with Dr. Hyman Hopes 10/22/2022 who recommends 2 additional months of neoadjuvant chemotherapy

## 2022-12-08 NOTE — Assessment & Plan Note (Signed)
-  Oxaliplatin has been discontinued as of 09/09/2022 -overall stable  -Patient has declined Neurontin
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
## 2022-12-09 ENCOUNTER — Inpatient Hospital Stay: Payer: Medicare Other

## 2022-12-09 ENCOUNTER — Encounter: Payer: Self-pay | Admitting: Hematology

## 2022-12-09 ENCOUNTER — Inpatient Hospital Stay (HOSPITAL_BASED_OUTPATIENT_CLINIC_OR_DEPARTMENT_OTHER): Payer: Medicare Other | Admitting: Hematology

## 2022-12-09 VITALS — BP 136/74 | HR 73 | Temp 98.7°F | Resp 15 | Ht 68.0 in | Wt 215.3 lb

## 2022-12-09 DIAGNOSIS — T451X5A Adverse effect of antineoplastic and immunosuppressive drugs, initial encounter: Secondary | ICD-10-CM | POA: Diagnosis not present

## 2022-12-09 DIAGNOSIS — G620 Drug-induced polyneuropathy: Secondary | ICD-10-CM | POA: Diagnosis not present

## 2022-12-09 DIAGNOSIS — Z5189 Encounter for other specified aftercare: Secondary | ICD-10-CM | POA: Diagnosis not present

## 2022-12-09 DIAGNOSIS — Z5111 Encounter for antineoplastic chemotherapy: Secondary | ICD-10-CM | POA: Diagnosis not present

## 2022-12-09 DIAGNOSIS — C259 Malignant neoplasm of pancreas, unspecified: Secondary | ICD-10-CM | POA: Diagnosis not present

## 2022-12-09 DIAGNOSIS — C7989 Secondary malignant neoplasm of other specified sites: Secondary | ICD-10-CM | POA: Diagnosis not present

## 2022-12-09 DIAGNOSIS — C250 Malignant neoplasm of head of pancreas: Secondary | ICD-10-CM | POA: Diagnosis not present

## 2022-12-09 DIAGNOSIS — Z95828 Presence of other vascular implants and grafts: Secondary | ICD-10-CM

## 2022-12-09 LAB — CBC WITH DIFFERENTIAL/PLATELET
Abs Immature Granulocytes: 0.49 10*3/uL — ABNORMAL HIGH (ref 0.00–0.07)
Basophils Absolute: 0.1 10*3/uL (ref 0.0–0.1)
Basophils Relative: 1 %
Eosinophils Absolute: 0.2 10*3/uL (ref 0.0–0.5)
Eosinophils Relative: 3 %
HCT: 35 % — ABNORMAL LOW (ref 39.0–52.0)
Hemoglobin: 12.1 g/dL — ABNORMAL LOW (ref 13.0–17.0)
Immature Granulocytes: 6 %
Lymphocytes Relative: 12 %
Lymphs Abs: 1 10*3/uL (ref 0.7–4.0)
MCH: 30.4 pg (ref 26.0–34.0)
MCHC: 34.6 g/dL (ref 30.0–36.0)
MCV: 87.9 fL (ref 80.0–100.0)
Monocytes Absolute: 1 10*3/uL (ref 0.1–1.0)
Monocytes Relative: 12 %
Neutro Abs: 5.6 10*3/uL (ref 1.7–7.7)
Neutrophils Relative %: 66 %
Platelets: 168 10*3/uL (ref 150–400)
RBC: 3.98 MIL/uL — ABNORMAL LOW (ref 4.22–5.81)
RDW: 15.7 % — ABNORMAL HIGH (ref 11.5–15.5)
WBC: 8.4 10*3/uL (ref 4.0–10.5)
nRBC: 0 % (ref 0.0–0.2)

## 2022-12-09 LAB — CMP (CANCER CENTER ONLY)
ALT: 22 U/L (ref 0–44)
AST: 27 U/L (ref 15–41)
Albumin: 3.9 g/dL (ref 3.5–5.0)
Alkaline Phosphatase: 144 U/L — ABNORMAL HIGH (ref 38–126)
Anion gap: 7 (ref 5–15)
BUN: 12 mg/dL (ref 8–23)
CO2: 26 mmol/L (ref 22–32)
Calcium: 9.4 mg/dL (ref 8.9–10.3)
Chloride: 104 mmol/L (ref 98–111)
Creatinine: 1.1 mg/dL (ref 0.61–1.24)
GFR, Estimated: >60 mL/min (ref >60)
Glucose, Bld: 104 mg/dL — ABNORMAL HIGH (ref 70–99)
Potassium: 3.9 mmol/L (ref 3.5–5.1)
Sodium: 137 mmol/L (ref 135–145)
Total Bilirubin: 0.4 mg/dL (ref 0.3–1.2)
Total Protein: 6.6 g/dL (ref 6.5–8.1)

## 2022-12-09 MED ORDER — SODIUM CHLORIDE 0.9 % IV SOLN
70.0000 mg/m2 | Freq: Once | INTRAVENOUS | Status: AC
  Administered 2022-12-09: 154.8 mg via INTRAVENOUS
  Filled 2022-12-09: qty 36

## 2022-12-09 MED ORDER — PALONOSETRON HCL INJECTION 0.25 MG/5ML
0.2500 mg | Freq: Once | INTRAVENOUS | Status: AC
  Administered 2022-12-09: 0.25 mg via INTRAVENOUS
  Filled 2022-12-09: qty 5

## 2022-12-09 MED ORDER — SODIUM CHLORIDE 0.9% FLUSH
10.0000 mL | Freq: Once | INTRAVENOUS | Status: AC
  Administered 2022-12-09: 10 mL

## 2022-12-09 MED ORDER — SODIUM CHLORIDE 0.9 % IV SOLN
400.0000 mg/m2 | Freq: Once | INTRAVENOUS | Status: AC
  Administered 2022-12-09: 880 mg via INTRAVENOUS
  Filled 2022-12-09: qty 44

## 2022-12-09 MED ORDER — ACETAMINOPHEN 325 MG PO TABS
650.0000 mg | ORAL_TABLET | Freq: Four times a day (QID) | ORAL | Status: DC | PRN
  Administered 2022-12-09: 650 mg via ORAL
  Filled 2022-12-09: qty 2

## 2022-12-09 MED ORDER — SODIUM CHLORIDE 0.9 % IV SOLN
10.0000 mg | Freq: Once | INTRAVENOUS | Status: AC
  Administered 2022-12-09: 10 mg via INTRAVENOUS
  Filled 2022-12-09: qty 10

## 2022-12-09 MED ORDER — HEPARIN SOD (PORK) LOCK FLUSH 100 UNIT/ML IV SOLN: 500.0000 [IU] | Freq: Once | INTRAVENOUS | Status: DC | PRN

## 2022-12-09 MED ORDER — SODIUM CHLORIDE 0.9 % IV SOLN: Freq: Once | INTRAVENOUS | Status: AC

## 2022-12-09 MED ORDER — ATROPINE SULFATE 1 MG/ML IV SOLN
0.5000 mg | Freq: Once | INTRAVENOUS | Status: AC | PRN
  Administered 2022-12-09: 0.5 mg via INTRAVENOUS
  Filled 2022-12-09: qty 1

## 2022-12-09 MED ORDER — SODIUM CHLORIDE 0.9% FLUSH: 10.0000 mL | INTRAVENOUS | Status: DC | PRN

## 2022-12-09 MED ORDER — SODIUM CHLORIDE 0.9 % IV SOLN
5000.0000 mg | INTRAVENOUS | Status: DC
  Administered 2022-12-09: 5000 mg via INTRAVENOUS
  Filled 2022-12-09: qty 100

## 2022-12-09 NOTE — Progress Notes (Signed)
Per Dr. Burr Medico, ok to run 5FU pump for 44hrs.

## 2022-12-09 NOTE — Patient Instructions (Signed)
[facility]  Discharge Instructions: Thank you for choosing [facility] to provide your oncology and hematology care.   If you have a lab appointment with the [facility], please go directly to the [facility] and check in at the registration area.   Wear comfortable clothing and clothing appropriate for easy access to any Portacath or PICC line.   We strive to give you quality time with your provider. You may need to reschedule your appointment if you arrive late (15 or more minutes).  Arriving late affects you and other patients whose appointments are after yours.  Also, if you miss three or more appointments without notifying the office, you may be dismissed from the clinic at the provider's discretion.      For prescription refill requests, have your pharmacy contact our office and allow 72 hours for refills to be completed.    Today you received the following chemotherapy and/or immunotherapy agents; Fluorouracil, Irinotecan, & Leucovorin      To help prevent nausea and vomiting after your treatment, we encourage you to take your nausea medication as directed.  BELOW ARE SYMPTOMS THAT SHOULD BE REPORTED IMMEDIATELY: *FEVER GREATER THAN 100.4 F (38 C) OR HIGHER *CHILLS OR SWEATING *NAUSEA AND VOMITING THAT IS NOT CONTROLLED WITH YOUR NAUSEA MEDICATION *UNUSUAL SHORTNESS OF BREATH *UNUSUAL BRUISING OR BLEEDING *URINARY PROBLEMS (pain or burning when urinating, or frequent urination) *BOWEL PROBLEMS (unusual diarrhea, constipation, pain near the anus) TENDERNESS IN MOUTH AND THROAT WITH OR WITHOUT PRESENCE OF ULCERS (sore throat, sores in mouth, or a toothache) UNUSUAL RASH, SWELLING OR PAIN  UNUSUAL VAGINAL DISCHARGE OR ITCHING   Items with * indicate a potential emergency and should be followed up as soon as possible or go to the Emergency Department if any problems should occur.  Please show the CHEMOTHERAPY ALERT CARD or  IMMUNOTHERAPY ALERT CARD at check-in to the Emergency Department and triage nurse.  Should you have questions after your visit or need to cancel or reschedule your appointment, please contact [facility]  Dept: [phone]  and follow the prompts.  Office hours are 8:00 a.m. to 4:30 p.m. Monday - Friday. Please note that voicemails left after 4:00 p.m. may not be returned until the following business day.  We are closed weekends and major holidays. You have access to a nurse at all times for urgent questions. Please call the main number to the clinic Dept: [phone] and follow the prompts.   For any non-urgent questions, you may also contact your provider using MyChart. We now offer e-Visits for anyone 31 and older to request care online for non-urgent symptoms. For details visit [email or web address].   Also download the MyChart app! Go to the app store, search "MyChart", open the app, select [facility], and log in with your MyChart username and password.

## 2022-12-10 ENCOUNTER — Telehealth: Payer: Self-pay | Admitting: Hematology

## 2022-12-10 NOTE — Telephone Encounter (Signed)
Patient aware of upcoming appointment  

## 2022-12-10 NOTE — Telephone Encounter (Signed)
Patient called to r/s appointments for later times due to having to care for spouse. R/s patient and notified.

## 2022-12-11 ENCOUNTER — Emergency Department (HOSPITAL_COMMUNITY)
Admission: EM | Admit: 2022-12-11 | Discharge: 2022-12-11 | Disposition: A | Discharge status: Home / Self Care | Payer: Medicare Other | Attending: Emergency Medicine | Admitting: Emergency Medicine

## 2022-12-11 ENCOUNTER — Emergency Department (HOSPITAL_COMMUNITY): Payer: Medicare Other

## 2022-12-11 ENCOUNTER — Inpatient Hospital Stay: Payer: Medicare Other

## 2022-12-11 ENCOUNTER — Encounter (HOSPITAL_COMMUNITY): Payer: Self-pay

## 2022-12-11 VITALS — BP 144/60 | HR 83 | Temp 97.7°F | Resp 17

## 2022-12-11 DIAGNOSIS — W458XXA Other foreign body or object entering through skin, initial encounter: Secondary | ICD-10-CM | POA: Insufficient documentation

## 2022-12-11 DIAGNOSIS — C259 Malignant neoplasm of pancreas, unspecified: Secondary | ICD-10-CM | POA: Diagnosis not present

## 2022-12-11 DIAGNOSIS — S99921A Unspecified injury of right foot, initial encounter: Secondary | ICD-10-CM | POA: Diagnosis not present

## 2022-12-11 DIAGNOSIS — T148XXA Other injury of unspecified body region, initial encounter: Secondary | ICD-10-CM

## 2022-12-11 DIAGNOSIS — S91331A Puncture wound without foreign body, right foot, initial encounter: Secondary | ICD-10-CM | POA: Diagnosis not present

## 2022-12-11 MED ORDER — PEGFILGRASTIM-CBQV 6 MG/0.6ML ~~LOC~~ SOSY
6.0000 mg | PREFILLED_SYRINGE | Freq: Once | SUBCUTANEOUS | Status: AC
  Administered 2022-12-11: 6 mg via SUBCUTANEOUS

## 2022-12-11 MED ORDER — DOXYCYCLINE HYCLATE 100 MG PO CAPS: 100.0000 mg | ORAL_CAPSULE | Freq: Two times a day (BID) | ORAL | 0 refills | Status: DC

## 2022-12-11 MED ORDER — HEPARIN SOD (PORK) LOCK FLUSH 100 UNIT/ML IV SOLN
500.0000 [IU] | Freq: Once | INTRAVENOUS | Status: AC | PRN
  Administered 2022-12-11: 500 [IU]

## 2022-12-11 MED ORDER — SODIUM CHLORIDE 0.9% FLUSH
10.0000 mL | INTRAVENOUS | Status: DC | PRN
  Administered 2022-12-11: 10 mL

## 2022-12-11 NOTE — Discharge Instructions (Addendum)
Discussed you should start soaking her foot twice a day and warm water.  You may also use Epsom salt.  This may bring it to ahead.  Take the antibiotic as we talked about.  If you have worsening pain swelling redness heat or fever please return for further evaluation

## 2022-12-11 NOTE — ED Triage Notes (Signed)
Pt arrived via POV, c/o toothpick piece stuck in right foot.

## 2022-12-11 NOTE — ED Provider Notes (Signed)
[place] AT [facility] Provider Note   CSN: [id number] Arrival date & time: 12/11/22  1044     History  Chief Complaint  Patient presents with   Foreign Body in Skin    [name] is a 77 y.o. male [facility] emergency department for chief complaint of puncture wound of the foot.  Patient states that he stepped barefoot onto a toothpick few days ago.  He feels like he fully removed the toothpick however has noticed increasing pain and swelling in the region.  He is concerned he may have retained foreign body.  He has been taking Tylenol which helps with the pain.  He is currently under treatment for pancreatic cancer.  And  HPI     Home Medications Prior to Admission medications   Medication Sig Start Date End Date Taking? Authorizing Provider  ACCU-CHEK GUIDE test strip USE TO check blood sugar DAILY AS DIRECTED Patient not taking: Reported on 11/25/2022 08/14/20   [name], MD  acetaminophen (TYLENOL) 650 MG CR tablet Take 1,300 mg by mouth every 8 (eight) hours as needed for pain.    [provider]  albuterol (VENTOLIN HFA) 108 (90 Base) MCG/ACT inhaler Inhale 2 puffs into the lungs every 6 (six) hours as needed for wheezing or shortness of breath. Inhale 2 puffs in the lungs every 4 hours as needed for cough, wheezing, SOB. 08/25/22   [name], MD  doxycycline (VIBRAMYCIN) 100 MG capsule Take 1 capsule (100 mg total) by mouth 2 (two) times daily. One po bid x 7 days 12/11/22   Margarita Mail, PA-C  finasteride (PROSCAR) 5 MG tablet Take 5 mg by mouth daily. 07/08/22   [provider]  Lancets MISC Test blood sugar daily. Dx code: 64.00 Patient not taking: Reported on 11/25/2022 05/17/14   Collene Leyden, PA-C  lidocaine-prilocaine (EMLA) cream Apply 1 Application topically as needed. 06/01/22   Heilingoetter, Cassandra L, PA-C  Multiple Vitamin (MULTIVITAMIN) capsule Take 1 capsule by mouth daily.    [provider]  ondansetron (ZOFRAN) 8 MG tablet Take 1 tablet (8 mg total) by mouth every 8 (eight) hours as needed for nausea or vomiting. Starting 3 days after chemotherapy if needed Patient not taking: Reported on 10/13/2022 06/01/22   Heilingoetter, Cassandra L, PA-C  prochlorperazine (COMPAZINE) 10 MG tablet Take 1 tablet (10 mg total) by mouth every 6 (six) hours as needed. 06/01/22   Heilingoetter, Cassandra L, PA-C  Propylene Glycol (SYSTANE COMPLETE OP) Place 1 drop into both eyes as needed (irritation).    [provider]  silodosin (RAPAFLO) 8 MG CAPS capsule Take 8 mg by mouth daily. 07/08/22   [provider]  simvastatin (ZOCOR) 20 MG tablet TAKE ONE TABLET BY MOUTH EVERYDAY AT BEDTIME 07/23/22   [name], MD      Allergies    Patient has no known allergies.    Review of Systems   Review of Systems  Physical Exam Updated Vital Signs BP 133/69 (BP Location: Right Arm)   Pulse 69   Temp 98.4 F (36.9 C) (Oral)   Resp 17   SpO2 100%  Physical Exam Vitals and nursing note reviewed.  Constitutional:      General: He is not in acute distress.    Appearance: He is well-developed. He is not diaphoretic.  HENT:     Head: Normocephalic and atraumatic.  Eyes:     General: No scleral icterus.  Conjunctiva/sclera: Conjunctivae normal.  Cardiovascular:     Rate and Rhythm: Normal rate and regular rhythm.     Heart sounds: Normal heart sounds.  Pulmonary:     Effort: Pulmonary effort is normal. No respiratory distress.     Breath sounds: Normal breath sounds.  Abdominal:     Palpations: Abdomen is soft.     Tenderness: There is no abdominal tenderness.  Musculoskeletal:     Cervical back: Normal range of motion and neck supple.     Comments: Right foot exam performed.  There is a small area of swelling and tenderness in the right heel, no obvious fluctuance or erythema  Skin:    General: Skin is warm and dry.  Neurological:     Mental Status: He is  alert.  Psychiatric:        Behavior: Behavior normal.     ED Results / Procedures / Treatments   Labs (all labs ordered are listed, but only abnormal results are displayed) Labs Reviewed - No data to display  EKG None  Radiology DG Foot Complete Right  Result Date: 12/11/2022 CLINICAL DATA:  Stepped on toothpick.  Concern for foreign body EXAM: RIGHT FOOT COMPLETE - 3+ VIEW COMPARISON:  None Available. FINDINGS: There is no evidence of fracture or dislocation. Mild osteoarthritic changes. No focal soft tissue swelling. No radiopaque foreign body identified. IMPRESSION: No radiopaque foreign body identified. If there is persistent clinical concern for foreign body, consider ultrasound. Electronically Signed   By: Davina Poke D.O.   On: 12/11/2022 11:44    Procedures Ultrasound ED Soft Tissue  Date/Time: 12/11/2022 4:39 PM  Performed by: Margarita Mail, PA-C Authorized by: Margarita Mail, PA-C   Procedure details:    Indications: evaluate for foreign body     Transverse view:  Visualized   Longitudinal view:  Visualized   Images: archived   Location:    Location: lower extremity     Location comment:  R heel   Side:  Right Findings:     abscess present Comments:     Patient appears to have a minuscule collection of fluid in the right heel.  There is a little bit of surrounding inflammatory changes suggestive of cellulitis      Medications Ordered in ED Medications - No data to display  ED Course/ Medical Decision Making/ A&P                             Medical Decision Making Patient here for evaluation for complaint o off  foreign body wound to foot.  It appears he has a very small abscess however it does not appear large enough to be I&D.  I have visualized the area with bedside ultrasound and archived with the footage.  Will discharge with doxycycline.  Encourage warm foot soaks twice daily and I have discussed outpatient follow-up    Risk Prescription  drug management.   [place] She       Final Clinical Impression(s) / ED Diagnoses Final diagnoses:  Puncture wound    Rx / DC Orders ED Discharge Orders          Ordered    doxycycline (VIBRAMYCIN) 100 MG capsule  2 times daily,   Status:  Discontinued        12/11/22 1205    doxycycline (VIBRAMYCIN) 100 MG capsule  2 times daily        12/11/22 1205  Margarita Mail, PA-C 12/11/22 1645    Gareth Morgan, MD 12/11/22 2314

## 2022-12-11 NOTE — ED Provider Triage Note (Signed)
Emergency Medicine Provider Triage Evaluation Note  [name] , a 77 y.o. male  was evaluated in triage.  Pt complains of foreign body to right foot onset 9 days.  Notes that he accidentally stepped on a toothpick.  He notes that he thinks he removed all of the toothpick however he is unsure.  Denies history of diabetes.  Is currently being treated for pancreatic cancer.  Notes he has an appointment today at the cancer center.  Denies fever or drainage.  Review of Systems  Positive:  Negative:   Physical Exam  BP 136/85 (BP Location: Right Arm)   Pulse 75   Temp 98.4 F (36.9 C) (Oral)   Resp 18   SpO2 100%  Gen:   Awake, no distress   Resp:  Normal effort  MSK:   Moves extremities without difficulty  Other:  Tenderness to palpation noted to right heel without overlying skin changes or appreciable drainage, fluctuance, induration noted.  Medical Decision Making  Medically screening exam initiated at 11:16 AM.  Appropriate orders placed.  AVORY MIMBS was informed that the remainder of the evaluation will be completed by another provider, this initial triage assessment does not replace that evaluation, and the importance of remaining in the ED until their evaluation is complete.  Workup initiated   Delane Wessinger A, PA-C 12/11/22 1118
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
## 2022-12-21 MED FILL — Dexamethasone Sodium Phosphate Inj 100 MG/10ML: INTRAMUSCULAR | Qty: 1 | Status: AC

## 2022-12-22 ENCOUNTER — Ambulatory Visit: Payer: Medicare Other

## 2022-12-22 ENCOUNTER — Other Ambulatory Visit: Payer: Medicare Other

## 2022-12-22 ENCOUNTER — Telehealth: Payer: Self-pay | Admitting: *Deleted

## 2022-12-22 ENCOUNTER — Inpatient Hospital Stay: Payer: Medicare Other

## 2022-12-22 ENCOUNTER — Encounter: Payer: Self-pay | Admitting: Nurse Practitioner

## 2022-12-22 ENCOUNTER — Inpatient Hospital Stay: Payer: Medicare Other | Attending: Hematology

## 2022-12-22 ENCOUNTER — Other Ambulatory Visit: Payer: Self-pay

## 2022-12-22 ENCOUNTER — Inpatient Hospital Stay: Payer: Medicare Other | Admitting: Nurse Practitioner

## 2022-12-22 ENCOUNTER — Ambulatory Visit: Payer: Medicare Other | Admitting: Hematology

## 2022-12-22 VITALS — BP 147/71 | HR 72 | Temp 98.6°F | Resp 16 | Ht 68.0 in | Wt 213.7 lb

## 2022-12-22 DIAGNOSIS — C259 Malignant neoplasm of pancreas, unspecified: Secondary | ICD-10-CM

## 2022-12-22 DIAGNOSIS — Z95828 Presence of other vascular implants and grafts: Secondary | ICD-10-CM

## 2022-12-22 DIAGNOSIS — C250 Malignant neoplasm of head of pancreas: Secondary | ICD-10-CM | POA: Insufficient documentation

## 2022-12-22 DIAGNOSIS — Z5189 Encounter for other specified aftercare: Secondary | ICD-10-CM | POA: Insufficient documentation

## 2022-12-22 DIAGNOSIS — Z5111 Encounter for antineoplastic chemotherapy: Secondary | ICD-10-CM | POA: Diagnosis not present

## 2022-12-22 DIAGNOSIS — G620 Drug-induced polyneuropathy: Secondary | ICD-10-CM

## 2022-12-22 DIAGNOSIS — R7401 Elevation of levels of liver transaminase levels: Secondary | ICD-10-CM | POA: Diagnosis not present

## 2022-12-22 DIAGNOSIS — T451X5A Adverse effect of antineoplastic and immunosuppressive drugs, initial encounter: Secondary | ICD-10-CM | POA: Diagnosis not present

## 2022-12-22 LAB — CBC WITH DIFFERENTIAL (CANCER CENTER ONLY)
Abs Immature Granulocytes: 0.91 10*3/uL — ABNORMAL HIGH (ref 0.00–0.07)
Basophils Absolute: 0.1 10*3/uL (ref 0.0–0.1)
Basophils Relative: 1 %
Eosinophils Absolute: 0.3 10*3/uL (ref 0.0–0.5)
Eosinophils Relative: 3 %
HCT: 33.5 % — ABNORMAL LOW (ref 39.0–52.0)
Hemoglobin: 11.7 g/dL — ABNORMAL LOW (ref 13.0–17.0)
Immature Granulocytes: 11 %
Lymphocytes Relative: 12 %
Lymphs Abs: 1 10*3/uL (ref 0.7–4.0)
MCH: 31.4 pg (ref 26.0–34.0)
MCHC: 34.9 g/dL (ref 30.0–36.0)
MCV: 89.8 fL (ref 80.0–100.0)
Monocytes Absolute: 0.8 10*3/uL (ref 0.1–1.0)
Monocytes Relative: 10 %
Neutro Abs: 5.1 10*3/uL (ref 1.7–7.7)
Neutrophils Relative %: 63 %
Platelet Count: 161 10*3/uL (ref 150–400)
RBC: 3.73 MIL/uL — ABNORMAL LOW (ref 4.22–5.81)
RDW: 16.1 % — ABNORMAL HIGH (ref 11.5–15.5)
WBC Count: 8.1 10*3/uL (ref 4.0–10.5)
nRBC: 0.2 % (ref 0.0–0.2)

## 2022-12-22 LAB — CMP (CANCER CENTER ONLY)
ALT: 28 U/L (ref 0–44)
AST: 41 U/L (ref 15–41)
Albumin: 3.8 g/dL (ref 3.5–5.0)
Alkaline Phosphatase: 150 U/L — ABNORMAL HIGH (ref 38–126)
Anion gap: 7 (ref 5–15)
BUN: 11 mg/dL (ref 8–23)
CO2: 25 mmol/L (ref 22–32)
Calcium: 9.2 mg/dL (ref 8.9–10.3)
Chloride: 107 mmol/L (ref 98–111)
Creatinine: 0.99 mg/dL (ref 0.61–1.24)
GFR, Estimated: >60 mL/min (ref >60)
Glucose, Bld: 114 mg/dL — ABNORMAL HIGH (ref 70–99)
Potassium: 3.6 mmol/L (ref 3.5–5.1)
Sodium: 139 mmol/L (ref 135–145)
Total Bilirubin: 0.4 mg/dL (ref 0.3–1.2)
Total Protein: 5.9 g/dL — ABNORMAL LOW (ref 6.5–8.1)

## 2022-12-22 MED ORDER — SODIUM CHLORIDE 0.9 % IV SOLN
10.0000 mg | Freq: Once | INTRAVENOUS | Status: AC
  Administered 2022-12-22: 10 mg via INTRAVENOUS
  Filled 2022-12-22: qty 10

## 2022-12-22 MED ORDER — SODIUM CHLORIDE 0.9 % IV SOLN: Freq: Once | INTRAVENOUS | Status: AC

## 2022-12-22 MED ORDER — SODIUM CHLORIDE 0.9 % IV SOLN
400.0000 mg/m2 | Freq: Once | INTRAVENOUS | Status: AC
  Administered 2022-12-22: 880 mg via INTRAVENOUS
  Filled 2022-12-22: qty 44

## 2022-12-22 MED ORDER — SODIUM CHLORIDE 0.9 % IV SOLN
2400.0000 mg/m2 | INTRAVENOUS | Status: DC
  Administered 2022-12-22: 5000 mg via INTRAVENOUS
  Filled 2022-12-22: qty 100

## 2022-12-22 MED ORDER — PALONOSETRON HCL INJECTION 0.25 MG/5ML
0.2500 mg | Freq: Once | INTRAVENOUS | Status: AC
  Administered 2022-12-22: 0.25 mg via INTRAVENOUS
  Filled 2022-12-22: qty 5

## 2022-12-22 MED ORDER — SODIUM CHLORIDE 0.9% FLUSH: 10.0000 mL | INTRAVENOUS | Status: DC | PRN

## 2022-12-22 MED ORDER — SODIUM CHLORIDE 0.9% FLUSH
10.0000 mL | Freq: Once | INTRAVENOUS | Status: AC
  Administered 2022-12-22: 10 mL

## 2022-12-22 MED ORDER — SODIUM CHLORIDE 0.9 % IV SOLN
70.0000 mg/m2 | Freq: Once | INTRAVENOUS | Status: AC
  Administered 2022-12-22: 154.8 mg via INTRAVENOUS
  Filled 2022-12-22: qty 36

## 2022-12-22 MED ORDER — HEPARIN SOD (PORK) LOCK FLUSH 100 UNIT/ML IV SOLN: 500.0000 [IU] | Freq: Once | INTRAVENOUS | Status: DC | PRN

## 2022-12-22 NOTE — Patient Instructions (Signed)
[facility]  Discharge Instructions: Thank you for choosing [facility] to provide your oncology and hematology care.   If you have a lab appointment with the [facility], please go directly to the [facility] and check in at the registration area.   Wear comfortable clothing and clothing appropriate for easy access to any Portacath or PICC line.   We strive to give you quality time with your provider. You may need to reschedule your appointment if you arrive late (15 or more minutes).  Arriving late affects you and other patients whose appointments are after yours.  Also, if you miss three or more appointments without notifying the office, you may be dismissed from the clinic at the provider's discretion.      For prescription refill requests, have your pharmacy contact our office and allow 72 hours for refills to be completed.    Today you received the following chemotherapy and/or immunotherapy agents: Liposomal Irinotecan, Leucovorin, Fluorouracil.       To help prevent nausea and vomiting after your treatment, we encourage you to take your nausea medication as directed.  BELOW ARE SYMPTOMS THAT SHOULD BE REPORTED IMMEDIATELY: *FEVER GREATER THAN 100.4 F (38 C) OR HIGHER *CHILLS OR SWEATING *NAUSEA AND VOMITING THAT IS NOT CONTROLLED WITH YOUR NAUSEA MEDICATION *UNUSUAL SHORTNESS OF BREATH *UNUSUAL BRUISING OR BLEEDING *URINARY PROBLEMS (pain or burning when urinating, or frequent urination) *BOWEL PROBLEMS (unusual diarrhea, constipation, pain near the anus) TENDERNESS IN MOUTH AND THROAT WITH OR WITHOUT PRESENCE OF ULCERS (sore throat, sores in mouth, or a toothache) UNUSUAL RASH, SWELLING OR PAIN  UNUSUAL VAGINAL DISCHARGE OR ITCHING   Items with * indicate a potential emergency and should be followed up as soon as possible or go to the Emergency Department if any problems should occur.  Please show the CHEMOTHERAPY ALERT  CARD or IMMUNOTHERAPY ALERT CARD at check-in to the Emergency Department and triage nurse.  Should you have questions after your visit or need to cancel or reschedule your appointment, please contact [facility]  Dept: [phone]  and follow the prompts.  Office hours are 8:00 a.m. to 4:30 p.m. Monday - Friday. Please note that voicemails left after 4:00 p.m. may not be returned until the following business day.  We are closed weekends and major holidays. You have access to a nurse at all times for urgent questions. Please call the main number to the clinic Dept: [phone] and follow the prompts.   For any non-urgent questions, you may also contact your provider using MyChart. We now offer e-Visits for anyone 52 and older to request care online for non-urgent symptoms. For details visit [email or web address].   Also download the MyChart app! Go to the app store, search "MyChart", open the app, select [facility], and log in with your MyChart username and password.

## 2022-12-22 NOTE — Telephone Encounter (Signed)
        Patient  visited [facility] on 12/11/2022  for treatment   Telephone encounter attempt :  1sr  A HIPAA compliant voice message was left requesting a return call.  Instructed patient to call back at [phone]. Marland Kitchenj

## 2022-12-23 ENCOUNTER — Ambulatory Visit: Payer: Medicare Other

## 2022-12-23 ENCOUNTER — Other Ambulatory Visit: Payer: Medicare Other

## 2022-12-23 ENCOUNTER — Ambulatory Visit: Payer: Medicare Other | Admitting: Nurse Practitioner

## 2022-12-24 ENCOUNTER — Inpatient Hospital Stay: Payer: Medicare Other

## 2022-12-24 VITALS — BP 138/61 | HR 82 | Temp 98.7°F | Resp 18

## 2022-12-24 DIAGNOSIS — Z5111 Encounter for antineoplastic chemotherapy: Secondary | ICD-10-CM | POA: Diagnosis not present

## 2022-12-24 DIAGNOSIS — C259 Malignant neoplasm of pancreas, unspecified: Secondary | ICD-10-CM

## 2022-12-24 DIAGNOSIS — R7401 Elevation of levels of liver transaminase levels: Secondary | ICD-10-CM | POA: Diagnosis not present

## 2022-12-24 DIAGNOSIS — C250 Malignant neoplasm of head of pancreas: Secondary | ICD-10-CM | POA: Diagnosis not present

## 2022-12-24 DIAGNOSIS — Z5189 Encounter for other specified aftercare: Secondary | ICD-10-CM | POA: Diagnosis not present

## 2022-12-24 MED ORDER — PEGFILGRASTIM-CBQV 6 MG/0.6ML ~~LOC~~ SOSY
6.0000 mg | PREFILLED_SYRINGE | Freq: Once | SUBCUTANEOUS | Status: AC
  Administered 2022-12-24: 6 mg via SUBCUTANEOUS
  Filled 2022-12-24: qty 0.6

## 2022-12-24 MED ORDER — SODIUM CHLORIDE 0.9% FLUSH
10.0000 mL | INTRAVENOUS | Status: DC | PRN
  Administered 2022-12-24: 10 mL

## 2022-12-24 MED ORDER — HEPARIN SOD (PORK) LOCK FLUSH 100 UNIT/ML IV SOLN
500.0000 [IU] | Freq: Once | INTRAVENOUS | Status: AC | PRN
  Administered 2022-12-24: 500 [IU]

## 2022-12-24 NOTE — Patient Instructions (Signed)

## 2022-12-24 NOTE — Progress Notes (Signed)
When pt arrived for PUMP D/C appointment, port dressing was not intact. 1/2 of port dressing was hanging loose and biopatch was not present around port site. Pt stated he did not know how long port dressing had been in that state. RN flushed and deaccessed port, detatching pt from pump. Port site did not appear to be swollen, irritated or leaking. RN instructed pt to monitor port site for signs of infection and call care team if necessary. Pt expressed understanding. RN instructed pt to monitor port dressing in the future to make sure it stays intact during home infusion. Pt expressed understanding.

## 2022-12-27 ENCOUNTER — Other Ambulatory Visit: Payer: Self-pay | Admitting: Hematology

## 2022-12-27 DIAGNOSIS — C259 Malignant neoplasm of pancreas, unspecified: Secondary | ICD-10-CM

## 2022-12-29 ENCOUNTER — Telehealth: Payer: Self-pay | Admitting: Hematology

## 2022-12-29 NOTE — Telephone Encounter (Signed)
Contacted patient to scheduled appointments. Patient is aware of appointments that are scheduled.   

## 2022-12-31 DIAGNOSIS — C250 Malignant neoplasm of head of pancreas: Secondary | ICD-10-CM | POA: Diagnosis not present

## 2023-01-05 MED FILL — Dexamethasone Sodium Phosphate Inj 100 MG/10ML: INTRAMUSCULAR | Qty: 1 | Status: AC
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
## 2023-01-05 NOTE — Assessment & Plan Note (Signed)
-  Oxaliplatin has been discontinued as of 09/09/2022 -overall stable  -Patient has declined Neurontin

## 2023-01-05 NOTE — Assessment & Plan Note (Signed)
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 Paulita Fujita showed 1.5 cm in pancreatic head, cytology confirmed adenocarcinoma.  An uncovered metal stent was placed in the common bile duct by Dr. Watt Climes -he completed 3 months neoadjuvant FOLFIRINOX on 06/08/22 - 09/09/22, he tolerated well. Chemo changed to FOLFIRI with liposomal irinotecan afterward  -restaging CT AP on 09/02/22 showed similar small amount of abnormal soft tissue adjacent to common bile duct stent (which is appropriately located), otherwise no new lesions or definitive signs of metastatic disease.  -We reviewed his case in GI tumor conference, unfortunately due to the tumor invasion of hepatic artery, our surgeons feel his pancreatic cancer is not resectable.  -His recent restaging CT scan at [facility] showed persistent tumor invasion of hepatic artery, Dr. Hyman Hopes recommend SBRT

## 2023-01-06 ENCOUNTER — Other Ambulatory Visit: Payer: Self-pay

## 2023-01-06 ENCOUNTER — Inpatient Hospital Stay (HOSPITAL_BASED_OUTPATIENT_CLINIC_OR_DEPARTMENT_OTHER): Payer: Medicare Other | Admitting: Hematology

## 2023-01-06 ENCOUNTER — Inpatient Hospital Stay: Payer: Medicare Other

## 2023-01-06 ENCOUNTER — Encounter: Payer: Self-pay | Admitting: Hematology

## 2023-01-06 VITALS — BP 108/66 | HR 96 | Temp 98.1°F | Resp 17 | Wt 203.4 lb

## 2023-01-06 DIAGNOSIS — T451X5A Adverse effect of antineoplastic and immunosuppressive drugs, initial encounter: Secondary | ICD-10-CM

## 2023-01-06 DIAGNOSIS — C259 Malignant neoplasm of pancreas, unspecified: Secondary | ICD-10-CM

## 2023-01-06 DIAGNOSIS — Z5111 Encounter for antineoplastic chemotherapy: Secondary | ICD-10-CM | POA: Diagnosis not present

## 2023-01-06 DIAGNOSIS — G620 Drug-induced polyneuropathy: Secondary | ICD-10-CM

## 2023-01-06 DIAGNOSIS — Z95828 Presence of other vascular implants and grafts: Secondary | ICD-10-CM

## 2023-01-06 DIAGNOSIS — C250 Malignant neoplasm of head of pancreas: Secondary | ICD-10-CM | POA: Diagnosis not present

## 2023-01-06 DIAGNOSIS — R7401 Elevation of levels of liver transaminase levels: Secondary | ICD-10-CM | POA: Diagnosis not present

## 2023-01-06 DIAGNOSIS — Z5189 Encounter for other specified aftercare: Secondary | ICD-10-CM | POA: Diagnosis not present

## 2023-01-06 LAB — CBC WITH DIFFERENTIAL (CANCER CENTER ONLY)
Abs Immature Granulocytes: 0.43 10*3/uL — ABNORMAL HIGH (ref 0.00–0.07)
Basophils Absolute: 0.1 10*3/uL (ref 0.0–0.1)
Basophils Relative: 1 %
Eosinophils Absolute: 0.2 10*3/uL (ref 0.0–0.5)
Eosinophils Relative: 2 %
HCT: 32.7 % — ABNORMAL LOW (ref 39.0–52.0)
Hemoglobin: 11.7 g/dL — ABNORMAL LOW (ref 13.0–17.0)
Immature Granulocytes: 4 %
Lymphocytes Relative: 4 %
Lymphs Abs: 0.5 10*3/uL — ABNORMAL LOW (ref 0.7–4.0)
MCH: 30.8 pg (ref 26.0–34.0)
MCHC: 35.8 g/dL (ref 30.0–36.0)
MCV: 86.1 fL (ref 80.0–100.0)
Monocytes Absolute: 1.1 10*3/uL — ABNORMAL HIGH (ref 0.1–1.0)
Monocytes Relative: 9 %
Neutro Abs: 9.7 10*3/uL — ABNORMAL HIGH (ref 1.7–7.7)
Neutrophils Relative %: 80 %
Platelet Count: 163 10*3/uL (ref 150–400)
RBC: 3.8 MIL/uL — ABNORMAL LOW (ref 4.22–5.81)
RDW: 16 % — ABNORMAL HIGH (ref 11.5–15.5)
WBC Count: 12 10*3/uL — ABNORMAL HIGH (ref 4.0–10.5)
nRBC: 0 % (ref 0.0–0.2)

## 2023-01-06 LAB — CMP (CANCER CENTER ONLY)
ALT: 234 U/L — ABNORMAL HIGH (ref 0–44)
AST: 170 U/L — ABNORMAL HIGH (ref 15–41)
Albumin: 3.8 g/dL (ref 3.5–5.0)
Alkaline Phosphatase: 390 U/L — ABNORMAL HIGH (ref 38–126)
Anion gap: 9 (ref 5–15)
BUN: 22 mg/dL (ref 8–23)
CO2: 24 mmol/L (ref 22–32)
Calcium: 9.2 mg/dL (ref 8.9–10.3)
Chloride: 100 mmol/L (ref 98–111)
Creatinine: 1.31 mg/dL — ABNORMAL HIGH (ref 0.61–1.24)
GFR, Estimated: 56 mL/min — ABNORMAL LOW (ref >60)
Glucose, Bld: 113 mg/dL — ABNORMAL HIGH (ref 70–99)
Potassium: 3.4 mmol/L — ABNORMAL LOW (ref 3.5–5.1)
Sodium: 133 mmol/L — ABNORMAL LOW (ref 135–145)
Total Bilirubin: 2.9 mg/dL — ABNORMAL HIGH (ref 0.3–1.2)
Total Protein: 6.5 g/dL (ref 6.5–8.1)

## 2023-01-06 MED ORDER — SODIUM CHLORIDE 0.9 % IV SOLN: INTRAVENOUS | Status: DC

## 2023-01-06 MED ORDER — SODIUM CHLORIDE 0.9% FLUSH
10.0000 mL | Freq: Once | INTRAVENOUS | Status: AC
  Administered 2023-01-06: 10 mL

## 2023-01-06 NOTE — Patient Instructions (Signed)

## 2023-01-07 ENCOUNTER — Encounter: Payer: Self-pay | Admitting: Hematology

## 2023-01-08 ENCOUNTER — Inpatient Hospital Stay: Payer: Medicare Other

## 2023-01-13 ENCOUNTER — Other Ambulatory Visit: Payer: Self-pay

## 2023-01-13 ENCOUNTER — Encounter: Payer: Self-pay | Admitting: Nurse Practitioner

## 2023-01-13 ENCOUNTER — Inpatient Hospital Stay (HOSPITAL_BASED_OUTPATIENT_CLINIC_OR_DEPARTMENT_OTHER): Payer: Medicare Other | Admitting: Nurse Practitioner

## 2023-01-13 ENCOUNTER — Inpatient Hospital Stay: Payer: Medicare Other

## 2023-01-13 DIAGNOSIS — Z95828 Presence of other vascular implants and grafts: Secondary | ICD-10-CM

## 2023-01-13 DIAGNOSIS — C259 Malignant neoplasm of pancreas, unspecified: Secondary | ICD-10-CM | POA: Diagnosis not present

## 2023-01-13 DIAGNOSIS — Z5111 Encounter for antineoplastic chemotherapy: Secondary | ICD-10-CM | POA: Diagnosis not present

## 2023-01-13 DIAGNOSIS — C250 Malignant neoplasm of head of pancreas: Secondary | ICD-10-CM | POA: Diagnosis not present

## 2023-01-13 DIAGNOSIS — Z5189 Encounter for other specified aftercare: Secondary | ICD-10-CM | POA: Diagnosis not present

## 2023-01-13 DIAGNOSIS — R7401 Elevation of levels of liver transaminase levels: Secondary | ICD-10-CM | POA: Diagnosis not present

## 2023-01-13 LAB — CBC WITH DIFFERENTIAL (CANCER CENTER ONLY)
Abs Immature Granulocytes: 0.1 10*3/uL — ABNORMAL HIGH (ref 0.00–0.07)
Basophils Absolute: 0.1 10*3/uL (ref 0.0–0.1)
Basophils Relative: 1 %
Eosinophils Absolute: 0.5 10*3/uL (ref 0.0–0.5)
Eosinophils Relative: 10 %
HCT: 30.1 % — ABNORMAL LOW (ref 39.0–52.0)
Hemoglobin: 10.4 g/dL — ABNORMAL LOW (ref 13.0–17.0)
Immature Granulocytes: 2 %
Lymphocytes Relative: 11 %
Lymphs Abs: 0.6 10*3/uL — ABNORMAL LOW (ref 0.7–4.0)
MCH: 30.8 pg (ref 26.0–34.0)
MCHC: 34.6 g/dL (ref 30.0–36.0)
MCV: 89.1 fL (ref 80.0–100.0)
Monocytes Absolute: 1 10*3/uL (ref 0.1–1.0)
Monocytes Relative: 18 %
Neutro Abs: 3.2 10*3/uL (ref 1.7–7.7)
Neutrophils Relative %: 58 %
Platelet Count: 203 10*3/uL (ref 150–400)
RBC: 3.38 MIL/uL — ABNORMAL LOW (ref 4.22–5.81)
RDW: 15.9 % — ABNORMAL HIGH (ref 11.5–15.5)
WBC Count: 5.4 10*3/uL (ref 4.0–10.5)
nRBC: 0 % (ref 0.0–0.2)

## 2023-01-13 LAB — CMP (CANCER CENTER ONLY)
ALT: 36 U/L (ref 0–44)
AST: 25 U/L (ref 15–41)
Albumin: 3.4 g/dL — ABNORMAL LOW (ref 3.5–5.0)
Alkaline Phosphatase: 271 U/L — ABNORMAL HIGH (ref 38–126)
Anion gap: 6 (ref 5–15)
BUN: 11 mg/dL (ref 8–23)
CO2: 28 mmol/L (ref 22–32)
Calcium: 8.4 mg/dL — ABNORMAL LOW (ref 8.9–10.3)
Chloride: 102 mmol/L (ref 98–111)
Creatinine: 0.81 mg/dL (ref 0.61–1.24)
GFR, Estimated: >60 mL/min (ref >60)
Glucose, Bld: 94 mg/dL (ref 70–99)
Potassium: 4.2 mmol/L (ref 3.5–5.1)
Sodium: 136 mmol/L (ref 135–145)
Total Bilirubin: 0.9 mg/dL (ref 0.3–1.2)
Total Protein: 5.7 g/dL — ABNORMAL LOW (ref 6.5–8.1)

## 2023-01-13 MED ORDER — HEPARIN SOD (PORK) LOCK FLUSH 100 UNIT/ML IV SOLN: 500.0000 [IU] | Freq: Once | INTRAVENOUS | Status: DC

## 2023-01-13 MED ORDER — SODIUM CHLORIDE 0.9% FLUSH
10.0000 mL | Freq: Once | INTRAVENOUS | Status: AC
  Administered 2023-01-13: 10 mL
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
## 2023-01-14 ENCOUNTER — Ambulatory Visit: Payer: Medicare Other

## 2023-01-19 DIAGNOSIS — C250 Malignant neoplasm of head of pancreas: Secondary | ICD-10-CM | POA: Diagnosis not present

## 2023-01-19 DIAGNOSIS — C259 Malignant neoplasm of pancreas, unspecified: Secondary | ICD-10-CM | POA: Diagnosis not present

## 2023-01-20 ENCOUNTER — Other Ambulatory Visit: Payer: Medicare Other

## 2023-01-20 ENCOUNTER — Ambulatory Visit: Payer: Medicare Other | Admitting: Hematology

## 2023-01-20 ENCOUNTER — Ambulatory Visit: Payer: Medicare Other

## 2023-02-15 DIAGNOSIS — J45909 Unspecified asthma, uncomplicated: Secondary | ICD-10-CM | POA: Diagnosis not present

## 2023-02-15 DIAGNOSIS — K8689 Other specified diseases of pancreas: Secondary | ICD-10-CM | POA: Diagnosis not present

## 2023-02-15 DIAGNOSIS — Z8507 Personal history of malignant neoplasm of pancreas: Secondary | ICD-10-CM | POA: Diagnosis not present

## 2023-02-15 DIAGNOSIS — Z9689 Presence of other specified functional implants: Secondary | ICD-10-CM | POA: Diagnosis not present

## 2023-02-15 DIAGNOSIS — R935 Abnormal findings on diagnostic imaging of other abdominal regions, including retroperitoneum: Secondary | ICD-10-CM | POA: Diagnosis not present

## 2023-02-15 DIAGNOSIS — Z79899 Other long term (current) drug therapy: Secondary | ICD-10-CM | POA: Diagnosis not present

## 2023-02-15 DIAGNOSIS — C250 Malignant neoplasm of head of pancreas: Secondary | ICD-10-CM | POA: Diagnosis not present

## 2023-02-15 DIAGNOSIS — Z87891 Personal history of nicotine dependence: Secondary | ICD-10-CM | POA: Diagnosis not present

## 2023-02-15 DIAGNOSIS — Z4659 Encounter for fitting and adjustment of other gastrointestinal appliance and device: Secondary | ICD-10-CM | POA: Diagnosis not present

## 2023-02-16 ENCOUNTER — Other Ambulatory Visit: Payer: Self-pay

## 2023-02-17 ENCOUNTER — Encounter: Payer: Self-pay | Admitting: Nurse Practitioner

## 2023-02-17 ENCOUNTER — Inpatient Hospital Stay: Payer: Medicare Other

## 2023-02-17 ENCOUNTER — Inpatient Hospital Stay: Payer: Medicare Other | Attending: Hematology | Admitting: Nurse Practitioner

## 2023-02-17 DIAGNOSIS — C259 Malignant neoplasm of pancreas, unspecified: Secondary | ICD-10-CM
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
## 2023-02-18 ENCOUNTER — Telehealth: Payer: Self-pay | Admitting: Hematology

## 2023-02-19 ENCOUNTER — Other Ambulatory Visit: Payer: Self-pay

## 2023-02-23 DIAGNOSIS — C250 Malignant neoplasm of head of pancreas: Secondary | ICD-10-CM | POA: Diagnosis not present

## 2023-02-25 ENCOUNTER — Ambulatory Visit: Payer: Medicare Other | Admitting: Internal Medicine

## 2023-02-28 ENCOUNTER — Encounter: Payer: Self-pay | Admitting: Internal Medicine

## 2023-02-28 DIAGNOSIS — C250 Malignant neoplasm of head of pancreas: Secondary | ICD-10-CM | POA: Diagnosis not present

## 2023-02-28 NOTE — Progress Notes (Signed)
      Subjective:    Patient ID: [name], male    DOB: 1946-08-20, 77 y.o.   MRN: [id number]     HPI [name] is here for follow up of his chronic medical problems.  Pancreatic ca - s/p chemo, going to [facility] for radiation and surgery.   Has neuropathy secondary to chemo.      Medications and allergies reviewed with patient and updated if appropriate.  Current Outpatient Medications on File Prior to Visit  Medication Sig Dispense Refill  . ACCU-CHEK GUIDE test strip USE TO check blood sugar DAILY AS DIRECTED 100 strip 3  . acetaminophen (TYLENOL) 650 MG CR tablet Take 1,300 mg by mouth as needed for pain.    Marland Kitchen albuterol (VENTOLIN HFA) 108 (90 Base) MCG/ACT inhaler Inhale 2 puffs into the lungs every 6 (six) hours as needed for wheezing or shortness of breath. Inhale 2 puffs in the lungs every 4 hours as needed for cough, wheezing, SOB. (Patient taking differently: Inhale 2 puffs into the lungs every 6 (six) hours as needed for wheezing or shortness of breath.) 18 g 11  . finasteride (PROSCAR) 5 MG tablet Take 5 mg by mouth daily.    . Lancets MISC Test blood sugar daily. Dx code: 250.00 100 each 3  . lidocaine-prilocaine (EMLA) cream Apply 1 Application topically as needed. (Patient taking differently: Apply 1 Application topically as needed (port access).) 30 g 2  . prochlorperazine (COMPAZINE) 10 MG tablet Take 1 tablet (10 mg total) by mouth every 6 (six) hours as needed. 30 tablet 2  . silodosin (RAPAFLO) 8 MG CAPS capsule Take 8 mg by mouth daily.     No current facility-administered medications on file prior to visit.     Review of Systems     Objective:  There were no vitals filed for this visit. BP Readings from Last 3 Encounters:  05/18/23 124/80  05/13/23 126/78  03/31/23 134/68   Wt Readings from Last 3 Encounters:  05/18/23 199 lb (90.3 kg)  05/13/23 210 lb (95.3 kg)  03/31/23 207 lb (93.9 kg)   There is no height or weight on file to calculate  BMI.    Physical Exam     Lab Results  Component Value Date   WBC 4.3 03/30/2023   HGB 11.1 (L) 03/30/2023   HCT 32.4 (L) 03/30/2023   PLT 112 (L) 03/30/2023   GLUCOSE 122 (H) 03/30/2023   CHOL 119 02/04/2022   TRIG 77.0 02/04/2022   HDL 40.20 02/04/2022   LDLCALC 64 02/04/2022   ALT 38 03/30/2023   AST 47 (H) 03/30/2023   NA 139 03/30/2023   K 3.9 03/30/2023   CL 105 03/30/2023   CREATININE 0.95 03/30/2023   BUN 14 03/30/2023   CO2 29 03/30/2023   TSH 3.00 07/31/2019   PSA 1.09 07/22/2017   INR 0.9 06/03/2022   HGBA1C 5.7 (H) 06/03/2022   MICROALBUR 0.3 11/25/2015     Assessment & Plan:    See Problem List for Assessment and Plan of chronic medical problems.    This encounter was created in error - please disregard.

## 2023-02-28 NOTE — Patient Instructions (Addendum)
      Blood work was ordered.   The lab is on the first floor.    Medications changes include :       A referral was ordered for XXX.     Someone will call you to schedule an appointment.    Return in about 6 months (around 08/31/2023) for follow up.

## 2023-03-01 ENCOUNTER — Encounter: Payer: Medicare Other | Admitting: Internal Medicine

## 2023-03-01 DIAGNOSIS — N1831 Chronic kidney disease, stage 3a: Secondary | ICD-10-CM

## 2023-03-01 DIAGNOSIS — E785 Hyperlipidemia, unspecified: Secondary | ICD-10-CM

## 2023-03-01 DIAGNOSIS — I10 Essential (primary) hypertension: Secondary | ICD-10-CM

## 2023-03-01 DIAGNOSIS — K219 Gastro-esophageal reflux disease without esophagitis: Secondary | ICD-10-CM

## 2023-03-01 DIAGNOSIS — C250 Malignant neoplasm of head of pancreas: Secondary | ICD-10-CM | POA: Diagnosis not present

## 2023-03-01 DIAGNOSIS — J4520 Mild intermittent asthma, uncomplicated: Secondary | ICD-10-CM

## 2023-03-01 DIAGNOSIS — R7303 Prediabetes: Secondary | ICD-10-CM

## 2023-03-01 NOTE — Assessment & Plan Note (Signed)
Chronic Blood pressure well controlled Medication was discontinued-blood pressure currently controlled-monitor off medication 

## 2023-03-01 NOTE — Assessment & Plan Note (Signed)
Chronic Regular exercise and healthy diet encouraged Continue simvastatin 20 mg daily Check CMP, lipid panel

## 2023-03-01 NOTE — Assessment & Plan Note (Signed)
Chronic Check A1c Continue diabetic diet, regular exercise encouraged

## 2023-03-01 NOTE — Assessment & Plan Note (Signed)
Chronic Mild, intermittent Continue albuterol inhaler as needed 

## 2023-03-02 DIAGNOSIS — C250 Malignant neoplasm of head of pancreas: Secondary | ICD-10-CM | POA: Diagnosis not present

## 2023-03-03 DIAGNOSIS — C250 Malignant neoplasm of head of pancreas: Secondary | ICD-10-CM | POA: Diagnosis not present

## 2023-03-04 DIAGNOSIS — C250 Malignant neoplasm of head of pancreas: Secondary | ICD-10-CM | POA: Diagnosis not present
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
## 2023-03-16 ENCOUNTER — Inpatient Hospital Stay: Payer: Medicare Other

## 2023-03-16 ENCOUNTER — Inpatient Hospital Stay: Payer: Medicare Other | Attending: Hematology | Admitting: Hematology

## 2023-03-16 ENCOUNTER — Inpatient Hospital Stay (HOSPITAL_COMMUNITY)
Admission: AD | Admit: 2023-03-16 | Discharge: 2023-03-19 | DRG: 919 | Disposition: A | Discharge status: Home / Self Care | Payer: Medicare Other | Source: Ambulatory Visit | Attending: Internal Medicine | Admitting: Internal Medicine

## 2023-03-16 ENCOUNTER — Encounter (HOSPITAL_COMMUNITY): Payer: Self-pay | Admitting: Internal Medicine

## 2023-03-16 ENCOUNTER — Other Ambulatory Visit: Payer: Self-pay

## 2023-03-16 ENCOUNTER — Encounter (HOSPITAL_COMMUNITY): Payer: Self-pay

## 2023-03-16 ENCOUNTER — Encounter: Payer: Self-pay | Admitting: Hematology

## 2023-03-16 ENCOUNTER — Inpatient Hospital Stay (HOSPITAL_COMMUNITY): Payer: Medicare Other

## 2023-03-16 VITALS — BP 107/56 | HR 93 | Temp 98.8°F | Resp 20 | Ht 68.0 in | Wt 206.8 lb

## 2023-03-16 DIAGNOSIS — C259 Malignant neoplasm of pancreas, unspecified: Secondary | ICD-10-CM

## 2023-03-16 DIAGNOSIS — Z8249 Family history of ischemic heart disease and other diseases of the circulatory system: Secondary | ICD-10-CM | POA: Diagnosis not present

## 2023-03-16 DIAGNOSIS — C250 Malignant neoplasm of head of pancreas: Secondary | ICD-10-CM | POA: Diagnosis present

## 2023-03-16 DIAGNOSIS — E785 Hyperlipidemia, unspecified: Secondary | ICD-10-CM | POA: Diagnosis not present

## 2023-03-16 DIAGNOSIS — K8309 Other cholangitis: Secondary | ICD-10-CM | POA: Diagnosis not present

## 2023-03-16 DIAGNOSIS — K3189 Other diseases of stomach and duodenum: Secondary | ICD-10-CM | POA: Diagnosis not present

## 2023-03-16 DIAGNOSIS — R262 Difficulty in walking, not elsewhere classified: Secondary | ICD-10-CM | POA: Diagnosis not present

## 2023-03-16 DIAGNOSIS — Z6831 Body mass index (BMI) 31.0-31.9, adult: Secondary | ICD-10-CM

## 2023-03-16 DIAGNOSIS — Z923 Personal history of irradiation: Secondary | ICD-10-CM

## 2023-03-16 DIAGNOSIS — E669 Obesity, unspecified: Secondary | ICD-10-CM | POA: Diagnosis present

## 2023-03-16 DIAGNOSIS — E119 Type 2 diabetes mellitus without complications: Secondary | ICD-10-CM | POA: Diagnosis not present

## 2023-03-16 DIAGNOSIS — Z9181 History of falling: Secondary | ICD-10-CM

## 2023-03-16 DIAGNOSIS — Z833 Family history of diabetes mellitus: Secondary | ICD-10-CM

## 2023-03-16 DIAGNOSIS — D630 Anemia in neoplastic disease: Secondary | ICD-10-CM | POA: Diagnosis present

## 2023-03-16 DIAGNOSIS — N400 Enlarged prostate without lower urinary tract symptoms: Secondary | ICD-10-CM | POA: Diagnosis present

## 2023-03-16 DIAGNOSIS — Z823 Family history of stroke: Secondary | ICD-10-CM | POA: Diagnosis not present

## 2023-03-16 DIAGNOSIS — R932 Abnormal findings on diagnostic imaging of liver and biliary tract: Secondary | ICD-10-CM | POA: Diagnosis not present

## 2023-03-16 DIAGNOSIS — Y732 Prosthetic and other implants, materials and accessory gastroenterology and urology devices associated with adverse incidents: Secondary | ICD-10-CM | POA: Diagnosis present

## 2023-03-16 DIAGNOSIS — G620 Drug-induced polyneuropathy: Secondary | ICD-10-CM | POA: Diagnosis not present

## 2023-03-16 DIAGNOSIS — Z95828 Presence of other vascular implants and grafts: Secondary | ICD-10-CM

## 2023-03-16 DIAGNOSIS — Z79899 Other long term (current) drug therapy: Secondary | ICD-10-CM | POA: Diagnosis not present

## 2023-03-16 DIAGNOSIS — K831 Obstruction of bile duct: Secondary | ICD-10-CM | POA: Diagnosis present

## 2023-03-16 DIAGNOSIS — Z9221 Personal history of antineoplastic chemotherapy: Secondary | ICD-10-CM | POA: Diagnosis not present

## 2023-03-16 DIAGNOSIS — R748 Abnormal levels of other serum enzymes: Secondary | ICD-10-CM | POA: Diagnosis not present

## 2023-03-16 DIAGNOSIS — I10 Essential (primary) hypertension: Secondary | ICD-10-CM | POA: Diagnosis not present

## 2023-03-16 DIAGNOSIS — Z87891 Personal history of nicotine dependence: Secondary | ICD-10-CM

## 2023-03-16 DIAGNOSIS — T451X5A Adverse effect of antineoplastic and immunosuppressive drugs, initial encounter: Secondary | ICD-10-CM | POA: Diagnosis present

## 2023-03-16 DIAGNOSIS — R933 Abnormal findings on diagnostic imaging of other parts of digestive tract: Secondary | ICD-10-CM | POA: Diagnosis not present

## 2023-03-16 DIAGNOSIS — K838 Other specified diseases of biliary tract: Secondary | ICD-10-CM | POA: Diagnosis not present

## 2023-03-16 DIAGNOSIS — K8689 Other specified diseases of pancreas: Secondary | ICD-10-CM | POA: Diagnosis not present

## 2023-03-16 DIAGNOSIS — R7989 Other specified abnormal findings of blood chemistry: Secondary | ICD-10-CM | POA: Diagnosis present

## 2023-03-16 DIAGNOSIS — Z96652 Presence of left artificial knee joint: Secondary | ICD-10-CM | POA: Diagnosis not present

## 2023-03-16 DIAGNOSIS — J45909 Unspecified asthma, uncomplicated: Secondary | ICD-10-CM | POA: Diagnosis present

## 2023-03-16 DIAGNOSIS — R945 Abnormal results of liver function studies: Secondary | ICD-10-CM | POA: Diagnosis not present

## 2023-03-16 DIAGNOSIS — K219 Gastro-esophageal reflux disease without esophagitis: Secondary | ICD-10-CM | POA: Diagnosis not present

## 2023-03-16 DIAGNOSIS — R7401 Elevation of levels of liver transaminase levels: Secondary | ICD-10-CM | POA: Diagnosis not present

## 2023-03-16 DIAGNOSIS — T85520A Displacement of bile duct prosthesis, initial encounter: Principal | ICD-10-CM | POA: Diagnosis present

## 2023-03-16 LAB — CBC WITH DIFFERENTIAL (CANCER CENTER ONLY)
Abs Immature Granulocytes: 0.04 10*3/uL (ref 0.00–0.07)
Basophils Absolute: 0 10*3/uL (ref 0.0–0.1)
Basophils Relative: 0 %
Eosinophils Absolute: 0 10*3/uL (ref 0.0–0.5)
Eosinophils Relative: 0 %
HCT: 33.3 % — ABNORMAL LOW (ref 39.0–52.0)
Hemoglobin: 11.2 g/dL — ABNORMAL LOW (ref 13.0–17.0)
Immature Granulocytes: 1 %
Lymphocytes Relative: 2 %
Lymphs Abs: 0.1 10*3/uL — ABNORMAL LOW (ref 0.7–4.0)
MCH: 29.6 pg (ref 26.0–34.0)
MCHC: 33.6 g/dL (ref 30.0–36.0)
MCV: 88.1 fL (ref 80.0–100.0)
Monocytes Absolute: 0.6 10*3/uL (ref 0.1–1.0)
Monocytes Relative: 8 %
Neutro Abs: 7.1 10*3/uL (ref 1.7–7.7)
Neutrophils Relative %: 89 %
Platelet Count: 151 10*3/uL (ref 150–400)
RBC: 3.78 MIL/uL — ABNORMAL LOW (ref 4.22–5.81)
RDW: 13.7 % (ref 11.5–15.5)
WBC Count: 7.9 10*3/uL (ref 4.0–10.5)
nRBC: 0 % (ref 0.0–0.2)

## 2023-03-16 LAB — BILIRUBIN, FRACTIONATED(TOT/DIR/INDIR)
Bilirubin, Direct: 3.7 mg/dL — ABNORMAL HIGH (ref 0.0–0.2)
Indirect Bilirubin: 1.9 mg/dL — ABNORMAL HIGH (ref 0.3–0.9)
Total Bilirubin: 5.6 mg/dL — ABNORMAL HIGH (ref 0.3–1.2)

## 2023-03-16 LAB — URINALYSIS, ROUTINE W REFLEX MICROSCOPIC
Glucose, UA: NEGATIVE mg/dL
Hgb urine dipstick: NEGATIVE
Ketones, ur: NEGATIVE mg/dL
Leukocytes,Ua: NEGATIVE
Nitrite: NEGATIVE
Protein, ur: NEGATIVE mg/dL
Specific Gravity, Urine: 1.012 (ref 1.005–1.030)
pH: 5 (ref 5.0–8.0)

## 2023-03-16 LAB — RETICULOCYTES
Immature Retic Fract: 11 % (ref 2.3–15.9)
RBC.: 3.7 MIL/uL — ABNORMAL LOW (ref 4.22–5.81)
Retic Count, Absolute: 51.1 10*3/uL (ref 19.0–186.0)
Retic Ct Pct: 1.4 % (ref 0.4–3.1)

## 2023-03-16 LAB — FERRITIN: Ferritin: 365 ng/mL — ABNORMAL HIGH (ref 24–336)

## 2023-03-16 LAB — IRON AND TIBC
Iron: 13 ug/dL — ABNORMAL LOW (ref 45–182)
Saturation Ratios: 4 % — ABNORMAL LOW (ref 17.9–39.5)
TIBC: 308 ug/dL (ref 250–450)
UIBC: 295 ug/dL

## 2023-03-16 LAB — CMP (CANCER CENTER ONLY)
ALT: 143 U/L — ABNORMAL HIGH (ref 0–44)
AST: 197 U/L (ref 15–41)
Albumin: 3.5 g/dL (ref 3.5–5.0)
Alkaline Phosphatase: 456 U/L — ABNORMAL HIGH (ref 38–126)
Anion gap: 6 (ref 5–15)
BUN: 11 mg/dL (ref 8–23)
CO2: 27 mmol/L (ref 22–32)
Calcium: 8.8 mg/dL — ABNORMAL LOW (ref 8.9–10.3)
Chloride: 105 mmol/L (ref 98–111)
Creatinine: 0.84 mg/dL (ref 0.61–1.24)
GFR, Estimated: >60 mL/min (ref >60)
Glucose, Bld: 111 mg/dL — ABNORMAL HIGH (ref 70–99)
Potassium: 3.8 mmol/L (ref 3.5–5.1)
Sodium: 138 mmol/L (ref 135–145)
Total Bilirubin: 5.1 mg/dL (ref 0.3–1.2)
Total Protein: 6.3 g/dL — ABNORMAL LOW (ref 6.5–8.1)

## 2023-03-16 LAB — FOLATE: Folate: 13.9 ng/mL (ref >5.9)

## 2023-03-16 LAB — VITAMIN B12: Vitamin B-12: 497 pg/mL (ref 180–914)

## 2023-03-16 MED ORDER — ENOXAPARIN SODIUM 40 MG/0.4ML IJ SOSY
40.0000 mg | PREFILLED_SYRINGE | INTRAMUSCULAR | Status: DC
  Administered 2023-03-16 – 2023-03-18 (×3): 40 mg via SUBCUTANEOUS
  Filled 2023-03-16 (×3): qty 0.4

## 2023-03-16 MED ORDER — ONDANSETRON HCL 4 MG PO TABS: 4.0000 mg | ORAL_TABLET | Freq: Four times a day (QID) | ORAL | Status: DC | PRN

## 2023-03-16 MED ORDER — HYDRALAZINE HCL 20 MG/ML IJ SOLN: 10.0000 mg | INTRAMUSCULAR | Status: DC | PRN

## 2023-03-16 MED ORDER — ONDANSETRON HCL 4 MG/2ML IJ SOLN: 4.0000 mg | Freq: Four times a day (QID) | INTRAMUSCULAR | Status: DC | PRN

## 2023-03-16 MED ORDER — SODIUM CHLORIDE 0.9 % IV SOLN: Freq: Once | INTRAVENOUS | Status: AC

## 2023-03-16 MED ORDER — SODIUM CHLORIDE 0.9 % IV SOLN: Freq: Once | INTRAVENOUS | Status: DC

## 2023-03-16 MED ORDER — FINASTERIDE 5 MG PO TABS
5.0000 mg | ORAL_TABLET | Freq: Every day | ORAL | Status: DC
  Administered 2023-03-17 – 2023-03-19 (×3): 5 mg via ORAL
  Filled 2023-03-16 (×3): qty 1

## 2023-03-16 MED ORDER — HEPARIN SOD (PORK) LOCK FLUSH 100 UNIT/ML IV SOLN
500.0000 [IU] | Freq: Once | INTRAVENOUS | Status: AC
  Administered 2023-03-16: 500 [IU]

## 2023-03-16 MED ORDER — SODIUM CHLORIDE 0.9% FLUSH
10.0000 mL | Freq: Once | INTRAVENOUS | Status: AC
  Administered 2023-03-16: 10 mL

## 2023-03-16 MED ORDER — TAMSULOSIN HCL 0.4 MG PO CAPS
0.4000 mg | ORAL_CAPSULE | Freq: Every day | ORAL | Status: DC
  Administered 2023-03-17 – 2023-03-19 (×3): 0.4 mg via ORAL
  Filled 2023-03-16 (×3): qty 1

## 2023-03-16 MED ORDER — ALBUTEROL SULFATE (2.5 MG/3ML) 0.083% IN NEBU: 2.5000 mg | INHALATION_SOLUTION | Freq: Four times a day (QID) | RESPIRATORY_TRACT | Status: DC | PRN

## 2023-03-16 MED ORDER — IBUPROFEN 200 MG PO TABS
400.0000 mg | ORAL_TABLET | Freq: Four times a day (QID) | ORAL | Status: DC | PRN
  Administered 2023-03-17 – 2023-03-18 (×3): 400 mg via ORAL
  Filled 2023-03-16 (×3): qty 2

## 2023-03-16 NOTE — TOC Progression Note (Signed)
Transition of Care [facility]) - Progression Note    Patient Details  Name: [name] MRN: [id number] Date of Birth: 09/25/1946  Transition of Care [facility]) CM/SW Contact  Beckie Busing, RN Phone Number:[phone]  03/16/2023, 3:43 PM  Clinical Narrative:     Transition of Care [facility]) Screening Note   Patient Details  Name: [name] Date of Birth: [birth year]   Transition of Care [facility]) CM/SW Contact:    Beckie Busing, RN Phone Number: 03/16/2023, 3:43 PM    Transition of Care Department [facility]) has reviewed patient and no TOC needs have been identified at this time. We will continue to monitor patient advancement through interdisciplinary progression rounds. If new patient transition needs arise, please place a TOC consult.          Expected Discharge Plan and Services                                               Social Determinants of Health (SDOH) Interventions SDOH Screenings   Food Insecurity: No Food Insecurity (03/16/2023)  Housing: Low Risk  (03/16/2023)  Transportation Needs: No Transportation Needs (03/16/2023)  Utilities: Not At Risk (03/16/2023)  Alcohol Screen: Low Risk  (12/01/2022)  Depression (PHQ2-9): Low Risk  (12/01/2022)  Financial Resource Strain: Low Risk  (12/01/2022)  Physical Activity: Sufficiently Active (12/01/2022)  Social Connections: Socially Integrated (12/01/2022)  Stress: No Stress Concern Present (12/01/2022)  Tobacco Use: Medium Risk (03/16/2023)    Readmission Risk Interventions     No data to display
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
## 2023-03-16 NOTE — Assessment & Plan Note (Signed)
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 [name] showed 1.5 cm in pancreatic head, cytology confirmed adenocarcinoma.  An uncovered metal stent was placed in the common bile duct by Dr. Ewing Schlein -he completed 3 months neoadjuvant FOLFIRINOX on 06/08/22 - 09/09/22, he tolerated well. Chemo changed to FOLFIRI with liposomal irinotecan afterward  -restaging CT AP on 09/02/22 showed similar small amount of abnormal soft tissue adjacent to common bile duct stent (which is appropriately located), otherwise no new lesions or definitive signs of metastatic disease.  -We reviewed his case in GI tumor conference, unfortunately due to the tumor invasion of hepatic artery, our surgeons feel his pancreatic cancer is not resectable.  -His recent restaging CT scan at [facility] showed persistent tumor invasion of hepatic artery, Dr. [name] recommend SBRT, he completed at [facility]
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
## 2023-03-17 ENCOUNTER — Inpatient Hospital Stay (HOSPITAL_COMMUNITY): Payer: Medicare Other

## 2023-03-17 LAB — COMPREHENSIVE METABOLIC PANEL
ALT: 106 U/L — ABNORMAL HIGH (ref 0–44)
AST: 113 U/L — ABNORMAL HIGH (ref 15–41)
Albumin: 2.8 g/dL — ABNORMAL LOW (ref 3.5–5.0)
Alkaline Phosphatase: 327 U/L — ABNORMAL HIGH (ref 38–126)
Anion gap: 8 (ref 5–15)
BUN: 14 mg/dL (ref 8–23)
CO2: 23 mmol/L (ref 22–32)
Calcium: 8.5 mg/dL — ABNORMAL LOW (ref 8.9–10.3)
Chloride: 106 mmol/L (ref 98–111)
Creatinine, Ser: 0.82 mg/dL (ref 0.61–1.24)
GFR, Estimated: >60 mL/min (ref >60)
Glucose, Bld: 103 mg/dL — ABNORMAL HIGH (ref 70–99)
Potassium: 3.7 mmol/L (ref 3.5–5.1)
Sodium: 137 mmol/L (ref 135–145)
Total Bilirubin: 4.5 mg/dL — ABNORMAL HIGH (ref 0.3–1.2)
Total Protein: 6.1 g/dL — ABNORMAL LOW (ref 6.5–8.1)

## 2023-03-17 LAB — CBC
HCT: 30.7 % — ABNORMAL LOW (ref 39.0–52.0)
Hemoglobin: 9.9 g/dL — ABNORMAL LOW (ref 13.0–17.0)
MCH: 29 pg (ref 26.0–34.0)
MCHC: 32.2 g/dL (ref 30.0–36.0)
MCV: 90 fL (ref 80.0–100.0)
Platelets: 139 10*3/uL — ABNORMAL LOW (ref 150–400)
RBC: 3.41 MIL/uL — ABNORMAL LOW (ref 4.22–5.81)
RDW: 13.7 % (ref 11.5–15.5)
WBC: 3.3 10*3/uL — ABNORMAL LOW (ref 4.0–10.5)
nRBC: 0 % (ref 0.0–0.2)

## 2023-03-17 LAB — CULTURE, BLOOD (ROUTINE X 2)
Special Requests: ADEQUATE
Special Requests: ADEQUATE

## 2023-03-17 MED ORDER — KCL IN DEXTROSE-NACL 20-5-0.45 MEQ/L-%-% IV SOLN: INTRAVENOUS | Status: DC

## 2023-03-17 MED ORDER — PIPERACILLIN-TAZOBACTAM 3.375 G IVPB
3.3750 g | Freq: Three times a day (TID) | INTRAVENOUS | Status: DC
  Administered 2023-03-17 – 2023-03-19 (×6): 3.375 g via INTRAVENOUS
  Filled 2023-03-17 (×6): qty 50

## 2023-03-17 MED ORDER — GADOBUTROL 1 MMOL/ML IV SOLN
9.0000 mL | Freq: Once | INTRAVENOUS | Status: AC | PRN
  Administered 2023-03-17: 9 mL via INTRAVENOUS

## 2023-03-17 MED ORDER — KCL IN DEXTROSE-NACL 20-5-0.45 MEQ/L-%-% IV SOLN
INTRAVENOUS | Status: DC
  Filled 2023-03-17 (×3): qty 1000

## 2023-03-17 MED ORDER — CHLORHEXIDINE GLUCONATE CLOTH 2 % EX PADS
6.0000 | MEDICATED_PAD | Freq: Every day | CUTANEOUS | Status: DC
  Administered 2023-03-17 – 2023-03-19 (×3): 6 via TOPICAL

## 2023-03-17 NOTE — Progress Notes (Signed)
[name]   DOB:1946-06-26   ZO#:[id number]   JWJ#:[id number]  Med/onc follow-up note  Subjective: Patient is well-known to me, under my care for his pancreatic cancer.  He was admitted from my office to [facility], for his newly developed hyperbilirubinemia, fatigue and anorexia.  He has received IV hydration and antibiotics, he is scheduled for ERCP and stent exchange tomorrow by Dr. Ewing Schlein.  He is overall feeling better, he ate well today.   Objective:  Vitals:   03/17/23 0211 03/17/23 1400  BP: (!) 107/58 (!) 107/58  Pulse: 69 69  Resp:    Temp:  98 F (36.7 C)  SpO2: 97% 97%    Body mass index is 31.51 kg/m.  Intake/Output Summary (Last 24 hours) at 03/17/2023 1905 Last data filed at 03/17/2023 1800 Gross per 24 hour  Intake 1448.34 ml  Output --  Net 1448.34 ml     Sclerae icteric  Oropharynx clear  No peripheral adenopathy  Lungs clear -- no rales or rhonchi  Heart regular rate and rhythm  Abdomen benign  MSK no focal spinal tenderness, no peripheral edema  Neuro nonfocal    CBG (last 3)  No results for input(s): "GLUCAP" in the last 72 hours.   Labs:    Urine Studies No results for input(s): "UHGB", "CRYS" in the last 72 hours.  Invalid input(s): "UACOL", "UAPR", "USPG", "UPH", "UTP", "UGL", "UKET", "UBIL", "UNIT", "UROB", "ULEU", "UEPI", "UWBC", "URBC", "UBAC", "CAST", "UCOM", "BILUA"  Basic Metabolic Panel: Recent Labs  Lab 03/16/23 1050 03/17/23 0743  NA 138 137  K 3.8 3.7  CL 105 106  CO2 27 23  GLUCOSE 111* 103*  BUN 11 14  CREATININE 0.84 0.82  CALCIUM 8.8* 8.5*   GFR Estimated Creatinine Clearance: 82.5 mL/min (by C-G formula based on SCr of 0.82 mg/dL). Liver Function Tests: Recent Labs  Lab 03/16/23 1050 03/16/23 1605 03/17/23 0743  AST 197*  --  113*  ALT 143*  --  106*  ALKPHOS 456*  --  327*  BILITOT 5.1* 5.6* 4.5*  PROT 6.3*  --  6.1*  ALBUMIN 3.5  --  2.8*   No results for input(s): "LIPASE", "AMYLASE" in the last 168  hours. No results for input(s): "AMMONIA" in the last 168 hours. Coagulation profile No results for input(s): "INR", "PROTIME" in the last 168 hours.  CBC: Recent Labs  Lab 03/16/23 1050 03/17/23 0743  WBC 7.9 3.3*  NEUTROABS 7.1  --   HGB 11.2* 9.9*  HCT 33.3* 30.7*  MCV 88.1 90.0  PLT 151 139*   Cardiac Enzymes: No results for input(s): "CKTOTAL", "CKMB", "CKMBINDEX", "TROPONINI" in the last 168 hours. BNP: Invalid input(s): "POCBNP" CBG: No results for input(s): "GLUCAP" in the last 168 hours. D-Dimer No results for input(s): "DDIMER" in the last 72 hours. Hgb A1c No results for input(s): "HGBA1C" in the last 72 hours. Lipid Profile No results for input(s): "CHOL", "HDL", "LDLCALC", "TRIG", "CHOLHDL", "LDLDIRECT" in the last 72 hours. Thyroid function studies No results for input(s): "TSH", "T4TOTAL", "T3FREE", "THYROIDAB" in the last 72 hours.  Invalid input(s): "FREET3" Anemia work up Recent Labs    03/16/23 1602 03/16/23 1605  VITAMINB12  --  497  FOLATE 13.9  --   FERRITIN  --  365*  TIBC  --  308  IRON  --  13*  RETICCTPCT  --  1.4   Microbiology Recent Results (from the past 240 hour(s))  Culture, blood (Routine X 2) w Reflex to ID Panel  Status: None (Preliminary result)   Collection Time: 03/16/23  3:46 PM   Specimen: BLOOD  Result Value Ref Range Status   Specimen Description   Final    BLOOD SITE NOT SPECIFIED Performed at [facility], [address], [place], [place] [address]    Special Requests   Final    BOTTLES DRAWN AEROBIC ONLY Blood Culture adequate volume Performed at [facility], [address], [place], [place] [address]    Culture   Final    NO GROWTH < 24 HOURS Performed at [facility], [address] [place], [place] [address]    Report Status PENDING  Incomplete  Culture, blood (Routine X 2) w Reflex to ID Panel     Status: None (Preliminary result)   Collection Time: 03/16/23  3:46 PM    Specimen: BLOOD  Result Value Ref Range Status   Specimen Description   Final    BLOOD SITE NOT SPECIFIED Performed at [facility], [address] [place], [place] [address]    Special Requests   Final    BOTTLES DRAWN AEROBIC ONLY Blood Culture adequate volume Performed at [facility], [address] [place], [place] [address]    Culture   Final    NO GROWTH < 24 HOURS Performed at [facility], [address], [place], [place] [address]    Report Status PENDING  Incomplete      Studies:  MR ABDOMEN MRCP W WO CONTAST  Result Date: 03/17/2023 CLINICAL DATA:  Pancreatic cancer.  Hyperbilirubinemia.  Pain. EXAM: MRI ABDOMEN WITHOUT AND WITH CONTRAST (INCLUDING MRCP) TECHNIQUE: Multiplanar multisequence MR imaging of the abdomen was performed both before and after the administration of intravenous contrast. Heavily T2-weighted images of the biliary and pancreatic ducts were obtained, and three-dimensional MRCP images were rendered by post processing. CONTRAST:  9mL GADAVIST GADOBUTROL 1 MMOL/ML IV SOLN COMPARISON:  Ultrasound 03/16/2023, CT scan 09/02/2022 and prior MRI 05/22/2022 FINDINGS: Lower chest: The lung bases are clear of an acute pulmonary process. Very small right pleural effusion. No pericardial effusion. Hepatobiliary: New intrahepatic biliary dilatation and dilatation of the proximal common bile duct to a maximum of 19 mm. The biliary stent appears to be in good position but is likely occluded. No hepatic lesions to suggest metastatic disease. Mild distention of the gallbladder. No gallstones or findings for acute cholecystitis. Pancreas: No discrete pancreatic mass is identified. Normal caliber and course of the pancreatic duct. Spleen:  Normal size.  No focal lesions. Adrenals/Urinary Tract: The adrenal glands and kidneys are and stable. Stomach/Bowel: Stomach duodenum, visualized small bowel and visualized colon are unremarkable.  Vascular/Lymphatic: The aorta and branch vessels are patent. The major venous structures are patent. Portal and splenic veins are patent. No mesenteric or retroperitoneal lymphadenopathy. Other:  No ascites or omental disease. Musculoskeletal: No significant bony findings. IMPRESSION: 1. New moderate intrahepatic biliary dilatation and dilatation of the proximal common bile duct to a maximum of 19 mm. The biliary stent appears to be in good position but is likely occluded. 2. No discrete pancreatic mass is identified. Normal caliber and course of the pancreatic duct. 3. No hepatic lesions to suggest metastatic disease. Electronically Signed   By: Rudie Meyer M.D.   On: 03/17/2023 08:13   MR 3D Recon At Scanner  Result Date: 03/17/2023 CLINICAL DATA:  Pancreatic cancer.  Hyperbilirubinemia.  Pain. EXAM: MRI ABDOMEN WITHOUT AND WITH CONTRAST (INCLUDING MRCP) TECHNIQUE: Multiplanar  multisequence MR imaging of the abdomen was performed both before and after the administration of intravenous contrast. Heavily T2-weighted images of the biliary and pancreatic ducts were obtained, and three-dimensional MRCP images were rendered by post processing. CONTRAST:  9mL GADAVIST GADOBUTROL 1 MMOL/ML IV SOLN COMPARISON:  Ultrasound 03/16/2023, CT scan 09/02/2022 and prior MRI 05/22/2022 FINDINGS: Lower chest: The lung bases are clear of an acute pulmonary process. Very small right pleural effusion. No pericardial effusion. Hepatobiliary: New intrahepatic biliary dilatation and dilatation of the proximal common bile duct to a maximum of 19 mm. The biliary stent appears to be in good position but is likely occluded. No hepatic lesions to suggest metastatic disease. Mild distention of the gallbladder. No gallstones or findings for acute cholecystitis. Pancreas: No discrete pancreatic mass is identified. Normal caliber and course of the pancreatic duct. Spleen:  Normal size.  No focal lesions. Adrenals/Urinary Tract: The adrenal  glands and kidneys are and stable. Stomach/Bowel: Stomach duodenum, visualized small bowel and visualized colon are unremarkable. Vascular/Lymphatic: The aorta and branch vessels are patent. The major venous structures are patent. Portal and splenic veins are patent. No mesenteric or retroperitoneal lymphadenopathy. Other:  No ascites or omental disease. Musculoskeletal: No significant bony findings. IMPRESSION: 1. New moderate intrahepatic biliary dilatation and dilatation of the proximal common bile duct to a maximum of 19 mm. The biliary stent appears to be in good position but is likely occluded. 2. No discrete pancreatic mass is identified. Normal caliber and course of the pancreatic duct. 3. No hepatic lesions to suggest metastatic disease. Electronically Signed   By: Rudie Meyer M.D.   On: 03/17/2023 08:13   US Abdomen Limited RUQ (LIVER/GB)  Result Date: 03/16/2023 CLINICAL DATA:  Hyperbilirubinemia. EXAM: ULTRASOUND ABDOMEN LIMITED RIGHT UPPER QUADRANT COMPARISON:  None Available. FINDINGS: Gallbladder: No gallstones or wall thickening visualized (3.4 mm). No sonographic Murphy sign noted by sonographer. Common bile duct: Diameter: 11.1 mm Liver: No focal lesion identified. Within normal limits in parenchymal echogenicity. Portal vein is patent on color Doppler imaging with normal direction of blood flow towards the liver. Other: None. IMPRESSION: Dilated common bile duct without visualized obstructing lesions. Further evaluation with MRCP is recommended. Electronically Signed   By: Aram Candela M.D.   On: 03/16/2023 20:59    Assessment: 77 y.o. male   Hyperbilirubinemia, secondary to CBD stent obstruction Pancreatic cancer, status post neoadjuvant chemo and radiation, scheduled to have Whipple surgery at [facility] on Apr 14, 2023. Possible cholangitis BPH    Plan:  -I have reviewed his ultrasound and MRCP scan images, and discussed the findings with patient.  Fortunately there is no  evidence of cancer progression or metastatic disease, his obstructive jaundice is likely related to stent occlusion.  He is scheduled for ERCP with Dr. Ewing Schlein tomorrow. -I will follow-up as needed.  I will send a message to his surgeon Dr. [name] at [facility] regarding his hospital admission.  I encouraged him to follow-up as scheduled.   [name], MD 03/17/2023  7:05 PM

## 2023-03-17 NOTE — Progress Notes (Signed)
[facility] PROGRESS NOTE  [name] (DOB: 10/22/1946) ZOX:[id number] PCP: [name], MD Outpatient Specialists: Enid Baas; [facility] Dr. [name]; Oncology, Dr. Mosetta Putt.  Brief Narrative: [name] is a 77 y.o. male with a history of BPH, asthma, and pancreatic CA s/p chemotherapy and radiation with plans for Whipple procedure 5/30 at [facility] who was found to have hyperbilirubinemia (5.1 from 0.9) during routine labs 5/1, sent for direct admission from oncology clinic. MRCP confirms good position of previously placed biliary stent with proximal ductal dilatation suggestive of stent occlusion. GI is consulted, patient is NPO.   Subjective: No significant pain. He's hungry. No nausea or vomiting. Still dark urine, light green stool.  Objective: BP (!) 107/58   Pulse 69   Temp 98.4 F (36.9 C) (Oral)   Resp 18   Ht 5\' 8"  (1.727 m)   Wt 94 kg   SpO2 97%   BMI 31.51 kg/m   Gen: No distress Pulm: Clear, nonlabored  CV: RRR, no MRG. Trace LE edema. GI: Soft, NT, ND, +BS. Neuro: Alert and oriented. No new focal deficits. Ext: Warm, no deformities. Skin: No rashes, lesions or ulcers on visualized skin   Assessment & Plan: Hyperbilirubinemia, suspicion for obstructive jaundice with ductal dilatation on MRCP: Bilirubin 5.1 from previous 0.9, CBD 11.47mm by U/S, 19mm by MRCP proximal to well-positioned stent (uncovered metal stent placed July 2023).  - Keep NPO pending GI recommendations Re: stent exchange. Will start maintenance IVF. - Follow blood cultures from 5/1 (pt reported shaking chills, though WBC, fever, exam reassuring) - Trend LFTs (pending this AM).    Pancreatic CA: No discrete mass identified by MRCP, no hepatic lesions to suggest metastatic disease.  - Completed 6 months neoadjuvant chemotherapy July 2023 - Feb 2024 and XRT completed 03/04/2023. Plan to follow up with [facility] for Whipple 04/14/2023.   Weakness: Reports walking with a steady gait  but fell a few weeks ago at home and waited 3 hours for help getting up.  - PT/OT evaluation   Asthma: Quiescent, rarely uses albuterol - Albuterol prn.    Normocytic anemia: Mild. Vitamin B12 497, folate 13.9, ferritin 365, iron 13, 4% sat. No bleeding.   HLD:  - Hold statin for now with elevated LFTs.   BPH:  - Continue rapaflo (tamsulosin formulary) and finasteride. Voiding well, urine amber w/bili otherwise normal by UA.    Obesity: Body mass index is 31.51 kg/m.    Tyrone Nine, MD [facility] [email or web address] 03/17/2023, 8:16 AM

## 2023-03-17 NOTE — Progress Notes (Signed)
PT Cancellation Note  Patient Details Name: [name] MRN: [id number] DOB: 1946-02-06   Cancelled Treatment:    Reason Eval/Treat Not Completed: PT screened, no needs identified, will sign off Explained role of Acute PT, and pt adamantly declines need at this time.  Pt up in room independently per nursing staff.  PT to sign off.   Janan Halter Payson 03/17/2023, 10:55 AM Paulino Door, DPT Physical Therapist Acute Rehabilitation Services Office: [phone]
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
## 2023-03-18 ENCOUNTER — Other Ambulatory Visit: Payer: Self-pay

## 2023-03-18 LAB — CBC
HCT: 32 % — ABNORMAL LOW (ref 39.0–52.0)
Hemoglobin: 10.2 g/dL — ABNORMAL LOW (ref 13.0–17.0)
MCH: 28.7 pg (ref 26.0–34.0)
MCHC: 31.9 g/dL (ref 30.0–36.0)
MCV: 89.9 fL (ref 80.0–100.0)
Platelets: 123 10*3/uL — ABNORMAL LOW (ref 150–400)
RBC: 3.56 MIL/uL — ABNORMAL LOW (ref 4.22–5.81)
RDW: 13.8 % (ref 11.5–15.5)
WBC: 3.6 10*3/uL — ABNORMAL LOW (ref 4.0–10.5)
nRBC: 0 % (ref 0.0–0.2)

## 2023-03-18 LAB — COMPREHENSIVE METABOLIC PANEL
ALT: 90 U/L — ABNORMAL HIGH (ref 0–44)
AST: 82 U/L — ABNORMAL HIGH (ref 15–41)
Albumin: 2.8 g/dL — ABNORMAL LOW (ref 3.5–5.0)
Alkaline Phosphatase: 317 U/L — ABNORMAL HIGH (ref 38–126)
Anion gap: 5 (ref 5–15)
BUN: 11 mg/dL (ref 8–23)
CO2: 22 mmol/L (ref 22–32)
Calcium: 8.3 mg/dL — ABNORMAL LOW (ref 8.9–10.3)
Chloride: 107 mmol/L (ref 98–111)
Creatinine, Ser: 0.89 mg/dL (ref 0.61–1.24)
GFR, Estimated: >60 mL/min (ref >60)
Glucose, Bld: 119 mg/dL — ABNORMAL HIGH (ref 70–99)
Potassium: 3.7 mmol/L (ref 3.5–5.1)
Sodium: 134 mmol/L — ABNORMAL LOW (ref 135–145)
Total Bilirubin: 4.8 mg/dL — ABNORMAL HIGH (ref 0.3–1.2)
Total Protein: 6.1 g/dL — ABNORMAL LOW (ref 6.5–8.1)

## 2023-03-18 LAB — CULTURE, BLOOD (ROUTINE X 2)

## 2023-03-18 NOTE — Progress Notes (Signed)
[name] 3:56 PM  Subjective: Patient seen and examined and discussed with Dr. Lorenso Quarry and is familiar to me from ERCP in July and unfortunately due to anesthesia we were unable to get his procedure done Thursday or Friday and he has an upcoming [facility] appointment and we discussed his procedure tomorrow with my partner to include possibly just cleaning out the stent or putting a stent inside that 1 as well  Objective: Signs stable afebrile no acute distress abdomen is soft nontender CT MRI reviewed LFTs about the same white count okay  Assessment: Colonic metal stent in patient with pancreatic cancer  Plan: Okay for soft solids this evening but then n.p.o. after midnight for procedure tomorrow which we discussed  [facility] E  office [phone] After 5PM or if no answer call [phone]

## 2023-03-18 NOTE — H&P (View-Only) (Signed)
[name] 3:56 PM  Subjective: Patient seen and examined and discussed with Dr. Vreeland and is familiar to me from ERCP in July and unfortunately due to anesthesia we were unable to get his procedure done Thursday or Friday and he has an upcoming [facility] appointment and we discussed his procedure tomorrow with my partner to include possibly just cleaning out the stent or putting a stent inside that 1 as well  Objective: Signs stable afebrile no acute distress abdomen is soft nontender CT MRI reviewed LFTs about the same white count okay  Assessment: Colonic metal stent in patient with pancreatic cancer  Plan: Okay for soft solids this evening but then n.p.o. after midnight for procedure tomorrow which we discussed  [facility] E  office [phone] After 5PM or if no answer call [phone]  

## 2023-03-18 NOTE — Progress Notes (Signed)
OT Cancellation Note  Patient Details Name: [name] MRN: [id number] DOB: 10/18/1946   Cancelled Treatment:    Reason Eval/Treat Not Completed: OT screened, no needs identified, will sign off. OT spoke with the pt who adamantly denied the need for therapy services. OT will sign off. Thank you.    Reuben Likes, OTR/L 03/18/2023, 1:04 PM

## 2023-03-18 NOTE — Progress Notes (Signed)
[facility] PROGRESS NOTE  MICHIO BODENSCHATZ (DOB: [birth year]) AVW:[id number] PCP: [name], MD Outpatient Specialists: Enid Baas; [facility] Dr. [name]; Oncology, Dr. Mosetta Putt.  Brief Narrative: NESBITT GANDIA is a 77 y.o. male with a history of BPH, asthma, and pancreatic CA s/p chemotherapy and radiation with plans for Whipple procedure 5/30 at [facility] who was found to have hyperbilirubinemia (5.1 from 0.9) during routine labs 5/1, sent for direct admission from oncology clinic. MRCP confirms good position of previously placed biliary stent with proximal ductal dilatation suggestive of stent occlusion. GI is consulted, patient is NPO.   Subjective: No acute issues or events overnight denies nausea vomiting diarrhea constipation headache fevers chills or chest pain.  Abdominal pain ongoing but markedly improved from prior.  Objective: BP (!) 140/65 (BP Location: Right Arm)   Pulse 66   Temp 98.2 F (36.8 C) (Oral)   Resp 18   Ht 5\' 8"  (1.727 m)   Wt 94 kg   SpO2 100%   BMI 31.51 kg/m   Gen: No distress Pulm: Clear, nonlabored  CV: RRR, no MRG. Trace LE edema. GI: Soft, NT, ND, +BS. Neuro: Alert and oriented. No new focal deficits. Ext: Warm, no deformities. Skin: No rashes, lesions or ulcers on visualized skin   Assessment & Plan:  Hyperbilirubinemia, secondary to biliary stent occlusion with concurrent obstructive jaundice and ductal dilatation on MRCP:  - Bilirubin peak 5.6 from baseline 0.9, CBD 11.31mm by U/S, 19mm by MRCP proximal to well-positioned stent (uncovered metal stent placed July 2023).  - Keep NPO - Planned ERCP today per GI (Dr Ewing Schlein to see) - Follow blood cultures from 5/1 (pt reported shaking chills, though WBC, fever, exam reassuring) - Trend LFTs - downtrending with IVF    Pancreatic CA:  - No discrete mass identified by MRCP, no hepatic lesions to suggest metastatic disease.  - Completed 6 months neoadjuvant chemotherapy July 2023 - Feb 2024  and XRT completed 03/04/2023. Plan to follow up with [facility] for Whipple 04/14/2023.   Weakness/ambulatory dysfunction:  - Reports walking with a steady gait but fell a few weeks ago at home and waited 3 hours for help getting up (could not get up on his own).  - PT/OT evaluation   Asthma: Quiescent, rarely uses albuterol - Albuterol prn.    Normocytic anemia: Mild. Vitamin B12 497, folate 13.9, ferritin 365, iron 13, 4% sat. No bleeding.   HLD: - Hold statin for now with elevated LFTs.   BPH: - Continue rapaflo (tamsulosin formulary) and finasteride. Voiding well, urine amber w/bili otherwise normal by UA.    Obesity: Body mass index is 31.51 kg/m.    Azucena Fallen, DO [facility] [email or web address] 03/18/2023, 8:18 AM

## 2023-03-18 NOTE — Anesthesia Preprocedure Evaluation (Signed)
Anesthesia Evaluation  Patient identified by MRN, date of birth, ID band Patient awake    Reviewed: Allergy & Precautions, NPO status , Patient's Chart, lab work & pertinent test results  History of Anesthesia Complications Negative for: history of anesthetic complications  Airway Mallampati: III  TM Distance: >3 FB Neck ROM: Full    Dental  (+) Dental Advisory Given,    Pulmonary neg shortness of breath, asthma (no recent flares or inhaler use) , neg sleep apnea, neg COPD, neg recent URI, former smoker   Pulmonary exam normal breath sounds clear to auscultation       Cardiovascular hypertension, (-) angina (-) Past MI, (-) Cardiac Stents and (-) CABG (-) dysrhythmias  Rhythm:Regular Rate:Normal  HLD, carotid atherosclerosis   Neuro/Psych neg Seizures  Neuromuscular disease (peripheral neuropathy)    GI/Hepatic ,GERD  ,,Elevated LFTs, obstructive jaundice Pancreatic adenocarcinoma   Endo/Other  diabetes    Renal/GU Renal disease (renal cysts)     Musculoskeletal  (+) Arthritis , Osteoarthritis,    Abdominal  (+) + obese  Peds  Hematology negative hematology ROS (+)   Anesthesia Other Findings 77 y.o. male with a history of BPH, asthma, and pancreatic CA s/p chemotherapy and radiation with plans for Whipple procedure 5/30 at [facility] who was found to have hyperbilirubinemia (5.1 from 0.9) during routine labs 5/1, sent for direct admission from oncology clinic. MRCP confirms good position of previously placed biliary stent with proximal ductal dilatation suggestive of stent occlusion.   Reproductive/Obstetrics                             Anesthesia Physical Anesthesia Plan  ASA: 4  Anesthesia Plan: General   Post-op Pain Management:    Induction: Intravenous  PONV Risk Score and Plan: 2 and Ondansetron and Treatment may vary due to age or medical condition  Airway Management Planned: Oral  ETT  Additional Equipment:   Intra-op Plan:   Post-operative Plan: Extubation in OR  Informed Consent: I have reviewed the patients History and Physical, chart, labs and discussed the procedure including the risks, benefits and alternatives for the proposed anesthesia with the patient or authorized representative who has indicated his/her understanding and acceptance.     Dental advisory given  Plan Discussed with: CRNA and Anesthesiologist  Anesthesia Plan Comments: (Risks of general anesthesia discussed including, but not limited to, sore throat, hoarse voice, chipped/damaged teeth, injury to vocal cords, nausea and vomiting, allergic reactions, lung infection, heart attack, stroke, and death. All questions answered. )       Anesthesia Quick Evaluation

## 2023-03-19 ENCOUNTER — Inpatient Hospital Stay (HOSPITAL_COMMUNITY): Payer: Medicare Other | Admitting: Anesthesiology

## 2023-03-19 ENCOUNTER — Encounter (HOSPITAL_COMMUNITY): Payer: Self-pay | Admitting: Family Medicine

## 2023-03-19 ENCOUNTER — Inpatient Hospital Stay (HOSPITAL_COMMUNITY): Payer: Medicare Other

## 2023-03-19 ENCOUNTER — Encounter (HOSPITAL_COMMUNITY)
Admission: AD | Disposition: A | Discharge status: Home / Self Care | Payer: Self-pay | Source: Ambulatory Visit | Attending: Family Medicine

## 2023-03-19 DIAGNOSIS — T85520A Displacement of bile duct prosthesis, initial encounter: Secondary | ICD-10-CM

## 2023-03-19 DIAGNOSIS — I10 Essential (primary) hypertension: Secondary | ICD-10-CM

## 2023-03-19 DIAGNOSIS — Z87891 Personal history of nicotine dependence: Secondary | ICD-10-CM

## 2023-03-19 DIAGNOSIS — R748 Abnormal levels of other serum enzymes: Secondary | ICD-10-CM

## 2023-03-19 HISTORY — PX: REMOVAL OF STONES: SHX5545

## 2023-03-19 HISTORY — PX: ERCP: SHX5425

## 2023-03-19 HISTORY — PX: BILIARY STENT PLACEMENT: SHX5538

## 2023-03-19 LAB — COMPREHENSIVE METABOLIC PANEL
ALT: 72 U/L — ABNORMAL HIGH (ref 0–44)
AST: 62 U/L — ABNORMAL HIGH (ref 15–41)
Albumin: 2.8 g/dL — ABNORMAL LOW (ref 3.5–5.0)
Alkaline Phosphatase: 284 U/L — ABNORMAL HIGH (ref 38–126)
Anion gap: 8 (ref 5–15)
BUN: 10 mg/dL (ref 8–23)
CO2: 23 mmol/L (ref 22–32)
Calcium: 8.4 mg/dL — ABNORMAL LOW (ref 8.9–10.3)
Chloride: 106 mmol/L (ref 98–111)
Creatinine, Ser: 0.92 mg/dL (ref 0.61–1.24)
GFR, Estimated: >60 mL/min (ref >60)
Glucose, Bld: 100 mg/dL — ABNORMAL HIGH (ref 70–99)
Potassium: 3.9 mmol/L (ref 3.5–5.1)
Sodium: 137 mmol/L (ref 135–145)
Total Bilirubin: 4.2 mg/dL — ABNORMAL HIGH (ref 0.3–1.2)
Total Protein: 6.1 g/dL — ABNORMAL LOW (ref 6.5–8.1)

## 2023-03-19 LAB — CBC
HCT: 30.9 % — ABNORMAL LOW (ref 39.0–52.0)
Hemoglobin: 10 g/dL — ABNORMAL LOW (ref 13.0–17.0)
MCH: 28.8 pg (ref 26.0–34.0)
MCHC: 32.4 g/dL (ref 30.0–36.0)
MCV: 89 fL (ref 80.0–100.0)
Platelets: 121 10*3/uL — ABNORMAL LOW (ref 150–400)
RBC: 3.47 MIL/uL — ABNORMAL LOW (ref 4.22–5.81)
RDW: 13.7 % (ref 11.5–15.5)
WBC: 2 10*3/uL — ABNORMAL LOW (ref 4.0–10.5)
nRBC: 0 % (ref 0.0–0.2)

## 2023-03-19 LAB — GLUCOSE, CAPILLARY: Glucose-Capillary: 103 mg/dL — ABNORMAL HIGH (ref 70–99)

## 2023-03-19 LAB — CULTURE, BLOOD (ROUTINE X 2): Culture: NO GROWTH

## 2023-03-19 SURGERY — ERCP, WITH INTERVENTION IF INDICATED
Anesthesia: General

## 2023-03-19 MED ORDER — AMISULPRIDE (ANTIEMETIC) 5 MG/2ML IV SOLN: 10.0000 mg | Freq: Once | INTRAVENOUS | Status: DC | PRN

## 2023-03-19 MED ORDER — FENTANYL CITRATE (PF) 100 MCG/2ML IJ SOLN
INTRAMUSCULAR | Status: DC | PRN
  Administered 2023-03-19: 50 ug via INTRAVENOUS

## 2023-03-19 MED ORDER — PROPOFOL 10 MG/ML IV BOLUS
INTRAVENOUS | Status: DC | PRN
  Administered 2023-03-19: 150 mg via INTRAVENOUS

## 2023-03-19 MED ORDER — LIDOCAINE 2% (20 MG/ML) 5 ML SYRINGE
INTRAMUSCULAR | Status: DC | PRN
  Administered 2023-03-19: 100 mg via INTRAVENOUS

## 2023-03-19 MED ORDER — FENTANYL CITRATE (PF) 100 MCG/2ML IJ SOLN
INTRAMUSCULAR | Status: AC
  Filled 2023-03-19: qty 2

## 2023-03-19 MED ORDER — SODIUM CHLORIDE 0.9 % IV SOLN
INTRAVENOUS | Status: DC | PRN
  Administered 2023-03-19: 30 mL

## 2023-03-19 MED ORDER — SUGAMMADEX SODIUM 200 MG/2ML IV SOLN
INTRAVENOUS | Status: DC | PRN
  Administered 2023-03-19: 200 mg via INTRAVENOUS

## 2023-03-19 MED ORDER — SODIUM CHLORIDE 0.9 % IV SOLN: INTRAVENOUS | Status: DC

## 2023-03-19 MED ORDER — PROPOFOL 500 MG/50ML IV EMUL
INTRAVENOUS | Status: AC
  Filled 2023-03-19: qty 100

## 2023-03-19 MED ORDER — HEPARIN SOD (PORK) LOCK FLUSH 100 UNIT/ML IV SOLN
500.0000 [IU] | Freq: Once | INTRAVENOUS | Status: AC
  Administered 2023-03-19: 500 [IU] via INTRAVENOUS
  Filled 2023-03-19: qty 5

## 2023-03-19 MED ORDER — DEXAMETHASONE SODIUM PHOSPHATE 10 MG/ML IJ SOLN
INTRAMUSCULAR | Status: DC | PRN
  Administered 2023-03-19: 5 mg via INTRAVENOUS

## 2023-03-19 MED ORDER — OXYCODONE HCL 5 MG/5ML PO SOLN: 5.0000 mg | Freq: Once | ORAL | Status: DC | PRN

## 2023-03-19 MED ORDER — ROCURONIUM BROMIDE 10 MG/ML (PF) SYRINGE
PREFILLED_SYRINGE | INTRAVENOUS | Status: DC | PRN
  Administered 2023-03-19: 50 mg via INTRAVENOUS

## 2023-03-19 MED ORDER — PHENYLEPHRINE 80 MCG/ML (10ML) SYRINGE FOR IV PUSH (FOR BLOOD PRESSURE SUPPORT)
PREFILLED_SYRINGE | INTRAVENOUS | Status: AC
  Filled 2023-03-19: qty 10

## 2023-03-19 MED ORDER — LACTATED RINGERS IV SOLN: INTRAVENOUS | Status: DC | PRN

## 2023-03-19 MED ORDER — ONDANSETRON HCL 4 MG PO TABS: 4.0000 mg | ORAL_TABLET | Freq: Four times a day (QID) | ORAL | 0 refills | Status: DC | PRN

## 2023-03-19 MED ORDER — PHENYLEPHRINE 80 MCG/ML (10ML) SYRINGE FOR IV PUSH (FOR BLOOD PRESSURE SUPPORT)
PREFILLED_SYRINGE | INTRAVENOUS | Status: DC | PRN
  Administered 2023-03-19 (×4): 160 ug via INTRAVENOUS

## 2023-03-19 MED ORDER — DICLOFENAC SUPPOSITORY 100 MG
RECTAL | Status: AC
  Filled 2023-03-19: qty 1

## 2023-03-19 MED ORDER — OXYCODONE HCL 5 MG PO TABS: 5.0000 mg | ORAL_TABLET | Freq: Once | ORAL | Status: DC | PRN

## 2023-03-19 MED ORDER — ONDANSETRON HCL 4 MG/2ML IJ SOLN
INTRAMUSCULAR | Status: DC | PRN
  Administered 2023-03-19: 4 mg via INTRAVENOUS

## 2023-03-19 MED ORDER — LACTATED RINGERS IV SOLN: INTRAVENOUS | Status: DC

## 2023-03-19 MED ORDER — GLUCAGON HCL RDNA (DIAGNOSTIC) 1 MG IJ SOLR
INTRAMUSCULAR | Status: AC
  Filled 2023-03-19: qty 1

## 2023-03-19 MED ORDER — DICLOFENAC SUPPOSITORY 100 MG
RECTAL | Status: DC | PRN
  Administered 2023-03-19: 100 mg via RECTAL

## 2023-03-19 MED ORDER — IBUPROFEN 400 MG PO TABS: 400.0000 mg | ORAL_TABLET | Freq: Four times a day (QID) | ORAL | 0 refills | Status: DC | PRN

## 2023-03-19 MED ORDER — FENTANYL CITRATE (PF) 100 MCG/2ML IJ SOLN: 25.0000 ug | INTRAMUSCULAR | Status: DC | PRN

## 2023-03-19 NOTE — Anesthesia Postprocedure Evaluation (Signed)
Anesthesia Post Note  Patient: NEHORAI REHBEIN  Procedure(s) Performed: ENDOSCOPIC RETROGRADE CHOLANGIOPANCREATOGRAPHY (ERCP) REMOVAL OF STONES BILIARY STENT PLACEMENT     Patient location during evaluation: PACU Anesthesia Type: General Level of consciousness: awake Pain management: pain level controlled Vital Signs Assessment: post-procedure vital signs reviewed and stable Respiratory status: spontaneous breathing, nonlabored ventilation and respiratory function stable Cardiovascular status: blood pressure returned to baseline and stable Postop Assessment: no apparent nausea or vomiting Anesthetic complications: no   No notable events documented.  Last Vitals:  Vitals:   03/19/23 0845 03/19/23 0900  BP: (!) 143/67 (!) 144/65  Pulse: 69 64  Resp: 16 12  Temp: (!) 36.2 C   SpO2: 98% 100%    Last Pain:  Vitals:   03/19/23 0900  TempSrc:   PainSc: 0-No pain                 Linton Rump

## 2023-03-19 NOTE — Interval H&P Note (Signed)
History and Physical Interval Note: 76/male with abnormal LFTs and imaging, possible occluded previously placed CBD stent for ERCP with propofol.  03/19/2023 7:35 AM  [name]  has presented today for ERCP, with the diagnosis of Abnormal MRI imaging, obstructive jaundice.  The various methods of treatment have been discussed with the patient and family. After consideration of risks, benefits and other options for treatment, the patient has consented to  Procedure(s): ENDOSCOPIC RETROGRADE CHOLANGIOPANCREATOGRAPHY (ERCP) (N/A) as a surgical intervention.  The patient's history has been reviewed, patient examined, no change in status, stable for surgery.  I have reviewed the patient's chart and labs.  Questions were answered to the patient's satisfaction.     [name]

## 2023-03-19 NOTE — Anesthesia Procedure Notes (Signed)
Procedure Name: Intubation Date/Time: 03/19/2023 7:40 AM  Performed by: Doran Clay, CRNAPre-anesthesia Checklist: Patient identified, Emergency Drugs available, Suction available and Patient being monitored Patient Re-evaluated:Patient Re-evaluated prior to induction Oxygen Delivery Method: Circle system utilized Preoxygenation: Pre-oxygenation with 100% oxygen Induction Type: IV induction Ventilation: Mask ventilation without difficulty Laryngoscope Size: Mac and 4 Grade View: Grade I Tube type: Oral Tube size: 7.5 mm Number of attempts: 1 Airway Equipment and Method: Stylet Placement Confirmation: ETT inserted through vocal cords under direct vision, positive ETCO2 and breath sounds checked- equal and bilateral Secured at: 23 cm Tube secured with: Tape Dental Injury: Teeth and Oropharynx as per pre-operative assessment

## 2023-03-19 NOTE — Op Note (Signed)
[facility] Patient Name: [name] Procedure Date: 03/19/2023 MRN: [id number] Attending MD: [name] , MD, [id number] Date of Birth: January 07, 1946 CSN: [id number] Age: 77 Admit Type: Inpatient Procedure:                ERCP Indications:              Abnormal MRCP, Elevated liver enzymes, suspected                            occlusion of previoulsy placed uncovered metal                            stent for pancreatic cancer Providers:                [name], MD, Janae Sauce. Steele Berg, RN, Leanne Lovely, Technician Referring MD:             [name] Medicines:                Monitored Anesthesia Care Complications:            No immediate complications. Estimated Blood Loss:     Estimated blood loss: none. Procedure:                Pre-Anesthesia Assessment:                           - Prior to the procedure, a History and Physical                            was performed, and patient medications and                            allergies were reviewed. The patient's tolerance of                            previous anesthesia was also reviewed. The risks                            and benefits of the procedure and the sedation                            options and risks were discussed with the patient.                            All questions were answered, and informed consent                            was obtained. Prior Anticoagulants: The patient has                            taken no anticoagulant or antiplatelet agents. ASA                            Grade  Assessment: IV - A patient with severe                            systemic disease that is a constant threat to life.                            After reviewing the risks and benefits, the patient                            was deemed in satisfactory condition to undergo the                            procedure.                           After obtaining informed consent, the  scope was                            passed under direct vision. Throughout the                            procedure, the patient's blood pressure, pulse, and                            oxygen saturations were monitored continuously. The                            [employer] single use                            duodenoscope was introduced through the mouth, and                            used to inject contrast into and used to inject                            contrast into the bile duct. The ERCP was                            accomplished without difficulty. The patient                            tolerated the procedure well. Scope In: Scope Out: Findings:      The scout film was normal. The esophagus was successfully intubated       under direct vision. The scope was advanced to a normal major papilla in       the descending duodenum without detailed examination of the pharynx,       larynx and associated structures, and upper GI tract. The upper GI tract       was grossly normal. Large amount of retained food was noted in the       gastric cavity.      Previously placed metal stent was NOT noted at the ampulla and appeared       proximally migrated  on X ray.      The bile duct was deeply cannulated with the sphincterotome. Contrast       was injected. I personally interpreted the bile duct images. There was       brisk flow of contrast through the ducts. Image quality was excellent.       Contrast extended to the main bile duct. One stent which had been placed       through the major papilla into the biliary tree was no longer visible.       It had migrated into the duct. A straight Roadrunner wire was passed       into the biliary tree.      The biliary tree was swept with a 12 mm balloon starting at the       bifurcation. Small amount of sludge was swept from the duct.      Initial attempt to place a 10 mm by 8 cm stent was aborted as the       proximal end  of the stent was noted in the left intrahepatic and the       distal end appeared too long for duodenal lumen.      One 10 mm by 6 cm uncovered metal stent was placed 5.5 cm into the       common bile duct. Bile flowed through the stent. The stent was in good       position (proximal end was above the migrated stent).      The pancreatic duct was not canulated or injected during the procedure. Impression:               - A previously placed stent had migrated into the                            biliary tree.                           - One uncovered metal stent was placed into the                            common bile duct(within the previous stent).                           - The biliary tree was swept and sludge was found. Moderate Sedation:      Patient did not receive moderate sedation for this procedure, but       instead received monitored anesthesia care. Recommendation:           - Resume regular diet. Procedure Code(s):        --- Professional ---                           [phone], Endoscopic retrograde                            cholangiopancreatography (ERCP); with removal and                            exchange of stent(s), biliary or pancreatic duct,  including pre- and post-dilation and guide wire                            passage, when performed, including sphincterotomy,                            when performed, each stent exchanged                           [address]64, Endoscopic retrograde                            cholangiopancreatography (ERCP); with removal of                            calculi/debris from biliary/pancreatic duct(s)                           [address], Endoscopic catheterization of the biliary                            ductal system, radiological supervision and                            interpretation Diagnosis Code(s):        --- Professional ---                           U04.540J, Displacement of bile duct prosthesis,                             initial encounter                           R74.8, Abnormal levels of other serum enzymes                           R93.2, Abnormal findings on diagnostic imaging of                            liver and biliary tract CPT copyright 2022 [employer]. All rights reserved. The codes documented in this report are preliminary and upon coder review may  be revised to meet current compliance requirements. [name], MD 03/19/2023 8:30:38 AM This report has been signed electronically. Number of Addenda: 0

## 2023-03-19 NOTE — Discharge Summary (Signed)
Physician Discharge Summary  [name] ZOX:[id number] DOB: [birth year] DOA: 03/16/2023  PCP: [name], MD  Admit date: 03/16/2023 Discharge date: 03/19/2023  Admitted From: Home Disposition:  Home  Recommendations for Outpatient Follow-up:  Follow up with PCP in 1-2 weeks Follow up with GI as scheduled  Home Health:None  Equipment/Devices:None  Discharge Condition:Stable  CODE STATUS:Full  Diet recommendation: Low fat  Brief/Interim Summary: [name] is a 77 y.o. male with a history of BPH, asthma, and pancreatic CA s/p chemotherapy and radiation with plans for Whipple procedure 5/30 at [facility] who was found to have hyperbilirubinemia (5.1 from 0.9) during routine labs 5/1, sent for direct admission from oncology clinic. MRCP confirms good position of previously placed biliary stent with proximal ductal dilatation suggestive of stent occlusion. GI is consulted   Patient admitted as above with acute abdominal pain concerning for biliary tree obstruction.  Patient had recent biliary stent placed.  On evaluation after ERCP patient had noted to have migration of biliary stent which had been replaced during procedure without incident, tolerated procedure quite well and otherwise stable and agreeable for discharge home. Follow up with PCP and GI as scheduled.  Discharge Diagnoses:  Principal Problem:   Hyperbilirubinemia Active Problems:   Pancreatic adenocarcinoma (HCC)   Essential hypertension   Dyslipidemia   Obesity (BMI 30-39.9)   Asthma   Elevated liver function tests   Port-A-Cath in place   Peripheral neuropathy due to chemotherapy [facility])    Discharge Instructions   Allergies as of 03/19/2023   No Known Allergies      Medication List     TAKE these medications    Accu-Chek Guide test strip Generic drug: glucose blood USE TO check blood sugar DAILY AS DIRECTED   acetaminophen 650 MG CR tablet Commonly known as: TYLENOL Take 1,300 mg by mouth as  needed for pain.   albuterol 108 (90 Base) MCG/ACT inhaler Commonly known as: VENTOLIN HFA Inhale 2 puffs into the lungs every 6 (six) hours as needed for wheezing or shortness of breath. Inhale 2 puffs in the lungs every 4 hours as needed for cough, wheezing, SOB. What changed: additional instructions   finasteride 5 MG tablet Commonly known as: PROSCAR Take 5 mg by mouth daily.   ibuprofen 400 MG tablet Commonly known as: ADVIL Take 1 tablet (400 mg total) by mouth every 6 (six) hours as needed for mild pain, fever or moderate pain. What changed:  medication strength how much to take reasons to take this   Lancets Misc Test blood sugar daily. Dx code: 250.00   lidocaine-prilocaine cream Commonly known as: EMLA Apply 1 Application topically as needed. What changed: reasons to take this   ondansetron 4 MG tablet Commonly known as: ZOFRAN Take 1 tablet (4 mg total) by mouth every 6 (six) hours as needed for nausea. What changed:  medication strength how much to take when to take this reasons to take this additional instructions   prochlorperazine 10 MG tablet Commonly known as: COMPAZINE Take 1 tablet (10 mg total) by mouth every 6 (six) hours as needed.   silodosin 8 MG Caps capsule Commonly known as: RAPAFLO Take 8 mg by mouth daily.   simvastatin 20 MG tablet Commonly known as: ZOCOR TAKE ONE TABLET BY MOUTH EVERYDAY AT BEDTIME What changed: See the new instructions.        No Known Allergies  Consultations: GI   Procedures/Studies: DG ERCP  Result Date: 03/19/2023 CLINICAL DATA:  Cholangitis  EXAM: ERCP TECHNIQUE: Multiple spot images obtained with the fluoroscopic device and submitted for interpretation post-procedure. FLUOROSCOPY: Radiation Exposure Index (as provided by the fluoroscopic device): 80.28 mGy Kerma COMPARISON:  MRCP 03/17/2023 FINDINGS: A total of 12 intraoperative spot images are submitted for review. The images demonstrate a flexible  duodenal scope in the descending duodenum. A metal biliary stent is present. Subsequent images demonstrate successful cannulation of the stent and cholangiogram demonstrating stent occlusion. Subsequently, balloon sweeping of the stent system is performed. Finally, a second stent is placed coaxially within the first stent. IMPRESSION: 1. Occluded previously placed biliary stent likely due to tumor ingrowth. 2. Successful ERCP with balloon sweeping of the previously placed stent and placement of a new stent. These images were submitted for radiologic interpretation only. Please see the procedural report for the amount of contrast and the fluoroscopy time utilized. Electronically Signed   By: Malachy Moan M.D.   On: 03/19/2023 08:54   MR ABDOMEN MRCP W WO CONTAST  Result Date: 03/17/2023 CLINICAL DATA:  Pancreatic cancer.  Hyperbilirubinemia.  Pain. EXAM: MRI ABDOMEN WITHOUT AND WITH CONTRAST (INCLUDING MRCP) TECHNIQUE: Multiplanar multisequence MR imaging of the abdomen was performed both before and after the administration of intravenous contrast. Heavily T2-weighted images of the biliary and pancreatic ducts were obtained, and three-dimensional MRCP images were rendered by post processing. CONTRAST:  9mL GADAVIST GADOBUTROL 1 MMOL/ML IV SOLN COMPARISON:  Ultrasound 03/16/2023, CT scan 09/02/2022 and prior MRI 05/22/2022 FINDINGS: Lower chest: The lung bases are clear of an acute pulmonary process. Very small right pleural effusion. No pericardial effusion. Hepatobiliary: New intrahepatic biliary dilatation and dilatation of the proximal common bile duct to a maximum of 19 mm. The biliary stent appears to be in good position but is likely occluded. No hepatic lesions to suggest metastatic disease. Mild distention of the gallbladder. No gallstones or findings for acute cholecystitis. Pancreas: No discrete pancreatic mass is identified. Normal caliber and course of the pancreatic duct. Spleen:  Normal size.   No focal lesions. Adrenals/Urinary Tract: The adrenal glands and kidneys are and stable. Stomach/Bowel: Stomach duodenum, visualized small bowel and visualized colon are unremarkable. Vascular/Lymphatic: The aorta and branch vessels are patent. The major venous structures are patent. Portal and splenic veins are patent. No mesenteric or retroperitoneal lymphadenopathy. Other:  No ascites or omental disease. Musculoskeletal: No significant bony findings. IMPRESSION: 1. New moderate intrahepatic biliary dilatation and dilatation of the proximal common bile duct to a maximum of 19 mm. The biliary stent appears to be in good position but is likely occluded. 2. No discrete pancreatic mass is identified. Normal caliber and course of the pancreatic duct. 3. No hepatic lesions to suggest metastatic disease. Electronically Signed   By: Rudie Meyer M.D.   On: 03/17/2023 08:13   MR 3D Recon At Scanner  Result Date: 03/17/2023 CLINICAL DATA:  Pancreatic cancer.  Hyperbilirubinemia.  Pain. EXAM: MRI ABDOMEN WITHOUT AND WITH CONTRAST (INCLUDING MRCP) TECHNIQUE: Multiplanar multisequence MR imaging of the abdomen was performed both before and after the administration of intravenous contrast. Heavily T2-weighted images of the biliary and pancreatic ducts were obtained, and three-dimensional MRCP images were rendered by post processing. CONTRAST:  9mL GADAVIST GADOBUTROL 1 MMOL/ML IV SOLN COMPARISON:  Ultrasound 03/16/2023, CT scan 09/02/2022 and prior MRI 05/22/2022 FINDINGS: Lower chest: The lung bases are clear of an acute pulmonary process. Very small right pleural effusion. No pericardial effusion. Hepatobiliary: New intrahepatic biliary dilatation and dilatation of the proximal common bile duct to a  maximum of 19 mm. The biliary stent appears to be in good position but is likely occluded. No hepatic lesions to suggest metastatic disease. Mild distention of the gallbladder. No gallstones or findings for acute  cholecystitis. Pancreas: No discrete pancreatic mass is identified. Normal caliber and course of the pancreatic duct. Spleen:  Normal size.  No focal lesions. Adrenals/Urinary Tract: The adrenal glands and kidneys are and stable. Stomach/Bowel: Stomach duodenum, visualized small bowel and visualized colon are unremarkable. Vascular/Lymphatic: The aorta and branch vessels are patent. The major venous structures are patent. Portal and splenic veins are patent. No mesenteric or retroperitoneal lymphadenopathy. Other:  No ascites or omental disease. Musculoskeletal: No significant bony findings. IMPRESSION: 1. New moderate intrahepatic biliary dilatation and dilatation of the proximal common bile duct to a maximum of 19 mm. The biliary stent appears to be in good position but is likely occluded. 2. No discrete pancreatic mass is identified. Normal caliber and course of the pancreatic duct. 3. No hepatic lesions to suggest metastatic disease. Electronically Signed   By: Rudie Meyer M.D.   On: 03/17/2023 08:13   US Abdomen Limited RUQ (LIVER/GB)  Result Date: 03/16/2023 CLINICAL DATA:  Hyperbilirubinemia. EXAM: ULTRASOUND ABDOMEN LIMITED RIGHT UPPER QUADRANT COMPARISON:  None Available. FINDINGS: Gallbladder: No gallstones or wall thickening visualized (3.4 mm). No sonographic Murphy sign noted by sonographer. Common bile duct: Diameter: 11.1 mm Liver: No focal lesion identified. Within normal limits in parenchymal echogenicity. Portal vein is patent on color Doppler imaging with normal direction of blood flow towards the liver. Other: None. IMPRESSION: Dilated common bile duct without visualized obstructing lesions. Further evaluation with MRCP is recommended. Electronically Signed   By: Aram Candela M.D.   On: 03/16/2023 20:59     Subjective: No acute issues/events overnight   Discharge Exam: Vitals:   03/19/23 0845 03/19/23 0900  BP: (!) 143/67 (!) 144/65  Pulse: 69 64  Resp: 16 12  Temp: (!) 97.2  F (36.2 C)   SpO2: 98% 100%   Vitals:   03/19/23 0700 03/19/23 0833 03/19/23 0845 03/19/23 0900  BP: (!) 148/72 133/78 (!) 143/67 (!) 144/65  Pulse: 62 74 69 64  Resp: 16 16 16 12   Temp: 98.1 F (36.7 C) (!) 97.3 F (36.3 C) (!) 97.2 F (36.2 C)   TempSrc: Tympanic     SpO2: 100% 100% 98% 100%  Weight:      Height:        General: Pt is alert, awake, not in acute distress Cardiovascular: RRR, S1/S2 +, no rubs, no gallops Respiratory: CTA bilaterally, no wheezing, no rhonchi Abdominal: Soft, NT, ND, bowel sounds + Extremities: no edema, no cyanosis    The results of significant diagnostics from this hospitalization (including imaging, microbiology, ancillary and laboratory) are listed below for reference.     Microbiology: Recent Results (from the past 240 hour(s))  Culture, blood (Routine X 2) w Reflex to ID Panel     Status: None (Preliminary result)   Collection Time: 03/16/23  3:46 PM   Specimen: BLOOD  Result Value Ref Range Status   Specimen Description   Final    BLOOD SITE NOT SPECIFIED Performed at [facility], [address] [place], [place] [address]    Special Requests   Final    BOTTLES DRAWN AEROBIC ONLY Blood Culture adequate volume Performed at [facility], [address] [place], [place] [address]    Culture   Final    NO GROWTH  3 DAYS Performed at [facility], [address] [place], [place] [address]    Report Status PENDING  Incomplete  Culture, blood (Routine X 2) w Reflex to ID Panel     Status: None (Preliminary result)   Collection Time: 03/16/23  3:46 PM   Specimen: BLOOD  Result Value Ref Range Status   Specimen Description   Final    BLOOD SITE NOT SPECIFIED Performed at [facility], [address], [place], [place] [address]    Special Requests   Final    BOTTLES DRAWN AEROBIC ONLY Blood Culture adequate volume Performed at [facility], [address],  [place], [place] [address]    Culture   Final    NO GROWTH 3 DAYS Performed at [facility], [address], [place], [place] [address]    Report Status PENDING  Incomplete     Labs: BNP (last 3 results) No results for input(s): "BNP" in the last 8760 hours. Basic Metabolic Panel: Recent Labs  Lab 03/16/23 1050 03/17/23 0743 03/18/23 0529 03/19/23 0500  NA 138 137 134* 137  K 3.8 3.7 3.7 3.9  CL 105 106 107 106  CO2 27 23 22 23   GLUCOSE 111* 103* 119* 100*  BUN 11 14 11 10   CREATININE 0.84 0.82 0.89 0.92  CALCIUM 8.8* 8.5* 8.3* 8.4*   Liver Function Tests: Recent Labs  Lab 03/16/23 1050 03/16/23 1605 03/17/23 0743 03/18/23 0529 03/19/23 0500  AST 197*  --  113* 82* 62*  ALT 143*  --  106* 90* 72*  ALKPHOS 456*  --  327* 317* 284*  BILITOT 5.1* 5.6* 4.5* 4.8* 4.2*  PROT 6.3*  --  6.1* 6.1* 6.1*  ALBUMIN 3.5  --  2.8* 2.8* 2.8*   No results for input(s): "LIPASE", "AMYLASE" in the last 168 hours. No results for input(s): "AMMONIA" in the last 168 hours. CBC: Recent Labs  Lab 03/16/23 1050 03/17/23 0743 03/18/23 0529 03/19/23 0500  WBC 7.9 3.3* 3.6* 2.0*  NEUTROABS 7.1  --   --   --   HGB 11.2* 9.9* 10.2* 10.0*  HCT 33.3* 30.7* 32.0* 30.9*  MCV 88.1 90.0 89.9 89.0  PLT 151 139* 123* 121*   Cardiac Enzymes: No results for input(s): "CKTOTAL", "CKMB", "CKMBINDEX", "TROPONINI" in the last 168 hours. BNP: Invalid input(s): "POCBNP" CBG: Recent Labs  Lab 03/19/23 0834  GLUCAP 103*   D-Dimer No results for input(s): "DDIMER" in the last 72 hours. Hgb A1c No results for input(s): "HGBA1C" in the last 72 hours. Lipid Profile No results for input(s): "CHOL", "HDL", "LDLCALC", "TRIG", "CHOLHDL", "LDLDIRECT" in the last 72 hours. Thyroid function studies No results for input(s): "TSH", "T4TOTAL", "T3FREE", "THYROIDAB" in the last 72 hours.  Invalid input(s): "FREET3" Anemia work up Recent Labs    03/16/23 1602 03/16/23 1605  VITAMINB12  --  497   FOLATE 13.9  --   FERRITIN  --  365*  TIBC  --  308  IRON  --  13*  RETICCTPCT  --  1.4   Urinalysis    Component Value Date/Time   COLORURINE AMBER (A) 03/16/2023 1602   APPEARANCEUR CLEAR 03/16/2023 1602   LABSPEC 1.012 03/16/2023 1602   PHURINE 5.0 03/16/2023 1602   GLUCOSEU NEGATIVE 03/16/2023 1602   HGBUR NEGATIVE 03/16/2023 1602   BILIRUBINUR SMALL (A) 03/16/2023 1602   BILIRUBINUR Positive 05/21/2022 1133   KETONESUR NEGATIVE 03/16/2023 1602   PROTEINUR NEGATIVE 03/16/2023 1602  UROBILINOGEN 1.0 05/21/2022 1133   UROBILINOGEN 0.2 11/06/2007 0934   NITRITE NEGATIVE 03/16/2023 1602   LEUKOCYTESUR NEGATIVE 03/16/2023 1602   Sepsis Labs Recent Labs  Lab 03/16/23 1050 03/17/23 0743 03/18/23 0529 03/19/23 0500  WBC 7.9 3.3* 3.6* 2.0*   Microbiology Recent Results (from the past 240 hour(s))  Culture, blood (Routine X 2) w Reflex to ID Panel     Status: None (Preliminary result)   Collection Time: 03/16/23  3:46 PM   Specimen: BLOOD  Result Value Ref Range Status   Specimen Description   Final    BLOOD SITE NOT SPECIFIED Performed at [facility], [address] [place], [place] [address]    Special Requests   Final    BOTTLES DRAWN AEROBIC ONLY Blood Culture adequate volume Performed at [facility], [address] [place], [place] [address]    Culture   Final    NO GROWTH 3 DAYS Performed at [facility], [address], [place], [place] [address]    Report Status PENDING  Incomplete  Culture, blood (Routine X 2) w Reflex to ID Panel     Status: None (Preliminary result)   Collection Time: 03/16/23  3:46 PM   Specimen: BLOOD  Result Value Ref Range Status   Specimen Description   Final    BLOOD SITE NOT SPECIFIED Performed at [facility], [address], [place], [place] [address]    Special Requests   Final    BOTTLES DRAWN AEROBIC ONLY Blood Culture adequate volume Performed at [facility]  [address], [place], [place] [address]    Culture   Final    NO GROWTH 3 DAYS Performed at [facility], [address], [place], [place] [address]    Report Status PENDING  Incomplete     Time coordinating discharge: Over 30 minutes  SIGNED:   Azucena Fallen, DO [facility] 03/19/2023, 11:52 AM Pager   If 7PM-7AM, please contact night-coverage [email or web address]

## 2023-03-19 NOTE — Transfer of Care (Signed)
Immediate Anesthesia Transfer of Care Note  Patient: [name]  Procedure(s) Performed: ENDOSCOPIC RETROGRADE CHOLANGIOPANCREATOGRAPHY (ERCP)  Patient Location: PACU  Anesthesia Type:General  Level of Consciousness: sedated  Airway & Oxygen Therapy: Patient Spontanous Breathing and Patient connected to face mask oxygen  Post-op Assessment: Report given to RN and Post -op Vital signs reviewed and stable  Post vital signs: Reviewed and stable  Last Vitals:  Vitals Value Taken Time  BP    Temp    Pulse 71 03/19/23 0832  Resp 0 03/19/23 0832  SpO2 100 % 03/19/23 0832  Vitals shown include unvalidated device data.  Last Pain:  Vitals:   03/19/23 0700  TempSrc: Tympanic  PainSc: 0-No pain      Patients Stated Pain Goal: 0 (03/18/23 2120)  Complications: No notable events documented.

## 2023-03-21 ENCOUNTER — Ambulatory Visit: Payer: Self-pay

## 2023-03-21 ENCOUNTER — Telehealth: Payer: Self-pay | Admitting: *Deleted

## 2023-03-21 ENCOUNTER — Encounter: Payer: Self-pay | Admitting: *Deleted

## 2023-03-21 LAB — CULTURE, BLOOD (ROUTINE X 2): Culture: NO GROWTH

## 2023-03-21 NOTE — Chronic Care Management (AMB) (Signed)
   03/21/2023  [name] 08/01/1946 [id number]    Reason for Encounter: Patient is not currently enrolled in the CCM program. CCM status changed to previously enrolled.   Katha Cabal RN Care Manager/Chronic Care Management [phone]

## 2023-03-21 NOTE — Transitions of Care (Post Inpatient/ED Visit) (Signed)
   03/21/2023  Name: [name] MRN: [id number] DOB: November 29, 1945  Today's TOC FU Call Status: Today's TOC FU Call Status:: Unsuccessul Call (1st Attempt) Unsuccessful Call (1st Attempt) Date: 03/21/23  Attempted to reach the patient regarding the most recent Inpatient visit; left HIPAA compliant voice message requesting call back  Follow Up Plan: Additional outreach attempts will be made to reach the patient to complete the Transitions of Care (Post Inpatient visit) call.   Caryl Pina, RN, BSN, CCRN Alumnus RN CM Care Coordination/ Transition of Care- [facility] Care Management [phone]: direct office

## 2023-03-22 ENCOUNTER — Telehealth: Payer: Self-pay | Admitting: *Deleted

## 2023-03-22 ENCOUNTER — Encounter (HOSPITAL_COMMUNITY): Payer: Self-pay | Admitting: Gastroenterology

## 2023-03-22 NOTE — Transitions of Care (Post Inpatient/ED Visit) (Signed)
03/22/2023  Name: [name] MRN: [id number] DOB: [birth year]  Today's TOC FU Call Status: Today's TOC FU Call Status:: Successful TOC FU Call Competed TOC FU Call Complete Date: 03/22/23  Transition Care Management Follow-up Telephone Call Date of Discharge: 03/19/23 Discharge Facility: [facility]) Type of Discharge: Inpatient Admission Primary Inpatient Discharge Diagnosis:: hyperbilirubinemia/ pancreatic CA; biliary stent migration with replacement How have you been since you were released from the hospital?: Better ("I am doing fine right now; much better.  I am managing my own and my wife's medical issues, I really don't [name] a bunch of phone calls or appointments.  I will call you if I need help.  I am driving now and can't review my medicines.") Any questions or concerns?: No  Items Reviewed: Did you receive and understand the discharge instructions provided?: Yes (briefly reviewed with patient who verbalizes good understanding of same) Medications obtained,verified, and reconciled?: No (patient declined medication review- he is driving; assures me he has no medication concerns and is taking all meds as prescribed) Medications Not Reviewed Reasons:: Other: (patient declined medication review- he is driving; assures me he has no medication concerns and is taking all meds as prescribed) Any new allergies since your discharge?: No Dietary orders reviewed?: No (patient declined this part of TOC call-- he is currently driving) Do you have support at home?: Yes People in Home: spouse Name of Support/Comfort Primary Source: Reports independent in self-care activities; spouse assists as/ if needed/ indicated  Medications Reviewed Today: Medications Reviewed Today     Reviewed by Michaela Corner, RN (Registered Nurse) on 03/22/23 at 1510  Med List Status: <None>   Medication Order Taking? Sig Documenting Provider Last Dose Status Informant  ACCU-CHEK GUIDE test strip  [id number] No USE TO check blood sugar DAILY AS DIRECTED Burns, Bobette Mo, MD Taking Active Self, Pharmacy Records  acetaminophen (TYLENOL) 650 MG CR tablet [id number] No Take 1,300 mg by mouth as needed for pain. [provider] 03/16/2023 Active Self, Pharmacy Records  albuterol (VENTOLIN HFA) 108 (90 Base) MCG/ACT inhaler [id number] No Inhale 2 puffs into the lungs every 6 (six) hours as needed for wheezing or shortness of breath. Inhale 2 puffs in the lungs every 4 hours as needed for cough, wheezing, SOB.  Patient taking differently: Inhale 2 puffs into the lungs every 6 (six) hours as needed for wheezing or shortness of breath.   [name], MD unk Active Self, Pharmacy Records           Med Note (CRUTHIS, Con Memos Mar 16, 2023  2:59 PM) Pt is unsure of last dose.    finasteride (PROSCAR) 5 MG tablet [id number] No Take 5 mg by mouth daily. [provider] 03/16/2023 Active Self, Pharmacy Records  ibuprofen (ADVIL) 400 MG tablet [id number]  Take 1 tablet (400 mg total) by mouth every 6 (six) hours as needed for mild pain, fever or moderate pain. Azucena Fallen, MD  Active   Lancets MISC [id number] No Test blood sugar daily. Dx code: 42.00 Nelva Nay, PA-C Taking Active Self, Pharmacy Records  lidocaine-prilocaine (EMLA) cream [id number] No Apply 1 Application topically as needed.  Patient taking differently: Apply 1 Application topically as needed (port access).   Heilingoetter, Dossie Arbour 03/16/2023 Active Self, Pharmacy Records           Med Note (CRUTHIS, Marcy Siren   Wed Mar 16, 2023  2:54 PM)    ondansetron [facility]) 4  MG tablet [id number]  Take 1 tablet (4 mg total) by mouth every 6 (six) hours as needed for nausea. Azucena Fallen, MD  Active   prochlorperazine (COMPAZINE) 10 MG tablet [id number] No Take 1 tablet (10 mg total) by mouth every 6 (six) hours as needed. Heilingoetter, Johnette Abraham, PA-C unk Active Self, Pharmacy Records           Med Note  (CRUTHIS, Marcy Siren   Wed Mar 16, 2023  2:58 PM) Pt is unsure of last dose.   silodosin (RAPAFLO) 8 MG CAPS capsule [id number] No Take 8 mg by mouth daily. [provider] 03/16/2023 Active Self, Pharmacy Records  simvastatin (ZOCOR) 20 MG tablet [id number] No TAKE ONE TABLET BY MOUTH EVERYDAY AT BEDTIME  Patient taking differently: Take 20 mg by mouth at bedtime.   [name], MD 03/15/2023 Active Self, Pharmacy Records           Med Note Epimenio Sarin, Con Memos Mar 16, 2023  2:59 PM) LF 06/23 for 90 DS. Pt is adamant he is still taking this medication QHS. Dispense report does not support this claim.             Home Care and Equipment/Supplies: Were Home Health Services Ordered?: No Any new equipment or medical supplies ordered?: No  Functional Questionnaire: Do you need assistance with bathing/showering or dressing?: No Do you need assistance with meal preparation?: No Do you need assistance with eating?: No Do you have difficulty maintaining continence: No Do you need assistance with getting out of bed/getting out of a chair/moving?: No Do you have difficulty managing or taking your medications?: No  Follow up appointments reviewed: PCP Follow-up appointment confirmed?: No (verified well-established with current PCP- he reports he does not [name] assistance today in scheduling; stated he will schedule himself as soon as he able) MD Provider Line Number:[phone] Given: No (verified well-established with current PCP) Specialist Hospital Follow-up appointment confirmed?: Yes Date of Specialist follow-up appointment?: 03/30/23 Follow-Up Specialty Provider:: oncology provider Do you need transportation to your follow-up appointment?: No Do you understand care options if your condition(s) worsen?: Yes-patient verbalized understanding  SDOH Interventions Today    Flowsheet Row Most Recent Value  SDOH Interventions   Food Insecurity Interventions Intervention Not Indicated   Transportation Interventions Intervention Not Indicated  [drives self]      TOC Interventions Today    Flowsheet Row Most Recent Value  TOC Interventions   TOC Interventions Discussed/Reviewed TOC Interventions Discussed  [Patient declines need for ongoing/ further care coordination outreach,  no care coordination needs identified at time of TOC call today,  provided my direct contact information should questions/ concerns/ needs arise post-TOC call]      Interventions Today    Flowsheet Row Most Recent Value  Chronic Disease   Chronic disease during today's visit Other  [pancreatic CA]  General Interventions   General Interventions Discussed/Reviewed General Interventions Discussed, Doctor Visits  Doctor Visits Discussed/Reviewed Specialist, Doctor Visits Discussed, PCP  PCP/Specialist Visits Compliance with follow-up visit  Nutrition Interventions   Nutrition Discussed/Reviewed Nutrition Discussed  Pharmacy Interventions   Pharmacy Dicussed/Reviewed Pharmacy Topics Discussed      Caryl Pina, RN, BSN, CCRN Alumnus RN CM Care Coordination/ Transition of Care- [facility] Care Management [phone]: direct office

## 2023-03-23 ENCOUNTER — Other Ambulatory Visit: Payer: Self-pay

## 2023-03-24 ENCOUNTER — Other Ambulatory Visit: Payer: Self-pay
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
## 2023-03-29 NOTE — Assessment & Plan Note (Addendum)
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 [name] showed 1.5 cm in pancreatic head, cytology confirmed adenocarcinoma.  An uncovered metal stent was placed in the common bile duct by Dr. Ewing Schlein -he completed 3 months neoadjuvant FOLFIRINOX on 06/08/22 - 09/09/22, he tolerated well. Chemo changed to FOLFIRI with liposomal irinotecan afterward  -restaging CT AP on 09/02/22 showed similar small amount of abnormal soft tissue adjacent to common bile duct stent (which is appropriately located), otherwise no new lesions or definitive signs of metastatic disease.  -We reviewed his case in GI tumor conference, unfortunately due to the tumor invasion of hepatic artery, our surgeons feel his pancreatic cancer is not resectable.  -His recent restaging CT scan at [facility] showed persistent tumor invasion of hepatic artery, Dr. [name] recommend SBRT, he completed at [facility]  -He was recently hospitalized for hyperbilirubinemia due to stent occlusion, and underwent ERCP again and stent exchange.  MRCP was done in the hospital, which showed no discrete pancreatic mass or evidence of metastasis. -He is scheduled to follow-up with Dr. [name] later this week, and surgery on Apr 14, 2023.

## 2023-03-30 ENCOUNTER — Encounter: Payer: Self-pay | Admitting: Hematology

## 2023-03-30 ENCOUNTER — Inpatient Hospital Stay: Payer: Medicare Other

## 2023-03-30 ENCOUNTER — Inpatient Hospital Stay (HOSPITAL_BASED_OUTPATIENT_CLINIC_OR_DEPARTMENT_OTHER): Payer: Medicare Other | Admitting: Hematology

## 2023-03-30 ENCOUNTER — Encounter: Payer: Self-pay | Admitting: Internal Medicine

## 2023-03-30 VITALS — BP 140/60 | HR 73 | Temp 98.3°F | Resp 18 | Ht 68.0 in | Wt 207.2 lb

## 2023-03-30 DIAGNOSIS — C250 Malignant neoplasm of head of pancreas: Secondary | ICD-10-CM | POA: Insufficient documentation

## 2023-03-30 DIAGNOSIS — C259 Malignant neoplasm of pancreas, unspecified: Secondary | ICD-10-CM | POA: Diagnosis not present

## 2023-03-30 DIAGNOSIS — Z95828 Presence of other vascular implants and grafts: Secondary | ICD-10-CM

## 2023-03-30 LAB — CMP (CANCER CENTER ONLY)
ALT: 38 U/L (ref 0–44)
AST: 47 U/L — ABNORMAL HIGH (ref 15–41)
Albumin: 3.6 g/dL (ref 3.5–5.0)
Alkaline Phosphatase: 226 U/L — ABNORMAL HIGH (ref 38–126)
Anion gap: 5 (ref 5–15)
BUN: 14 mg/dL (ref 8–23)
CO2: 29 mmol/L (ref 22–32)
Calcium: 9 mg/dL (ref 8.9–10.3)
Chloride: 105 mmol/L (ref 98–111)
Creatinine: 0.95 mg/dL (ref 0.61–1.24)
GFR, Estimated: >60 mL/min (ref >60)
Glucose, Bld: 122 mg/dL — ABNORMAL HIGH (ref 70–99)
Potassium: 3.9 mmol/L (ref 3.5–5.1)
Sodium: 139 mmol/L (ref 135–145)
Total Bilirubin: 1.5 mg/dL — ABNORMAL HIGH (ref 0.3–1.2)
Total Protein: 6.4 g/dL — ABNORMAL LOW (ref 6.5–8.1)

## 2023-03-30 LAB — CBC WITH DIFFERENTIAL (CANCER CENTER ONLY)
Abs Immature Granulocytes: 0.02 10*3/uL (ref 0.00–0.07)
Basophils Absolute: 0 10*3/uL (ref 0.0–0.1)
Basophils Relative: 0 %
Eosinophils Absolute: 0 10*3/uL (ref 0.0–0.5)
Eosinophils Relative: 1 %
HCT: 32.4 % — ABNORMAL LOW (ref 39.0–52.0)
Hemoglobin: 11.1 g/dL — ABNORMAL LOW (ref 13.0–17.0)
Immature Granulocytes: 1 %
Lymphocytes Relative: 10 %
Lymphs Abs: 0.4 10*3/uL — ABNORMAL LOW (ref 0.7–4.0)
MCH: 29.8 pg (ref 26.0–34.0)
MCHC: 34.3 g/dL (ref 30.0–36.0)
MCV: 87.1 fL (ref 80.0–100.0)
Monocytes Absolute: 0.5 10*3/uL (ref 0.1–1.0)
Monocytes Relative: 13 %
Neutro Abs: 3.3 10*3/uL (ref 1.7–7.7)
Neutrophils Relative %: 75 %
Platelet Count: 112 10*3/uL — ABNORMAL LOW (ref 150–400)
RBC: 3.72 MIL/uL — ABNORMAL LOW (ref 4.22–5.81)
RDW: 14.3 % (ref 11.5–15.5)
WBC Count: 4.3 10*3/uL (ref 4.0–10.5)
nRBC: 0 % (ref 0.0–0.2)

## 2023-03-30 MED ORDER — SODIUM CHLORIDE 0.9% FLUSH
10.0000 mL | Freq: Once | INTRAVENOUS | Status: AC
  Administered 2023-03-30: 10 mL

## 2023-03-30 MED ORDER — HEPARIN SOD (PORK) LOCK FLUSH 100 UNIT/ML IV SOLN
500.0000 [IU] | Freq: Once | INTRAVENOUS | Status: AC
  Administered 2023-03-30: 500 [IU]

## 2023-03-30 MED ORDER — PANTOPRAZOLE SODIUM 20 MG PO TBEC: 20.0000 mg | DELAYED_RELEASE_TABLET | Freq: Every day | ORAL | 2 refills | Status: DC

## 2023-03-30 NOTE — Patient Instructions (Addendum)
       Medications changes include :   none       Return in about 6 months (around 10/01/2023) for follow up.

## 2023-03-30 NOTE — Progress Notes (Unsigned)
Subjective:    Patient ID: [name], male    DOB: 07/24/1946, 77 y.o.   MRN: [id number]     HPI Demarian is here for follow up from the hospital.   Admitted 5/1-5/4.   He went to the ED at the request of his oncologist.  LFTs including bilirubin were elevated at his routine appointment-bilirubin 5.9, AST 197, ALT 143, alk phos 456.  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  He did note loose stools but it changed to a lighter green color and darkening of his urine as well and is not feeling well.  He had chills at time, but no abdominal pain nausea or vomiting.  Patient admitted for likely biliary tree obstruction.  MRCP confirmed good position of previously placed biliary stent with proximal duct dilation suggestive of stent occlusion.  GI consulted.  Patient had ERCP and afterwards noted to have migration of biliary stent which had been placed during the procedure without incidence.  Tolerated procedure well and discharged home.  Likely to have Whipple procedure 5/30 at [facility].  Before discharge LFTs much improved, WBC normal.   He is doing well - said he felt fine when he went into the hospital and feels fine now.  He has no concerns.  His appetite is good.  He feels his energy is good.   He denies abdominal pain, bowels are ok - no nausea.     Medications and allergies reviewed with patient and updated if appropriate.  Current Outpatient Medications on File Prior to Visit  Medication Sig Dispense Refill   ACCU-CHEK GUIDE test strip USE TO check blood sugar DAILY AS DIRECTED 100 strip 3   acetaminophen (TYLENOL) 650 MG CR tablet Take 1,300 mg by mouth as needed for pain.     albuterol (VENTOLIN HFA) 108 (90 Base) MCG/ACT inhaler Inhale 2 puffs into the lungs every 6 (six) hours as needed for wheezing or shortness of breath. Inhale 2 puffs in the lungs every 4 hours as needed for cough, wheezing, SOB. (Patient taking differently: Inhale 2 puffs  into the lungs every 6 (six) hours as needed for wheezing or shortness of breath.) 18 g 11   finasteride (PROSCAR) 5 MG tablet Take 5 mg by mouth daily.     ibuprofen (ADVIL) 400 MG tablet Take 1 tablet (400 mg total) by mouth every 6 (six) hours as needed for mild pain, fever or moderate pain. 30 tablet 0   Lancets MISC Test blood sugar daily. Dx code: 250.00 100 each 3   lidocaine-prilocaine (EMLA) cream Apply 1 Application topically as needed. (Patient taking differently: Apply 1 Application topically as needed (port access).) 30 g 2   ondansetron (ZOFRAN) 4 MG tablet Take 1 tablet (4 mg total) by mouth every 6 (six) hours as needed for nausea. 20 tablet 0   prochlorperazine (COMPAZINE) 10 MG tablet Take 1 tablet (10 mg total) by mouth every 6 (six) hours as needed. 30 tablet 2   silodosin (RAPAFLO) 8 MG CAPS capsule Take 8 mg by mouth daily.     No current facility-administered medications on file prior to visit.     Review of Systems  Constitutional:  Negative for appetite change, chills, fatigue and fever.  Respiratory:  Negative for cough, shortness of breath and wheezing.   Cardiovascular:  Positive for leg swelling (mild, stable). Negative for chest pain and palpitations.  Gastrointestinal:  Positive for constipation (occ). Negative for abdominal  pain, blood in stool (no melena), diarrhea and nausea.  Neurological:  Negative for light-headedness and headaches.       Objective:   Vitals:   03/31/23 0759 03/31/23 0828  BP: (!) 140/72 134/68  Pulse: 64   Temp: 98 F (36.7 C)   SpO2: 98%    BP Readings from Last 3 Encounters:  03/31/23 134/68  03/30/23 (!) 140/60  03/19/23 (!) 144/65   Wt Readings from Last 3 Encounters:  03/31/23 207 lb (93.9 kg)  03/30/23 207 lb 3.2 oz (94 kg)  03/16/23 207 lb 3.7 oz (94 kg)   Body mass index is 31.47 kg/m.    Physical Exam Constitutional:      General: He is not in acute distress.    Appearance: Normal appearance. He is not  ill-appearing.  HENT:     Head: Normocephalic and atraumatic.  Eyes:     Conjunctiva/sclera: Conjunctivae normal.  Cardiovascular:     Rate and Rhythm: Normal rate and regular rhythm.     Heart sounds: Normal heart sounds.  Pulmonary:     Effort: Pulmonary effort is normal. No respiratory distress.     Breath sounds: Normal breath sounds. No wheezing or rales.  Abdominal:     General: There is no distension.     Palpations: Abdomen is soft.     Tenderness: There is no abdominal tenderness. There is no guarding or rebound.  Musculoskeletal:     Right lower leg: No edema.     Left lower leg: No edema.  Skin:    General: Skin is warm and dry.     Findings: No rash.  Neurological:     Mental Status: He is alert. Mental status is at baseline.  Psychiatric:        Mood and Affect: Mood normal.        Lab Results  Component Value Date   WBC 4.3 03/30/2023   HGB 11.1 (L) 03/30/2023   HCT 32.4 (L) 03/30/2023   PLT 112 (L) 03/30/2023   GLUCOSE 122 (H) 03/30/2023   CHOL 119 02/04/2022   TRIG 77.0 02/04/2022   HDL 40.20 02/04/2022   LDLCALC 64 02/04/2022   ALT 38 03/30/2023   AST 47 (H) 03/30/2023   NA 139 03/30/2023   K 3.9 03/30/2023   CL 105 03/30/2023   CREATININE 0.95 03/30/2023   BUN 14 03/30/2023   CO2 29 03/30/2023   TSH 3.00 07/31/2019   PSA 1.09 07/22/2017   INR 0.9 06/03/2022   HGBA1C 5.7 (H) 06/03/2022   MICROALBUR 0.3 11/25/2015     Assessment & Plan:    See Problem List for Assessment and Plan of chronic medical problems.

## 2023-03-31 ENCOUNTER — Other Ambulatory Visit: Payer: Self-pay

## 2023-03-31 ENCOUNTER — Ambulatory Visit: Payer: Medicare Other | Admitting: Internal Medicine

## 2023-03-31 VITALS — BP 134/68 | HR 64 | Temp 98.0°F | Ht 68.0 in | Wt 207.0 lb

## 2023-03-31 DIAGNOSIS — K831 Obstruction of bile duct: Secondary | ICD-10-CM

## 2023-03-31 DIAGNOSIS — E785 Hyperlipidemia, unspecified: Secondary | ICD-10-CM | POA: Diagnosis not present

## 2023-03-31 DIAGNOSIS — R7303 Prediabetes: Secondary | ICD-10-CM | POA: Diagnosis not present

## 2023-03-31 DIAGNOSIS — R7401 Elevation of levels of liver transaminase levels: Secondary | ICD-10-CM | POA: Diagnosis not present

## 2023-03-31 DIAGNOSIS — K219 Gastro-esophageal reflux disease without esophagitis: Secondary | ICD-10-CM

## 2023-03-31 DIAGNOSIS — J4520 Mild intermittent asthma, uncomplicated: Secondary | ICD-10-CM | POA: Diagnosis not present

## 2023-03-31 MED ORDER — PANTOPRAZOLE SODIUM 20 MG PO TBEC: 20.0000 mg | DELAYED_RELEASE_TABLET | Freq: Every day | ORAL | 2 refills | Status: DC

## 2023-03-31 MED ORDER — SIMVASTATIN 20 MG PO TABS: 20.0000 mg | ORAL_TABLET | Freq: Every day | ORAL | 3 refills | Status: DC

## 2023-03-31 NOTE — Assessment & Plan Note (Addendum)
Admitted for transaminitis secondary to biliary stent obstruction ERCP performed a new stent placed, obstruction relieved LFTs improved prior to discharge To have Whipple procedure at [facility] 5/30 Saw oncology yesterday Blood work much improved

## 2023-03-31 NOTE — Assessment & Plan Note (Addendum)
Recent hospitalization for elevated LFTs, bilirubin related to bile duct stent obstruction that was placed 05/2022 S/p ERCP-new stent placed LFTs much improved yesterday when he saw oncology For Whipple procedure 5/30 at [facility]

## 2023-03-31 NOTE — Assessment & Plan Note (Signed)
Chronic Mild, intermittent Continue albuterol inhaler as needed - rarely uses

## 2023-03-31 NOTE — Assessment & Plan Note (Signed)
Chronic Lab Results  Component Value Date   HGBA1C 5.7 (H) 06/03/2022   Sugars controlled with diet

## 2023-03-31 NOTE — Assessment & Plan Note (Signed)
Chronic Continue simvastatin 20 mg daily 

## 2023-03-31 NOTE — Assessment & Plan Note (Signed)
Chronic ?GERD controlled ?Continue pantoprazole 20 mg daily ?

## 2023-04-01 ENCOUNTER — Other Ambulatory Visit: Payer: Self-pay

## 2023-04-01 DIAGNOSIS — K828 Other specified diseases of gallbladder: Secondary | ICD-10-CM | POA: Diagnosis not present

## 2023-04-01 DIAGNOSIS — Z01818 Encounter for other preprocedural examination: Secondary | ICD-10-CM | POA: Diagnosis not present

## 2023-04-01 DIAGNOSIS — Z79899 Other long term (current) drug therapy: Secondary | ICD-10-CM | POA: Diagnosis not present

## 2023-04-01 DIAGNOSIS — Z87891 Personal history of nicotine dependence: Secondary | ICD-10-CM | POA: Diagnosis not present

## 2023-04-01 DIAGNOSIS — C259 Malignant neoplasm of pancreas, unspecified: Secondary | ICD-10-CM | POA: Diagnosis not present

## 2023-04-01 DIAGNOSIS — D649 Anemia, unspecified: Secondary | ICD-10-CM | POA: Diagnosis not present

## 2023-04-01 DIAGNOSIS — C250 Malignant neoplasm of head of pancreas: Secondary | ICD-10-CM | POA: Diagnosis not present

## 2023-04-04 ENCOUNTER — Telehealth: Payer: Self-pay | Admitting: Internal Medicine

## 2023-04-04 NOTE — Telephone Encounter (Signed)
Pt called wanted let Dr. Lawerance Bach know that his order for imaging was denied and was ask to give [place] a call.

## 2023-04-05 NOTE — Telephone Encounter (Signed)
I am not sure what this is regarding??

## 2023-04-06 NOTE — Telephone Encounter (Signed)
I contacted patient on yesterday to figure out what he was referring to.  Last time something ordered was last year and it was from [place] which was explained to patient.  Will call him today and see if he can upload picture and send it so I can review it.

## 2023-04-14 DIAGNOSIS — M1990 Unspecified osteoarthritis, unspecified site: Secondary | ICD-10-CM | POA: Diagnosis not present

## 2023-04-14 DIAGNOSIS — N1831 Chronic kidney disease, stage 3a: Secondary | ICD-10-CM | POA: Diagnosis not present

## 2023-04-14 DIAGNOSIS — I129 Hypertensive chronic kidney disease with stage 1 through stage 4 chronic kidney disease, or unspecified chronic kidney disease: Secondary | ICD-10-CM | POA: Diagnosis not present

## 2023-04-14 DIAGNOSIS — K9189 Other postprocedural complications and disorders of digestive system: Secondary | ICD-10-CM | POA: Diagnosis not present

## 2023-04-14 DIAGNOSIS — J9600 Acute respiratory failure, unspecified whether with hypoxia or hypercapnia: Secondary | ICD-10-CM | POA: Diagnosis not present

## 2023-04-14 DIAGNOSIS — I748 Embolism and thrombosis of other arteries: Secondary | ICD-10-CM | POA: Diagnosis not present

## 2023-04-14 DIAGNOSIS — J45909 Unspecified asthma, uncomplicated: Secondary | ICD-10-CM | POA: Diagnosis not present

## 2023-04-14 DIAGNOSIS — D62 Acute posthemorrhagic anemia: Secondary | ICD-10-CM | POA: Diagnosis not present

## 2023-04-14 DIAGNOSIS — T8119XA Other postprocedural shock, initial encounter: Secondary | ICD-10-CM | POA: Diagnosis not present

## 2023-04-14 DIAGNOSIS — Z923 Personal history of irradiation: Secondary | ICD-10-CM | POA: Diagnosis not present

## 2023-04-14 DIAGNOSIS — K5989 Other specified functional intestinal disorders: Secondary | ICD-10-CM | POA: Diagnosis not present

## 2023-04-14 DIAGNOSIS — Z9049 Acquired absence of other specified parts of digestive tract: Secondary | ICD-10-CM | POA: Diagnosis not present

## 2023-04-14 DIAGNOSIS — K567 Ileus, unspecified: Secondary | ICD-10-CM | POA: Diagnosis not present

## 2023-04-14 DIAGNOSIS — I9581 Postprocedural hypotension: Secondary | ICD-10-CM | POA: Diagnosis not present

## 2023-04-14 DIAGNOSIS — C250 Malignant neoplasm of head of pancreas: Secondary | ICD-10-CM | POA: Diagnosis not present

## 2023-04-14 DIAGNOSIS — S35299A Unspecified injury of branches of celiac and mesenteric artery, initial encounter: Secondary | ICD-10-CM | POA: Diagnosis not present

## 2023-04-14 DIAGNOSIS — Z9221 Personal history of antineoplastic chemotherapy: Secondary | ICD-10-CM | POA: Diagnosis not present

## 2023-04-14 DIAGNOSIS — R600 Localized edema: Secondary | ICD-10-CM | POA: Diagnosis not present

## 2023-04-14 DIAGNOSIS — M25462 Effusion, left knee: Secondary | ICD-10-CM | POA: Diagnosis not present

## 2023-04-14 DIAGNOSIS — Z96652 Presence of left artificial knee joint: Secondary | ICD-10-CM | POA: Diagnosis not present

## 2023-04-14 DIAGNOSIS — J95821 Acute postprocedural respiratory failure: Secondary | ICD-10-CM | POA: Diagnosis not present

## 2023-04-14 DIAGNOSIS — R140 Abdominal distension (gaseous): Secondary | ICD-10-CM | POA: Diagnosis not present

## 2023-04-14 DIAGNOSIS — Z87891 Personal history of nicotine dependence: Secondary | ICD-10-CM | POA: Diagnosis not present

## 2023-04-14 DIAGNOSIS — G839 Paralytic syndrome, unspecified: Secondary | ICD-10-CM | POA: Diagnosis not present

## 2023-04-14 DIAGNOSIS — G8918 Other acute postprocedural pain: Secondary | ICD-10-CM | POA: Diagnosis not present

## 2023-04-14 DIAGNOSIS — I871 Compression of vein: Secondary | ICD-10-CM | POA: Diagnosis not present

## 2023-04-14 DIAGNOSIS — Z4682 Encounter for fitting and adjustment of non-vascular catheter: Secondary | ICD-10-CM | POA: Diagnosis not present

## 2023-04-14 DIAGNOSIS — C259 Malignant neoplasm of pancreas, unspecified: Secondary | ICD-10-CM | POA: Diagnosis not present

## 2023-04-14 DIAGNOSIS — Z9911 Dependence on respirator [ventilator] status: Secondary | ICD-10-CM | POA: Diagnosis not present

## 2023-04-14 DIAGNOSIS — Z4659 Encounter for fitting and adjustment of other gastrointestinal appliance and device: Secondary | ICD-10-CM | POA: Diagnosis not present

## 2023-04-14 DIAGNOSIS — K8689 Other specified diseases of pancreas: Secondary | ICD-10-CM | POA: Diagnosis not present

## 2023-04-14 DIAGNOSIS — K219 Gastro-esophageal reflux disease without esophagitis: Secondary | ICD-10-CM | POA: Diagnosis not present

## 2023-04-14 DIAGNOSIS — J9811 Atelectasis: Secondary | ICD-10-CM | POA: Diagnosis not present

## 2023-04-14 DIAGNOSIS — Z79899 Other long term (current) drug therapy: Secondary | ICD-10-CM | POA: Diagnosis not present

## 2023-04-14 DIAGNOSIS — X58XXXA Exposure to other specified factors, initial encounter: Secondary | ICD-10-CM | POA: Diagnosis not present

## 2023-04-14 NOTE — Telephone Encounter (Signed)
Left message for patient to call me back.  Mychart sent to him to upload copy of image.

## 2023-04-27 ENCOUNTER — Telehealth: Payer: Self-pay | Admitting: Internal Medicine

## 2023-04-27 DIAGNOSIS — I959 Hypotension, unspecified: Secondary | ICD-10-CM | POA: Diagnosis not present

## 2023-04-27 DIAGNOSIS — D62 Acute posthemorrhagic anemia: Secondary | ICD-10-CM | POA: Diagnosis not present

## 2023-04-27 DIAGNOSIS — M62422 Contracture of muscle, left upper arm: Secondary | ICD-10-CM | POA: Diagnosis not present

## 2023-04-27 DIAGNOSIS — N1831 Chronic kidney disease, stage 3a: Secondary | ICD-10-CM | POA: Diagnosis not present

## 2023-04-27 DIAGNOSIS — G8324 Monoplegia of upper limb affecting left nondominant side: Secondary | ICD-10-CM | POA: Diagnosis not present

## 2023-04-27 DIAGNOSIS — J45909 Unspecified asthma, uncomplicated: Secondary | ICD-10-CM | POA: Diagnosis not present

## 2023-04-27 DIAGNOSIS — I129 Hypertensive chronic kidney disease with stage 1 through stage 4 chronic kidney disease, or unspecified chronic kidney disease: Secondary | ICD-10-CM | POA: Diagnosis not present

## 2023-04-27 DIAGNOSIS — C251 Malignant neoplasm of body of pancreas: Secondary | ICD-10-CM | POA: Diagnosis not present

## 2023-04-27 DIAGNOSIS — Z48815 Encounter for surgical aftercare following surgery on the digestive system: Secondary | ICD-10-CM | POA: Diagnosis not present

## 2023-04-27 NOTE — Telephone Encounter (Signed)
Verbals left on voicemail today. 

## 2023-04-27 NOTE — Telephone Encounter (Signed)
Okay for orders? 

## 2023-04-27 NOTE — Telephone Encounter (Signed)
Caller & What Company:  Nino Glow with [facility]   Phone Number:  [phone] (secure)   Needs Verbal orders for what service & frequency:  Home Health  for 1 week1, 2 week 2, 1 week 1, 2week 2 and 1 week 1  also Occupational therapy evaluation

## 2023-05-03 DIAGNOSIS — Z7901 Long term (current) use of anticoagulants: Secondary | ICD-10-CM

## 2023-05-03 DIAGNOSIS — D649 Anemia, unspecified: Secondary | ICD-10-CM | POA: Diagnosis not present

## 2023-05-03 DIAGNOSIS — M62422 Contracture of muscle, left upper arm: Secondary | ICD-10-CM | POA: Diagnosis not present

## 2023-05-03 DIAGNOSIS — D62 Acute posthemorrhagic anemia: Secondary | ICD-10-CM | POA: Diagnosis not present

## 2023-05-03 DIAGNOSIS — J45909 Unspecified asthma, uncomplicated: Secondary | ICD-10-CM | POA: Diagnosis not present

## 2023-05-03 DIAGNOSIS — Z48815 Encounter for surgical aftercare following surgery on the digestive system: Secondary | ICD-10-CM | POA: Diagnosis not present

## 2023-05-03 DIAGNOSIS — Z7982 Long term (current) use of aspirin: Secondary | ICD-10-CM

## 2023-05-03 DIAGNOSIS — Z95828 Presence of other vascular implants and grafts: Secondary | ICD-10-CM | POA: Diagnosis not present

## 2023-05-03 DIAGNOSIS — G8324 Monoplegia of upper limb affecting left nondominant side: Secondary | ICD-10-CM | POA: Diagnosis not present

## 2023-05-03 DIAGNOSIS — N401 Enlarged prostate with lower urinary tract symptoms: Secondary | ICD-10-CM

## 2023-05-03 DIAGNOSIS — M1990 Unspecified osteoarthritis, unspecified site: Secondary | ICD-10-CM | POA: Diagnosis not present

## 2023-05-03 DIAGNOSIS — C251 Malignant neoplasm of body of pancreas: Secondary | ICD-10-CM | POA: Diagnosis not present

## 2023-05-03 DIAGNOSIS — R748 Abnormal levels of other serum enzymes: Secondary | ICD-10-CM | POA: Diagnosis not present

## 2023-05-03 DIAGNOSIS — Z79899 Other long term (current) drug therapy: Secondary | ICD-10-CM | POA: Diagnosis not present

## 2023-05-03 DIAGNOSIS — N1831 Chronic kidney disease, stage 3a: Secondary | ICD-10-CM | POA: Diagnosis not present

## 2023-05-03 DIAGNOSIS — R600 Localized edema: Secondary | ICD-10-CM | POA: Diagnosis not present

## 2023-05-03 DIAGNOSIS — C259 Malignant neoplasm of pancreas, unspecified: Secondary | ICD-10-CM | POA: Diagnosis not present

## 2023-05-03 DIAGNOSIS — Z483 Aftercare following surgery for neoplasm: Secondary | ICD-10-CM | POA: Diagnosis not present

## 2023-05-03 DIAGNOSIS — I129 Hypertensive chronic kidney disease with stage 1 through stage 4 chronic kidney disease, or unspecified chronic kidney disease: Secondary | ICD-10-CM | POA: Diagnosis not present

## 2023-05-03 DIAGNOSIS — I959 Hypotension, unspecified: Secondary | ICD-10-CM | POA: Diagnosis not present

## 2023-05-03 DIAGNOSIS — M7989 Other specified soft tissue disorders: Secondary | ICD-10-CM | POA: Diagnosis not present

## 2023-05-04 ENCOUNTER — Inpatient Hospital Stay: Payer: Medicare Other | Admitting: Internal Medicine

## 2023-05-04 ENCOUNTER — Telehealth: Payer: Self-pay | Admitting: Internal Medicine

## 2023-05-04 DIAGNOSIS — D62 Acute posthemorrhagic anemia: Secondary | ICD-10-CM | POA: Diagnosis not present

## 2023-05-04 DIAGNOSIS — G8324 Monoplegia of upper limb affecting left nondominant side: Secondary | ICD-10-CM | POA: Diagnosis not present

## 2023-05-04 DIAGNOSIS — I959 Hypotension, unspecified: Secondary | ICD-10-CM | POA: Diagnosis not present

## 2023-05-04 DIAGNOSIS — I129 Hypertensive chronic kidney disease with stage 1 through stage 4 chronic kidney disease, or unspecified chronic kidney disease: Secondary | ICD-10-CM | POA: Diagnosis not present

## 2023-05-04 DIAGNOSIS — J45909 Unspecified asthma, uncomplicated: Secondary | ICD-10-CM | POA: Diagnosis not present

## 2023-05-04 DIAGNOSIS — M62422 Contracture of muscle, left upper arm: Secondary | ICD-10-CM | POA: Diagnosis not present

## 2023-05-04 DIAGNOSIS — N1831 Chronic kidney disease, stage 3a: Secondary | ICD-10-CM | POA: Diagnosis not present

## 2023-05-04 DIAGNOSIS — Z48815 Encounter for surgical aftercare following surgery on the digestive system: Secondary | ICD-10-CM | POA: Diagnosis not present

## 2023-05-04 DIAGNOSIS — C251 Malignant neoplasm of body of pancreas: Secondary | ICD-10-CM | POA: Diagnosis not present

## 2023-05-04 NOTE — Telephone Encounter (Signed)
Rechetta from [facility] caled for an extension on pt's OT with a frequency of: 1X for 1 week through 6.23-6.29  Please call Rechetta to confirm:  [phone]

## 2023-05-05 NOTE — Telephone Encounter (Signed)
Okay for orders? 

## 2023-05-06 DIAGNOSIS — I959 Hypotension, unspecified: Secondary | ICD-10-CM | POA: Diagnosis not present

## 2023-05-06 DIAGNOSIS — C251 Malignant neoplasm of body of pancreas: Secondary | ICD-10-CM | POA: Diagnosis not present

## 2023-05-06 DIAGNOSIS — G8324 Monoplegia of upper limb affecting left nondominant side: Secondary | ICD-10-CM | POA: Diagnosis not present

## 2023-05-06 DIAGNOSIS — N1831 Chronic kidney disease, stage 3a: Secondary | ICD-10-CM | POA: Diagnosis not present

## 2023-05-06 DIAGNOSIS — Z48815 Encounter for surgical aftercare following surgery on the digestive system: Secondary | ICD-10-CM | POA: Diagnosis not present

## 2023-05-06 DIAGNOSIS — I129 Hypertensive chronic kidney disease with stage 1 through stage 4 chronic kidney disease, or unspecified chronic kidney disease: Secondary | ICD-10-CM | POA: Diagnosis not present

## 2023-05-06 DIAGNOSIS — D62 Acute posthemorrhagic anemia: Secondary | ICD-10-CM | POA: Diagnosis not present

## 2023-05-06 DIAGNOSIS — J45909 Unspecified asthma, uncomplicated: Secondary | ICD-10-CM | POA: Diagnosis not present

## 2023-05-06 DIAGNOSIS — M62422 Contracture of muscle, left upper arm: Secondary | ICD-10-CM | POA: Diagnosis not present

## 2023-05-06 NOTE — Telephone Encounter (Signed)
Tried calling to give verbals, phone kept ringing couldn't leave a VM

## 2023-05-09 DIAGNOSIS — I129 Hypertensive chronic kidney disease with stage 1 through stage 4 chronic kidney disease, or unspecified chronic kidney disease: Secondary | ICD-10-CM | POA: Diagnosis not present

## 2023-05-09 DIAGNOSIS — N1831 Chronic kidney disease, stage 3a: Secondary | ICD-10-CM | POA: Diagnosis not present

## 2023-05-09 DIAGNOSIS — G8324 Monoplegia of upper limb affecting left nondominant side: Secondary | ICD-10-CM | POA: Diagnosis not present

## 2023-05-09 DIAGNOSIS — Z48815 Encounter for surgical aftercare following surgery on the digestive system: Secondary | ICD-10-CM | POA: Diagnosis not present

## 2023-05-09 DIAGNOSIS — C251 Malignant neoplasm of body of pancreas: Secondary | ICD-10-CM | POA: Diagnosis not present

## 2023-05-09 DIAGNOSIS — I959 Hypotension, unspecified: Secondary | ICD-10-CM | POA: Diagnosis not present

## 2023-05-09 DIAGNOSIS — M62422 Contracture of muscle, left upper arm: Secondary | ICD-10-CM | POA: Diagnosis not present

## 2023-05-09 DIAGNOSIS — D62 Acute posthemorrhagic anemia: Secondary | ICD-10-CM | POA: Diagnosis not present

## 2023-05-09 DIAGNOSIS — J45909 Unspecified asthma, uncomplicated: Secondary | ICD-10-CM | POA: Diagnosis not present

## 2023-05-10 ENCOUNTER — Ambulatory Visit: Payer: Medicare Other | Admitting: Nurse Practitioner

## 2023-05-10 ENCOUNTER — Other Ambulatory Visit: Payer: Medicare Other

## 2023-05-10 DIAGNOSIS — C251 Malignant neoplasm of body of pancreas: Secondary | ICD-10-CM | POA: Diagnosis not present

## 2023-05-10 DIAGNOSIS — M62422 Contracture of muscle, left upper arm: Secondary | ICD-10-CM | POA: Diagnosis not present

## 2023-05-10 DIAGNOSIS — N1831 Chronic kidney disease, stage 3a: Secondary | ICD-10-CM | POA: Diagnosis not present

## 2023-05-10 DIAGNOSIS — J45909 Unspecified asthma, uncomplicated: Secondary | ICD-10-CM | POA: Diagnosis not present

## 2023-05-10 DIAGNOSIS — G8324 Monoplegia of upper limb affecting left nondominant side: Secondary | ICD-10-CM | POA: Diagnosis not present

## 2023-05-10 DIAGNOSIS — I129 Hypertensive chronic kidney disease with stage 1 through stage 4 chronic kidney disease, or unspecified chronic kidney disease: Secondary | ICD-10-CM | POA: Diagnosis not present

## 2023-05-10 DIAGNOSIS — D62 Acute posthemorrhagic anemia: Secondary | ICD-10-CM | POA: Diagnosis not present

## 2023-05-10 DIAGNOSIS — I959 Hypotension, unspecified: Secondary | ICD-10-CM | POA: Diagnosis not present

## 2023-05-10 DIAGNOSIS — Z48815 Encounter for surgical aftercare following surgery on the digestive system: Secondary | ICD-10-CM | POA: Diagnosis not present

## 2023-05-10 NOTE — Telephone Encounter (Signed)
2nd attempt calling for verbal orders. Not answering.

## 2023-05-12 DIAGNOSIS — I959 Hypotension, unspecified: Secondary | ICD-10-CM | POA: Diagnosis not present

## 2023-05-12 DIAGNOSIS — N1831 Chronic kidney disease, stage 3a: Secondary | ICD-10-CM | POA: Diagnosis not present

## 2023-05-12 DIAGNOSIS — C251 Malignant neoplasm of body of pancreas: Secondary | ICD-10-CM | POA: Diagnosis not present

## 2023-05-12 DIAGNOSIS — G8324 Monoplegia of upper limb affecting left nondominant side: Secondary | ICD-10-CM | POA: Diagnosis not present

## 2023-05-12 DIAGNOSIS — Z48815 Encounter for surgical aftercare following surgery on the digestive system: Secondary | ICD-10-CM | POA: Diagnosis not present

## 2023-05-12 DIAGNOSIS — J45909 Unspecified asthma, uncomplicated: Secondary | ICD-10-CM | POA: Diagnosis not present

## 2023-05-12 DIAGNOSIS — I129 Hypertensive chronic kidney disease with stage 1 through stage 4 chronic kidney disease, or unspecified chronic kidney disease: Secondary | ICD-10-CM | POA: Diagnosis not present

## 2023-05-12 DIAGNOSIS — M62422 Contracture of muscle, left upper arm: Secondary | ICD-10-CM | POA: Diagnosis not present

## 2023-05-12 DIAGNOSIS — D62 Acute posthemorrhagic anemia: Secondary | ICD-10-CM | POA: Diagnosis not present

## 2023-05-12 NOTE — Progress Notes (Signed)
Subjective:    Patient ID: [name], male    DOB: [birth year], 77 y.o.   MRN: [id number]      HPI [name] is here for No chief complaint on file.  Since after surgery 5/30 and discharged 6/2.  Feels like his bronchial tubes are stopped up.    Has a history of asthma.  He has been experiencing shortness of breath with ambulation-when at rest.  He denies chest pain, back pain, fever/chills.  He recently completed 5-day course of Lasix and is currently taking Augmentin.  He has albuterol inhaler, but wondered about a nebulizer.  The inhaler does not seem to be doing anything.  Also experiencing constipation.  He was advised to do to take Colace twice daily with the MiraLAX.    Leg edema -he had an ultrasound of his legs done at [facility] which was neg for dvt.  He is elevating his legs when sitting and laying-took lasix 20 mg x 5 days.  He did not see much improvement with the Lasix.  Medications and allergies reviewed with patient and updated if appropriate.  Current Outpatient Medications on File Prior to Visit  Medication Sig Dispense Refill   ACCU-CHEK GUIDE test strip USE TO check blood sugar DAILY AS DIRECTED 100 strip 3   acetaminophen (TYLENOL) 650 MG CR tablet Take 1,300 mg by mouth as needed for pain.     albuterol (VENTOLIN HFA) 108 (90 Base) MCG/ACT inhaler Inhale 2 puffs into the lungs every 6 (six) hours as needed for wheezing or shortness of breath. Inhale 2 puffs in the lungs every 4 hours as needed for cough, wheezing, SOB. (Patient taking differently: Inhale 2 puffs into the lungs every 6 (six) hours as needed for wheezing or shortness of breath.) 18 g 11   finasteride (PROSCAR) 5 MG tablet Take 5 mg by mouth daily.     ibuprofen (ADVIL) 400 MG tablet Take 1 tablet (400 mg total) by mouth every 6 (six) hours as needed for mild pain, fever or moderate pain. 30 tablet 0   Lancets MISC Test blood sugar daily. Dx code: 250.00 100 each 3   lidocaine-prilocaine (EMLA)  cream Apply 1 Application topically as needed. (Patient taking differently: Apply 1 Application topically as needed (port access).) 30 g 2   ondansetron (ZOFRAN) 4 MG tablet Take 1 tablet (4 mg total) by mouth every 6 (six) hours as needed for nausea. 20 tablet 0   pantoprazole (PROTONIX) 20 MG tablet Take 1 tablet (20 mg total) by mouth daily. 30 tablet 2   prochlorperazine (COMPAZINE) 10 MG tablet Take 1 tablet (10 mg total) by mouth every 6 (six) hours as needed. 30 tablet 2   silodosin (RAPAFLO) 8 MG CAPS capsule Take 8 mg by mouth daily.     simvastatin (ZOCOR) 20 MG tablet Take 1 tablet (20 mg total) by mouth at bedtime. 90 tablet 3   No current facility-administered medications on file prior to visit.    Review of Systems  Constitutional:  Negative for fever.  HENT:  Negative for congestion, sinus pain and sore throat.   Respiratory:  Positive for chest tightness (mild) and shortness of breath (with ambulating - not at rest). Negative for cough and wheezing.   Cardiovascular:  Positive for leg swelling. Negative for chest pain and palpitations.  Gastrointestinal:  Positive for nausea. Negative for abdominal pain.  Neurological:  Negative for light-headedness and headaches.       Objective:   Vitals:  05/13/23 0900  BP: 126/78  Pulse: 87  Temp: 98.1 F (36.7 C)  SpO2: 93%   BP Readings from Last 3 Encounters:  05/13/23 126/78  03/31/23 134/68  03/30/23 (!) 140/60   Wt Readings from Last 3 Encounters:  05/13/23 210 lb (95.3 kg)  03/31/23 207 lb (93.9 kg)  03/30/23 207 lb 3.2 oz (94 kg)   Body mass index is 31.93 kg/m.    Physical Exam Constitutional:      General: He is not in acute distress.    Appearance: Normal appearance. He is not ill-appearing.  HENT:     Head: Normocephalic and atraumatic.  Eyes:     Conjunctiva/sclera: Conjunctivae normal.  Cardiovascular:     Rate and Rhythm: Normal rate and regular rhythm.     Heart sounds: Normal heart sounds.   Pulmonary:     Effort: Pulmonary effort is normal. No respiratory distress.     Breath sounds: Normal breath sounds. No wheezing or rales.  Musculoskeletal:     Right lower leg: Edema (2+ pitting in lower leg, 1+ pitting posterior upper leg) present.     Left lower leg: Edema (2+ pitting in lower leg, 1+ pitting posterior upper leg) present.  Skin:    General: Skin is warm and dry.     Findings: No rash.  Neurological:     Mental Status: He is alert. Mental status is at baseline.  Psychiatric:        Mood and Affect: Mood normal.        DG Chest 2 View CLINICAL DATA:  Dyspnea on exertion.  EXAM: CHEST - 2 VIEW  COMPARISON:  Chest x-ray 06/07/2022.  FINDINGS: Right IJ approach Port-A-Cath with the tip projecting at the mid SVC similar. Visible pneumothorax. No consolidation. Cardiomediastinal silhouette is unchanged. Fixation of multiple left ribs.  IMPRESSION: No active cardiopulmonary disease.  Electronically Signed   By: Feliberto Harts M.D.   On: 05/13/2023 10:04      Assessment & Plan:    See Problem List for Assessment and Plan of chronic medical problems.

## 2023-05-13 ENCOUNTER — Ambulatory Visit (INDEPENDENT_AMBULATORY_CARE_PROVIDER_SITE_OTHER): Payer: Medicare Other | Admitting: Internal Medicine

## 2023-05-13 ENCOUNTER — Ambulatory Visit (INDEPENDENT_AMBULATORY_CARE_PROVIDER_SITE_OTHER): Payer: Medicare Other

## 2023-05-13 ENCOUNTER — Encounter: Payer: Self-pay | Admitting: Internal Medicine

## 2023-05-13 VITALS — BP 126/78 | HR 87 | Temp 98.1°F | Ht 68.0 in | Wt 210.0 lb

## 2023-05-13 DIAGNOSIS — R0609 Other forms of dyspnea: Secondary | ICD-10-CM | POA: Insufficient documentation

## 2023-05-13 DIAGNOSIS — R600 Localized edema: Secondary | ICD-10-CM | POA: Diagnosis not present

## 2023-05-13 DIAGNOSIS — C259 Malignant neoplasm of pancreas, unspecified: Secondary | ICD-10-CM | POA: Diagnosis not present

## 2023-05-13 DIAGNOSIS — J4520 Mild intermittent asthma, uncomplicated: Secondary | ICD-10-CM

## 2023-05-13 MED ORDER — POTASSIUM CHLORIDE CRYS ER 20 MEQ PO TBCR: 20.0000 meq | EXTENDED_RELEASE_TABLET | Freq: Every day | ORAL | 0 refills | Status: DC

## 2023-05-13 MED ORDER — FUROSEMIDE 40 MG PO TABS: 40.0000 mg | ORAL_TABLET | Freq: Every day | ORAL | 0 refills | Status: DC

## 2023-05-13 NOTE — Patient Instructions (Addendum)
      A chest xray was ordered    Medications changes include :   lasix 40 mg daily and potassium 20 meq daily.      Return for early - mid next week, follow up.

## 2023-05-13 NOTE — Assessment & Plan Note (Signed)
Chronic Mild, intermittent Asthma has always been well-controlled and he typically uses his albuterol inhaler as needed Albuterol inhaler has not helped with his current symptoms and I do not think he is having an asthma exacerbation-more of a episode of fluid overload related to hospitalization Can continue albuterol inhaler as needed-will hold off on nebulizer treatment at this time

## 2023-05-13 NOTE — Assessment & Plan Note (Signed)
New Since his hospitalization 5/30-6/2 he has been experiencing shortness of breath and bilateral leg edema I think he is fluid overloaded there may be an element of diastolic heart failure he did have grade 1 diastolic dysfunction previous echocardiogram We deferred blood work today because he is difficult to stick we cannot access his port-recent kidney function normal Chest x-ray did not reveal fluid overload Lasix 40 mg daily, potassium chloride 20 mill equivalents daily Follow-up next week

## 2023-05-17 NOTE — Progress Notes (Unsigned)
Subjective:    Patient ID: [name], male    DOB: 09-May-1946, 77 y.o.   MRN: [id number]     HPI [name] is here for follow up of his chronic medical problems.  Here for follow-up of leg swelling.  Last year 6/28-fluid overloaded with significant bilateral leg swelling.  Had shortness of breath with ambulation.  He was not sure if it was his asthma not, but using the albuterol inhaler and a course of Augmentin did not help.  He was fluid overloaded-?  Concern for diastolic dysfunction with heart failure.  Started on furosemide 40 mg daily and potassium 20 mill equivalents daily.  He has been elevating his legs a lot.  His swelling is better.  He is taking the lasix daily and potassium.  He is still have SOB - it is moderately better.  He is walking around the house.  He denies any chest pain, palpitations, coughing, wheezing or fevers.   Eating well.   Energy level is low - has improved since surgery.     Medications and allergies reviewed with patient and updated if appropriate.  Current Outpatient Medications on File Prior to Visit  Medication Sig Dispense Refill   ACCU-CHEK GUIDE test strip USE TO check blood sugar DAILY AS DIRECTED 100 strip 3   acetaminophen (TYLENOL) 650 MG CR tablet Take 1,300 mg by mouth as needed for pain.     albuterol (VENTOLIN HFA) 108 (90 Base) MCG/ACT inhaler Inhale 2 puffs into the lungs every 6 (six) hours as needed for wheezing or shortness of breath. Inhale 2 puffs in the lungs every 4 hours as needed for cough, wheezing, SOB. (Patient taking differently: Inhale 2 puffs into the lungs every 6 (six) hours as needed for wheezing or shortness of breath.) 18 g 11   finasteride (PROSCAR) 5 MG tablet Take 5 mg by mouth daily.     furosemide (LASIX) 40 MG tablet Take 1 tablet (40 mg total) by mouth daily. 30 tablet 0   ibuprofen (ADVIL) 400 MG tablet Take 1 tablet (400 mg total) by mouth every 6 (six) hours as needed for mild pain, fever or  moderate pain. 30 tablet 0   Lancets MISC Test blood sugar daily. Dx code: 250.00 100 each 3   lidocaine-prilocaine (EMLA) cream Apply 1 Application topically as needed. (Patient taking differently: Apply 1 Application topically as needed (port access).) 30 g 2   ondansetron (ZOFRAN) 4 MG tablet Take 1 tablet (4 mg total) by mouth every 6 (six) hours as needed for nausea. 20 tablet 0   pantoprazole (PROTONIX) 20 MG tablet Take 1 tablet (20 mg total) by mouth daily. 30 tablet 2   potassium chloride SA (KLOR-CON M) 20 MEQ tablet Take 1 tablet (20 mEq total) by mouth daily. 30 tablet 0   prochlorperazine (COMPAZINE) 10 MG tablet Take 1 tablet (10 mg total) by mouth every 6 (six) hours as needed. 30 tablet 2   silodosin (RAPAFLO) 8 MG CAPS capsule Take 8 mg by mouth daily.     simvastatin (ZOCOR) 20 MG tablet Take 1 tablet (20 mg total) by mouth at bedtime. 90 tablet 3   oxyCODONE (OXY IR/ROXICODONE) 5 MG immediate release tablet Take by mouth. (Patient not taking: Reported on 05/18/2023)     No current facility-administered medications on file prior to visit.     Review of Systems  Constitutional:  Negative for fever.  Respiratory:  Positive for shortness of breath. Negative for  cough and wheezing.   Cardiovascular:  Positive for leg swelling. Negative for chest pain and palpitations.  Neurological:  Negative for light-headedness and headaches.       Objective:   Vitals:   05/18/23 0808  BP: 124/80  Pulse: 89  Temp: 98.1 F (36.7 C)  SpO2: 99%   BP Readings from Last 3 Encounters:  05/18/23 124/80  05/13/23 126/78  03/31/23 134/68   Wt Readings from Last 3 Encounters:  05/18/23 199 lb (90.3 kg)  05/13/23 210 lb (95.3 kg)  03/31/23 207 lb (93.9 kg)   Body mass index is 30.26 kg/m.    Physical Exam Constitutional:      General: He is not in acute distress.    Appearance: Normal appearance. He is not ill-appearing.  HENT:     Head: Normocephalic and atraumatic.  Eyes:      Conjunctiva/sclera: Conjunctivae normal.  Cardiovascular:     Rate and Rhythm: Normal rate and regular rhythm.     Heart sounds: Normal heart sounds.  Pulmonary:     Effort: Pulmonary effort is normal. No respiratory distress.     Breath sounds: Normal breath sounds. No wheezing or rales.  Musculoskeletal:     Right lower leg: Edema (mild edema, non-pitting) present.     Left lower leg: Edema (mild edema, non-pitting) present.  Skin:    General: Skin is warm and dry.     Findings: No rash.  Neurological:     Mental Status: He is alert. Mental status is at baseline.  Psychiatric:        Mood and Affect: Mood normal.        Lab Results  Component Value Date   WBC 4.3 03/30/2023   HGB 11.1 (L) 03/30/2023   HCT 32.4 (L) 03/30/2023   PLT 112 (L) 03/30/2023   GLUCOSE 122 (H) 03/30/2023   CHOL 119 02/04/2022   TRIG 77.0 02/04/2022   HDL 40.20 02/04/2022   LDLCALC 64 02/04/2022   ALT 38 03/30/2023   AST 47 (H) 03/30/2023   NA 139 03/30/2023   K 3.9 03/30/2023   CL 105 03/30/2023   CREATININE 0.95 03/30/2023   BUN 14 03/30/2023   CO2 29 03/30/2023   TSH 3.00 07/31/2019   PSA 1.09 07/22/2017   INR 0.9 06/03/2022   HGBA1C 5.7 (H) 06/03/2022   MICROALBUR 0.3 11/25/2015     Assessment & Plan:    See Problem List for Assessment and Plan of chronic medical problems.

## 2023-05-17 NOTE — Patient Instructions (Signed)
      Blood work was ordered.   The lab is on the first floor.    Medications changes include :       A referral was ordered and someone will call you to schedule an appointment.     No follow-ups on file.  

## 2023-05-18 ENCOUNTER — Encounter: Payer: Self-pay | Admitting: Internal Medicine

## 2023-05-18 ENCOUNTER — Ambulatory Visit (INDEPENDENT_AMBULATORY_CARE_PROVIDER_SITE_OTHER): Payer: Medicare Other | Admitting: Internal Medicine

## 2023-05-18 VITALS — BP 124/80 | HR 89 | Temp 98.1°F | Ht 68.0 in | Wt 199.0 lb

## 2023-05-18 DIAGNOSIS — R600 Localized edema: Secondary | ICD-10-CM | POA: Diagnosis not present

## 2023-05-18 DIAGNOSIS — R0609 Other forms of dyspnea: Secondary | ICD-10-CM | POA: Diagnosis not present

## 2023-05-18 NOTE — Assessment & Plan Note (Signed)
Subacute Started after surgery was related to fluid overload Improved with Lasix 40 mg daily Shortness of breath also improved Still has edema Continue Lasix 40 mg daily, potassium 20 mg daily He will monitor his swelling

## 2023-05-18 NOTE — Assessment & Plan Note (Addendum)
Subacute Improved compared to last week with the lasix 40 mg daily, potassium Likely multifactorial- Fluid overload which has improved with the Lasix, anemia-hemoglobin about 2 weeks ago was 7, deconditioning.  He may also have some diastolic heart failure. Had ultrasounds of both legs at [facility] in June and there is no DVT, oxygen level is good so I do not think there is pulmonary embolism Chest x-ray is clear I do not think his asthma is contributing Encouraged him to continue to walk inside the house is much as possible to help build up his stamina and his energy Get repeat blood work with oncology -Unfortunately I do not think we will be able to get blood work from him since we cannot access the port -will review blood work rheumatology gets at his next appointment Continue Lasix 40 mg daily and potassium 20 mill equivalents daily ? Element of diastolic HF

## 2023-05-20 ENCOUNTER — Telehealth: Payer: Self-pay | Admitting: Internal Medicine

## 2023-05-20 NOTE — Telephone Encounter (Signed)
Message left for [name] today to d/c therapy.

## 2023-05-20 NOTE — Telephone Encounter (Signed)
Yes, if patient does not want it

## 2023-05-20 NOTE — Telephone Encounter (Signed)
[name] from [facility] called and said patient said they would like to discontinue his PT. He said he doesn't feel that he needs it anymore. Best callback for [name] is [phone]([email or web address]).

## 2023-05-31 DIAGNOSIS — I129 Hypertensive chronic kidney disease with stage 1 through stage 4 chronic kidney disease, or unspecified chronic kidney disease: Secondary | ICD-10-CM | POA: Diagnosis not present

## 2023-05-31 DIAGNOSIS — G8324 Monoplegia of upper limb affecting left nondominant side: Secondary | ICD-10-CM | POA: Diagnosis not present

## 2023-05-31 DIAGNOSIS — M62422 Contracture of muscle, left upper arm: Secondary | ICD-10-CM | POA: Diagnosis not present

## 2023-05-31 DIAGNOSIS — D62 Acute posthemorrhagic anemia: Secondary | ICD-10-CM | POA: Diagnosis not present

## 2023-05-31 DIAGNOSIS — I959 Hypotension, unspecified: Secondary | ICD-10-CM | POA: Diagnosis not present

## 2023-05-31 DIAGNOSIS — N1831 Chronic kidney disease, stage 3a: Secondary | ICD-10-CM | POA: Diagnosis not present

## 2023-05-31 DIAGNOSIS — Z48815 Encounter for surgical aftercare following surgery on the digestive system: Secondary | ICD-10-CM | POA: Diagnosis not present

## 2023-05-31 DIAGNOSIS — C251 Malignant neoplasm of body of pancreas: Secondary | ICD-10-CM | POA: Diagnosis not present

## 2023-05-31 DIAGNOSIS — J45909 Unspecified asthma, uncomplicated: Secondary | ICD-10-CM | POA: Diagnosis not present

## 2023-06-04 ENCOUNTER — Other Ambulatory Visit: Payer: Self-pay | Admitting: Internal Medicine

## 2023-06-24 ENCOUNTER — Other Ambulatory Visit: Payer: Self-pay

## 2023-06-24 DIAGNOSIS — C259 Malignant neoplasm of pancreas, unspecified: Secondary | ICD-10-CM
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
## 2023-06-27 ENCOUNTER — Inpatient Hospital Stay: Payer: Medicare Other | Admitting: Nurse Practitioner

## 2023-06-27 ENCOUNTER — Inpatient Hospital Stay: Payer: Medicare Other | Attending: Hematology

## 2023-06-27 ENCOUNTER — Encounter: Payer: Self-pay | Admitting: Nurse Practitioner

## 2023-06-27 ENCOUNTER — Other Ambulatory Visit: Payer: Self-pay

## 2023-06-27 VITALS — BP 111/58 | HR 68 | Temp 97.7°F | Resp 16 | Ht 68.0 in | Wt 193.0 lb

## 2023-06-27 DIAGNOSIS — C259 Malignant neoplasm of pancreas, unspecified: Secondary | ICD-10-CM | POA: Diagnosis not present

## 2023-06-27 DIAGNOSIS — G620 Drug-induced polyneuropathy: Secondary | ICD-10-CM | POA: Diagnosis not present

## 2023-06-27 DIAGNOSIS — C250 Malignant neoplasm of head of pancreas: Secondary | ICD-10-CM | POA: Insufficient documentation

## 2023-06-27 DIAGNOSIS — D61818 Other pancytopenia: Secondary | ICD-10-CM | POA: Insufficient documentation

## 2023-06-27 DIAGNOSIS — Z95828 Presence of other vascular implants and grafts: Secondary | ICD-10-CM

## 2023-06-27 DIAGNOSIS — T451X5A Adverse effect of antineoplastic and immunosuppressive drugs, initial encounter: Secondary | ICD-10-CM

## 2023-06-27 LAB — CMP (CANCER CENTER ONLY)
ALT: 34 U/L (ref 0–44)
AST: 44 U/L — ABNORMAL HIGH (ref 15–41)
Albumin: 3.6 g/dL (ref 3.5–5.0)
Alkaline Phosphatase: 221 U/L — ABNORMAL HIGH (ref 38–126)
Anion gap: 6 (ref 5–15)
BUN: 12 mg/dL (ref 8–23)
CO2: 26 mmol/L (ref 22–32)
Calcium: 9.1 mg/dL (ref 8.9–10.3)
Chloride: 111 mmol/L (ref 98–111)
Creatinine: 0.87 mg/dL (ref 0.61–1.24)
GFR, Estimated: >60 mL/min (ref >60)
Glucose, Bld: 123 mg/dL — ABNORMAL HIGH (ref 70–99)
Potassium: 4.1 mmol/L (ref 3.5–5.1)
Sodium: 143 mmol/L (ref 135–145)
Total Bilirubin: 0.4 mg/dL (ref 0.3–1.2)
Total Protein: 6.5 g/dL (ref 6.5–8.1)

## 2023-06-27 LAB — CBC WITH DIFFERENTIAL (CANCER CENTER ONLY)
Abs Immature Granulocytes: 0.01 10*3/uL (ref 0.00–0.07)
Basophils Absolute: 0 10*3/uL (ref 0.0–0.1)
Basophils Relative: 1 %
Eosinophils Absolute: 0.1 10*3/uL (ref 0.0–0.5)
Eosinophils Relative: 2 %
HCT: 31.4 % — ABNORMAL LOW (ref 39.0–52.0)
Hemoglobin: 10 g/dL — ABNORMAL LOW (ref 13.0–17.0)
Immature Granulocytes: 1 %
Lymphocytes Relative: 28 %
Lymphs Abs: 0.6 10*3/uL — ABNORMAL LOW (ref 0.7–4.0)
MCH: 27.7 pg (ref 26.0–34.0)
MCHC: 31.8 g/dL (ref 30.0–36.0)
MCV: 87 fL (ref 80.0–100.0)
Monocytes Absolute: 0.3 10*3/uL (ref 0.1–1.0)
Monocytes Relative: 14 %
Neutro Abs: 1.2 10*3/uL — ABNORMAL LOW (ref 1.7–7.7)
Neutrophils Relative %: 54 %
Platelet Count: 103 10*3/uL — ABNORMAL LOW (ref 150–400)
RBC: 3.61 MIL/uL — ABNORMAL LOW (ref 4.22–5.81)
RDW: 15.6 % — ABNORMAL HIGH (ref 11.5–15.5)
WBC Count: 2.1 10*3/uL — ABNORMAL LOW (ref 4.0–10.5)
nRBC: 0 % (ref 0.0–0.2)

## 2023-06-27 MED ORDER — HEPARIN SOD (PORK) LOCK FLUSH 100 UNIT/ML IV SOLN
500.0000 [IU] | Freq: Once | INTRAVENOUS | Status: AC
  Administered 2023-06-27: 500 [IU]

## 2023-06-27 MED ORDER — SODIUM CHLORIDE 0.9% FLUSH
10.0000 mL | Freq: Once | INTRAVENOUS | Status: AC
  Administered 2023-06-27: 10 mL

## 2023-06-28 ENCOUNTER — Telehealth: Payer: Self-pay | Admitting: Hematology

## 2023-06-28 ENCOUNTER — Other Ambulatory Visit: Payer: Self-pay

## 2023-06-30 ENCOUNTER — Other Ambulatory Visit: Payer: Self-pay

## 2023-07-14 DIAGNOSIS — C250 Malignant neoplasm of head of pancreas: Secondary | ICD-10-CM | POA: Diagnosis not present

## 2023-07-14 DIAGNOSIS — C259 Malignant neoplasm of pancreas, unspecified: Secondary | ICD-10-CM | POA: Diagnosis not present

## 2023-07-31 ENCOUNTER — Other Ambulatory Visit: Payer: Self-pay

## 2023-08-08 ENCOUNTER — Inpatient Hospital Stay: Payer: Medicare Other | Attending: Hematology

## 2023-08-08 DIAGNOSIS — Z8507 Personal history of malignant neoplasm of pancreas: Secondary | ICD-10-CM | POA: Diagnosis not present

## 2023-08-08 DIAGNOSIS — C259 Malignant neoplasm of pancreas, unspecified: Secondary | ICD-10-CM

## 2023-08-08 DIAGNOSIS — Z95828 Presence of other vascular implants and grafts: Secondary | ICD-10-CM

## 2023-08-08 DIAGNOSIS — C250 Malignant neoplasm of head of pancreas: Secondary | ICD-10-CM | POA: Diagnosis present

## 2023-08-08 LAB — CBC WITH DIFFERENTIAL (CANCER CENTER ONLY)
Abs Immature Granulocytes: 0.01 10*3/uL (ref 0.00–0.07)
Basophils Absolute: 0 10*3/uL (ref 0.0–0.1)
Basophils Relative: 0 %
Eosinophils Absolute: 0.1 10*3/uL (ref 0.0–0.5)
Eosinophils Relative: 3 %
HCT: 30.2 % — ABNORMAL LOW (ref 39.0–52.0)
Hemoglobin: 10.1 g/dL — ABNORMAL LOW (ref 13.0–17.0)
Immature Granulocytes: 0 %
Lymphocytes Relative: 26 %
Lymphs Abs: 0.7 10*3/uL (ref 0.7–4.0)
MCH: 28.1 pg (ref 26.0–34.0)
MCHC: 33.4 g/dL (ref 30.0–36.0)
MCV: 83.9 fL (ref 80.0–100.0)
Monocytes Absolute: 0.4 10*3/uL (ref 0.1–1.0)
Monocytes Relative: 15 %
Neutro Abs: 1.6 10*3/uL — ABNORMAL LOW (ref 1.7–7.7)
Neutrophils Relative %: 56 %
Platelet Count: 88 10*3/uL — ABNORMAL LOW (ref 150–400)
RBC: 3.6 MIL/uL — ABNORMAL LOW (ref 4.22–5.81)
RDW: 17 % — ABNORMAL HIGH (ref 11.5–15.5)
WBC Count: 2.8 10*3/uL — ABNORMAL LOW (ref 4.0–10.5)
nRBC: 0 % (ref 0.0–0.2)

## 2023-08-08 LAB — CMP (CANCER CENTER ONLY)
ALT: 38 U/L (ref 0–44)
AST: 47 U/L — ABNORMAL HIGH (ref 15–41)
Albumin: 3.4 g/dL — ABNORMAL LOW (ref 3.5–5.0)
Alkaline Phosphatase: 212 U/L — ABNORMAL HIGH (ref 38–126)
Anion gap: 3 — ABNORMAL LOW (ref 5–15)
BUN: 15 mg/dL (ref 8–23)
CO2: 25 mmol/L (ref 22–32)
Calcium: 9 mg/dL (ref 8.9–10.3)
Chloride: 114 mmol/L — ABNORMAL HIGH (ref 98–111)
Creatinine: 0.93 mg/dL (ref 0.61–1.24)
GFR, Estimated: >60 mL/min (ref >60)
Glucose, Bld: 103 mg/dL — ABNORMAL HIGH (ref 70–99)
Potassium: 4.1 mmol/L (ref 3.5–5.1)
Sodium: 142 mmol/L (ref 135–145)
Total Bilirubin: 0.4 mg/dL (ref 0.3–1.2)
Total Protein: 6.1 g/dL — ABNORMAL LOW (ref 6.5–8.1)

## 2023-08-08 MED ORDER — SODIUM CHLORIDE 0.9% FLUSH
10.0000 mL | Freq: Once | INTRAVENOUS | Status: AC
  Administered 2023-08-08: 10 mL

## 2023-08-09 DIAGNOSIS — M1711 Unilateral primary osteoarthritis, right knee: Secondary | ICD-10-CM | POA: Diagnosis not present

## 2023-08-12 ENCOUNTER — Other Ambulatory Visit: Payer: Self-pay

## 2023-09-19 ENCOUNTER — Inpatient Hospital Stay: Payer: Medicare Other | Attending: Hematology

## 2023-09-19 ENCOUNTER — Other Ambulatory Visit: Payer: Self-pay

## 2023-09-19 VITALS — BP 115/53 | HR 84 | Temp 98.5°F | Resp 18

## 2023-09-19 DIAGNOSIS — Z8507 Personal history of malignant neoplasm of pancreas: Secondary | ICD-10-CM | POA: Diagnosis not present

## 2023-09-19 DIAGNOSIS — Z95828 Presence of other vascular implants and grafts: Secondary | ICD-10-CM

## 2023-09-19 DIAGNOSIS — C259 Malignant neoplasm of pancreas, unspecified: Secondary | ICD-10-CM

## 2023-09-19 LAB — CMP (CANCER CENTER ONLY)
ALT: 51 U/L — ABNORMAL HIGH (ref 0–44)
AST: 55 U/L — ABNORMAL HIGH (ref 15–41)
Albumin: 3.5 g/dL (ref 3.5–5.0)
Alkaline Phosphatase: 159 U/L — ABNORMAL HIGH (ref 38–126)
Anion gap: 5 (ref 5–15)
BUN: 10 mg/dL (ref 8–23)
CO2: 26 mmol/L (ref 22–32)
Calcium: 9 mg/dL (ref 8.9–10.3)
Chloride: 109 mmol/L (ref 98–111)
Creatinine: 0.85 mg/dL (ref 0.61–1.24)
GFR, Estimated: >60 mL/min (ref >60)
Glucose, Bld: 119 mg/dL — ABNORMAL HIGH (ref 70–99)
Potassium: 3.9 mmol/L (ref 3.5–5.1)
Sodium: 140 mmol/L (ref 135–145)
Total Bilirubin: 0.8 mg/dL (ref <1.2)
Total Protein: 6.4 g/dL — ABNORMAL LOW (ref 6.5–8.1)

## 2023-09-19 LAB — CBC WITH DIFFERENTIAL (CANCER CENTER ONLY)
Abs Immature Granulocytes: 0 10*3/uL (ref 0.00–0.07)
Basophils Absolute: 0 10*3/uL (ref 0.0–0.1)
Basophils Relative: 1 %
Eosinophils Absolute: 0.1 10*3/uL (ref 0.0–0.5)
Eosinophils Relative: 2 %
HCT: 32.8 % — ABNORMAL LOW (ref 39.0–52.0)
Hemoglobin: 11 g/dL — ABNORMAL LOW (ref 13.0–17.0)
Immature Granulocytes: 0 %
Lymphocytes Relative: 22 %
Lymphs Abs: 0.8 10*3/uL (ref 0.7–4.0)
MCH: 28.9 pg (ref 26.0–34.0)
MCHC: 33.5 g/dL (ref 30.0–36.0)
MCV: 86.1 fL (ref 80.0–100.0)
Monocytes Absolute: 0.5 10*3/uL (ref 0.1–1.0)
Monocytes Relative: 13 %
Neutro Abs: 2.5 10*3/uL (ref 1.7–7.7)
Neutrophils Relative %: 62 %
Platelet Count: 112 10*3/uL — ABNORMAL LOW (ref 150–400)
RBC: 3.81 MIL/uL — ABNORMAL LOW (ref 4.22–5.81)
RDW: 17.3 % — ABNORMAL HIGH (ref 11.5–15.5)
WBC Count: 3.9 10*3/uL — ABNORMAL LOW (ref 4.0–10.5)
nRBC: 0 % (ref 0.0–0.2)

## 2023-09-19 MED ORDER — SODIUM CHLORIDE 0.9% FLUSH
10.0000 mL | Freq: Once | INTRAVENOUS | Status: AC
  Administered 2023-09-19: 10 mL

## 2023-09-19 MED ORDER — HEPARIN SOD (PORK) LOCK FLUSH 100 UNIT/ML IV SOLN
500.0000 [IU] | Freq: Once | INTRAVENOUS | Status: AC
  Administered 2023-09-19: 500 [IU]

## 2023-09-22 ENCOUNTER — Other Ambulatory Visit: Payer: Self-pay | Admitting: Internal Medicine

## 2023-09-29 ENCOUNTER — Encounter: Payer: Self-pay | Admitting: Internal Medicine

## 2023-09-29 NOTE — Patient Instructions (Addendum)
       Medications changes include :   none       Return in about 6 months (around 03/29/2024) for Physical Exam.

## 2023-09-29 NOTE — Progress Notes (Addendum)
Subjective:    Patient ID: [name], male    DOB: October 20, 1946, 77 y.o.   MRN: [id number]     HPI [place] is here for follow up of his chronic medical problems.   Sugars at home 95-125.      He is eating well and sleeps pretty well.  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   Medications and allergies reviewed with patient and updated if appropriate.  Current Outpatient Medications on File Prior to Visit  Medication Sig Dispense Refill   ACCU-CHEK GUIDE test strip USE TO check blood sugar DAILY AS DIRECTED 100 strip 3   acetaminophen (TYLENOL) 650 MG CR tablet Take 1,300 mg by mouth as needed for pain.     albuterol (VENTOLIN HFA) 108 (90 Base) MCG/ACT inhaler Inhale 2 puffs into the lungs every 6 (six) hours as needed for wheezing or shortness of breath. Inhale 2 puffs in the lungs every 4 hours as needed for cough, wheezing, SOB. (Patient taking differently: Inhale 2 puffs into the lungs every 6 (six) hours as needed for wheezing or shortness of breath.) 18 g 11   finasteride (PROSCAR) 5 MG tablet Take 5 mg by mouth daily.     furosemide (LASIX) 40 MG tablet TAKE 1 TABLET BY MOUTH EVERY DAY 90 tablet 1   ibuprofen (ADVIL) 400 MG tablet Take 1 tablet (400 mg total) by mouth every 6 (six) hours as needed for mild pain, fever or moderate pain. 30 tablet 0   KLOR-CON M20 20 MEQ tablet TAKE 1 TABLET BY MOUTH EVERY DAY 90 tablet 1   Lancets MISC Test blood sugar daily. Dx code: 250.00 100 each 3   lidocaine-prilocaine (EMLA) cream Apply 1 Application topically as needed. (Patient taking differently: Apply 1 Application topically as needed (port access).) 30 g 2   ondansetron (ZOFRAN) 4 MG tablet Take 1 tablet (4 mg total) by mouth every 6 (six) hours as needed for nausea. 20 tablet 0   pantoprazole (PROTONIX) 20 MG tablet TAKE 1 TABLET BY MOUTH EVERY DAY 90 tablet 2   prochlorperazine (COMPAZINE) 10 MG tablet Take 1 tablet (10 mg total) by mouth every 6 (six) hours  as needed. 30 tablet 2   silodosin (RAPAFLO) 8 MG CAPS capsule Take 8 mg by mouth daily.     simvastatin (ZOCOR) 20 MG tablet Take 1 tablet (20 mg total) by mouth at bedtime. 90 tablet 3   No current facility-administered medications on file prior to visit.     Review of Systems  Constitutional:  Negative for fever.  Respiratory:  Negative for cough, shortness of breath and wheezing.   Cardiovascular:  Negative for chest pain, palpitations and leg swelling.  Gastrointestinal:  Positive for abdominal pain (mild).       GERD controlled  Neurological:  Positive for numbness (in fingers and toes). Negative for light-headedness and headaches.       Objective:   Vitals:   09/30/23 0750  BP: 130/70  Pulse: 89  Temp: 98.2 F (36.8 C)  SpO2: 100%   BP Readings from Last 3 Encounters:  09/30/23 130/70  09/19/23 (!) 115/53  06/27/23 (!) 111/58   Wt Readings from Last 3 Encounters:  09/30/23 188 lb (85.3 kg)  06/27/23 193 lb (87.5 kg)  05/18/23 199 lb (90.3 kg)   Body mass index is 28.59 kg/m.    Physical Exam Constitutional:      General: He is not in acute  distress.    Appearance: Normal appearance. He is not ill-appearing.  HENT:     Head: Normocephalic and atraumatic.  Eyes:     Conjunctiva/sclera: Conjunctivae normal.  Cardiovascular:     Rate and Rhythm: Normal rate and regular rhythm.     Heart sounds: Normal heart sounds.  Pulmonary:     Effort: Pulmonary effort is normal. No respiratory distress.     Breath sounds: Normal breath sounds. No wheezing or rales.  Musculoskeletal:     Right lower leg: Edema (trace) present.     Left lower leg: Edema (trace) present.  Skin:    General: Skin is warm and dry.     Findings: No rash.  Neurological:     Mental Status: He is alert. Mental status is at baseline.  Psychiatric:        Mood and Affect: Mood normal.        Lab Results  Component Value Date   WBC 3.9 (L) 09/19/2023   HGB 11.0 (L) 09/19/2023   HCT  32.8 (L) 09/19/2023   PLT 112 (L) 09/19/2023   GLUCOSE 119 (H) 09/19/2023   CHOL 119 02/04/2022   TRIG 77.0 02/04/2022   HDL 40.20 02/04/2022   LDLCALC 64 02/04/2022   ALT 51 (H) 09/19/2023   AST 55 (H) 09/19/2023   NA 140 09/19/2023   K 3.9 09/19/2023   CL 109 09/19/2023   CREATININE 0.85 09/19/2023   BUN 10 09/19/2023   CO2 26 09/19/2023   TSH 3.00 07/31/2019   PSA 1.09 07/22/2017   INR 0.9 06/03/2022   HGBA1C 5.7 (H) 06/03/2022   MICROALBUR 0.3 11/25/2015     Assessment & Plan:    See Problem List for Assessment and Plan of chronic medical problems.

## 2023-09-30 ENCOUNTER — Ambulatory Visit (INDEPENDENT_AMBULATORY_CARE_PROVIDER_SITE_OTHER): Payer: Medicare Other | Admitting: Internal Medicine

## 2023-09-30 VITALS — BP 130/70 | HR 89 | Temp 98.2°F | Ht 68.0 in | Wt 188.0 lb

## 2023-09-30 DIAGNOSIS — E785 Hyperlipidemia, unspecified: Secondary | ICD-10-CM | POA: Diagnosis not present

## 2023-09-30 DIAGNOSIS — R7303 Prediabetes: Secondary | ICD-10-CM | POA: Diagnosis not present

## 2023-09-30 DIAGNOSIS — R600 Localized edema: Secondary | ICD-10-CM | POA: Diagnosis not present

## 2023-09-30 DIAGNOSIS — J4520 Mild intermittent asthma, uncomplicated: Secondary | ICD-10-CM

## 2023-09-30 DIAGNOSIS — K219 Gastro-esophageal reflux disease without esophagitis: Secondary | ICD-10-CM | POA: Diagnosis not present

## 2023-09-30 NOTE — Assessment & Plan Note (Signed)
Chronic Continue simvastatin 20 mg daily

## 2023-09-30 NOTE — Assessment & Plan Note (Signed)
Chronic Lab Results  Component Value Date   HGBA1C 5.7 (H) 06/03/2022   Sugars controlled with diet

## 2023-09-30 NOTE — Assessment & Plan Note (Addendum)
Chronic Mild, intermittent Continue albuterol inhaler as needed - has not needed it in a long time

## 2023-09-30 NOTE — Assessment & Plan Note (Signed)
Chronic Controlled Continue Lasix 40 mg daily, potassium 20 mg daily

## 2023-09-30 NOTE — Assessment & Plan Note (Signed)
Chronic GERD controlled Continue pantoprazole 20 mg daily 

## 2023-10-02 ENCOUNTER — Other Ambulatory Visit: Payer: Self-pay

## 2023-10-21 DIAGNOSIS — Z90411 Acquired partial absence of pancreas: Secondary | ICD-10-CM | POA: Diagnosis not present

## 2023-10-21 DIAGNOSIS — I81 Portal vein thrombosis: Secondary | ICD-10-CM | POA: Diagnosis not present

## 2023-10-21 DIAGNOSIS — R188 Other ascites: Secondary | ICD-10-CM | POA: Diagnosis not present

## 2023-10-21 DIAGNOSIS — M7989 Other specified soft tissue disorders: Secondary | ICD-10-CM | POA: Diagnosis not present

## 2023-10-21 DIAGNOSIS — Z9049 Acquired absence of other specified parts of digestive tract: Secondary | ICD-10-CM | POA: Diagnosis not present

## 2023-10-21 DIAGNOSIS — C259 Malignant neoplasm of pancreas, unspecified: Secondary | ICD-10-CM | POA: Diagnosis not present

## 2023-10-25 ENCOUNTER — Telehealth: Payer: Self-pay

## 2023-10-25 NOTE — Telephone Encounter (Signed)
Pt LVM stating that he's scheduled here on 10/31/2023 for lab but also have an appt in [place] around the same time.  Pt wanted to know what he's supposed to do about his appts.  Returned pt's call and pt stated he spoke with the doctor at [facility] and was told to cancel the port flush appt scheduled on 10/31/2023.  Stated Yes the port flush appt can be rescheduled to a later date.  Pt verbalized understanding and had no further questions or concerns.

## 2023-10-31 ENCOUNTER — Inpatient Hospital Stay: Payer: Medicare Other

## 2023-10-31 DIAGNOSIS — K7290 Hepatic failure, unspecified without coma: Secondary | ICD-10-CM | POA: Diagnosis not present

## 2023-10-31 DIAGNOSIS — K661 Hemoperitoneum: Secondary | ICD-10-CM | POA: Diagnosis not present

## 2023-10-31 DIAGNOSIS — R188 Other ascites: Secondary | ICD-10-CM | POA: Diagnosis not present

## 2023-10-31 DIAGNOSIS — C259 Malignant neoplasm of pancreas, unspecified: Secondary | ICD-10-CM | POA: Diagnosis not present

## 2023-10-31 DIAGNOSIS — I8510 Secondary esophageal varices without bleeding: Secondary | ICD-10-CM | POA: Diagnosis not present

## 2023-10-31 DIAGNOSIS — Z95828 Presence of other vascular implants and grafts: Secondary | ICD-10-CM | POA: Diagnosis not present

## 2023-10-31 DIAGNOSIS — I871 Compression of vein: Secondary | ICD-10-CM | POA: Diagnosis not present

## 2023-11-04 DIAGNOSIS — H25813 Combined forms of age-related cataract, bilateral: Secondary | ICD-10-CM | POA: Diagnosis not present

## 2023-11-04 DIAGNOSIS — E119 Type 2 diabetes mellitus without complications: Secondary | ICD-10-CM | POA: Diagnosis not present

## 2023-11-04 DIAGNOSIS — H5203 Hypermetropia, bilateral: Secondary | ICD-10-CM | POA: Diagnosis not present

## 2023-11-07 DIAGNOSIS — Z08 Encounter for follow-up examination after completed treatment for malignant neoplasm: Secondary | ICD-10-CM | POA: Diagnosis not present

## 2023-11-07 DIAGNOSIS — M7989 Other specified soft tissue disorders: Secondary | ICD-10-CM | POA: Diagnosis not present

## 2023-11-07 DIAGNOSIS — Z8507 Personal history of malignant neoplasm of pancreas: Secondary | ICD-10-CM | POA: Diagnosis not present

## 2023-11-07 DIAGNOSIS — C259 Malignant neoplasm of pancreas, unspecified: Secondary | ICD-10-CM | POA: Diagnosis not present

## 2023-11-08 ENCOUNTER — Encounter (HOSPITAL_BASED_OUTPATIENT_CLINIC_OR_DEPARTMENT_OTHER): Payer: Self-pay

## 2023-11-08 ENCOUNTER — Emergency Department (HOSPITAL_BASED_OUTPATIENT_CLINIC_OR_DEPARTMENT_OTHER)
Admission: EM | Admit: 2023-11-08 | Discharge: 2023-11-08 | Disposition: A | Discharge status: Home / Self Care | Payer: Medicare Other | Attending: Emergency Medicine | Admitting: Emergency Medicine

## 2023-11-08 ENCOUNTER — Other Ambulatory Visit: Payer: Self-pay

## 2023-11-08 ENCOUNTER — Emergency Department (HOSPITAL_BASED_OUTPATIENT_CLINIC_OR_DEPARTMENT_OTHER): Payer: Medicare Other | Admitting: Radiology

## 2023-11-08 DIAGNOSIS — S52591A Other fractures of lower end of right radius, initial encounter for closed fracture: Secondary | ICD-10-CM | POA: Diagnosis not present

## 2023-11-08 DIAGNOSIS — M79631 Pain in right forearm: Secondary | ICD-10-CM | POA: Diagnosis not present

## 2023-11-08 DIAGNOSIS — Z8507 Personal history of malignant neoplasm of pancreas: Secondary | ICD-10-CM | POA: Diagnosis not present

## 2023-11-08 DIAGNOSIS — M25531 Pain in right wrist: Secondary | ICD-10-CM | POA: Diagnosis not present

## 2023-11-08 DIAGNOSIS — M7989 Other specified soft tissue disorders: Secondary | ICD-10-CM | POA: Diagnosis not present

## 2023-11-08 MED ORDER — OXYCODONE HCL 5 MG PO TABS: 5.0000 mg | ORAL_TABLET | ORAL | 0 refills | Status: DC | PRN

## 2023-11-08 MED ORDER — OXYCODONE-ACETAMINOPHEN 5-325 MG PO TABS
1.0000 | ORAL_TABLET | ORAL | Status: DC | PRN
  Administered 2023-11-08: 1 via ORAL
  Filled 2023-11-08: qty 1

## 2023-11-08 MED ORDER — CELECOXIB 200 MG PO CAPS: 200.0000 mg | ORAL_CAPSULE | Freq: Two times a day (BID) | ORAL | 0 refills | Status: DC

## 2023-11-08 NOTE — ED Triage Notes (Signed)
Pt reports Rt wrist pain x2 weeks. No injury causing pain. Pt states wrist pain started getting much worse tonight. Mild swelling noted. Pt able to move fingers freely, sensation intact distally. Pt reports having pancreatic cancer.

## 2023-11-08 NOTE — ED Provider Notes (Signed)
[place] [facility]ERGENCY DEPARTMENT AT [facility] Provider Note   CSN: [id number] Arrival date & time: 11/08/23  0348     History  Chief Complaint  Patient presents with   Wrist Pain    [name] is a 77 y.o. male.  The history is provided by the patient.  Wrist Pain  He has history of hyperlipidemia, pancreatic cancer and comes in complaining of pain in his right wrist over the last 2 weeks, getting worse.  He thinks he may have pulled it because he has to use a lift to help his wife in and out of bed.  He has been using a brace with has not been giving him relief.  He denies any other trauma.   Home Medications Prior to Admission medications   Medication Sig Start Date End Date Taking? Authorizing Provider  ACCU-CHEK GUIDE test strip USE TO check blood sugar DAILY AS DIRECTED 08/14/20   [name], MD  acetaminophen (TYLENOL) 650 MG CR tablet Take 1,300 mg by mouth as needed for pain.    [provider]  albuterol (VENTOLIN HFA) 108 (90 Base) MCG/ACT inhaler Inhale 2 puffs into the lungs every 6 (six) hours as needed for wheezing or shortness of breath. Inhale 2 puffs in the lungs every 4 hours as needed for cough, wheezing, SOB. Patient taking differently: Inhale 2 puffs into the lungs every 6 (six) hours as needed for wheezing or shortness of breath. 08/25/22   [name], MD  finasteride (PROSCAR) 5 MG tablet Take 5 mg by mouth daily. 07/08/22   [provider]  furosemide (LASIX) 40 MG tablet TAKE 1 TABLET BY MOUTH EVERY DAY 06/06/23   [name], MD  ibuprofen (ADVIL) 400 MG tablet Take 1 tablet (400 mg total) by mouth every 6 (six) hours as needed for mild pain, fever or moderate pain. 03/19/23   Azucena Fallen, MD  KLOR-CON M20 20 MEQ tablet TAKE 1 TABLET BY MOUTH EVERY DAY 06/06/23   [name], MD  Lancets MISC Test blood sugar daily. Dx code: 250.00 05/17/14   Nelva Nay, PA-C  lidocaine-prilocaine (EMLA) cream Apply 1  Application topically as needed. Patient taking differently: Apply 1 Application topically as needed (port access). 06/01/22   Heilingoetter, Cassandra L, PA-C  ondansetron (ZOFRAN) 4 MG tablet Take 1 tablet (4 mg total) by mouth every 6 (six) hours as needed for nausea. 03/19/23   Azucena Fallen, MD  pantoprazole (PROTONIX) 20 MG tablet TAKE 1 TABLET BY MOUTH EVERY DAY 09/22/23   [name], MD  prochlorperazine (COMPAZINE) 10 MG tablet Take 1 tablet (10 mg total) by mouth every 6 (six) hours as needed. 06/01/22   Heilingoetter, Cassandra L, PA-C  silodosin (RAPAFLO) 8 MG CAPS capsule Take 8 mg by mouth daily. 07/08/22   [provider]  simvastatin (ZOCOR) 20 MG tablet Take 1 tablet (20 mg total) by mouth at bedtime. 03/31/23   [name], MD      Allergies    Patient has no known allergies.    Review of Systems   Review of Systems  All other systems reviewed and are negative.   Physical Exam Updated Vital Signs BP (!) 145/79 (BP Location: Right Arm)   Pulse 77   Temp 98 F (36.7 C)   Resp 18   Ht 5\' 8"  (1.727 m)   Wt 84.4 kg   SpO2 100%   BMI 28.28 kg/m  Physical Exam  Vitals and nursing note reviewed.   77 year old male, resting comfortably and in no acute distress. Vital signs are significant for mildly elevated blood pressure. Oxygen saturation is 100%, which is normal. Head is normocephalic and atraumatic. PERRLA, EOMI.  Lungs are clear without rales, wheezes, or rhonchi. Chest is nontender. Heart has regular rate and rhythm without murmur. Abdomen is soft, flat, nontender. Extremities: There is mild soft tissue swelling over the ulnar aspect of the distal right forearm and small effusion present in the right wrist.  There is tenderness palpation over the distal ulna and over the wrist joint.  There is no significant warmth.  Distal neurovascular exam is intact with prompt capillary refill, normal sensation. Skin is warm and dry without rash. Neurologic:  Mental status is normal, moves all extremities equally.  ED Results / Procedures / Treatments    Radiology DG Wrist Complete Right Result Date: 11/08/2023 CLINICAL DATA:  77 year old male with right wrist and forearm pain and swelling x2 weeks. Pancreatic cancer. EXAM: RIGHT WRIST - COMPLETE 3+ VIEW COMPARISON:  Right forearm series today. FINDINGS: Lucent, mildly heterogeneous but fairly circumscribed 13 mm lesion within the distal ulna is subchondral, sparing the ulnar styloid which appears to be intact. There is a superimposed 1 cm crescent shaped chronic appearing bone fragment overlying the distal radius or ulna at the dorsal wrist. Regional soft tissue swelling and stranding. No soft tissue gas. Superimposed degenerative osteophytosis at the right wrist, 1st CMC joint. Carpal bone alignment maintained. Metacarpals appear intact. No acute fracture or dislocation identified. IMPRESSION: 1. Nonspecific round, 13 mm Lucent bone lesion of the distal right ulna. This could be chronic and unrelated but a small bone metastasis is not excluded in this clinical setting. No acute pathologic fracture. 2. Other chronic degeneration at the right wrist and 1st CMC joint. And 1 cm chronic appearing dorsal bone fragment at the dorsal proximal wrist. 3. Nonspecific regional soft tissue swelling. Electronically Signed   By: Odessa Fleming M.D.   On: 11/08/2023 05:04   DG Forearm Right Result Date: 11/08/2023 CLINICAL DATA:  77 year old male with right wrist and forearm pain and swelling x2 weeks. Pancreatic cancer. EXAM: RIGHT FOREARM - 2 VIEW COMPARISON:  None Available. FINDINGS: Oblique lateral view of the right elbow with grossly maintained alignment there. But degenerative osteophytosis at both the distal humerus, proximal radius and ulna. No obvious elbow joint effusion. Radius bone mineralization appears normal. There is a lucent lesion in the distal ulna, see wrist series today reported separately. No superimposed  acute fracture identified in the right forearm. Proximal to the wrist forearm soft tissue contours are within normal limits. IMPRESSION: 1. Lucent lesion in the distal ulna, see Wrist series today reported separately. 2. No superimposed acute osseous abnormality identified in the right forearm. 3. Degenerative osteophytosis at the right elbow. Electronically Signed   By: Odessa Fleming M.D.   On: 11/08/2023 04:59    Procedures Procedures    Medications Ordered in ED Medications  oxyCODONE-acetaminophen (PERCOCET/ROXICET) 5-325 MG per tablet 1 tablet (1 tablet Oral Given 11/08/23 0404)    ED Course/ Medical Decision Making/ A&P                                 Medical Decision Making Amount and/or Complexity of Data Reviewed Radiology: ordered.  Risk Prescription drug management.   Left wrist pain with no significant history of trauma.  Doubt gout.  X-rays show soft tissue swelling and 13 mm lucent bone lesion which could represent bony metastasis.  I have independently viewed the images, and agree with the radiologist's interpretation.  I have discussed the case with Dr. Melvyn Novas of hand surgery service who requests he be placed in the wrist splint and sent him to the office for follow-up.  I am discharging him with a prescription for celecoxib and oxycodone.  I have recommending he apply ice.  Final Clinical Impression(s) / ED Diagnoses Final diagnoses:  Pain in right wrist    Rx / DC Orders ED Discharge Orders          Ordered    celecoxib (CELEBREX) 200 MG capsule  2 times daily        11/08/23 0657    oxyCODONE (ROXICODONE) 5 MG immediate release tablet  Every 4 hours PRN        11/08/23 0657              Dione Booze, MD 11/08/23 [id number]

## 2023-11-08 NOTE — Discharge Instructions (Signed)
Your x-ray shows a lesion in your ulna, which is one of the bones in your forearm.  It is not clear if your pain is related to that.  Please wear the wrist brace at all times.  Apply ice to the area that is painful.  Ice should be applied for 30 minutes at a time, 4 times a day.  In addition to the celecoxib, you may take acetaminophen as needed for additional pain relief.  Reserve oxycodone for pain that is not relieved with both celecoxib and acetaminophen.  Call the hand specialist for an appointment to further evaluate why you are having pain in your wrist.

## 2023-11-17 ENCOUNTER — Other Ambulatory Visit: Payer: Self-pay

## 2023-11-21 ENCOUNTER — Telehealth: Payer: Self-pay | Admitting: Hematology

## 2023-11-21 ENCOUNTER — Other Ambulatory Visit: Payer: Self-pay | Admitting: *Deleted

## 2023-11-21 DIAGNOSIS — K8689 Other specified diseases of pancreas: Secondary | ICD-10-CM

## 2023-11-21 DIAGNOSIS — C259 Malignant neoplasm of pancreas, unspecified: Secondary | ICD-10-CM

## 2023-11-21 NOTE — Telephone Encounter (Signed)
 Scheduled appointment per 1/6 secure chat. Patient is aware of the made appointments.

## 2023-11-22 DIAGNOSIS — M25531 Pain in right wrist: Secondary | ICD-10-CM | POA: Diagnosis not present

## 2023-11-24 ENCOUNTER — Inpatient Hospital Stay: Payer: Medicare Other | Attending: Hematology

## 2023-11-24 ENCOUNTER — Other Ambulatory Visit: Payer: Medicare Other

## 2023-11-24 ENCOUNTER — Ambulatory Visit (HOSPITAL_COMMUNITY)
Admission: RE | Admit: 2023-11-24 | Discharge: 2023-11-24 | Disposition: A | Discharge status: Home / Self Care | Payer: Medicare Other | Source: Ambulatory Visit | Attending: Hematology | Admitting: Hematology

## 2023-11-24 DIAGNOSIS — K8689 Other specified diseases of pancreas: Secondary | ICD-10-CM | POA: Insufficient documentation

## 2023-11-24 DIAGNOSIS — G620 Drug-induced polyneuropathy: Secondary | ICD-10-CM

## 2023-11-24 DIAGNOSIS — I700 Atherosclerosis of aorta: Secondary | ICD-10-CM | POA: Diagnosis not present

## 2023-11-24 DIAGNOSIS — Z5111 Encounter for antineoplastic chemotherapy: Secondary | ICD-10-CM | POA: Diagnosis not present

## 2023-11-24 DIAGNOSIS — C259 Malignant neoplasm of pancreas, unspecified: Secondary | ICD-10-CM

## 2023-11-24 DIAGNOSIS — Z95828 Presence of other vascular implants and grafts: Secondary | ICD-10-CM

## 2023-11-24 DIAGNOSIS — C250 Malignant neoplasm of head of pancreas: Secondary | ICD-10-CM | POA: Diagnosis not present

## 2023-11-24 DIAGNOSIS — C220 Liver cell carcinoma: Secondary | ICD-10-CM | POA: Diagnosis not present

## 2023-11-24 LAB — CBC WITH DIFFERENTIAL (CANCER CENTER ONLY)
Abs Immature Granulocytes: 0.01 10*3/uL (ref 0.00–0.07)
Basophils Absolute: 0 10*3/uL (ref 0.0–0.1)
Basophils Relative: 0 %
Eosinophils Absolute: 0.1 10*3/uL (ref 0.0–0.5)
Eosinophils Relative: 2 %
HCT: 35.4 % — ABNORMAL LOW (ref 39.0–52.0)
Hemoglobin: 12 g/dL — ABNORMAL LOW (ref 13.0–17.0)
Immature Granulocytes: 0 %
Lymphocytes Relative: 25 %
Lymphs Abs: 1 10*3/uL (ref 0.7–4.0)
MCH: 29.9 pg (ref 26.0–34.0)
MCHC: 33.9 g/dL (ref 30.0–36.0)
MCV: 88.1 fL (ref 80.0–100.0)
Monocytes Absolute: 0.5 10*3/uL (ref 0.1–1.0)
Monocytes Relative: 12 %
Neutro Abs: 2.3 10*3/uL (ref 1.7–7.7)
Neutrophils Relative %: 61 %
Platelet Count: 154 10*3/uL (ref 150–400)
RBC: 4.02 MIL/uL — ABNORMAL LOW (ref 4.22–5.81)
RDW: 13.8 % (ref 11.5–15.5)
WBC Count: 3.8 10*3/uL — ABNORMAL LOW (ref 4.0–10.5)
nRBC: 0 % (ref 0.0–0.2)

## 2023-11-24 LAB — CMP (CANCER CENTER ONLY)
ALT: 25 U/L (ref 0–44)
AST: 32 U/L (ref 15–41)
Albumin: 3.9 g/dL (ref 3.5–5.0)
Alkaline Phosphatase: 156 U/L — ABNORMAL HIGH (ref 38–126)
Anion gap: 4 — ABNORMAL LOW (ref 5–15)
BUN: 14 mg/dL (ref 8–23)
CO2: 29 mmol/L (ref 22–32)
Calcium: 9.3 mg/dL (ref 8.9–10.3)
Chloride: 107 mmol/L (ref 98–111)
Creatinine: 0.96 mg/dL (ref 0.61–1.24)
GFR, Estimated: >60 mL/min (ref >60)
Glucose, Bld: 100 mg/dL — ABNORMAL HIGH (ref 70–99)
Potassium: 4 mmol/L (ref 3.5–5.1)
Sodium: 140 mmol/L (ref 135–145)
Total Bilirubin: 0.5 mg/dL (ref 0.0–1.2)
Total Protein: 6.5 g/dL (ref 6.5–8.1)

## 2023-11-24 LAB — VITAMIN B12: Vitamin B-12: 760 pg/mL (ref 180–914)

## 2023-11-24 MED ORDER — HEPARIN SOD (PORK) LOCK FLUSH 100 UNIT/ML IV SOLN
500.0000 [IU] | Freq: Once | INTRAVENOUS | Status: AC
  Administered 2023-11-24: 500 [IU] via INTRAVENOUS

## 2023-11-24 MED ORDER — SODIUM CHLORIDE (PF) 0.9 % IJ SOLN
INTRAMUSCULAR | Status: AC
Start: 2023-11-24 — End: ?
  Filled 2023-11-24: qty 50

## 2023-11-24 MED ORDER — SODIUM CHLORIDE 0.9% FLUSH
10.0000 mL | Freq: Once | INTRAVENOUS | Status: AC
  Administered 2023-11-24: 10 mL

## 2023-11-24 MED ORDER — IOHEXOL 300 MG/ML  SOLN
100.0000 mL | Freq: Once | INTRAMUSCULAR | Status: AC | PRN
  Administered 2023-11-24: 100 mL via INTRAVENOUS

## 2023-11-25 ENCOUNTER — Inpatient Hospital Stay (HOSPITAL_BASED_OUTPATIENT_CLINIC_OR_DEPARTMENT_OTHER): Payer: Medicare Other | Admitting: Hematology

## 2023-11-25 ENCOUNTER — Encounter: Payer: Self-pay | Admitting: Hematology

## 2023-11-25 DIAGNOSIS — C259 Malignant neoplasm of pancreas, unspecified: Secondary | ICD-10-CM | POA: Diagnosis not present

## 2023-11-25 MED ORDER — PROCHLORPERAZINE MALEATE 10 MG PO TABS: 10.0000 mg | ORAL_TABLET | Freq: Four times a day (QID) | ORAL | 1 refills | Status: AC | PRN

## 2023-11-25 MED ORDER — LIDOCAINE-PRILOCAINE 2.5-2.5 % EX CREA: TOPICAL_CREAM | CUTANEOUS | 3 refills | Status: DC

## 2023-11-25 MED ORDER — ONDANSETRON HCL 8 MG PO TABS: 8.0000 mg | ORAL_TABLET | Freq: Three times a day (TID) | ORAL | 1 refills | Status: DC | PRN
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
## 2023-11-25 NOTE — Progress Notes (Signed)
 DISCONTINUE ON PATHWAY REGIMEN - Pancreatic Adenocarcinoma     A cycle is every 14 days:     Liposomal irinotecan       Leucovorin       Fluorouracil    **Always confirm dose/schedule in your pharmacy ordering system**  PRIOR TREATMENT: PANOS74: Liposomal Irinotecan  70 mg/m2 + Fluorouracil  2,400 mg/m2 CIV + Leucovorin  400 mg/m2 q14 Days  START ON PATHWAY REGIMEN - Pancreatic Adenocarcinoma     A cycle is every 28 days:     Nab-paclitaxel  (protein bound)      Gemcitabine    **Always confirm dose/schedule in your pharmacy ordering system**  Patient Characteristics: Metastatic Disease, Second Line, MSS/pMMR or MSI Unknown, Fluoropyrimidine-Based Therapy First Line Therapeutic Status: Metastatic Disease Line of Therapy: Second Line Microsatellite/Mismatch Repair Status: MSS/pMMR Intent of Therapy: Non-Curative / Palliative Intent, Discussed with Patient

## 2023-11-25 NOTE — Assessment & Plan Note (Addendum)
 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 Burnette showed 1.5 cm in pancreatic head, cytology confirmed adenocarcinoma.  An uncovered metal stent was placed in the common bile duct by Dr. Rosalie -he completed 3 months neoadjuvant FOLFIRINOX on 06/08/22 - 09/09/22, he tolerated well. Chemo changed to FOLFIRI with liposomal irinotecan  afterward  -restaging CT AP on 09/02/22 showed similar small amount of abnormal soft tissue adjacent to common bile duct stent (which is appropriately located), otherwise no new lesions or definitive signs of metastatic disease.  -We reviewed his case in GI tumor conference, unfortunately due to the tumor invasion of hepatic artery, our surgeons feel his pancreatic cancer is not resectable.  -His recent restaging CT scan at [facility] showed persistent tumor invasion of hepatic artery, Dr. Romero recommend SBRT, he completed at [facility]  -He was recently hospitalized for hyperbilirubinemia due to stent occlusion, and underwent ERCP again and stent exchange.  MRCP was done in the hospital, which showed no discrete pancreatic mass or evidence of metastasis. -Underwent Whipple with reconstruction of portal vein with saphenous graft 04/14/23 at [facility]  -Unfortunately he developed a local recurrence in December 2024

## 2023-11-26 ENCOUNTER — Other Ambulatory Visit: Payer: Self-pay | Admitting: Internal Medicine

## 2023-11-28 ENCOUNTER — Telehealth: Payer: Self-pay

## 2023-11-28 NOTE — Telephone Encounter (Signed)
 Pt called stating he contact his GI provider per Dr. Demetra request at their last telephone visit.  Pt stated his last colonoscopy was in August 2020.  Pt stated the procedure was done at [facility] but does not know the provider's name but was able to say it was a male.  Pt stated his next colonoscopy is due in August 2025 because he's due every 5 years.  Pt stated Dr. Lanny wanted to know this information and he would like for Dr. Lanny to give him a call back.  Notified Dr. Lanny of the pt's call.

## 2023-12-02 ENCOUNTER — Encounter (HOSPITAL_COMMUNITY)
Admission: RE | Admit: 2023-12-02 | Discharge: 2023-12-02 | Disposition: A | Discharge status: Home / Self Care | Payer: Medicare Other | Source: Ambulatory Visit | Attending: Hematology | Admitting: Hematology

## 2023-12-02 DIAGNOSIS — I6529 Occlusion and stenosis of unspecified carotid artery: Secondary | ICD-10-CM | POA: Diagnosis not present

## 2023-12-02 DIAGNOSIS — Z4682 Encounter for fitting and adjustment of non-vascular catheter: Secondary | ICD-10-CM | POA: Diagnosis not present

## 2023-12-02 DIAGNOSIS — C259 Malignant neoplasm of pancreas, unspecified: Secondary | ICD-10-CM | POA: Diagnosis not present

## 2023-12-02 DIAGNOSIS — S29009A Unspecified injury of muscle and tendon of unspecified wall of thorax, initial encounter: Secondary | ICD-10-CM | POA: Diagnosis not present

## 2023-12-02 LAB — GLUCOSE, CAPILLARY: Glucose-Capillary: 94 mg/dL (ref 70–99)

## 2023-12-02 MED ORDER — FLUDEOXYGLUCOSE F - 18 (FDG) INJECTION
9.6000 | Freq: Once | INTRAVENOUS | Status: AC | PRN
  Administered 2023-12-02: 8.76 via INTRAVENOUS

## 2023-12-06 ENCOUNTER — Telehealth: Payer: Self-pay

## 2023-12-06 NOTE — Telephone Encounter (Signed)
As per Dr. Georga Bora request faxed last office note to [facility] Dr. [name] and a request to have a repeat colonoscopy done.

## 2023-12-07 ENCOUNTER — Telehealth: Payer: Self-pay | Admitting: Hematology

## 2023-12-07 NOTE — Telephone Encounter (Signed)
Left patient a voicemail in regards to scheduled appointment times/dates and also cancelled appointments as well, also called patient's spouse and daughter and left a voicemail with upcoming appointments

## 2023-12-11 NOTE — Assessment & Plan Note (Signed)
-  Oxaliplatin has been discontinued as of 09/09/2022 -overall stable  -Patient has declined Neurontin

## 2023-12-11 NOTE — Assessment & Plan Note (Signed)
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 [name] showed 1.5 cm in pancreatic head, cytology confirmed adenocarcinoma.  An uncovered metal stent was placed in the common bile duct by Dr. Ewing Schlein -he completed 3 months neoadjuvant FOLFIRINOX on 06/08/22 - 09/09/22, he tolerated well. Chemo changed to FOLFIRI with liposomal irinotecan afterward  -restaging CT AP on 09/02/22 showed similar small amount of abnormal soft tissue adjacent to common bile duct stent (which is appropriately located), otherwise no new lesions or definitive signs of metastatic disease.  -We reviewed his case in GI tumor conference, unfortunately due to the tumor invasion of hepatic artery, our surgeons feel his pancreatic cancer is not resectable.  -His recent restaging CT scan at [facility] showed persistent tumor invasion of hepatic artery, Dr. [name] recommend SBRT, he completed at [facility]  -He was recently hospitalized for hyperbilirubinemia due to stent occlusion, and underwent ERCP again and stent exchange.  MRCP was done in the hospital, which showed no discrete pancreatic mass or evidence of metastasis. -Underwent Whipple with reconstruction of portal vein with saphenous graft 04/14/23 at [facility]  -Unfortunately he developed a local recurrence in December 2024 -plan to start chemo gemcitabine and Abraxne on 12/11/2023

## 2023-12-12 ENCOUNTER — Encounter: Payer: Self-pay | Admitting: Hematology

## 2023-12-12 ENCOUNTER — Inpatient Hospital Stay: Payer: Medicare Other

## 2023-12-12 ENCOUNTER — Inpatient Hospital Stay (HOSPITAL_BASED_OUTPATIENT_CLINIC_OR_DEPARTMENT_OTHER): Payer: Medicare Other | Admitting: Hematology

## 2023-12-12 VITALS — BP 130/77 | HR 70 | Temp 98.0°F | Resp 17 | Wt 189.8 lb

## 2023-12-12 DIAGNOSIS — C259 Malignant neoplasm of pancreas, unspecified: Secondary | ICD-10-CM

## 2023-12-12 DIAGNOSIS — T451X5A Adverse effect of antineoplastic and immunosuppressive drugs, initial encounter: Secondary | ICD-10-CM

## 2023-12-12 DIAGNOSIS — C250 Malignant neoplasm of head of pancreas: Secondary | ICD-10-CM | POA: Diagnosis not present

## 2023-12-12 DIAGNOSIS — G620 Drug-induced polyneuropathy: Secondary | ICD-10-CM | POA: Diagnosis not present

## 2023-12-12 DIAGNOSIS — Z5111 Encounter for antineoplastic chemotherapy: Secondary | ICD-10-CM | POA: Diagnosis not present

## 2023-12-12 DIAGNOSIS — Z95828 Presence of other vascular implants and grafts: Secondary | ICD-10-CM

## 2023-12-12 LAB — CBC WITH DIFFERENTIAL (CANCER CENTER ONLY)
Abs Immature Granulocytes: 0.01 10*3/uL (ref 0.00–0.07)
Basophils Absolute: 0 10*3/uL (ref 0.0–0.1)
Basophils Relative: 1 %
Eosinophils Absolute: 0.1 10*3/uL (ref 0.0–0.5)
Eosinophils Relative: 3 %
HCT: 36.8 % — ABNORMAL LOW (ref 39.0–52.0)
Hemoglobin: 12.2 g/dL — ABNORMAL LOW (ref 13.0–17.0)
Immature Granulocytes: 0 %
Lymphocytes Relative: 23 %
Lymphs Abs: 0.7 10*3/uL (ref 0.7–4.0)
MCH: 30 pg (ref 26.0–34.0)
MCHC: 33.2 g/dL (ref 30.0–36.0)
MCV: 90.4 fL (ref 80.0–100.0)
Monocytes Absolute: 0.4 10*3/uL (ref 0.1–1.0)
Monocytes Relative: 12 %
Neutro Abs: 1.9 10*3/uL (ref 1.7–7.7)
Neutrophils Relative %: 61 %
Platelet Count: 125 10*3/uL — ABNORMAL LOW (ref 150–400)
RBC: 4.07 MIL/uL — ABNORMAL LOW (ref 4.22–5.81)
RDW: 13.7 % (ref 11.5–15.5)
WBC Count: 3.2 10*3/uL — ABNORMAL LOW (ref 4.0–10.5)
nRBC: 0 % (ref 0.0–0.2)

## 2023-12-12 LAB — CMP (CANCER CENTER ONLY)
ALT: 31 U/L (ref 0–44)
AST: 37 U/L (ref 15–41)
Albumin: 4 g/dL (ref 3.5–5.0)
Alkaline Phosphatase: 141 U/L — ABNORMAL HIGH (ref 38–126)
Anion gap: 4 — ABNORMAL LOW (ref 5–15)
BUN: 12 mg/dL (ref 8–23)
CO2: 30 mmol/L (ref 22–32)
Calcium: 9.3 mg/dL (ref 8.9–10.3)
Chloride: 105 mmol/L (ref 98–111)
Creatinine: 1.04 mg/dL (ref 0.61–1.24)
GFR, Estimated: >60 mL/min (ref >60)
Glucose, Bld: 108 mg/dL — ABNORMAL HIGH (ref 70–99)
Potassium: 4 mmol/L (ref 3.5–5.1)
Sodium: 139 mmol/L (ref 135–145)
Total Bilirubin: 0.5 mg/dL (ref 0.0–1.2)
Total Protein: 6.6 g/dL (ref 6.5–8.1)

## 2023-12-12 MED ORDER — SODIUM CHLORIDE 0.9 % IV SOLN: INTRAVENOUS | Status: DC

## 2023-12-12 MED ORDER — COLD PACK MISC ONCOLOGY
1.0000 | Freq: Once | Status: DC | PRN
Start: 2023-12-12 — End: 2023-12-12

## 2023-12-12 MED ORDER — SODIUM CHLORIDE 0.9 % IV SOLN
1000.0000 mg/m2 | Freq: Once | INTRAVENOUS | Status: AC
  Administered 2023-12-12: 2014 mg via INTRAVENOUS
  Filled 2023-12-12: qty 52.97

## 2023-12-12 MED ORDER — SODIUM CHLORIDE 0.9% FLUSH
10.0000 mL | INTRAVENOUS | Status: DC | PRN
Start: 2023-12-12 — End: 2023-12-12
  Administered 2023-12-12: 10 mL

## 2023-12-12 MED ORDER — HEPARIN SOD (PORK) LOCK FLUSH 100 UNIT/ML IV SOLN
500.0000 [IU] | Freq: Once | INTRAVENOUS | Status: AC | PRN
  Administered 2023-12-12: 500 [IU]

## 2023-12-12 MED ORDER — PACLITAXEL PROTEIN-BOUND CHEMO INJECTION 100 MG
100.0000 mg/m2 | Freq: Once | INTRAVENOUS | Status: AC
  Administered 2023-12-12: 200 mg via INTRAVENOUS
  Filled 2023-12-12: qty 40

## 2023-12-12 MED ORDER — PROCHLORPERAZINE MALEATE 10 MG PO TABS
10.0000 mg | ORAL_TABLET | Freq: Once | ORAL | Status: AC
Start: 2023-12-12 — End: 2023-12-12
  Administered 2023-12-12: 10 mg via ORAL
  Filled 2023-12-12: qty 1

## 2023-12-12 MED ORDER — SODIUM CHLORIDE 0.9% FLUSH
10.0000 mL | Freq: Once | INTRAVENOUS | Status: AC
  Administered 2023-12-12: 10 mL
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
## 2023-12-12 NOTE — Patient Instructions (Signed)
[facility] - A DEPT OF [facility]  Discharge Instructions: Thank you for choosing [facility] to provide your oncology and hematology care.   If you have a lab appointment with the Cancer Center, please go directly to the Cancer Center and check in at the registration area.   Wear comfortable clothing and clothing appropriate for easy access to any Portacath or PICC line.   We strive to give you quality time with your provider. You may need to reschedule your appointment if you arrive late (15 or more minutes).  Arriving late affects you and other patients whose appointments are after yours.  Also, if you miss three or more appointments without notifying the office, you may be dismissed from the clinic at the provider's discretion.      For prescription refill requests, have your pharmacy contact our office and allow 72 hours for refills to be completed.    Today you received the following chemotherapy and/or immunotherapy agents abraxane and gemzar.      To help prevent nausea and vomiting after your treatment, we encourage you to take your nausea medication as directed.  BELOW ARE SYMPTOMS THAT SHOULD BE REPORTED IMMEDIATELY: *FEVER GREATER THAN 100.4 F (38 C) OR HIGHER *CHILLS OR SWEATING *NAUSEA AND VOMITING THAT IS NOT CONTROLLED WITH YOUR NAUSEA MEDICATION *UNUSUAL SHORTNESS OF BREATH *UNUSUAL BRUISING OR BLEEDING *URINARY PROBLEMS (pain or burning when urinating, or frequent urination) *BOWEL PROBLEMS (unusual diarrhea, constipation, pain near the anus) TENDERNESS IN MOUTH AND THROAT WITH OR WITHOUT PRESENCE OF ULCERS (sore throat, sores in mouth, or a toothache) UNUSUAL RASH, SWELLING OR PAIN  UNUSUAL VAGINAL DISCHARGE OR ITCHING   Items with * indicate a potential emergency and should be followed up as soon as possible or go to the Emergency Department if any problems should occur.  Please show the CHEMOTHERAPY ALERT CARD or  IMMUNOTHERAPY ALERT CARD at check-in to the Emergency Department and triage nurse.  Should you have questions after your visit or need to cancel or reschedule your appointment, please contact [facility] - A DEPT [facility]  Dept: [phone]  and follow the prompts.  Office hours are 8:00 a.m. to 4:30 p.m. Monday - Friday. Please note that voicemails left after 4:00 p.m. may not be returned until the following business day.  We are closed weekends and major holidays. You have access to a nurse at all times for urgent questions. Please call the main number to the clinic Dept: [phone] and follow the prompts.   For any non-urgent questions, you may also contact your provider using MyChart. We now offer e-Visits for anyone 71 and older to request care online for non-urgent symptoms. For details visit [email or web address].   Also download the MyChart app! Go to the app store, search "MyChart", open the app, select , and log in with your MyChart username and password.  Paclitaxel Nanoparticle Albumin-Bound Injection What is this medication? NANOPARTICLE ALBUMIN-BOUND PACLITAXEL (Na no PAHR ti kuhl al BYOO muhn-bound PAK li TAX el) treats some types of cancer. It works by slowing down the growth of cancer cells. This medicine may be used for other purposes; ask your health care provider or pharmacist if you have questions. COMMON BRAND NAME(S): Abraxane What should I tell my care team before I take this medication? They need to know if you have any of these conditions: Liver disease Low white blood cell  levels An unusual or allergic reaction to paclitaxel, albumin, other medications, foods, dyes, or preservatives If you or your partner are pregnant or trying to get pregnant Breast-feeding How should I use this medication? This medication is injected into a vein. It is given by your care team in a hospital or clinic setting. Talk to your care  team about the use of this medication in children. Special care may be needed. Overdosage: If you think you have taken too much of this medicine contact a poison control center or emergency room at once. NOTE: This medicine is only for you. Do not share this medicine with others. What if I miss a dose? Keep appointments for follow-up doses. It is important not to miss your dose. Call your care team if you are unable to keep an appointment. What may interact with this medication? Other medications may affect the way this medication works. Talk with your care team about all of the medications you take. They may suggest changes to your treatment plan to lower the risk of side effects and to make sure your medications work as intended. This list may not describe all possible interactions. Give your health care provider a list of all the medicines, herbs, non-prescription drugs, or dietary supplements you use. Also tell them if you smoke, drink alcohol, or use illegal drugs. Some items may interact with your medicine. What should I watch for while using this medication? Your condition will be monitored carefully while you are receiving this medication. You may need blood work while taking this medication. This medication may make you feel generally unwell. This is not uncommon as chemotherapy can affect healthy cells as well as cancer cells. Report any side effects. Continue your course of treatment even though you feel ill unless your care team tells you to stop. This medication can cause serious allergic reactions. To reduce the risk, your care team may give you other medications to take before receiving this one. Be sure to follow the directions from your care team. This medication may increase your risk of getting an infection. Call your care team for advice if you get a fever, chills, sore throat, or other symptoms of a cold or flu. Do not treat yourself. Try to avoid being around people who are  sick. This medication may increase your risk to bruise or bleed. Call your care team if you notice any unusual bleeding. Be careful brushing or flossing your teeth or using a toothpick because you may get an infection or bleed more easily. If you have any dental work done, tell your dentist you are receiving this medication. Talk to your care team if you or your partner may be pregnant. Serious birth defects can occur if you take this medication during pregnancy and for 6 months after the last dose. You will need a negative pregnancy test before starting this medication. Contraception is recommended while taking this medication and for 6 months after the last dose. Your care team can help you find the option that works for you. If your partner can get pregnant, use a condom during sex while taking this medication and for 3 months after the last dose. Do not breastfeed while taking this medication and for 2 weeks after the last dose. This medication may cause infertility. Talk to your care team if you are concerned about your fertility. What side effects may I notice from receiving this medication? Side effects that you should report to your care team as soon as possible:  Allergic reactions--skin rash, itching, hives, swelling of the face, lips, tongue, or throat Dry cough, shortness of breath or trouble breathing Infection--fever, chills, cough, sore throat, wounds that don't heal, pain or trouble when passing urine, general feeling of discomfort or being unwell Low red blood cell level--unusual weakness or fatigue, dizziness, headache, trouble breathing Pain, tingling, or numbness in the hands or feet Stomach pain, unusual weakness or fatigue, nausea, vomiting, diarrhea, or fever that lasts longer than expected Unusual bruising or bleeding Side effects that usually do not require medical attention (report to your care team if they continue or are bothersome): Diarrhea Fatigue Hair loss Loss of  appetite Nausea Vomiting This list may not describe all possible side effects. Call your doctor for medical advice about side effects. You may report side effects to FDA at 1-800-FDA-1088. Where should I keep my medication? This medication is given in a hospital or clinic. It will not be stored at home. NOTE: This sheet is a summary. It may not cover all possible information. If you have questions about this medicine, talk to your doctor, pharmacist, or health care provider.  2024 Elsevier/Gold Standard (2022-03-18 00:00:00)  Gemcitabine Injection What is this medication? GEMCITABINE (jem SYE ta been) treats some types of cancer. It works by slowing down the growth of cancer cells. This medicine may be used for other purposes; ask your health care provider or pharmacist if you have questions. COMMON BRAND NAME(S): Gemzar, Infugem What should I tell my care team before I take this medication? They need to know if you have any of these conditions: Blood disorders Infection Kidney disease Liver disease Lung or breathing disease, such as asthma or COPD Recent or ongoing radiation therapy An unusual or allergic reaction to gemcitabine, other medications, foods, dyes, or preservatives If you or your partner are pregnant or trying to get pregnant Breast-feeding How should I use this medication? This medication is injected into a vein. It is given by your care team in a hospital or clinic setting. Talk to your care team about the use of this medication in children. Special care may be needed. Overdosage: If you think you have taken too much of this medicine contact a poison control center or emergency room at once. NOTE: This medicine is only for you. Do not share this medicine with others. What if I miss a dose? Keep appointments for follow-up doses. It is important not to miss your dose. Call your care team if you are unable to keep an appointment. What may interact with this  medication? Interactions have not been studied. This list may not describe all possible interactions. Give your health care provider a list of all the medicines, herbs, non-prescription drugs, or dietary supplements you use. Also tell them if you smoke, drink alcohol, or use illegal drugs. Some items may interact with your medicine. What should I watch for while using this medication? Your condition will be monitored carefully while you are receiving this medication. This medication may make you feel generally unwell. This is not uncommon, as chemotherapy can affect healthy cells as well as cancer cells. Report any side effects. Continue your course of treatment even though you feel ill unless your care team tells you to stop. In some cases, you may be given additional medications to help with side effects. Follow all directions for their use. This medication may increase your risk of getting an infection. Call your care team for advice if you get a fever, chills, sore throat, or other  symptoms of a cold or flu. Do not treat yourself. Try to avoid being around people who are sick. This medication may increase your risk to bruise or bleed. Call your care team if you notice any unusual bleeding. Be careful brushing or flossing your teeth or using a toothpick because you may get an infection or bleed more easily. If you have any dental work done, tell your dentist you are receiving this medication. Avoid taking medications that contain aspirin, acetaminophen, ibuprofen, naproxen, or ketoprofen unless instructed by your care team. These medications may hide a fever. Talk to your care team if you or your partner wish to become pregnant or think you might be pregnant. This medication can cause serious birth defects if taken during pregnancy and for 6 months after the last dose. A negative pregnancy test is required before starting this medication. A reliable form of contraception is recommended while taking  this medication and for 6 months after the last dose. Talk to your care team about effective forms of contraception. Do not father a child while taking this medication and for 3 months after the last dose. Use a condom while having sex during this time period. Do not breastfeed while taking this medication and for at least 1 week after the last dose. This medication may cause infertility. Talk to your care team if you are concerned about your fertility. What side effects may I notice from receiving this medication? Side effects that you should report to your care team as soon as possible: Allergic reactions--skin rash, itching, hives, swelling of the face, lips, tongue, or throat Capillary leak syndrome--stomach or muscle pain, unusual weakness or fatigue, feeling faint or lightheaded, decrease in the amount of urine, swelling of the ankles, hands, or feet, trouble breathing Infection--fever, chills, cough, sore throat, wounds that don't heal, pain or trouble when passing urine, general feeling of discomfort or being unwell Liver injury--right upper belly pain, loss of appetite, nausea, light-colored stool, dark yellow or brown urine, yellowing skin or eyes, unusual weakness or fatigue Low red blood cell level--unusual weakness or fatigue, dizziness, headache, trouble breathing Lung injury--shortness of breath or trouble breathing, cough, spitting up blood, chest pain, fever Stomach pain, bloody diarrhea, pale skin, unusual weakness or fatigue, decrease in the amount of urine, which may be signs of hemolytic uremic syndrome Sudden and severe headache, confusion, change in vision, seizures, which may be signs of posterior reversible encephalopathy syndrome (PRES) Unusual bruising or bleeding Side effects that usually do not require medical attention (report to your care team if they continue or are bothersome): Diarrhea Drowsiness Hair loss Nausea Pain, redness, or swelling with sores inside the  mouth or throat Vomiting This list may not describe all possible side effects. Call your doctor for medical advice about side effects. You may report side effects to FDA at 1-800-FDA-1088. Where should I keep my medication? This medication is given in a hospital or clinic. It will not be stored at home. NOTE: This sheet is a summary. It may not cover all possible information. If you have questions about this medicine, talk to your doctor, pharmacist, or health care provider.  2024 Elsevier/Gold Standard (2022-03-09 00:00:00)

## 2023-12-14 ENCOUNTER — Ambulatory Visit: Payer: Medicare Other

## 2023-12-14 ENCOUNTER — Telehealth: Payer: Self-pay

## 2023-12-14 VITALS — BP 120/70 | HR 78 | Ht 68.0 in | Wt 190.2 lb

## 2023-12-14 DIAGNOSIS — Z0000 Encounter for general adult medical examination without abnormal findings: Secondary | ICD-10-CM | POA: Diagnosis not present

## 2023-12-14 NOTE — Patient Instructions (Addendum)
[name] , Thank you for taking time to come for your Medicare Wellness Visit. I appreciate your ongoing commitment to your health goals. Please review the following plan we discussed and let me know if I can assist you in the future.   Referrals/Orders/Follow-Ups/Clinician Recommendations: It was nice to meet you today.  Each day, aim for 6 glasses of water, plenty of protein in your diet and try to get up and walk/ stretch every hour for 5-10 minutes at a time.    This is a list of the screening recommended for you and due dates:  Health Maintenance  Topic Date Due   COVID-19 Vaccine (5 - 2024-25 season) 07/17/2023   Medicare Annual Wellness Visit  12/13/2024   DTaP/Tdap/Td vaccine (3 - Td or Tdap) 03/24/2025   Colon Cancer Screening  06/26/2029   Pneumonia Vaccine  Completed   Flu Shot  Completed   Hepatitis C Screening  Completed   Zoster (Shingles) Vaccine  Completed   HPV Vaccine  Aged Out    Advanced directives: (Copy Requested) Please bring a copy of your health care power of attorney and living will to the office to be added to your chart at your convenience.  Next Medicare Annual Wellness Visit scheduled for next year: Yes

## 2023-12-14 NOTE — Telephone Encounter (Signed)
Unfortunately since I have not seen him for this he either needs to come in for an appointment for call Dr. Orlan Leavens and ask him for pain medication

## 2023-12-14 NOTE — Telephone Encounter (Signed)
Patient is asking for some pain medication for Rt knee pain and wrist pain.  He was seen by Dr. Melvyn Novas on 11/22/23 for his wrist, which he was not prescribed anything for the pain.  Patient stated that he thought that he was going to see Dr. Lawerance Bach today and was going to explain his concerns.
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
## 2023-12-15 NOTE — Telephone Encounter (Signed)
Spoke with patient today and advice given.   He did not want to make an appointment and became upset that we would not be able to send in anything for him.  Relayed to him that we are not able to send in medication for a problem he has never been treated for.  Patient stated to forget it and hung up.

## 2023-12-21 ENCOUNTER — Inpatient Hospital Stay: Payer: Medicare Other

## 2023-12-21 ENCOUNTER — Inpatient Hospital Stay: Payer: Medicare Other | Attending: Hematology | Admitting: Physician Assistant

## 2023-12-21 ENCOUNTER — Inpatient Hospital Stay: Payer: Medicare Other | Attending: Hematology

## 2023-12-21 VITALS — BP 129/68 | HR 68 | Temp 98.1°F | Resp 16 | Ht 68.0 in | Wt 189.1 lb

## 2023-12-21 DIAGNOSIS — Z5111 Encounter for antineoplastic chemotherapy: Secondary | ICD-10-CM | POA: Insufficient documentation

## 2023-12-21 DIAGNOSIS — C250 Malignant neoplasm of head of pancreas: Secondary | ICD-10-CM | POA: Insufficient documentation

## 2023-12-21 DIAGNOSIS — C259 Malignant neoplasm of pancreas, unspecified: Secondary | ICD-10-CM

## 2023-12-21 DIAGNOSIS — Z95828 Presence of other vascular implants and grafts: Secondary | ICD-10-CM

## 2023-12-21 DIAGNOSIS — K8689 Other specified diseases of pancreas: Secondary | ICD-10-CM

## 2023-12-21 LAB — CBC WITH DIFFERENTIAL (CANCER CENTER ONLY)
Abs Immature Granulocytes: 0.03 10*3/uL (ref 0.00–0.07)
Basophils Absolute: 0 10*3/uL (ref 0.0–0.1)
Basophils Relative: 1 %
Eosinophils Absolute: 0.1 10*3/uL (ref 0.0–0.5)
Eosinophils Relative: 3 %
HCT: 33.3 % — ABNORMAL LOW (ref 39.0–52.0)
Hemoglobin: 11.1 g/dL — ABNORMAL LOW (ref 13.0–17.0)
Immature Granulocytes: 1 %
Lymphocytes Relative: 26 %
Lymphs Abs: 0.8 10*3/uL (ref 0.7–4.0)
MCH: 29.4 pg (ref 26.0–34.0)
MCHC: 33.3 g/dL (ref 30.0–36.0)
MCV: 88.3 fL (ref 80.0–100.0)
Monocytes Absolute: 0.4 10*3/uL (ref 0.1–1.0)
Monocytes Relative: 12 %
Neutro Abs: 1.7 10*3/uL (ref 1.7–7.7)
Neutrophils Relative %: 57 %
Platelet Count: 93 10*3/uL — ABNORMAL LOW (ref 150–400)
RBC: 3.77 MIL/uL — ABNORMAL LOW (ref 4.22–5.81)
RDW: 13.1 % (ref 11.5–15.5)
WBC Count: 3 10*3/uL — ABNORMAL LOW (ref 4.0–10.5)
nRBC: 0 % (ref 0.0–0.2)

## 2023-12-21 LAB — CMP (CANCER CENTER ONLY)
ALT: 35 U/L (ref 0–44)
AST: 37 U/L (ref 15–41)
Albumin: 3.7 g/dL (ref 3.5–5.0)
Alkaline Phosphatase: 140 U/L — ABNORMAL HIGH (ref 38–126)
Anion gap: 3 — ABNORMAL LOW (ref 5–15)
BUN: 9 mg/dL (ref 8–23)
CO2: 28 mmol/L (ref 22–32)
Calcium: 8.9 mg/dL (ref 8.9–10.3)
Chloride: 109 mmol/L (ref 98–111)
Creatinine: 0.96 mg/dL (ref 0.61–1.24)
GFR, Estimated: >60 mL/min (ref >60)
Glucose, Bld: 127 mg/dL — ABNORMAL HIGH (ref 70–99)
Potassium: 4.2 mmol/L (ref 3.5–5.1)
Sodium: 140 mmol/L (ref 135–145)
Total Bilirubin: 0.4 mg/dL (ref 0.0–1.2)
Total Protein: 6.4 g/dL — ABNORMAL LOW (ref 6.5–8.1)

## 2023-12-21 MED ORDER — PROCHLORPERAZINE MALEATE 10 MG PO TABS
10.0000 mg | ORAL_TABLET | Freq: Once | ORAL | Status: AC
  Administered 2023-12-21: 10 mg via ORAL
  Filled 2023-12-21: qty 1

## 2023-12-21 MED ORDER — SODIUM CHLORIDE 0.9 % IV SOLN
1000.0000 mg/m2 | Freq: Once | INTRAVENOUS | Status: AC
  Administered 2023-12-21: 2014 mg via INTRAVENOUS
  Filled 2023-12-21: qty 52.97

## 2023-12-21 MED ORDER — PACLITAXEL PROTEIN-BOUND CHEMO INJECTION 100 MG
100.0000 mg/m2 | Freq: Once | INTRAVENOUS | Status: AC
  Administered 2023-12-21: 200 mg via INTRAVENOUS
  Filled 2023-12-21: qty 40

## 2023-12-21 MED ORDER — SODIUM CHLORIDE 0.9 % IV SOLN: INTRAVENOUS | Status: DC

## 2023-12-21 MED ORDER — SODIUM CHLORIDE 0.9% FLUSH
10.0000 mL | Freq: Once | INTRAVENOUS | Status: AC
  Administered 2023-12-21: 10 mL
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
## 2023-12-21 NOTE — Patient Instructions (Signed)
 [facility] - A DEPT OF [facility]  Discharge Instructions: Thank you for choosing [facility] to provide your oncology and hematology care.   If you have a lab appointment with the Cancer Center, please go directly to the Cancer Center and check in at the registration area.   Wear comfortable clothing and clothing appropriate for easy access to any Portacath or PICC line.   We strive to give you quality time with your provider. You may need to reschedule your appointment if you arrive late (15 or more minutes).  Arriving late affects you and other patients whose appointments are after yours.  Also, if you miss three or more appointments without notifying the office, you may be dismissed from the clinic at the provider's discretion.      For prescription refill requests, have your pharmacy contact our office and allow 72 hours for refills to be completed.    Today you received the following chemotherapy and/or immunotherapy agents abraxane and gemzar.      To help prevent nausea and vomiting after your treatment, we encourage you to take your nausea medication as directed.  BELOW ARE SYMPTOMS THAT SHOULD BE REPORTED IMMEDIATELY: *FEVER GREATER THAN 100.4 F (38 C) OR HIGHER *CHILLS OR SWEATING *NAUSEA AND VOMITING THAT IS NOT CONTROLLED WITH YOUR NAUSEA MEDICATION *UNUSUAL SHORTNESS OF BREATH *UNUSUAL BRUISING OR BLEEDING *URINARY PROBLEMS (pain or burning when urinating, or frequent urination) *BOWEL PROBLEMS (unusual diarrhea, constipation, pain near the anus) TENDERNESS IN MOUTH AND THROAT WITH OR WITHOUT PRESENCE OF ULCERS (sore throat, sores in mouth, or a toothache) UNUSUAL RASH, SWELLING OR PAIN  UNUSUAL VAGINAL DISCHARGE OR ITCHING   Items with * indicate a potential emergency and should be followed up as soon as possible or go to the Emergency Department if any problems should occur.  Please show the CHEMOTHERAPY ALERT CARD or  IMMUNOTHERAPY ALERT CARD at check-in to the Emergency Department and triage nurse.  Should you have questions after your visit or need to cancel or reschedule your appointment, please contact [facility] - A DEPT OF [facility]  Dept: [phone]  and follow the prompts.  Office hours are 8:00 a.m. to 4:30 p.m. Monday - Friday. Please note that voicemails left after 4:00 p.m. may not be returned until the following business day.  We are closed weekends and major holidays. You have access to a nurse at all times for urgent questions. Please call the main number to the clinic Dept: [phone] and follow the prompts.   For any non-urgent questions, you may also contact your provider using MyChart. We now offer e-Visits for anyone 71 and older to request care online for non-urgent symptoms. For details visit [email or web address].   Also download the MyChart app! Go to the app store, search "MyChart", open the app, select , and log in with your MyChart username and password.  Paclitaxel Nanoparticle Albumin-Bound Injection What is this medication? NANOPARTICLE ALBUMIN-BOUND PACLITAXEL (Na no PAHR ti kuhl al BYOO muhn-bound PAK li TAX el) treats some types of cancer. It works by slowing down the growth of cancer cells. This medicine may be used for other purposes; ask your health care provider or pharmacist if you have questions. COMMON BRAND NAME(S): Abraxane What should I tell my care team before I take this medication? They need to know if you have any of these conditions: Liver disease Low white blood cell  levels An unusual or allergic reaction to paclitaxel, albumin, other medications, foods, dyes, or preservatives If you or your partner are pregnant or trying to get pregnant Breast-feeding How should I use this medication? This medication is injected into a vein. It is given by your care team in a hospital or clinic setting. Talk to your care  team about the use of this medication in children. Special care may be needed. Overdosage: If you think you have taken too much of this medicine contact a poison control center or emergency room at once. NOTE: This medicine is only for you. Do not share this medicine with others. What if I miss a dose? Keep appointments for follow-up doses. It is important not to miss your dose. Call your care team if you are unable to keep an appointment. What may interact with this medication? Other medications may affect the way this medication works. Talk with your care team about all of the medications you take. They may suggest changes to your treatment plan to lower the risk of side effects and to make sure your medications work as intended. This list may not describe all possible interactions. Give your health care provider a list of all the medicines, herbs, non-prescription drugs, or dietary supplements you use. Also tell them if you smoke, drink alcohol, or use illegal drugs. Some items may interact with your medicine. What should I watch for while using this medication? Your condition will be monitored carefully while you are receiving this medication. You may need blood work while taking this medication. This medication may make you feel generally unwell. This is not uncommon as chemotherapy can affect healthy cells as well as cancer cells. Report any side effects. Continue your course of treatment even though you feel ill unless your care team tells you to stop. This medication can cause serious allergic reactions. To reduce the risk, your care team may give you other medications to take before receiving this one. Be sure to follow the directions from your care team. This medication may increase your risk of getting an infection. Call your care team for advice if you get a fever, chills, sore throat, or other symptoms of a cold or flu. Do not treat yourself. Try to avoid being around people who are  sick. This medication may increase your risk to bruise or bleed. Call your care team if you notice any unusual bleeding. Be careful brushing or flossing your teeth or using a toothpick because you may get an infection or bleed more easily. If you have any dental work done, tell your dentist you are receiving this medication. Talk to your care team if you or your partner may be pregnant. Serious birth defects can occur if you take this medication during pregnancy and for 6 months after the last dose. You will need a negative pregnancy test before starting this medication. Contraception is recommended while taking this medication and for 6 months after the last dose. Your care team can help you find the option that works for you. If your partner can get pregnant, use a condom during sex while taking this medication and for 3 months after the last dose. Do not breastfeed while taking this medication and for 2 weeks after the last dose. This medication may cause infertility. Talk to your care team if you are concerned about your fertility. What side effects may I notice from receiving this medication? Side effects that you should report to your care team as soon as possible:  Allergic reactions--skin rash, itching, hives, swelling of the face, lips, tongue, or throat Dry cough, shortness of breath or trouble breathing Infection--fever, chills, cough, sore throat, wounds that don't heal, pain or trouble when passing urine, general feeling of discomfort or being unwell Low red blood cell level--unusual weakness or fatigue, dizziness, headache, trouble breathing Pain, tingling, or numbness in the hands or feet Stomach pain, unusual weakness or fatigue, nausea, vomiting, diarrhea, or fever that lasts longer than expected Unusual bruising or bleeding Side effects that usually do not require medical attention (report to your care team if they continue or are bothersome): Diarrhea Fatigue Hair loss Loss of  appetite Nausea Vomiting This list may not describe all possible side effects. Call your doctor for medical advice about side effects. You may report side effects to FDA at 1-800-FDA-1088. Where should I keep my medication? This medication is given in a hospital or clinic. It will not be stored at home. NOTE: This sheet is a summary. It may not cover all possible information. If you have questions about this medicine, talk to your doctor, pharmacist, or health care provider.  2024 Elsevier/Gold Standard (2022-03-18 00:00:00)  Gemcitabine Injection What is this medication? GEMCITABINE (jem SYE ta been) treats some types of cancer. It works by slowing down the growth of cancer cells. This medicine may be used for other purposes; ask your health care provider or pharmacist if you have questions. COMMON BRAND NAME(S): Gemzar, Infugem What should I tell my care team before I take this medication? They need to know if you have any of these conditions: Blood disorders Infection Kidney disease Liver disease Lung or breathing disease, such as asthma or COPD Recent or ongoing radiation therapy An unusual or allergic reaction to gemcitabine, other medications, foods, dyes, or preservatives If you or your partner are pregnant or trying to get pregnant Breast-feeding How should I use this medication? This medication is injected into a vein. It is given by your care team in a hospital or clinic setting. Talk to your care team about the use of this medication in children. Special care may be needed. Overdosage: If you think you have taken too much of this medicine contact a poison control center or emergency room at once. NOTE: This medicine is only for you. Do not share this medicine with others. What if I miss a dose? Keep appointments for follow-up doses. It is important not to miss your dose. Call your care team if you are unable to keep an appointment. What may interact with this  medication? Interactions have not been studied. This list may not describe all possible interactions. Give your health care provider a list of all the medicines, herbs, non-prescription drugs, or dietary supplements you use. Also tell them if you smoke, drink alcohol, or use illegal drugs. Some items may interact with your medicine. What should I watch for while using this medication? Your condition will be monitored carefully while you are receiving this medication. This medication may make you feel generally unwell. This is not uncommon, as chemotherapy can affect healthy cells as well as cancer cells. Report any side effects. Continue your course of treatment even though you feel ill unless your care team tells you to stop. In some cases, you may be given additional medications to help with side effects. Follow all directions for their use. This medication may increase your risk of getting an infection. Call your care team for advice if you get a fever, chills, sore throat, or other  symptoms of a cold or flu. Do not treat yourself. Try to avoid being around people who are sick. This medication may increase your risk to bruise or bleed. Call your care team if you notice any unusual bleeding. Be careful brushing or flossing your teeth or using a toothpick because you may get an infection or bleed more easily. If you have any dental work done, tell your dentist you are receiving this medication. Avoid taking medications that contain aspirin, acetaminophen, ibuprofen, naproxen, or ketoprofen unless instructed by your care team. These medications may hide a fever. Talk to your care team if you or your partner wish to become pregnant or think you might be pregnant. This medication can cause serious birth defects if taken during pregnancy and for 6 months after the last dose. A negative pregnancy test is required before starting this medication. A reliable form of contraception is recommended while taking  this medication and for 6 months after the last dose. Talk to your care team about effective forms of contraception. Do not father a child while taking this medication and for 3 months after the last dose. Use a condom while having sex during this time period. Do not breastfeed while taking this medication and for at least 1 week after the last dose. This medication may cause infertility. Talk to your care team if you are concerned about your fertility. What side effects may I notice from receiving this medication? Side effects that you should report to your care team as soon as possible: Allergic reactions--skin rash, itching, hives, swelling of the face, lips, tongue, or throat Capillary leak syndrome--stomach or muscle pain, unusual weakness or fatigue, feeling faint or lightheaded, decrease in the amount of urine, swelling of the ankles, hands, or feet, trouble breathing Infection--fever, chills, cough, sore throat, wounds that don't heal, pain or trouble when passing urine, general feeling of discomfort or being unwell Liver injury--right upper belly pain, loss of appetite, nausea, light-colored stool, dark yellow or brown urine, yellowing skin or eyes, unusual weakness or fatigue Low red blood cell level--unusual weakness or fatigue, dizziness, headache, trouble breathing Lung injury--shortness of breath or trouble breathing, cough, spitting up blood, chest pain, fever Stomach pain, bloody diarrhea, pale skin, unusual weakness or fatigue, decrease in the amount of urine, which may be signs of hemolytic uremic syndrome Sudden and severe headache, confusion, change in vision, seizures, which may be signs of posterior reversible encephalopathy syndrome (PRES) Unusual bruising or bleeding Side effects that usually do not require medical attention (report to your care team if they continue or are bothersome): Diarrhea Drowsiness Hair loss Nausea Pain, redness, or swelling with sores inside the  mouth or throat Vomiting This list may not describe all possible side effects. Call your doctor for medical advice about side effects. You may report side effects to FDA at 1-800-FDA-1088. Where should I keep my medication? This medication is given in a hospital or clinic. It will not be stored at home. NOTE: This sheet is a summary. It may not cover all possible information. If you have questions about this medicine, talk to your doctor, pharmacist, or health care provider.  2024 Elsevier/Gold Standard (2022-03-09 00:00:00)

## 2024-01-02 ENCOUNTER — Inpatient Hospital Stay: Payer: Medicare Other | Admitting: Hematology

## 2024-01-02 ENCOUNTER — Telehealth: Payer: Self-pay | Admitting: Hematology

## 2024-01-02 ENCOUNTER — Inpatient Hospital Stay: Payer: Medicare Other

## 2024-01-02 NOTE — Telephone Encounter (Signed)
Was able to reach patient's daughter and she was able to put me through to patient in regards to scheduled appointment times/dates, both patient and patient's daughter are aware of these changes

## 2024-01-02 NOTE — Assessment & Plan Note (Signed)
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 [name] showed 1.5 cm in pancreatic head, cytology confirmed adenocarcinoma.  An uncovered metal stent was placed in the common bile duct by Dr. Ewing Schlein -he completed 3 months neoadjuvant FOLFIRINOX on 06/08/22 - 09/09/22, he tolerated well. Chemo changed to FOLFIRI with liposomal irinotecan afterward  -restaging CT AP on 09/02/22 showed similar small amount of abnormal soft tissue adjacent to common bile duct stent (which is appropriately located), otherwise no new lesions or definitive signs of metastatic disease.  -We reviewed his case in GI tumor conference, unfortunately due to the tumor invasion of hepatic artery, our surgeons feel his pancreatic cancer is not resectable.  -His recent restaging CT scan at [facility] showed persistent tumor invasion of hepatic artery, Dr. [name] recommend SBRT, he completed at [facility]  -He was recently hospitalized for hyperbilirubinemia due to stent occlusion, and underwent ERCP again and stent exchange.  MRCP was done in the hospital, which showed no discrete pancreatic mass or evidence of metastasis. -Underwent Whipple with reconstruction of portal vein with saphenous graft 04/14/23 at [facility]  -Unfortunately he developed a local recurrence in December 2024 -He started chemo gemcitabine and Abraxne on 12/12/2023

## 2024-01-03 ENCOUNTER — Inpatient Hospital Stay: Payer: Medicare Other

## 2024-01-03 ENCOUNTER — Inpatient Hospital Stay: Payer: Medicare Other | Admitting: Hematology

## 2024-01-03 ENCOUNTER — Encounter: Payer: Self-pay | Admitting: Hematology

## 2024-01-03 VITALS — BP 143/72 | HR 66 | Temp 98.6°F | Resp 17 | Wt 195.8 lb

## 2024-01-03 DIAGNOSIS — Z95828 Presence of other vascular implants and grafts: Secondary | ICD-10-CM

## 2024-01-03 DIAGNOSIS — Z5111 Encounter for antineoplastic chemotherapy: Secondary | ICD-10-CM | POA: Diagnosis not present

## 2024-01-03 DIAGNOSIS — C259 Malignant neoplasm of pancreas, unspecified: Secondary | ICD-10-CM

## 2024-01-03 DIAGNOSIS — C250 Malignant neoplasm of head of pancreas: Secondary | ICD-10-CM | POA: Diagnosis not present

## 2024-01-03 LAB — CBC WITH DIFFERENTIAL (CANCER CENTER ONLY)
Abs Immature Granulocytes: 0.02 10*3/uL (ref 0.00–0.07)
Basophils Absolute: 0 10*3/uL (ref 0.0–0.1)
Basophils Relative: 1 %
Eosinophils Absolute: 0.4 10*3/uL (ref 0.0–0.5)
Eosinophils Relative: 13 %
HCT: 30.6 % — ABNORMAL LOW (ref 39.0–52.0)
Hemoglobin: 10.2 g/dL — ABNORMAL LOW (ref 13.0–17.0)
Immature Granulocytes: 1 %
Lymphocytes Relative: 29 %
Lymphs Abs: 0.8 10*3/uL (ref 0.7–4.0)
MCH: 29.9 pg (ref 26.0–34.0)
MCHC: 33.3 g/dL (ref 30.0–36.0)
MCV: 89.7 fL (ref 80.0–100.0)
Monocytes Absolute: 0.5 10*3/uL (ref 0.1–1.0)
Monocytes Relative: 18 %
Neutro Abs: 1.1 10*3/uL — ABNORMAL LOW (ref 1.7–7.7)
Neutrophils Relative %: 38 %
Platelet Count: 110 10*3/uL — ABNORMAL LOW (ref 150–400)
RBC: 3.41 MIL/uL — ABNORMAL LOW (ref 4.22–5.81)
RDW: 13.9 % (ref 11.5–15.5)
WBC Count: 2.7 10*3/uL — ABNORMAL LOW (ref 4.0–10.5)
nRBC: 0 % (ref 0.0–0.2)

## 2024-01-03 LAB — CMP (CANCER CENTER ONLY)
ALT: 29 U/L (ref 0–44)
AST: 31 U/L (ref 15–41)
Albumin: 3.7 g/dL (ref 3.5–5.0)
Alkaline Phosphatase: 129 U/L — ABNORMAL HIGH (ref 38–126)
Anion gap: 4 — ABNORMAL LOW (ref 5–15)
BUN: 12 mg/dL (ref 8–23)
CO2: 29 mmol/L (ref 22–32)
Calcium: 8.9 mg/dL (ref 8.9–10.3)
Chloride: 106 mmol/L (ref 98–111)
Creatinine: 1.03 mg/dL (ref 0.61–1.24)
GFR, Estimated: >60 mL/min (ref >60)
Glucose, Bld: 92 mg/dL (ref 70–99)
Potassium: 4.3 mmol/L (ref 3.5–5.1)
Sodium: 139 mmol/L (ref 135–145)
Total Bilirubin: 0.4 mg/dL (ref 0.0–1.2)
Total Protein: 6 g/dL — ABNORMAL LOW (ref 6.5–8.1)

## 2024-01-03 MED ORDER — SODIUM CHLORIDE 0.9% FLUSH
10.0000 mL | INTRAVENOUS | Status: DC | PRN
  Administered 2024-01-03: 10 mL

## 2024-01-03 MED ORDER — SODIUM CHLORIDE 0.9% FLUSH
10.0000 mL | Freq: Once | INTRAVENOUS | Status: AC
  Administered 2024-01-03: 10 mL

## 2024-01-03 MED ORDER — SODIUM CHLORIDE 0.9 % IV SOLN: INTRAVENOUS | Status: DC

## 2024-01-03 MED ORDER — SODIUM CHLORIDE 0.9 % IV SOLN
1000.0000 mg/m2 | Freq: Once | INTRAVENOUS | Status: AC
  Administered 2024-01-03: 2014 mg via INTRAVENOUS
  Filled 2024-01-03: qty 52.97

## 2024-01-03 MED ORDER — PROCHLORPERAZINE MALEATE 10 MG PO TABS
10.0000 mg | ORAL_TABLET | Freq: Once | ORAL | Status: AC
  Administered 2024-01-03: 10 mg via ORAL
  Filled 2024-01-03: qty 1

## 2024-01-03 MED ORDER — PACLITAXEL PROTEIN-BOUND CHEMO INJECTION 100 MG
100.0000 mg/m2 | Freq: Once | INTRAVENOUS | Status: AC
  Administered 2024-01-03: 200 mg via INTRAVENOUS
  Filled 2024-01-03: qty 40

## 2024-01-03 MED ORDER — HEPARIN SOD (PORK) LOCK FLUSH 100 UNIT/ML IV SOLN
500.0000 [IU] | Freq: Once | INTRAVENOUS | Status: AC | PRN
  Administered 2024-01-03: 500 [IU]
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
## 2024-01-04 ENCOUNTER — Inpatient Hospital Stay: Payer: Medicare Other | Admitting: Hematology

## 2024-01-04 ENCOUNTER — Inpatient Hospital Stay: Payer: Medicare Other

## 2024-01-11 ENCOUNTER — Inpatient Hospital Stay: Payer: Medicare Other

## 2024-01-11 ENCOUNTER — Other Ambulatory Visit: Payer: Self-pay | Admitting: *Deleted

## 2024-01-11 VITALS — BP 158/68 | HR 66 | Temp 98.5°F | Resp 20 | Wt 194.8 lb

## 2024-01-11 DIAGNOSIS — C259 Malignant neoplasm of pancreas, unspecified: Secondary | ICD-10-CM

## 2024-01-11 DIAGNOSIS — Z95828 Presence of other vascular implants and grafts: Secondary | ICD-10-CM

## 2024-01-11 DIAGNOSIS — Z5111 Encounter for antineoplastic chemotherapy: Secondary | ICD-10-CM | POA: Diagnosis not present

## 2024-01-11 DIAGNOSIS — C250 Malignant neoplasm of head of pancreas: Secondary | ICD-10-CM | POA: Diagnosis not present

## 2024-01-11 DIAGNOSIS — R519 Headache, unspecified: Secondary | ICD-10-CM

## 2024-01-11 LAB — CMP (CANCER CENTER ONLY)
ALT: 31 U/L (ref 0–44)
AST: 38 U/L (ref 15–41)
Albumin: 3.6 g/dL (ref 3.5–5.0)
Alkaline Phosphatase: 120 U/L (ref 38–126)
Anion gap: 4 — ABNORMAL LOW (ref 5–15)
BUN: 13 mg/dL (ref 8–23)
CO2: 26 mmol/L (ref 22–32)
Calcium: 8.6 mg/dL — ABNORMAL LOW (ref 8.9–10.3)
Chloride: 108 mmol/L (ref 98–111)
Creatinine: 0.99 mg/dL (ref 0.61–1.24)
GFR, Estimated: >60 mL/min (ref >60)
Glucose, Bld: 107 mg/dL — ABNORMAL HIGH (ref 70–99)
Potassium: 4.5 mmol/L (ref 3.5–5.1)
Sodium: 138 mmol/L (ref 135–145)
Total Bilirubin: 0.4 mg/dL (ref 0.0–1.2)
Total Protein: 6.1 g/dL — ABNORMAL LOW (ref 6.5–8.1)

## 2024-01-11 LAB — CBC WITH DIFFERENTIAL (CANCER CENTER ONLY)
Abs Immature Granulocytes: 0.13 10*3/uL — ABNORMAL HIGH (ref 0.00–0.07)
Basophils Absolute: 0 10*3/uL (ref 0.0–0.1)
Basophils Relative: 1 %
Eosinophils Absolute: 0.1 10*3/uL (ref 0.0–0.5)
Eosinophils Relative: 3 %
HCT: 28.6 % — ABNORMAL LOW (ref 39.0–52.0)
Hemoglobin: 9.7 g/dL — ABNORMAL LOW (ref 13.0–17.0)
Immature Granulocytes: 5 %
Lymphocytes Relative: 26 %
Lymphs Abs: 0.8 10*3/uL (ref 0.7–4.0)
MCH: 30.2 pg (ref 26.0–34.0)
MCHC: 33.9 g/dL (ref 30.0–36.0)
MCV: 89.1 fL (ref 80.0–100.0)
Monocytes Absolute: 0.5 10*3/uL (ref 0.1–1.0)
Monocytes Relative: 18 %
Neutro Abs: 1.4 10*3/uL — ABNORMAL LOW (ref 1.7–7.7)
Neutrophils Relative %: 47 %
Platelet Count: 146 10*3/uL — ABNORMAL LOW (ref 150–400)
RBC: 3.21 MIL/uL — ABNORMAL LOW (ref 4.22–5.81)
RDW: 13.8 % (ref 11.5–15.5)
WBC Count: 2.9 10*3/uL — ABNORMAL LOW (ref 4.0–10.5)
nRBC: 0 % (ref 0.0–0.2)

## 2024-01-11 MED ORDER — HEPARIN SOD (PORK) LOCK FLUSH 100 UNIT/ML IV SOLN
500.0000 [IU] | Freq: Once | INTRAVENOUS | Status: AC | PRN
Start: 2024-01-11 — End: 2024-01-11
  Administered 2024-01-11: 500 [IU]

## 2024-01-11 MED ORDER — SODIUM CHLORIDE 0.9% FLUSH
10.0000 mL | Freq: Once | INTRAVENOUS | Status: AC
Start: 2024-01-11 — End: 2024-01-11
  Administered 2024-01-11: 10 mL

## 2024-01-11 MED ORDER — SODIUM CHLORIDE 0.9 % IV SOLN
1000.0000 mg/m2 | Freq: Once | INTRAVENOUS | Status: AC
  Administered 2024-01-11: 2014 mg via INTRAVENOUS
  Filled 2024-01-11: qty 52.97

## 2024-01-11 MED ORDER — PROCHLORPERAZINE MALEATE 10 MG PO TABS
10.0000 mg | ORAL_TABLET | Freq: Once | ORAL | Status: AC
  Administered 2024-01-11: 10 mg via ORAL
  Filled 2024-01-11: qty 1

## 2024-01-11 MED ORDER — ACETAMINOPHEN 325 MG PO TABS: 650.0000 mg | ORAL_TABLET | Freq: Once | ORAL | Status: AC

## 2024-01-11 MED ORDER — ACETAMINOPHEN 325 MG PO TABS
650.0000 mg | ORAL_TABLET | Freq: Once | ORAL | Status: AC
  Administered 2024-01-11: 650 mg via ORAL
  Filled 2024-01-11: qty 2

## 2024-01-11 MED ORDER — SODIUM CHLORIDE 0.9 % IV SOLN: INTRAVENOUS | Status: DC

## 2024-01-11 MED ORDER — SODIUM CHLORIDE 0.9 % IV SOLN
INTRAVENOUS | Status: DC
Start: 2024-01-11 — End: 2024-01-11

## 2024-01-11 MED ORDER — ACETAMINOPHEN 325 MG PO TABS
650.0000 mg | ORAL_TABLET | Freq: Four times a day (QID) | ORAL | Status: AC | PRN
Start: 2024-01-11 — End: ?

## 2024-01-11 MED ORDER — SODIUM CHLORIDE 0.9% FLUSH
10.0000 mL | INTRAVENOUS | Status: DC | PRN
  Administered 2024-01-11: 10 mL

## 2024-01-11 MED ORDER — PACLITAXEL PROTEIN-BOUND CHEMO INJECTION 100 MG
100.0000 mg/m2 | Freq: Once | INTRAVENOUS | Status: AC
  Administered 2024-01-11: 200 mg via INTRAVENOUS
  Filled 2024-01-11: qty 40

## 2024-01-11 NOTE — Progress Notes (Signed)
 OK to treat today with low WBC/ANC per Dr Mosetta Putt.

## 2024-01-11 NOTE — Patient Instructions (Signed)
 [facility] - A DEPT OF [facility]  Discharge Instructions: Thank you for choosing [facility] to provide your oncology and hematology care.   If you have a lab appointment with the Cancer Center, please go directly to the Cancer Center and check in at the registration area.   Wear comfortable clothing and clothing appropriate for easy access to any Portacath or PICC line.   We strive to give you quality time with your provider. You may need to reschedule your appointment if you arrive late (15 or more minutes).  Arriving late affects you and other patients whose appointments are after yours.  Also, if you miss three or more appointments without notifying the office, you may be dismissed from the clinic at the provider's discretion.      For prescription refill requests, have your pharmacy contact our office and allow 72 hours for refills to be completed.    Today you received the following chemotherapy and/or immunotherapy agents :  Protein  bound paclitaxel & Gemcitabine      To help prevent nausea and vomiting after your treatment, we encourage you to take your nausea medication as directed.  BELOW ARE SYMPTOMS THAT SHOULD BE REPORTED IMMEDIATELY: *FEVER GREATER THAN 100.4 F (38 C) OR HIGHER *CHILLS OR SWEATING *NAUSEA AND VOMITING THAT IS NOT CONTROLLED WITH YOUR NAUSEA MEDICATION *UNUSUAL SHORTNESS OF BREATH *UNUSUAL BRUISING OR BLEEDING *URINARY PROBLEMS (pain or burning when urinating, or frequent urination) *BOWEL PROBLEMS (unusual diarrhea, constipation, pain near the anus) TENDERNESS IN MOUTH AND THROAT WITH OR WITHOUT PRESENCE OF ULCERS (sore throat, sores in mouth, or a toothache) UNUSUAL RASH, SWELLING OR PAIN  UNUSUAL VAGINAL DISCHARGE OR ITCHING   Items with * indicate a potential emergency and should be followed up as soon as possible or go to the Emergency Department if any problems should occur.  Please show the  CHEMOTHERAPY ALERT CARD or IMMUNOTHERAPY ALERT CARD at check-in to the Emergency Department and triage nurse.  Should you have questions after your visit or need to cancel or reschedule your appointment, please contact [facility] - A DEPT OF [facility]  Dept: [phone]  and follow the prompts.  Office hours are 8:00 a.m. to 4:30 p.m. Monday - Friday. Please note that voicemails left after 4:00 p.m. may not be returned until the following business day.  We are closed weekends and major holidays. You have access to a nurse at all times for urgent questions. Please call the main number to the clinic Dept: [phone] and follow the prompts.   For any non-urgent questions, you may also contact your provider using MyChart. We now offer e-Visits for anyone 51 and older to request care online for non-urgent symptoms. For details visit [email or web address].   Also download the MyChart app! Go to the app store, search "MyChart", open the app, select [place], and log in with your MyChart username and password.

## 2024-01-24 NOTE — Assessment & Plan Note (Signed)
 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 [name] showed 1.5 cm in pancreatic head, cytology confirmed adenocarcinoma.  An uncovered metal stent was placed in the common bile duct by Dr. Ewing Schlein -he completed 3 months neoadjuvant FOLFIRINOX on 06/08/22 - 09/09/22, he tolerated well. Chemo changed to FOLFIRI with liposomal irinotecan afterward  -restaging CT AP on 09/02/22 showed similar small amount of abnormal soft tissue adjacent to common bile duct stent (which is appropriately located), otherwise no new lesions or definitive signs of metastatic disease.  -We reviewed his case in GI tumor conference, unfortunately due to the tumor invasion of hepatic artery, our surgeons feel his pancreatic cancer is not resectable.  -His recent restaging CT scan at [facility] showed persistent tumor invasion of hepatic artery, Dr. [name] recommend SBRT, he completed at [facility]  -He was recently hospitalized for hyperbilirubinemia due to stent occlusion, and underwent ERCP again and stent exchange.  MRCP was done in the hospital, which showed no discrete pancreatic mass or evidence of metastasis. -Underwent Whipple with reconstruction of portal vein with saphenous graft 04/14/23 at [facility]  -Unfortunately he developed a local recurrence in December 2024 -He started chemo gemcitabine and Abraxne on 12/12/2023

## 2024-01-25 ENCOUNTER — Inpatient Hospital Stay (HOSPITAL_BASED_OUTPATIENT_CLINIC_OR_DEPARTMENT_OTHER): Payer: Medicare Other | Admitting: Hematology

## 2024-01-25 ENCOUNTER — Inpatient Hospital Stay: Payer: Medicare Other | Attending: Hematology

## 2024-01-25 ENCOUNTER — Inpatient Hospital Stay: Payer: Medicare Other

## 2024-01-25 VITALS — BP 140/68 | HR 79 | Temp 97.6°F | Resp 22 | Ht 68.0 in | Wt 204.1 lb

## 2024-01-25 DIAGNOSIS — D649 Anemia, unspecified: Secondary | ICD-10-CM

## 2024-01-25 DIAGNOSIS — D6481 Anemia due to antineoplastic chemotherapy: Secondary | ICD-10-CM | POA: Insufficient documentation

## 2024-01-25 DIAGNOSIS — C259 Malignant neoplasm of pancreas, unspecified: Secondary | ICD-10-CM

## 2024-01-25 DIAGNOSIS — Z5111 Encounter for antineoplastic chemotherapy: Secondary | ICD-10-CM | POA: Diagnosis not present

## 2024-01-25 DIAGNOSIS — C250 Malignant neoplasm of head of pancreas: Secondary | ICD-10-CM | POA: Insufficient documentation

## 2024-01-25 DIAGNOSIS — Z95828 Presence of other vascular implants and grafts: Secondary | ICD-10-CM

## 2024-01-25 LAB — CMP (CANCER CENTER ONLY)
ALT: 18 U/L (ref 0–44)
AST: 21 U/L (ref 15–41)
Albumin: 3.4 g/dL — ABNORMAL LOW (ref 3.5–5.0)
Alkaline Phosphatase: 106 U/L (ref 38–126)
Anion gap: 3 — ABNORMAL LOW (ref 5–15)
BUN: 15 mg/dL (ref 8–23)
CO2: 28 mmol/L (ref 22–32)
Calcium: 8.1 mg/dL — ABNORMAL LOW (ref 8.9–10.3)
Chloride: 109 mmol/L (ref 98–111)
Creatinine: 1.07 mg/dL (ref 0.61–1.24)
GFR, Estimated: >60 mL/min (ref >60)
Glucose, Bld: 127 mg/dL — ABNORMAL HIGH (ref 70–99)
Potassium: 4.2 mmol/L (ref 3.5–5.1)
Sodium: 140 mmol/L (ref 135–145)
Total Bilirubin: 0.4 mg/dL (ref 0.0–1.2)
Total Protein: 5.6 g/dL — ABNORMAL LOW (ref 6.5–8.1)

## 2024-01-25 LAB — CBC WITH DIFFERENTIAL (CANCER CENTER ONLY)
Abs Immature Granulocytes: 0.06 10*3/uL (ref 0.00–0.07)
Basophils Absolute: 0 10*3/uL (ref 0.0–0.1)
Basophils Relative: 1 %
Eosinophils Absolute: 0.3 10*3/uL (ref 0.0–0.5)
Eosinophils Relative: 6 %
HCT: 26.7 % — ABNORMAL LOW (ref 39.0–52.0)
Hemoglobin: 8.9 g/dL — ABNORMAL LOW (ref 13.0–17.0)
Immature Granulocytes: 2 %
Lymphocytes Relative: 17 %
Lymphs Abs: 0.7 10*3/uL (ref 0.7–4.0)
MCH: 30.6 pg (ref 26.0–34.0)
MCHC: 33.3 g/dL (ref 30.0–36.0)
MCV: 91.8 fL (ref 80.0–100.0)
Monocytes Absolute: 0.7 10*3/uL (ref 0.1–1.0)
Monocytes Relative: 17 %
Neutro Abs: 2.4 10*3/uL (ref 1.7–7.7)
Neutrophils Relative %: 57 %
Platelet Count: 182 10*3/uL (ref 150–400)
RBC: 2.91 MIL/uL — ABNORMAL LOW (ref 4.22–5.81)
RDW: 16 % — ABNORMAL HIGH (ref 11.5–15.5)
WBC Count: 4.1 10*3/uL (ref 4.0–10.5)
nRBC: 0 % (ref 0.0–0.2)

## 2024-01-25 MED ORDER — OXYCODONE HCL 5 MG PO TABS: 5.0000 mg | ORAL_TABLET | Freq: Three times a day (TID) | ORAL | 0 refills | Status: DC | PRN

## 2024-01-25 MED ORDER — SODIUM CHLORIDE 0.9% FLUSH
10.0000 mL | INTRAVENOUS | Status: DC | PRN
  Administered 2024-01-25: 10 mL

## 2024-01-25 MED ORDER — SODIUM CHLORIDE 0.9% FLUSH
10.0000 mL | Freq: Once | INTRAVENOUS | Status: AC
Start: 2024-01-25 — End: 2024-01-25
  Administered 2024-01-25: 10 mL

## 2024-01-25 MED ORDER — PACLITAXEL PROTEIN-BOUND CHEMO INJECTION 100 MG
100.0000 mg/m2 | Freq: Once | INTRAVENOUS | Status: AC
  Administered 2024-01-25: 200 mg via INTRAVENOUS
  Filled 2024-01-25: qty 40

## 2024-01-25 MED ORDER — SODIUM CHLORIDE 0.9 % IV SOLN
1000.0000 mg/m2 | Freq: Once | INTRAVENOUS | Status: AC
  Administered 2024-01-25: 2014 mg via INTRAVENOUS
  Filled 2024-01-25: qty 52.61

## 2024-01-25 MED ORDER — HEPARIN SOD (PORK) LOCK FLUSH 100 UNIT/ML IV SOLN
500.0000 [IU] | Freq: Once | INTRAVENOUS | Status: AC | PRN
  Administered 2024-01-25: 500 [IU]

## 2024-01-25 MED ORDER — PROCHLORPERAZINE MALEATE 10 MG PO TABS
10.0000 mg | ORAL_TABLET | Freq: Once | ORAL | Status: AC
  Administered 2024-01-25: 10 mg via ORAL
  Filled 2024-01-25: qty 1

## 2024-01-25 MED ORDER — SODIUM CHLORIDE 0.9 % IV SOLN: INTRAVENOUS | Status: DC
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
## 2024-01-25 NOTE — Patient Instructions (Signed)
 [facility] - A DEPT OF [facility]  Discharge Instructions: Thank you for choosing [facility] to provide your oncology and hematology care.   If you have a lab appointment with the Cancer Center, please go directly to the Cancer Center and check in at the registration area.   Wear comfortable clothing and clothing appropriate for easy access to any Portacath or PICC line.   We strive to give you quality time with your provider. You may need to reschedule your appointment if you arrive late (15 or more minutes).  Arriving late affects you and other patients whose appointments are after yours.  Also, if you miss three or more appointments without notifying the office, you may be dismissed from the clinic at the provider's discretion.      For prescription refill requests, have your pharmacy contact our office and allow 72 hours for refills to be completed.    Today you received the following chemotherapy and/or immunotherapy agents: Abraxane, Gemzar      To help prevent nausea and vomiting after your treatment, we encourage you to take your nausea medication as directed.  BELOW ARE SYMPTOMS THAT SHOULD BE REPORTED IMMEDIATELY: *FEVER GREATER THAN 100.4 F (38 C) OR HIGHER *CHILLS OR SWEATING *NAUSEA AND VOMITING THAT IS NOT CONTROLLED WITH YOUR NAUSEA MEDICATION *UNUSUAL SHORTNESS OF BREATH *UNUSUAL BRUISING OR BLEEDING *URINARY PROBLEMS (pain or burning when urinating, or frequent urination) *BOWEL PROBLEMS (unusual diarrhea, constipation, pain near the anus) TENDERNESS IN MOUTH AND THROAT WITH OR WITHOUT PRESENCE OF ULCERS (sore throat, sores in mouth, or a toothache) UNUSUAL RASH, SWELLING OR PAIN  UNUSUAL VAGINAL DISCHARGE OR ITCHING   Items with * indicate a potential emergency and should be followed up as soon as possible or go to the Emergency Department if any problems should occur.  Please show the CHEMOTHERAPY ALERT CARD or  IMMUNOTHERAPY ALERT CARD at check-in to the Emergency Department and triage nurse.  Should you have questions after your visit or need to cancel or reschedule your appointment, please contact [facility] - A DEPT OF [facility]  Dept: [phone]  and follow the prompts.  Office hours are 8:00 a.m. to 4:30 p.m. Monday - Friday. Please note that voicemails left after 4:00 p.m. may not be returned until the following business day.  We are closed weekends and major holidays. You have access to a nurse at all times for urgent questions. Please call the main number to the clinic Dept: [phone] and follow the prompts.   For any non-urgent questions, you may also contact your provider using MyChart. We now offer e-Visits for anyone 73 and older to request care online for non-urgent symptoms. For details visit [email or web address].   Also download the MyChart app! Go to the app store, search "MyChart", open the app, select [place], and log in with your MyChart username and password.

## 2024-01-27 ENCOUNTER — Other Ambulatory Visit: Payer: Self-pay

## 2024-02-01 ENCOUNTER — Inpatient Hospital Stay: Payer: Medicare Other

## 2024-02-01 ENCOUNTER — Other Ambulatory Visit: Payer: Self-pay | Admitting: Physician Assistant

## 2024-02-01 ENCOUNTER — Encounter: Payer: Self-pay | Admitting: Hematology

## 2024-02-01 DIAGNOSIS — Z95828 Presence of other vascular implants and grafts: Secondary | ICD-10-CM

## 2024-02-01 DIAGNOSIS — C259 Malignant neoplasm of pancreas, unspecified: Secondary | ICD-10-CM

## 2024-02-01 DIAGNOSIS — C250 Malignant neoplasm of head of pancreas: Secondary | ICD-10-CM | POA: Diagnosis not present

## 2024-02-01 DIAGNOSIS — D649 Anemia, unspecified: Secondary | ICD-10-CM

## 2024-02-01 DIAGNOSIS — D6481 Anemia due to antineoplastic chemotherapy: Secondary | ICD-10-CM | POA: Diagnosis not present

## 2024-02-01 DIAGNOSIS — Z5111 Encounter for antineoplastic chemotherapy: Secondary | ICD-10-CM | POA: Diagnosis not present

## 2024-02-01 LAB — SAMPLE TO BLOOD BANK

## 2024-02-01 LAB — CBC WITH DIFFERENTIAL (CANCER CENTER ONLY)
Abs Immature Granulocytes: 0.1 10*3/uL — ABNORMAL HIGH (ref 0.00–0.07)
Basophils Absolute: 0 10*3/uL (ref 0.0–0.1)
Basophils Relative: 1 %
Eosinophils Absolute: 0 10*3/uL (ref 0.0–0.5)
Eosinophils Relative: 3 %
HCT: 24.6 % — ABNORMAL LOW (ref 39.0–52.0)
Hemoglobin: 8.1 g/dL — ABNORMAL LOW (ref 13.0–17.0)
Immature Granulocytes: 7 %
Lymphocytes Relative: 31 %
Lymphs Abs: 0.5 10*3/uL — ABNORMAL LOW (ref 0.7–4.0)
MCH: 29.8 pg (ref 26.0–34.0)
MCHC: 32.9 g/dL (ref 30.0–36.0)
MCV: 90.4 fL (ref 80.0–100.0)
Monocytes Absolute: 0.3 10*3/uL (ref 0.1–1.0)
Monocytes Relative: 19 %
Neutro Abs: 0.6 10*3/uL — ABNORMAL LOW (ref 1.7–7.7)
Neutrophils Relative %: 39 %
Platelet Count: 183 10*3/uL (ref 150–400)
RBC: 2.72 MIL/uL — ABNORMAL LOW (ref 4.22–5.81)
RDW: 15.4 % (ref 11.5–15.5)
Smear Review: NORMAL
WBC Count: 1.5 10*3/uL — ABNORMAL LOW (ref 4.0–10.5)
nRBC: 1.3 % — ABNORMAL HIGH (ref 0.0–0.2)

## 2024-02-01 LAB — CMP (CANCER CENTER ONLY)
ALT: 31 U/L (ref 0–44)
AST: 35 U/L (ref 15–41)
Albumin: 3.3 g/dL — ABNORMAL LOW (ref 3.5–5.0)
Alkaline Phosphatase: 118 U/L (ref 38–126)
Anion gap: 4 — ABNORMAL LOW (ref 5–15)
BUN: 12 mg/dL (ref 8–23)
CO2: 27 mmol/L (ref 22–32)
Calcium: 8.3 mg/dL — ABNORMAL LOW (ref 8.9–10.3)
Chloride: 111 mmol/L (ref 98–111)
Creatinine: 0.92 mg/dL (ref 0.61–1.24)
GFR, Estimated: >60 mL/min (ref >60)
Glucose, Bld: 101 mg/dL — ABNORMAL HIGH (ref 70–99)
Potassium: 4.3 mmol/L (ref 3.5–5.1)
Sodium: 142 mmol/L (ref 135–145)
Total Bilirubin: 0.3 mg/dL (ref 0.0–1.2)
Total Protein: 5.8 g/dL — ABNORMAL LOW (ref 6.5–8.1)

## 2024-02-01 LAB — IRON AND IRON BINDING CAPACITY (CC-WL,HP ONLY)
Iron: 31 ug/dL — ABNORMAL LOW (ref 45–182)
Saturation Ratios: 12 % — ABNORMAL LOW (ref 17.9–39.5)
TIBC: 262 ug/dL (ref 250–450)
UIBC: 231 ug/dL

## 2024-02-01 LAB — FOLATE: Folate: 19.2 ng/mL (ref >5.9)

## 2024-02-01 LAB — VITAMIN B12: Vitamin B-12: 794 pg/mL (ref 180–914)

## 2024-02-01 MED ORDER — SODIUM CHLORIDE 0.9% FLUSH
10.0000 mL | Freq: Once | INTRAVENOUS | Status: AC
  Administered 2024-02-01: 10 mL

## 2024-02-01 NOTE — Progress Notes (Signed)
 Deferred treatment by one week due to neutropenia with progressive anemia. Will obtain nutritional studies to rule out other causes. Consider GCSF injection to prevent future neutropenia. Neutropenic precautions given to patient.

## 2024-02-02 LAB — FERRITIN: Ferritin: 310 ng/mL (ref 24–336)

## 2024-02-03 ENCOUNTER — Other Ambulatory Visit: Payer: Self-pay

## 2024-02-05 LAB — METHYLMALONIC ACID, SERUM: Methylmalonic Acid, Quantitative: 120 nmol/L (ref 0–378)

## 2024-02-08 ENCOUNTER — Encounter: Payer: Self-pay | Admitting: Hematology

## 2024-02-08 ENCOUNTER — Inpatient Hospital Stay: Admitting: Hematology

## 2024-02-08 ENCOUNTER — Other Ambulatory Visit: Payer: Self-pay

## 2024-02-08 ENCOUNTER — Inpatient Hospital Stay

## 2024-02-08 VITALS — BP 137/69 | HR 69 | Temp 98.0°F | Resp 20 | Wt 203.3 lb

## 2024-02-08 DIAGNOSIS — C259 Malignant neoplasm of pancreas, unspecified: Secondary | ICD-10-CM

## 2024-02-08 DIAGNOSIS — D6481 Anemia due to antineoplastic chemotherapy: Secondary | ICD-10-CM | POA: Diagnosis not present

## 2024-02-08 DIAGNOSIS — C250 Malignant neoplasm of head of pancreas: Secondary | ICD-10-CM | POA: Diagnosis not present

## 2024-02-08 DIAGNOSIS — Z5111 Encounter for antineoplastic chemotherapy: Secondary | ICD-10-CM | POA: Diagnosis not present

## 2024-02-08 DIAGNOSIS — Z95828 Presence of other vascular implants and grafts: Secondary | ICD-10-CM

## 2024-02-08 LAB — CMP (CANCER CENTER ONLY)
ALT: 19 U/L (ref 0–44)
AST: 29 U/L (ref 15–41)
Albumin: 3.6 g/dL (ref 3.5–5.0)
Alkaline Phosphatase: 123 U/L (ref 38–126)
Anion gap: 6 (ref 5–15)
BUN: 12 mg/dL (ref 8–23)
CO2: 27 mmol/L (ref 22–32)
Calcium: 8.6 mg/dL — ABNORMAL LOW (ref 8.9–10.3)
Chloride: 109 mmol/L (ref 98–111)
Creatinine: 1.15 mg/dL (ref 0.61–1.24)
GFR, Estimated: >60 mL/min (ref >60)
Glucose, Bld: 121 mg/dL — ABNORMAL HIGH (ref 70–99)
Potassium: 4 mmol/L (ref 3.5–5.1)
Sodium: 142 mmol/L (ref 135–145)
Total Bilirubin: 0.5 mg/dL (ref 0.0–1.2)
Total Protein: 6.2 g/dL — ABNORMAL LOW (ref 6.5–8.1)

## 2024-02-08 LAB — CBC WITH DIFFERENTIAL (CANCER CENTER ONLY)
Abs Immature Granulocytes: 0.02 10*3/uL (ref 0.00–0.07)
Basophils Absolute: 0 10*3/uL (ref 0.0–0.1)
Basophils Relative: 1 %
Eosinophils Absolute: 0.2 10*3/uL (ref 0.0–0.5)
Eosinophils Relative: 5 %
HCT: 28.5 % — ABNORMAL LOW (ref 39.0–52.0)
Hemoglobin: 9.3 g/dL — ABNORMAL LOW (ref 13.0–17.0)
Immature Granulocytes: 1 %
Lymphocytes Relative: 18 %
Lymphs Abs: 0.6 10*3/uL — ABNORMAL LOW (ref 0.7–4.0)
MCH: 29.9 pg (ref 26.0–34.0)
MCHC: 32.6 g/dL (ref 30.0–36.0)
MCV: 91.6 fL (ref 80.0–100.0)
Monocytes Absolute: 0.6 10*3/uL (ref 0.1–1.0)
Monocytes Relative: 16 %
Neutro Abs: 2.1 10*3/uL (ref 1.7–7.7)
Neutrophils Relative %: 59 %
Platelet Count: 142 10*3/uL — ABNORMAL LOW (ref 150–400)
RBC: 3.11 MIL/uL — ABNORMAL LOW (ref 4.22–5.81)
RDW: 16.7 % — ABNORMAL HIGH (ref 11.5–15.5)
WBC Count: 3.5 10*3/uL — ABNORMAL LOW (ref 4.0–10.5)
nRBC: 0 % (ref 0.0–0.2)

## 2024-02-08 MED ORDER — PROCHLORPERAZINE MALEATE 10 MG PO TABS
10.0000 mg | ORAL_TABLET | Freq: Once | ORAL | Status: AC
Start: 2024-02-08 — End: 2024-02-08
  Administered 2024-02-08: 10 mg via ORAL
  Filled 2024-02-08: qty 1

## 2024-02-08 MED ORDER — HEPARIN SOD (PORK) LOCK FLUSH 100 UNIT/ML IV SOLN
500.0000 [IU] | Freq: Once | INTRAVENOUS | Status: AC | PRN
  Administered 2024-02-08: 500 [IU]

## 2024-02-08 MED ORDER — SODIUM CHLORIDE 0.9 % IV SOLN
INTRAVENOUS | Status: DC
Start: 2024-02-08 — End: 2024-02-08

## 2024-02-08 MED ORDER — SODIUM CHLORIDE 0.9 % IV SOLN
1000.0000 mg/m2 | Freq: Once | INTRAVENOUS | Status: AC
  Administered 2024-02-08: 2014 mg via INTRAVENOUS
  Filled 2024-02-08: qty 52.61

## 2024-02-08 MED ORDER — SODIUM CHLORIDE 0.9% FLUSH
10.0000 mL | Freq: Once | INTRAVENOUS | Status: AC
  Administered 2024-02-08: 10 mL

## 2024-02-08 MED ORDER — PACLITAXEL PROTEIN-BOUND CHEMO INJECTION 100 MG
100.0000 mg/m2 | Freq: Once | INTRAVENOUS | Status: AC
  Administered 2024-02-08: 200 mg via INTRAVENOUS
  Filled 2024-02-08: qty 40

## 2024-02-08 MED ORDER — SODIUM CHLORIDE 0.9% FLUSH
10.0000 mL | INTRAVENOUS | Status: DC | PRN
  Administered 2024-02-08: 10 mL

## 2024-02-08 MED ORDER — SODIUM CHLORIDE 0.9 % IV SOLN: INTRAVENOUS | Status: DC

## 2024-02-08 NOTE — Assessment & Plan Note (Signed)
 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 [name] showed 1.5 cm in pancreatic head, cytology confirmed adenocarcinoma.  An uncovered metal stent was placed in the common bile duct by Dr. Ewing Schlein -he completed 3 months neoadjuvant FOLFIRINOX on 06/08/22 - 09/09/22, he tolerated well. Chemo changed to FOLFIRI with liposomal irinotecan afterward  -restaging CT AP on 09/02/22 showed similar small amount of abnormal soft tissue adjacent to common bile duct stent (which is appropriately located), otherwise no new lesions or definitive signs of metastatic disease.  -We reviewed his case in GI tumor conference, unfortunately due to the tumor invasion of hepatic artery, our surgeons feel his pancreatic cancer is not resectable.  -His recent restaging CT scan at [facility] showed persistent tumor invasion of hepatic artery, Dr. [name] recommend SBRT, he completed at [facility]  -He was recently hospitalized for hyperbilirubinemia due to stent occlusion, and underwent ERCP again and stent exchange.  MRCP was done in the hospital, which showed no discrete pancreatic mass or evidence of metastasis. -Underwent Whipple with reconstruction of portal vein with saphenous graft 04/14/23 at [facility]  -Unfortunately he developed a local recurrence in December 2024 -He started chemo gemcitabine and Abraxne on 12/12/2023
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
## 2024-02-08 NOTE — Patient Instructions (Signed)
 [facility] - A DEPT OF [facility]  Discharge Instructions: Thank you for choosing [facility] to provide your oncology and hematology care.   If you have a lab appointment with the Cancer Center, please go directly to the Cancer Center and check in at the registration area.   Wear comfortable clothing and clothing appropriate for easy access to any Portacath or PICC line.   We strive to give you quality time with your provider. You may need to reschedule your appointment if you arrive late (15 or more minutes).  Arriving late affects you and other patients whose appointments are after yours.  Also, if you miss three or more appointments without notifying the office, you may be dismissed from the clinic at the provider's discretion.      For prescription refill requests, have your pharmacy contact our office and allow 72 hours for refills to be completed.    Today you received the following chemotherapy and/or immunotherapy agents: Abraxane, Gemzar      To help prevent nausea and vomiting after your treatment, we encourage you to take your nausea medication as directed.  BELOW ARE SYMPTOMS THAT SHOULD BE REPORTED IMMEDIATELY: *FEVER GREATER THAN 100.4 F (38 C) OR HIGHER *CHILLS OR SWEATING *NAUSEA AND VOMITING THAT IS NOT CONTROLLED WITH YOUR NAUSEA MEDICATION *UNUSUAL SHORTNESS OF BREATH *UNUSUAL BRUISING OR BLEEDING *URINARY PROBLEMS (pain or burning when urinating, or frequent urination) *BOWEL PROBLEMS (unusual diarrhea, constipation, pain near the anus) TENDERNESS IN MOUTH AND THROAT WITH OR WITHOUT PRESENCE OF ULCERS (sore throat, sores in mouth, or a toothache) UNUSUAL RASH, SWELLING OR PAIN  UNUSUAL VAGINAL DISCHARGE OR ITCHING   Items with * indicate a potential emergency and should be followed up as soon as possible or go to the Emergency Department if any problems should occur.  Please show the CHEMOTHERAPY ALERT CARD or  IMMUNOTHERAPY ALERT CARD at check-in to the Emergency Department and triage nurse.  Should you have questions after your visit or need to cancel or reschedule your appointment, please contact [facility] - A DEPT OF [facility]  Dept: [phone]  and follow the prompts.  Office hours are 8:00 a.m. to 4:30 p.m. Monday - Friday. Please note that voicemails left after 4:00 p.m. may not be returned until the following business day.  We are closed weekends and major holidays. You have access to a nurse at all times for urgent questions. Please call the main number to the clinic Dept: [phone] and follow the prompts.   For any non-urgent questions, you may also contact your provider using MyChart. We now offer e-Visits for anyone 73 and older to request care online for non-urgent symptoms. For details visit [email or web address].   Also download the MyChart app! Go to the app store, search "MyChart", open the app, select [place], and log in with your MyChart username and password.

## 2024-02-15 ENCOUNTER — Ambulatory Visit: Payer: Medicare Other

## 2024-02-15 ENCOUNTER — Other Ambulatory Visit: Payer: Medicare Other

## 2024-02-15 ENCOUNTER — Ambulatory Visit: Payer: Medicare Other | Admitting: Nurse Practitioner

## 2024-02-20 ENCOUNTER — Other Ambulatory Visit: Payer: Medicare Other

## 2024-02-20 ENCOUNTER — Ambulatory Visit: Payer: Medicare Other

## 2024-02-22 ENCOUNTER — Inpatient Hospital Stay

## 2024-02-22 ENCOUNTER — Encounter: Payer: Self-pay | Admitting: Hematology

## 2024-02-22 ENCOUNTER — Inpatient Hospital Stay: Attending: Hematology

## 2024-02-22 ENCOUNTER — Inpatient Hospital Stay: Admitting: Hematology

## 2024-02-22 VITALS — BP 139/65 | HR 63 | Temp 97.7°F | Resp 18

## 2024-02-22 VITALS — BP 150/70 | HR 67 | Temp 97.6°F | Resp 18 | Ht 68.0 in | Wt 198.6 lb

## 2024-02-22 DIAGNOSIS — C7989 Secondary malignant neoplasm of other specified sites: Secondary | ICD-10-CM | POA: Diagnosis not present

## 2024-02-22 DIAGNOSIS — Z5111 Encounter for antineoplastic chemotherapy: Secondary | ICD-10-CM | POA: Insufficient documentation

## 2024-02-22 DIAGNOSIS — C259 Malignant neoplasm of pancreas, unspecified: Secondary | ICD-10-CM | POA: Diagnosis not present

## 2024-02-22 DIAGNOSIS — C250 Malignant neoplasm of head of pancreas: Secondary | ICD-10-CM | POA: Diagnosis not present

## 2024-02-22 DIAGNOSIS — Z79633 Long term (current) use of mitotic inhibitor: Secondary | ICD-10-CM | POA: Diagnosis not present

## 2024-02-22 DIAGNOSIS — Z79631 Long term (current) use of antimetabolite agent: Secondary | ICD-10-CM | POA: Diagnosis not present

## 2024-02-22 DIAGNOSIS — Z95828 Presence of other vascular implants and grafts: Secondary | ICD-10-CM

## 2024-02-22 DIAGNOSIS — D6481 Anemia due to antineoplastic chemotherapy: Secondary | ICD-10-CM | POA: Diagnosis not present

## 2024-02-22 LAB — CBC WITH DIFFERENTIAL (CANCER CENTER ONLY)
Abs Immature Granulocytes: 0.06 10*3/uL (ref 0.00–0.07)
Basophils Absolute: 0 10*3/uL (ref 0.0–0.1)
Basophils Relative: 1 %
Eosinophils Absolute: 0.2 10*3/uL (ref 0.0–0.5)
Eosinophils Relative: 5 %
HCT: 27.5 % — ABNORMAL LOW (ref 39.0–52.0)
Hemoglobin: 9 g/dL — ABNORMAL LOW (ref 13.0–17.0)
Immature Granulocytes: 2 %
Lymphocytes Relative: 16 %
Lymphs Abs: 0.5 10*3/uL — ABNORMAL LOW (ref 0.7–4.0)
MCH: 30 pg (ref 26.0–34.0)
MCHC: 32.7 g/dL (ref 30.0–36.0)
MCV: 91.7 fL (ref 80.0–100.0)
Monocytes Absolute: 0.6 10*3/uL (ref 0.1–1.0)
Monocytes Relative: 18 %
Neutro Abs: 1.9 10*3/uL (ref 1.7–7.7)
Neutrophils Relative %: 58 %
Platelet Count: 151 10*3/uL (ref 150–400)
RBC: 3 MIL/uL — ABNORMAL LOW (ref 4.22–5.81)
RDW: 16.3 % — ABNORMAL HIGH (ref 11.5–15.5)
WBC Count: 3.3 10*3/uL — ABNORMAL LOW (ref 4.0–10.5)
nRBC: 0 % (ref 0.0–0.2)

## 2024-02-22 LAB — CMP (CANCER CENTER ONLY)
ALT: 17 U/L (ref 0–44)
AST: 29 U/L (ref 15–41)
Albumin: 3.5 g/dL (ref 3.5–5.0)
Alkaline Phosphatase: 99 U/L (ref 38–126)
Anion gap: 4 — ABNORMAL LOW (ref 5–15)
BUN: 11 mg/dL (ref 8–23)
CO2: 27 mmol/L (ref 22–32)
Calcium: 8.6 mg/dL — ABNORMAL LOW (ref 8.9–10.3)
Chloride: 109 mmol/L (ref 98–111)
Creatinine: 0.98 mg/dL (ref 0.61–1.24)
GFR, Estimated: >60 mL/min (ref >60)
Glucose, Bld: 94 mg/dL (ref 70–99)
Potassium: 4.4 mmol/L (ref 3.5–5.1)
Sodium: 140 mmol/L (ref 135–145)
Total Bilirubin: 0.5 mg/dL (ref 0.0–1.2)
Total Protein: 6 g/dL — ABNORMAL LOW (ref 6.5–8.1)

## 2024-02-22 MED ORDER — SODIUM CHLORIDE 0.9% FLUSH
10.0000 mL | Freq: Once | INTRAVENOUS | Status: AC
Start: 2024-02-22 — End: 2024-02-22
  Administered 2024-02-22: 10 mL

## 2024-02-22 MED ORDER — SODIUM CHLORIDE 0.9 % IV SOLN
1000.0000 mg/m2 | Freq: Once | INTRAVENOUS | Status: AC
  Administered 2024-02-22: 2014 mg via INTRAVENOUS
  Filled 2024-02-22: qty 52.97

## 2024-02-22 MED ORDER — PROCHLORPERAZINE MALEATE 10 MG PO TABS
10.0000 mg | ORAL_TABLET | Freq: Once | ORAL | Status: AC
Start: 2024-02-22 — End: 2024-02-22
  Administered 2024-02-22: 10 mg via ORAL
  Filled 2024-02-22: qty 1

## 2024-02-22 MED ORDER — HEPARIN SOD (PORK) LOCK FLUSH 100 UNIT/ML IV SOLN
500.0000 [IU] | Freq: Once | INTRAVENOUS | Status: AC | PRN
Start: 2024-02-22 — End: 2024-02-22
  Administered 2024-02-22: 500 [IU]

## 2024-02-22 MED ORDER — PACLITAXEL PROTEIN-BOUND CHEMO INJECTION 100 MG
100.0000 mg/m2 | Freq: Once | INTRAVENOUS | Status: AC
  Administered 2024-02-22: 200 mg via INTRAVENOUS
  Filled 2024-02-22: qty 40

## 2024-02-22 MED ORDER — SODIUM CHLORIDE 0.9% FLUSH
10.0000 mL | INTRAVENOUS | Status: DC | PRN
  Administered 2024-02-22: 10 mL

## 2024-02-22 MED ORDER — SODIUM CHLORIDE 0.9 % IV SOLN: INTRAVENOUS | Status: DC

## 2024-02-22 NOTE — Assessment & Plan Note (Signed)
 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 [name] showed 1.5 cm in pancreatic head, cytology confirmed adenocarcinoma.  An uncovered metal stent was placed in the common bile duct by Dr. Ewing Schlein -he completed 3 months neoadjuvant FOLFIRINOX on 06/08/22 - 09/09/22, he tolerated well. Chemo changed to FOLFIRI with liposomal irinotecan afterward  -restaging CT AP on 09/02/22 showed similar small amount of abnormal soft tissue adjacent to common bile duct stent (which is appropriately located), otherwise no new lesions or definitive signs of metastatic disease.  -We reviewed his case in GI tumor conference, unfortunately due to the tumor invasion of hepatic artery, our surgeons feel his pancreatic cancer is not resectable.  -His recent restaging CT scan at [facility] showed persistent tumor invasion of hepatic artery, Dr. [name] recommend SBRT, he completed at [facility]  -He was recently hospitalized for hyperbilirubinemia due to stent occlusion, and underwent ERCP again and stent exchange.  MRCP was done in the hospital, which showed no discrete pancreatic mass or evidence of metastasis. -Underwent Whipple with reconstruction of portal vein with saphenous graft 04/14/23 at [facility]  -Unfortunately he developed a local recurrence in December 2024 -He started chemo gemcitabine and Abraxne on 12/12/2023

## 2024-02-22 NOTE — Patient Instructions (Signed)
 [facility] - A DEPT OF [facility]  Discharge Instructions: Thank you for choosing [facility] to provide your oncology and hematology care.   If you have a lab appointment with the Cancer Center, please go directly to the Cancer Center and check in at the registration area.   Wear comfortable clothing and clothing appropriate for easy access to any Portacath or PICC line.   We strive to give you quality time with your provider. You may need to reschedule your appointment if you arrive late (15 or more minutes).  Arriving late affects you and other patients whose appointments are after yours.  Also, if you miss three or more appointments without notifying the office, you may be dismissed from the clinic at the provider's discretion.      For prescription refill requests, have your pharmacy contact our office and allow 72 hours for refills to be completed.    Today you received the following chemotherapy and/or immunotherapy agents: Abraxane, Gemzar      To help prevent nausea and vomiting after your treatment, we encourage you to take your nausea medication as directed.  BELOW ARE SYMPTOMS THAT SHOULD BE REPORTED IMMEDIATELY: *FEVER GREATER THAN 100.4 F (38 C) OR HIGHER *CHILLS OR SWEATING *NAUSEA AND VOMITING THAT IS NOT CONTROLLED WITH YOUR NAUSEA MEDICATION *UNUSUAL SHORTNESS OF BREATH *UNUSUAL BRUISING OR BLEEDING *URINARY PROBLEMS (pain or burning when urinating, or frequent urination) *BOWEL PROBLEMS (unusual diarrhea, constipation, pain near the anus) TENDERNESS IN MOUTH AND THROAT WITH OR WITHOUT PRESENCE OF ULCERS (sore throat, sores in mouth, or a toothache) UNUSUAL RASH, SWELLING OR PAIN  UNUSUAL VAGINAL DISCHARGE OR ITCHING   Items with * indicate a potential emergency and should be followed up as soon as possible or go to the Emergency Department if any problems should occur.  Please show the CHEMOTHERAPY ALERT CARD or  IMMUNOTHERAPY ALERT CARD at check-in to the Emergency Department and triage nurse.  Should you have questions after your visit or need to cancel or reschedule your appointment, please contact [facility] - A DEPT OF [facility]  Dept: [phone]  and follow the prompts.  Office hours are 8:00 a.m. to 4:30 p.m. Monday - Friday. Please note that voicemails left after 4:00 p.m. may not be returned until the following business day.  We are closed weekends and major holidays. You have access to a nurse at all times for urgent questions. Please call the main number to the clinic Dept: [phone] and follow the prompts.   For any non-urgent questions, you may also contact your provider using MyChart. We now offer e-Visits for anyone 73 and older to request care online for non-urgent symptoms. For details visit [email or web address].   Also download the MyChart app! Go to the app store, search "MyChart", open the app, select [place], and log in with your MyChart username and password.
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
## 2024-02-24 ENCOUNTER — Inpatient Hospital Stay

## 2024-02-24 VITALS — BP 111/68 | HR 81 | Temp 98.4°F | Resp 17

## 2024-02-24 DIAGNOSIS — C250 Malignant neoplasm of head of pancreas: Secondary | ICD-10-CM | POA: Diagnosis not present

## 2024-02-24 DIAGNOSIS — Z79633 Long term (current) use of mitotic inhibitor: Secondary | ICD-10-CM | POA: Diagnosis not present

## 2024-02-24 DIAGNOSIS — Z79631 Long term (current) use of antimetabolite agent: Secondary | ICD-10-CM | POA: Diagnosis not present

## 2024-02-24 DIAGNOSIS — C7989 Secondary malignant neoplasm of other specified sites: Secondary | ICD-10-CM | POA: Diagnosis not present

## 2024-02-24 DIAGNOSIS — Z95828 Presence of other vascular implants and grafts: Secondary | ICD-10-CM

## 2024-02-24 DIAGNOSIS — Z5111 Encounter for antineoplastic chemotherapy: Secondary | ICD-10-CM | POA: Diagnosis not present

## 2024-02-24 DIAGNOSIS — D6481 Anemia due to antineoplastic chemotherapy: Secondary | ICD-10-CM | POA: Diagnosis not present

## 2024-02-24 MED ORDER — FILGRASTIM-SNDZ 480 MCG/0.8ML IJ SOSY
480.0000 ug | PREFILLED_SYRINGE | Freq: Once | INTRAMUSCULAR | Status: AC
  Administered 2024-02-24: 480 ug via SUBCUTANEOUS
  Filled 2024-02-24: qty 0.8

## 2024-02-29 ENCOUNTER — Inpatient Hospital Stay

## 2024-02-29 VITALS — BP 139/73 | HR 68 | Temp 97.6°F | Resp 16 | Wt 199.2 lb

## 2024-02-29 DIAGNOSIS — D6481 Anemia due to antineoplastic chemotherapy: Secondary | ICD-10-CM | POA: Diagnosis not present

## 2024-02-29 DIAGNOSIS — C259 Malignant neoplasm of pancreas, unspecified: Secondary | ICD-10-CM

## 2024-02-29 DIAGNOSIS — Z5111 Encounter for antineoplastic chemotherapy: Secondary | ICD-10-CM | POA: Diagnosis not present

## 2024-02-29 DIAGNOSIS — Z95828 Presence of other vascular implants and grafts: Secondary | ICD-10-CM

## 2024-02-29 DIAGNOSIS — C250 Malignant neoplasm of head of pancreas: Secondary | ICD-10-CM | POA: Diagnosis not present

## 2024-02-29 DIAGNOSIS — C7989 Secondary malignant neoplasm of other specified sites: Secondary | ICD-10-CM | POA: Diagnosis not present

## 2024-02-29 DIAGNOSIS — Z79633 Long term (current) use of mitotic inhibitor: Secondary | ICD-10-CM | POA: Diagnosis not present

## 2024-02-29 DIAGNOSIS — Z79631 Long term (current) use of antimetabolite agent: Secondary | ICD-10-CM | POA: Diagnosis not present

## 2024-02-29 LAB — CBC WITH DIFFERENTIAL (CANCER CENTER ONLY)
Abs Immature Granulocytes: 0.15 10*3/uL — ABNORMAL HIGH (ref 0.00–0.07)
Basophils Absolute: 0 10*3/uL (ref 0.0–0.1)
Basophils Relative: 1 %
Eosinophils Absolute: 0 10*3/uL (ref 0.0–0.5)
Eosinophils Relative: 1 %
HCT: 26.4 % — ABNORMAL LOW (ref 39.0–52.0)
Hemoglobin: 8.7 g/dL — ABNORMAL LOW (ref 13.0–17.0)
Immature Granulocytes: 5 %
Lymphocytes Relative: 22 %
Lymphs Abs: 0.7 10*3/uL (ref 0.7–4.0)
MCH: 30.1 pg (ref 26.0–34.0)
MCHC: 33 g/dL (ref 30.0–36.0)
MCV: 91.3 fL (ref 80.0–100.0)
Monocytes Absolute: 0.5 10*3/uL (ref 0.1–1.0)
Monocytes Relative: 14 %
Neutro Abs: 1.8 10*3/uL (ref 1.7–7.7)
Neutrophils Relative %: 57 %
Platelet Count: 168 10*3/uL (ref 150–400)
RBC: 2.89 MIL/uL — ABNORMAL LOW (ref 4.22–5.81)
RDW: 15.6 % — ABNORMAL HIGH (ref 11.5–15.5)
WBC Count: 3.1 10*3/uL — ABNORMAL LOW (ref 4.0–10.5)
nRBC: 0 % (ref 0.0–0.2)

## 2024-02-29 LAB — CMP (CANCER CENTER ONLY)
ALT: 25 U/L (ref 0–44)
AST: 37 U/L (ref 15–41)
Albumin: 3.6 g/dL (ref 3.5–5.0)
Alkaline Phosphatase: 109 U/L (ref 38–126)
Anion gap: 3 — ABNORMAL LOW (ref 5–15)
BUN: 10 mg/dL (ref 8–23)
CO2: 26 mmol/L (ref 22–32)
Calcium: 8.7 mg/dL — ABNORMAL LOW (ref 8.9–10.3)
Chloride: 110 mmol/L (ref 98–111)
Creatinine: 0.87 mg/dL (ref 0.61–1.24)
GFR, Estimated: >60 mL/min (ref >60)
Glucose, Bld: 118 mg/dL — ABNORMAL HIGH (ref 70–99)
Potassium: 4.7 mmol/L (ref 3.5–5.1)
Sodium: 139 mmol/L (ref 135–145)
Total Bilirubin: 0.4 mg/dL (ref 0.0–1.2)
Total Protein: 6.2 g/dL — ABNORMAL LOW (ref 6.5–8.1)

## 2024-02-29 MED ORDER — SODIUM CHLORIDE 0.9 % IV SOLN: INTRAVENOUS | Status: DC

## 2024-02-29 MED ORDER — PACLITAXEL PROTEIN-BOUND CHEMO INJECTION 100 MG
100.0000 mg/m2 | Freq: Once | INTRAVENOUS | Status: AC
  Administered 2024-02-29: 200 mg via INTRAVENOUS
  Filled 2024-02-29: qty 40

## 2024-02-29 MED ORDER — PROCHLORPERAZINE MALEATE 10 MG PO TABS
10.0000 mg | ORAL_TABLET | Freq: Once | ORAL | Status: AC
  Administered 2024-02-29: 10 mg via ORAL
  Filled 2024-02-29: qty 1

## 2024-02-29 MED ORDER — SODIUM CHLORIDE 0.9% FLUSH
10.0000 mL | Freq: Once | INTRAVENOUS | Status: AC
  Administered 2024-02-29: 10 mL

## 2024-02-29 MED ORDER — SODIUM CHLORIDE 0.9 % IV SOLN
1000.0000 mg/m2 | Freq: Once | INTRAVENOUS | Status: AC
  Administered 2024-02-29: 2014 mg via INTRAVENOUS
  Filled 2024-02-29: qty 52.97

## 2024-02-29 MED ORDER — HEPARIN SOD (PORK) LOCK FLUSH 100 UNIT/ML IV SOLN
500.0000 [IU] | Freq: Once | INTRAVENOUS | Status: AC | PRN
  Administered 2024-02-29: 500 [IU]

## 2024-02-29 MED ORDER — SODIUM CHLORIDE 0.9% FLUSH
10.0000 mL | INTRAVENOUS | Status: DC | PRN
Start: 2024-02-29 — End: 2024-02-29
  Administered 2024-02-29: 10 mL

## 2024-03-01 ENCOUNTER — Other Ambulatory Visit: Payer: Self-pay

## 2024-03-01 ENCOUNTER — Other Ambulatory Visit: Payer: Self-pay | Admitting: Hematology

## 2024-03-02 ENCOUNTER — Inpatient Hospital Stay

## 2024-03-02 VITALS — BP 153/64 | HR 87 | Resp 20

## 2024-03-02 DIAGNOSIS — C250 Malignant neoplasm of head of pancreas: Secondary | ICD-10-CM | POA: Diagnosis not present

## 2024-03-02 DIAGNOSIS — Z5111 Encounter for antineoplastic chemotherapy: Secondary | ICD-10-CM | POA: Diagnosis not present

## 2024-03-02 DIAGNOSIS — D6481 Anemia due to antineoplastic chemotherapy: Secondary | ICD-10-CM | POA: Diagnosis not present

## 2024-03-02 DIAGNOSIS — C7989 Secondary malignant neoplasm of other specified sites: Secondary | ICD-10-CM | POA: Diagnosis not present

## 2024-03-02 DIAGNOSIS — Z95828 Presence of other vascular implants and grafts: Secondary | ICD-10-CM

## 2024-03-02 DIAGNOSIS — Z79633 Long term (current) use of mitotic inhibitor: Secondary | ICD-10-CM | POA: Diagnosis not present

## 2024-03-02 DIAGNOSIS — Z79631 Long term (current) use of antimetabolite agent: Secondary | ICD-10-CM | POA: Diagnosis not present

## 2024-03-02 MED ORDER — FILGRASTIM-SNDZ 480 MCG/0.8ML IJ SOSY
480.0000 ug | PREFILLED_SYRINGE | Freq: Once | INTRAMUSCULAR | Status: AC
  Administered 2024-03-02: 480 ug via SUBCUTANEOUS
  Filled 2024-03-02: qty 0.8

## 2024-03-07 ENCOUNTER — Ambulatory Visit (HOSPITAL_COMMUNITY)
Admission: RE | Admit: 2024-03-07 | Discharge: 2024-03-07 | Disposition: A | Discharge status: Home / Self Care | Source: Ambulatory Visit | Attending: Hematology | Admitting: Hematology

## 2024-03-07 ENCOUNTER — Ambulatory Visit

## 2024-03-07 ENCOUNTER — Other Ambulatory Visit

## 2024-03-07 ENCOUNTER — Ambulatory Visit: Admitting: Hematology

## 2024-03-07 DIAGNOSIS — C259 Malignant neoplasm of pancreas, unspecified: Secondary | ICD-10-CM | POA: Diagnosis not present

## 2024-03-07 DIAGNOSIS — K5730 Diverticulosis of large intestine without perforation or abscess without bleeding: Secondary | ICD-10-CM | POA: Diagnosis not present

## 2024-03-07 DIAGNOSIS — N281 Cyst of kidney, acquired: Secondary | ICD-10-CM | POA: Diagnosis not present

## 2024-03-07 MED ORDER — SODIUM CHLORIDE (PF) 0.9 % IJ SOLN
INTRAMUSCULAR | Status: AC
  Filled 2024-03-07: qty 50

## 2024-03-07 MED ORDER — HEPARIN SOD (PORK) LOCK FLUSH 100 UNIT/ML IV SOLN
INTRAVENOUS | Status: AC
  Filled 2024-03-07: qty 5

## 2024-03-07 MED ORDER — IOHEXOL 300 MG/ML  SOLN: 100.0000 mL | Freq: Once | INTRAMUSCULAR | Status: DC | PRN

## 2024-03-07 MED ORDER — HEPARIN SOD (PORK) LOCK FLUSH 100 UNIT/ML IV SOLN
500.0000 [IU] | Freq: Once | INTRAVENOUS | Status: AC
  Administered 2024-03-07: 500 [IU] via INTRAVENOUS

## 2024-03-07 MED ORDER — IOHEXOL 350 MG/ML SOLN
100.0000 mL | Freq: Once | INTRAVENOUS | Status: AC | PRN
  Administered 2024-03-07: 80 mL via INTRAVENOUS

## 2024-03-13 NOTE — Assessment & Plan Note (Addendum)
 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 [name] showed 1.5 cm in pancreatic head, cytology confirmed adenocarcinoma.  An uncovered metal stent was placed in the common bile duct by Dr. [name] -he completed 3 months neoadjuvant FOLFIRINOX on 06/08/22 - 09/09/22, he tolerated well. Chemo changed to FOLFIRI with liposomal irinotecan  afterward  -restaging CT AP on 09/02/22 showed similar small amount of abnormal soft tissue adjacent to common bile duct stent (which is appropriately located), otherwise no new lesions or definitive signs of metastatic disease.  -We reviewed his case in GI tumor conference, unfortunately due to the tumor invasion of hepatic artery, our surgeons feel his pancreatic cancer is not resectable.  -His recent restaging CT scan at [facility] showed persistent tumor invasion of hepatic artery, Dr. [name] recommend SBRT, he completed at [facility]  -He was recently hospitalized for hyperbilirubinemia due to stent occlusion, and underwent ERCP again and stent exchange.  MRCP was done in the hospital, which showed no discrete pancreatic mass or evidence of metastasis. -Underwent Whipple with reconstruction of portal vein with saphenous graft 04/14/23 at [facility]  -Unfortunately he developed a local recurrence in December 2024 -He started chemo gemcitabine  and Abraxne on 12/12/2023 -restaging CT 03/08/2024 showed stable disease

## 2024-03-14 ENCOUNTER — Other Ambulatory Visit

## 2024-03-14 ENCOUNTER — Inpatient Hospital Stay

## 2024-03-14 ENCOUNTER — Ambulatory Visit

## 2024-03-14 ENCOUNTER — Encounter: Payer: Self-pay | Admitting: Hematology

## 2024-03-14 ENCOUNTER — Telehealth: Payer: Self-pay

## 2024-03-14 ENCOUNTER — Inpatient Hospital Stay (HOSPITAL_BASED_OUTPATIENT_CLINIC_OR_DEPARTMENT_OTHER): Admitting: Hematology

## 2024-03-14 ENCOUNTER — Other Ambulatory Visit: Payer: Self-pay

## 2024-03-14 VITALS — BP 140/58 | HR 70 | Temp 97.6°F | Resp 18 | Ht 68.0 in | Wt 205.1 lb

## 2024-03-14 DIAGNOSIS — C250 Malignant neoplasm of head of pancreas: Secondary | ICD-10-CM | POA: Diagnosis not present

## 2024-03-14 DIAGNOSIS — Z5111 Encounter for antineoplastic chemotherapy: Secondary | ICD-10-CM | POA: Diagnosis not present

## 2024-03-14 DIAGNOSIS — C259 Malignant neoplasm of pancreas, unspecified: Secondary | ICD-10-CM

## 2024-03-14 DIAGNOSIS — D6481 Anemia due to antineoplastic chemotherapy: Secondary | ICD-10-CM | POA: Diagnosis not present

## 2024-03-14 DIAGNOSIS — Z79631 Long term (current) use of antimetabolite agent: Secondary | ICD-10-CM | POA: Diagnosis not present

## 2024-03-14 DIAGNOSIS — Z79633 Long term (current) use of mitotic inhibitor: Secondary | ICD-10-CM | POA: Diagnosis not present

## 2024-03-14 DIAGNOSIS — Z95828 Presence of other vascular implants and grafts: Secondary | ICD-10-CM

## 2024-03-14 DIAGNOSIS — C7989 Secondary malignant neoplasm of other specified sites: Secondary | ICD-10-CM | POA: Diagnosis not present

## 2024-03-14 LAB — CMP (CANCER CENTER ONLY)
ALT: 17 U/L (ref 0–44)
AST: 25 U/L (ref 15–41)
Albumin: 3.4 g/dL — ABNORMAL LOW (ref 3.5–5.0)
Alkaline Phosphatase: 104 U/L (ref 38–126)
Anion gap: 3 — ABNORMAL LOW (ref 5–15)
BUN: 13 mg/dL (ref 8–23)
CO2: 26 mmol/L (ref 22–32)
Calcium: 8.4 mg/dL — ABNORMAL LOW (ref 8.9–10.3)
Chloride: 111 mmol/L (ref 98–111)
Creatinine: 1 mg/dL (ref 0.61–1.24)
GFR, Estimated: >60 mL/min (ref >60)
Glucose, Bld: 97 mg/dL (ref 70–99)
Potassium: 4.7 mmol/L (ref 3.5–5.1)
Sodium: 140 mmol/L (ref 135–145)
Total Bilirubin: 0.4 mg/dL (ref 0.0–1.2)
Total Protein: 5.8 g/dL — ABNORMAL LOW (ref 6.5–8.1)

## 2024-03-14 LAB — CBC WITH DIFFERENTIAL (CANCER CENTER ONLY)
Abs Immature Granulocytes: 0.06 10*3/uL (ref 0.00–0.07)
Basophils Absolute: 0 10*3/uL (ref 0.0–0.1)
Basophils Relative: 1 %
Eosinophils Absolute: 0.1 10*3/uL (ref 0.0–0.5)
Eosinophils Relative: 4 %
HCT: 25.9 % — ABNORMAL LOW (ref 39.0–52.0)
Hemoglobin: 8.4 g/dL — ABNORMAL LOW (ref 13.0–17.0)
Immature Granulocytes: 2 %
Lymphocytes Relative: 19 %
Lymphs Abs: 0.6 10*3/uL — ABNORMAL LOW (ref 0.7–4.0)
MCH: 30 pg (ref 26.0–34.0)
MCHC: 32.4 g/dL (ref 30.0–36.0)
MCV: 92.5 fL (ref 80.0–100.0)
Monocytes Absolute: 0.6 10*3/uL (ref 0.1–1.0)
Monocytes Relative: 18 %
Neutro Abs: 1.7 10*3/uL (ref 1.7–7.7)
Neutrophils Relative %: 56 %
Platelet Count: 276 10*3/uL (ref 150–400)
RBC: 2.8 MIL/uL — ABNORMAL LOW (ref 4.22–5.81)
RDW: 17.2 % — ABNORMAL HIGH (ref 11.5–15.5)
WBC Count: 3 10*3/uL — ABNORMAL LOW (ref 4.0–10.5)
nRBC: 0 % (ref 0.0–0.2)

## 2024-03-14 MED ORDER — PACLITAXEL PROTEIN-BOUND CHEMO INJECTION 100 MG
80.0000 mg/m2 | Freq: Once | INTRAVENOUS | Status: AC
  Administered 2024-03-14: 150 mg via INTRAVENOUS
  Filled 2024-03-14: qty 30

## 2024-03-14 MED ORDER — SODIUM CHLORIDE 0.9% FLUSH
10.0000 mL | INTRAVENOUS | Status: DC | PRN
  Administered 2024-03-14: 10 mL

## 2024-03-14 MED ORDER — PROCHLORPERAZINE MALEATE 10 MG PO TABS
10.0000 mg | ORAL_TABLET | Freq: Once | ORAL | Status: AC
  Administered 2024-03-14: 10 mg via ORAL
  Filled 2024-03-14: qty 1

## 2024-03-14 MED ORDER — HEPARIN SOD (PORK) LOCK FLUSH 100 UNIT/ML IV SOLN
500.0000 [IU] | Freq: Once | INTRAVENOUS | Status: AC | PRN
  Administered 2024-03-14: 500 [IU]

## 2024-03-14 MED ORDER — SODIUM CHLORIDE 0.9 % IV SOLN
800.0000 mg/m2 | Freq: Once | INTRAVENOUS | Status: AC
  Administered 2024-03-14: 1596 mg via INTRAVENOUS
  Filled 2024-03-14: qty 41.98

## 2024-03-14 MED ORDER — SODIUM CHLORIDE 0.9 % IV SOLN: INTRAVENOUS | Status: DC

## 2024-03-14 MED ORDER — SODIUM CHLORIDE 0.9% FLUSH
10.0000 mL | Freq: Once | INTRAVENOUS | Status: AC
  Administered 2024-03-14: 10 mL
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
## 2024-03-14 NOTE — Patient Instructions (Signed)
 [facility] - A DEPT OF [facility]  Discharge Instructions: Thank you for choosing [facility] to provide your oncology and hematology care.   If you have a lab appointment with the Cancer Center, please go directly to the Cancer Center and check in at the registration area.   Wear comfortable clothing and clothing appropriate for easy access to any Portacath or PICC line.   We strive to give you quality time with your provider. You may need to reschedule your appointment if you arrive late (15 or more minutes).  Arriving late affects you and other patients whose appointments are after yours.  Also, if you miss three or more appointments without notifying the office, you may be dismissed from the clinic at the provider's discretion.      For prescription refill requests, have your pharmacy contact our office and allow 72 hours for refills to be completed.    Today you received the following chemotherapy and/or immunotherapy agents: Abraxane, Gemzar      To help prevent nausea and vomiting after your treatment, we encourage you to take your nausea medication as directed.  BELOW ARE SYMPTOMS THAT SHOULD BE REPORTED IMMEDIATELY: *FEVER GREATER THAN 100.4 F (38 C) OR HIGHER *CHILLS OR SWEATING *NAUSEA AND VOMITING THAT IS NOT CONTROLLED WITH YOUR NAUSEA MEDICATION *UNUSUAL SHORTNESS OF BREATH *UNUSUAL BRUISING OR BLEEDING *URINARY PROBLEMS (pain or burning when urinating, or frequent urination) *BOWEL PROBLEMS (unusual diarrhea, constipation, pain near the anus) TENDERNESS IN MOUTH AND THROAT WITH OR WITHOUT PRESENCE OF ULCERS (sore throat, sores in mouth, or a toothache) UNUSUAL RASH, SWELLING OR PAIN  UNUSUAL VAGINAL DISCHARGE OR ITCHING   Items with * indicate a potential emergency and should be followed up as soon as possible or go to the Emergency Department if any problems should occur.  Please show the CHEMOTHERAPY ALERT CARD or  IMMUNOTHERAPY ALERT CARD at check-in to the Emergency Department and triage nurse.  Should you have questions after your visit or need to cancel or reschedule your appointment, please contact [facility] - A DEPT OF [facility]  Dept: [phone]  and follow the prompts.  Office hours are 8:00 a.m. to 4:30 p.m. Monday - Friday. Please note that voicemails left after 4:00 p.m. may not be returned until the following business day.  We are closed weekends and major holidays. You have access to a nurse at all times for urgent questions. Please call the main number to the clinic Dept: [phone] and follow the prompts.   For any non-urgent questions, you may also contact your provider using MyChart. We now offer e-Visits for anyone 73 and older to request care online for non-urgent symptoms. For details visit [email or web address].   Also download the MyChart app! Go to the app store, search "MyChart", open the app, select [place], and log in with your MyChart username and password.

## 2024-03-14 NOTE — Telephone Encounter (Addendum)
 Reached out to Dr. Kieth Pelt secretary at [facility] Rad-Onc, [phone] per Dr. Maryalice Smaller. Left voicemail requesting Dr. Audley Leap to contact Dr. Maryalice Smaller regarding patient's care at [phone].   ----- Message from Sonja [place] sent at 03/14/2024  3:43 PM EDT ----- Please call  Dr. Palta at [facility] (rad/onc) and request a call back, thx   Gracie Lav

## 2024-03-16 ENCOUNTER — Other Ambulatory Visit: Payer: Self-pay

## 2024-03-21 ENCOUNTER — Other Ambulatory Visit: Payer: Self-pay | Admitting: Internal Medicine

## 2024-03-21 ENCOUNTER — Ambulatory Visit

## 2024-03-21 ENCOUNTER — Other Ambulatory Visit

## 2024-03-21 DIAGNOSIS — E785 Hyperlipidemia, unspecified: Secondary | ICD-10-CM

## 2024-03-28 ENCOUNTER — Telehealth: Payer: Self-pay

## 2024-03-28 ENCOUNTER — Ambulatory Visit: Admitting: Hematology

## 2024-03-28 ENCOUNTER — Inpatient Hospital Stay: Admitting: Hematology

## 2024-03-28 ENCOUNTER — Ambulatory Visit

## 2024-03-28 ENCOUNTER — Other Ambulatory Visit

## 2024-03-28 ENCOUNTER — Inpatient Hospital Stay: Attending: Hematology

## 2024-03-28 ENCOUNTER — Inpatient Hospital Stay

## 2024-03-28 VITALS — BP 152/76 | HR 95 | Temp 97.6°F | Resp 18 | Ht 68.0 in | Wt 211.1 lb

## 2024-03-28 DIAGNOSIS — C259 Malignant neoplasm of pancreas, unspecified: Secondary | ICD-10-CM

## 2024-03-28 DIAGNOSIS — C250 Malignant neoplasm of head of pancreas: Secondary | ICD-10-CM | POA: Diagnosis not present

## 2024-03-28 DIAGNOSIS — Z79631 Long term (current) use of antimetabolite agent: Secondary | ICD-10-CM | POA: Diagnosis not present

## 2024-03-28 DIAGNOSIS — Z5111 Encounter for antineoplastic chemotherapy: Secondary | ICD-10-CM | POA: Insufficient documentation

## 2024-03-28 DIAGNOSIS — D6481 Anemia due to antineoplastic chemotherapy: Secondary | ICD-10-CM | POA: Insufficient documentation

## 2024-03-28 DIAGNOSIS — Z95828 Presence of other vascular implants and grafts: Secondary | ICD-10-CM

## 2024-03-28 LAB — CBC WITH DIFFERENTIAL (CANCER CENTER ONLY)
Abs Immature Granulocytes: 0.04 10*3/uL (ref 0.00–0.07)
Basophils Absolute: 0 10*3/uL (ref 0.0–0.1)
Basophils Relative: 1 %
Eosinophils Absolute: 0.2 10*3/uL (ref 0.0–0.5)
Eosinophils Relative: 6 %
HCT: 27 % — ABNORMAL LOW (ref 39.0–52.0)
Hemoglobin: 8.7 g/dL — ABNORMAL LOW (ref 13.0–17.0)
Immature Granulocytes: 1 %
Lymphocytes Relative: 17 %
Lymphs Abs: 0.6 10*3/uL — ABNORMAL LOW (ref 0.7–4.0)
MCH: 29.6 pg (ref 26.0–34.0)
MCHC: 32.2 g/dL (ref 30.0–36.0)
MCV: 91.8 fL (ref 80.0–100.0)
Monocytes Absolute: 0.8 10*3/uL (ref 0.1–1.0)
Monocytes Relative: 23 %
Neutro Abs: 1.9 10*3/uL (ref 1.7–7.7)
Neutrophils Relative %: 52 %
Platelet Count: 136 10*3/uL — ABNORMAL LOW (ref 150–400)
RBC: 2.94 MIL/uL — ABNORMAL LOW (ref 4.22–5.81)
RDW: 16 % — ABNORMAL HIGH (ref 11.5–15.5)
WBC Count: 3.6 10*3/uL — ABNORMAL LOW (ref 4.0–10.5)
nRBC: 0 % (ref 0.0–0.2)

## 2024-03-28 LAB — CMP (CANCER CENTER ONLY)
ALT: 15 U/L (ref 0–44)
AST: 22 U/L (ref 15–41)
Albumin: 3.3 g/dL — ABNORMAL LOW (ref 3.5–5.0)
Alkaline Phosphatase: 117 U/L (ref 38–126)
Anion gap: 4 — ABNORMAL LOW (ref 5–15)
BUN: 14 mg/dL (ref 8–23)
CO2: 29 mmol/L (ref 22–32)
Calcium: 8.5 mg/dL — ABNORMAL LOW (ref 8.9–10.3)
Chloride: 110 mmol/L (ref 98–111)
Creatinine: 1.04 mg/dL (ref 0.61–1.24)
GFR, Estimated: >60 mL/min (ref >60)
Glucose, Bld: 80 mg/dL (ref 70–99)
Potassium: 4.6 mmol/L (ref 3.5–5.1)
Sodium: 143 mmol/L (ref 135–145)
Total Bilirubin: 0.4 mg/dL (ref 0.0–1.2)
Total Protein: 5.8 g/dL — ABNORMAL LOW (ref 6.5–8.1)

## 2024-03-28 MED ORDER — SODIUM CHLORIDE 0.9% FLUSH
10.0000 mL | Freq: Once | INTRAVENOUS | Status: AC
  Administered 2024-03-28: 10 mL

## 2024-03-28 MED ORDER — SODIUM CHLORIDE 0.9 % IV SOLN
1000.0000 mg/m2 | Freq: Once | INTRAVENOUS | Status: AC
  Administered 2024-03-28: 2014 mg via INTRAVENOUS
  Filled 2024-03-28: qty 52.97

## 2024-03-28 MED ORDER — HEPARIN SOD (PORK) LOCK FLUSH 100 UNIT/ML IV SOLN
500.0000 [IU] | Freq: Once | INTRAVENOUS | Status: DC | PRN
Start: 2024-03-28 — End: 2024-03-28

## 2024-03-28 MED ORDER — SODIUM CHLORIDE 0.9% FLUSH: 10.0000 mL | INTRAVENOUS | Status: DC | PRN

## 2024-03-28 MED ORDER — SODIUM CHLORIDE 0.9 % IV SOLN: INTRAVENOUS | Status: DC

## 2024-03-28 MED ORDER — PROCHLORPERAZINE MALEATE 10 MG PO TABS
10.0000 mg | ORAL_TABLET | Freq: Once | ORAL | Status: AC
  Administered 2024-03-28: 10 mg via ORAL
  Filled 2024-03-28: qty 1

## 2024-03-28 NOTE — Telephone Encounter (Signed)
  Dr. Junita Oliva has not heard from Dr. Palta. Reached out to Dr. Kieth Pelt secretary at [facility] Rad-Onc, [phone] per Dr. Maryalice Smaller. Left voicemail requesting Dr. Audley Leap to contact Dr. Maryalice Smaller regarding patient's care at [phone].

## 2024-03-28 NOTE — Assessment & Plan Note (Signed)
 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 [name] showed 1.5 cm in pancreatic head, cytology confirmed adenocarcinoma.  An uncovered metal stent was placed in the common bile duct by Dr. [name] -he completed 3 months neoadjuvant FOLFIRINOX on 06/08/22 - 09/09/22, he tolerated well. Chemo changed to FOLFIRI with liposomal irinotecan  afterward  -restaging CT AP on 09/02/22 showed similar small amount of abnormal soft tissue adjacent to common bile duct stent (which is appropriately located), otherwise no new lesions or definitive signs of metastatic disease.  -We reviewed his case in GI tumor conference, unfortunately due to the tumor invasion of hepatic artery, our surgeons feel his pancreatic cancer is not resectable.  -His recent restaging CT scan at [facility] showed persistent tumor invasion of hepatic artery, Dr. [name] recommend SBRT, he completed at [facility]  -He was recently hospitalized for hyperbilirubinemia due to stent occlusion, and underwent ERCP again and stent exchange.  MRCP was done in the hospital, which showed no discrete pancreatic mass or evidence of metastasis. -Underwent Whipple with reconstruction of portal vein with saphenous graft 04/14/23 at [facility]  -Unfortunately he developed a local recurrence in December 2024 -He started chemo gemcitabine  and Abraxne on 12/12/2023 -restaging CT 03/08/2024 showed stable disease

## 2024-03-28 NOTE — Patient Instructions (Signed)
 [facility] - A DEPT OF [facility]  Discharge Instructions: Thank you for choosing [facility] to provide your oncology and hematology care.   If you have a lab appointment with the Cancer Center, please go directly to the Cancer Center and check in at the registration area.   Wear comfortable clothing and clothing appropriate for easy access to any Portacath or PICC line.   We strive to give you quality time with your provider. You may need to reschedule your appointment if you arrive late (15 or more minutes).  Arriving late affects you and other patients whose appointments are after yours.  Also, if you miss three or more appointments without notifying the office, you may be dismissed from the clinic at the provider's discretion.      For prescription refill requests, have your pharmacy contact our office and allow 72 hours for refills to be completed.    Today you received the following chemotherapy and/or immunotherapy agents gemzar      To help prevent nausea and vomiting after your treatment, we encourage you to take your nausea medication as directed.  BELOW ARE SYMPTOMS THAT SHOULD BE REPORTED IMMEDIATELY: *FEVER GREATER THAN 100.4 F (38 C) OR HIGHER *CHILLS OR SWEATING *NAUSEA AND VOMITING THAT IS NOT CONTROLLED WITH YOUR NAUSEA MEDICATION *UNUSUAL SHORTNESS OF BREATH *UNUSUAL BRUISING OR BLEEDING *URINARY PROBLEMS (pain or burning when urinating, or frequent urination) *BOWEL PROBLEMS (unusual diarrhea, constipation, pain near the anus) TENDERNESS IN MOUTH AND THROAT WITH OR WITHOUT PRESENCE OF ULCERS (sore throat, sores in mouth, or a toothache) UNUSUAL RASH, SWELLING OR PAIN  UNUSUAL VAGINAL DISCHARGE OR ITCHING   Items with * indicate a potential emergency and should be followed up as soon as possible or go to the Emergency Department if any problems should occur.  Please show the CHEMOTHERAPY ALERT CARD or IMMUNOTHERAPY  ALERT CARD at check-in to the Emergency Department and triage nurse.  Should you have questions after your visit or need to cancel or reschedule your appointment, please contact [facility] - A DEPT OF [facility]  Dept: [phone]  and follow the prompts.  Office hours are 8:00 a.m. to 4:30 p.m. Monday - Friday. Please note that voicemails left after 4:00 p.m. may not be returned until the following business day.  We are closed weekends and major holidays. You have access to a nurse at all times for urgent questions. Please call the main number to the clinic Dept: [phone] and follow the prompts.   For any non-urgent questions, you may also contact your provider using MyChart. We now offer e-Visits for anyone 19 and older to request care online for non-urgent symptoms. For details visit [email or web address].   Also download the MyChart app! Go to the app store, search "MyChart", open the app, select [facility], and log in with your MyChart username and password.
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
## 2024-03-29 NOTE — Progress Notes (Signed)
 Subjective:    Patient ID: [name], male    DOB: May 24, 1946, 78 y.o.   MRN: [id number]     HPI Beckham is here for follow up of his chronic medical problems.  Had local recurrence of his pancreatic cancer December 2024.  Had chemo 2 days ago.  Most recent CT scan 03/08/2024 showed stable disease.  One month ago started having swelling.  One month ago he fell at home  helping his wife.  His right wrist swelled up.  Has a brace on it.  He fell on his right knee.   Pain in right leg > left leg.  Both are swollen - they have been swollen for a while.  Taking lasix  daily - it is getting better.    Medications and allergies reviewed with patient and updated if appropriate.  Current Outpatient Medications on File Prior to Visit  Medication Sig Dispense Refill   ACCU-CHEK GUIDE test strip USE TO check blood sugar DAILY AS DIRECTED 100 strip 3   acetaminophen  (TYLENOL ) 650 MG CR tablet Take 1,300 mg by mouth as needed for pain.     albuterol  (VENTOLIN  HFA) 108 (90 Base) MCG/ACT inhaler Inhale 2 puffs into the lungs every 6 (six) hours as needed for wheezing or shortness of breath. Inhale 2 puffs in the lungs every 4 hours as needed for cough, wheezing, SOB. (Patient taking differently: Inhale 2 puffs into the lungs every 6 (six) hours as needed for wheezing or shortness of breath.) 18 g 11   Cyanocobalamin  (B-12) 50 MCG TABS Take by mouth daily.     Ferrous Sulfate (IRON) 325 (65 Fe) MG TABS Take by mouth.     finasteride  (PROSCAR ) 5 MG tablet Take 5 mg by mouth daily.     furosemide  (LASIX ) 40 MG tablet TAKE 1 TABLET BY MOUTH EVERY DAY 90 tablet 1   KLOR-CON  M20 20 MEQ tablet TAKE 1 TABLET BY MOUTH EVERY DAY 90 tablet 1   Lancets MISC Test blood sugar daily. Dx code: 250.00 100 each 3   lidocaine -prilocaine  (EMLA ) cream Apply 1 Application topically as needed. (Patient taking differently: Apply 1 Application topically as needed (port access).) 30 g 2   oxyCODONE  (ROXICODONE ) 5  MG immediate release tablet Take 1 tablet (5 mg total) by mouth every 8 (eight) hours as needed for severe pain (pain score 7-10). 30 tablet 0   pantoprazole  (PROTONIX ) 20 MG tablet TAKE 1 TABLET BY MOUTH EVERY DAY 90 tablet 2   prochlorperazine  (COMPAZINE ) 10 MG tablet Take 1 tablet (10 mg total) by mouth every 6 (six) hours as needed for nausea or vomiting. 30 tablet 1   silodosin (RAPAFLO) 8 MG CAPS capsule Take 8 mg by mouth daily.     simvastatin  (ZOCOR ) 20 MG tablet TAKE 1 TABLET BY MOUTH EVERYDAY AT BEDTIME 90 tablet 3   Current Facility-Administered Medications on File Prior to Visit  Medication Dose Route Frequency Provider Last Rate Last Admin   acetaminophen  (TYLENOL ) tablet 650 mg  650 mg Oral Once Sonja [place], MD       acetaminophen  (TYLENOL ) tablet 650 mg  650 mg Oral Q6H PRN Sonja Fourche, MD         Review of Systems  Constitutional:  Negative for fever.  Respiratory:  Positive for shortness of breath (from chemo). Negative for cough and wheezing.   Cardiovascular:  Positive for chest pain (a little pain) and leg swelling.  Neurological:  Positive for headaches. Negative  for dizziness and light-headedness.       Objective:   Vitals:   03/30/24 1534  BP: (!) 178/98  Pulse: 78  Temp: 98.5 F (36.9 C)  SpO2: 96%   BP Readings from Last 3 Encounters:  03/30/24 (!) 178/98  03/28/24 (!) 152/76  03/14/24 (!) 140/58   Wt Readings from Last 3 Encounters:  03/30/24 205 lb (93 kg)  03/28/24 211 lb 1.6 oz (95.8 kg)  03/14/24 205 lb 2 oz (93 kg)   Body mass index is 31.17 kg/m.    Physical Exam Constitutional:      General: He is not in acute distress.    Appearance: Normal appearance. He is not ill-appearing.  HENT:     Head: Normocephalic and atraumatic.  Eyes:     Conjunctiva/sclera: Conjunctivae normal.  Cardiovascular:     Rate and Rhythm: Normal rate and regular rhythm.     Heart sounds: Normal heart sounds.  Pulmonary:     Effort: Pulmonary effort is  normal. No respiratory distress.     Breath sounds: Normal breath sounds. No wheezing or rales.  Musculoskeletal:     Right lower leg: Edema (1 + lower leg and posterior upper leg) present.     Left lower leg: Edema (1 + lower leg and posterior upper leg) present.  Skin:    General: Skin is warm and dry.     Findings: No rash.  Neurological:     Mental Status: He is alert. Mental status is at baseline.  Psychiatric:        Mood and Affect: Mood normal.        Lab Results  Component Value Date   WBC 3.6 (L) 03/28/2024   HGB 8.7 (L) 03/28/2024   HCT 27.0 (L) 03/28/2024   PLT 136 (L) 03/28/2024   GLUCOSE 80 03/28/2024   CHOL 119 02/04/2022   TRIG 77.0 02/04/2022   HDL 40.20 02/04/2022   LDLCALC 64 02/04/2022   ALT 15 03/28/2024   AST 22 03/28/2024   NA 143 03/28/2024   K 4.6 03/28/2024   CL 110 03/28/2024   CREATININE 1.04 03/28/2024   BUN 14 03/28/2024   CO2 29 03/28/2024   TSH 3.00 07/31/2019   PSA 1.09 07/22/2017   INR 0.9 06/03/2022   HGBA1C 5.7 (H) 06/03/2022   MICROALBUR 0.3 11/25/2015     Assessment & Plan:    See Problem List for Assessment and Plan of chronic medical problems.

## 2024-03-29 NOTE — Patient Instructions (Addendum)
      Blood work was ordered.       Medications changes include :   None    A referral was ordered and someone will call you to schedule an appointment.     Return in about 6 months (around 09/30/2024) for Physical Exam.

## 2024-03-30 ENCOUNTER — Ambulatory Visit: Payer: Medicare Other | Admitting: Internal Medicine

## 2024-03-30 ENCOUNTER — Other Ambulatory Visit: Payer: Self-pay

## 2024-03-30 ENCOUNTER — Encounter: Payer: Self-pay | Admitting: Internal Medicine

## 2024-03-30 VITALS — BP 178/98 | HR 78 | Temp 98.5°F | Ht 68.0 in | Wt 205.0 lb

## 2024-03-30 DIAGNOSIS — R7303 Prediabetes: Secondary | ICD-10-CM | POA: Diagnosis not present

## 2024-03-30 DIAGNOSIS — I10 Essential (primary) hypertension: Secondary | ICD-10-CM

## 2024-03-30 DIAGNOSIS — E785 Hyperlipidemia, unspecified: Secondary | ICD-10-CM

## 2024-03-30 DIAGNOSIS — K219 Gastro-esophageal reflux disease without esophagitis: Secondary | ICD-10-CM | POA: Diagnosis not present

## 2024-03-30 DIAGNOSIS — R600 Localized edema: Secondary | ICD-10-CM | POA: Diagnosis not present

## 2024-03-30 DIAGNOSIS — J4520 Mild intermittent asthma, uncomplicated: Secondary | ICD-10-CM

## 2024-03-30 MED ORDER — LISINOPRIL 10 MG PO TABS: 10.0000 mg | ORAL_TABLET | Freq: Every day | ORAL | 3 refills | Status: DC

## 2024-03-31 ENCOUNTER — Other Ambulatory Visit: Payer: Self-pay

## 2024-03-31 NOTE — Assessment & Plan Note (Signed)
 Chronic ?GERD controlled ?Continue pantoprazole 20 mg daily ?

## 2024-03-31 NOTE — Assessment & Plan Note (Signed)
 Chronic Mild, intermittent Continue albuterol inhaler as needed - has not needed it in a long time

## 2024-03-31 NOTE — Assessment & Plan Note (Signed)
Chronic Lab Results  Component Value Date   HGBA1C 5.7 (H) 06/03/2022   Sugars controlled with diet

## 2024-03-31 NOTE — Assessment & Plan Note (Signed)
 Chronic Blood pressure elevated Has been on medication for a while - BP has been elevated several times Restart lisinopril  10 mg daily Monitior BP at home

## 2024-03-31 NOTE — Assessment & Plan Note (Signed)
Chronic Continue simvastatin 20 mg daily

## 2024-03-31 NOTE — Assessment & Plan Note (Signed)
 Chronic Increased swelling for a while - improving Continue Lasix  40 mg daily, potassium 20 mg daily Elevate legs more

## 2024-04-04 ENCOUNTER — Ambulatory Visit

## 2024-04-04 ENCOUNTER — Ambulatory Visit: Admitting: Nurse Practitioner

## 2024-04-04 ENCOUNTER — Other Ambulatory Visit

## 2024-04-10 NOTE — Assessment & Plan Note (Signed)
 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 [name] showed 1.5 cm in pancreatic head, cytology confirmed adenocarcinoma.  An uncovered metal stent was placed in the common bile duct by Dr. [name] -he completed 3 months neoadjuvant FOLFIRINOX on 06/08/22 - 09/09/22, he tolerated well. Chemo changed to FOLFIRI with liposomal irinotecan  afterward  -restaging CT AP on 09/02/22 showed similar small amount of abnormal soft tissue adjacent to common bile duct stent (which is appropriately located), otherwise no new lesions or definitive signs of metastatic disease.  -We reviewed his case in GI tumor conference, unfortunately due to the tumor invasion of hepatic artery, our surgeons feel his pancreatic cancer is not resectable.  -His recent restaging CT scan at [facility] showed persistent tumor invasion of hepatic artery, Dr. [name] recommend SBRT, he completed at [facility]  -He was recently hospitalized for hyperbilirubinemia due to stent occlusion, and underwent ERCP again and stent exchange.  MRCP was done in the hospital, which showed no discrete pancreatic mass or evidence of metastasis. -Underwent Whipple with reconstruction of portal vein with saphenous graft 04/14/23 at [facility]  -Unfortunately he developed a local recurrence in December 2024 -He started chemo gemcitabine  and Abraxne on 12/12/2023 -restaging CT 03/08/2024 showed stable disease

## 2024-04-11 ENCOUNTER — Encounter: Payer: Self-pay | Admitting: Hematology

## 2024-04-11 ENCOUNTER — Inpatient Hospital Stay: Admitting: Hematology

## 2024-04-11 ENCOUNTER — Inpatient Hospital Stay

## 2024-04-11 ENCOUNTER — Ambulatory Visit

## 2024-04-11 ENCOUNTER — Other Ambulatory Visit

## 2024-04-11 VITALS — BP 150/62 | HR 83 | Temp 98.2°F | Resp 21 | Ht 68.0 in | Wt 209.2 lb

## 2024-04-11 DIAGNOSIS — Z95828 Presence of other vascular implants and grafts: Secondary | ICD-10-CM

## 2024-04-11 DIAGNOSIS — C250 Malignant neoplasm of head of pancreas: Secondary | ICD-10-CM | POA: Diagnosis not present

## 2024-04-11 DIAGNOSIS — Z5111 Encounter for antineoplastic chemotherapy: Secondary | ICD-10-CM | POA: Diagnosis not present

## 2024-04-11 DIAGNOSIS — C259 Malignant neoplasm of pancreas, unspecified: Secondary | ICD-10-CM

## 2024-04-11 DIAGNOSIS — Z79631 Long term (current) use of antimetabolite agent: Secondary | ICD-10-CM | POA: Diagnosis not present

## 2024-04-11 DIAGNOSIS — D6481 Anemia due to antineoplastic chemotherapy: Secondary | ICD-10-CM | POA: Diagnosis not present

## 2024-04-11 LAB — CBC WITH DIFFERENTIAL (CANCER CENTER ONLY)
Abs Immature Granulocytes: 0.03 10*3/uL (ref 0.00–0.07)
Basophils Absolute: 0 10*3/uL (ref 0.0–0.1)
Basophils Relative: 1 %
Eosinophils Absolute: 0.2 10*3/uL (ref 0.0–0.5)
Eosinophils Relative: 5 %
HCT: 26.8 % — ABNORMAL LOW (ref 39.0–52.0)
Hemoglobin: 8.4 g/dL — ABNORMAL LOW (ref 13.0–17.0)
Immature Granulocytes: 1 %
Lymphocytes Relative: 17 %
Lymphs Abs: 0.6 10*3/uL — ABNORMAL LOW (ref 0.7–4.0)
MCH: 28.7 pg (ref 26.0–34.0)
MCHC: 31.3 g/dL (ref 30.0–36.0)
MCV: 91.5 fL (ref 80.0–100.0)
Monocytes Absolute: 0.7 10*3/uL (ref 0.1–1.0)
Monocytes Relative: 19 %
Neutro Abs: 2.2 10*3/uL (ref 1.7–7.7)
Neutrophils Relative %: 57 %
Platelet Count: 157 10*3/uL (ref 150–400)
RBC: 2.93 MIL/uL — ABNORMAL LOW (ref 4.22–5.81)
RDW: 15.5 % (ref 11.5–15.5)
WBC Count: 3.8 10*3/uL — ABNORMAL LOW (ref 4.0–10.5)
nRBC: 0 % (ref 0.0–0.2)

## 2024-04-11 LAB — CMP (CANCER CENTER ONLY)
ALT: 15 U/L (ref 0–44)
AST: 26 U/L (ref 15–41)
Albumin: 3.4 g/dL — ABNORMAL LOW (ref 3.5–5.0)
Alkaline Phosphatase: 113 U/L (ref 38–126)
Anion gap: 3 — ABNORMAL LOW (ref 5–15)
BUN: 11 mg/dL (ref 8–23)
CO2: 28 mmol/L (ref 22–32)
Calcium: 8.3 mg/dL — ABNORMAL LOW (ref 8.9–10.3)
Chloride: 112 mmol/L — ABNORMAL HIGH (ref 98–111)
Creatinine: 1.06 mg/dL (ref 0.61–1.24)
GFR, Estimated: >60 mL/min (ref >60)
Glucose, Bld: 109 mg/dL — ABNORMAL HIGH (ref 70–99)
Potassium: 4.4 mmol/L (ref 3.5–5.1)
Sodium: 143 mmol/L (ref 135–145)
Total Bilirubin: 0.4 mg/dL (ref 0.0–1.2)
Total Protein: 5.9 g/dL — ABNORMAL LOW (ref 6.5–8.1)

## 2024-04-11 MED ORDER — PROCHLORPERAZINE MALEATE 10 MG PO TABS
10.0000 mg | ORAL_TABLET | Freq: Once | ORAL | Status: AC
  Administered 2024-04-11: 10 mg via ORAL
  Filled 2024-04-11: qty 1

## 2024-04-11 MED ORDER — SODIUM CHLORIDE 0.9 % IV SOLN: INTRAVENOUS | Status: DC

## 2024-04-11 MED ORDER — SODIUM CHLORIDE 0.9% FLUSH
10.0000 mL | Freq: Once | INTRAVENOUS | Status: AC
  Administered 2024-04-11: 10 mL

## 2024-04-11 MED ORDER — SODIUM CHLORIDE 0.9 % IV SOLN
1000.0000 mg/m2 | Freq: Once | INTRAVENOUS | Status: AC
  Administered 2024-04-11: 2014 mg via INTRAVENOUS
  Filled 2024-04-11: qty 52.97
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
## 2024-04-11 NOTE — Patient Instructions (Signed)
 [facility] - A DEPT OF [facility]  Discharge Instructions: Thank you for choosing [facility] to provide your oncology and hematology care.   If you have a lab appointment with the Cancer Center, please go directly to the Cancer Center and check in at the registration area.   Wear comfortable clothing and clothing appropriate for easy access to any Portacath or PICC line.   We strive to give you quality time with your provider. You may need to reschedule your appointment if you arrive late (15 or more minutes).  Arriving late affects you and other patients whose appointments are after yours.  Also, if you miss three or more appointments without notifying the office, you may be dismissed from the clinic at the provider's discretion.      For prescription refill requests, have your pharmacy contact our office and allow 72 hours for refills to be completed.    Today you received the following chemotherapy and/or immunotherapy agents gemcitabine      To help prevent nausea and vomiting after your treatment, we encourage you to take your nausea medication as directed.  BELOW ARE SYMPTOMS THAT SHOULD BE REPORTED IMMEDIATELY: *FEVER GREATER THAN 100.4 F (38 C) OR HIGHER *CHILLS OR SWEATING *NAUSEA AND VOMITING THAT IS NOT CONTROLLED WITH YOUR NAUSEA MEDICATION *UNUSUAL SHORTNESS OF BREATH *UNUSUAL BRUISING OR BLEEDING *URINARY PROBLEMS (pain or burning when urinating, or frequent urination) *BOWEL PROBLEMS (unusual diarrhea, constipation, pain near the anus) TENDERNESS IN MOUTH AND THROAT WITH OR WITHOUT PRESENCE OF ULCERS (sore throat, sores in mouth, or a toothache) UNUSUAL RASH, SWELLING OR PAIN  UNUSUAL VAGINAL DISCHARGE OR ITCHING   Items with * indicate a potential emergency and should be followed up as soon as possible or go to the Emergency Department if any problems should occur.  Please show the CHEMOTHERAPY ALERT CARD or IMMUNOTHERAPY  ALERT CARD at check-in to the Emergency Department and triage nurse.  Should you have questions after your visit or need to cancel or reschedule your appointment, please contact [facility] - A DEPT OF [facility]  Dept: [phone]  and follow the prompts.  Office hours are 8:00 a.m. to 4:30 p.m. Monday - Friday. Please note that voicemails left after 4:00 p.m. may not be returned until the following business day.  We are closed weekends and major holidays. You have access to a nurse at all times for urgent questions. Please call the main number to the clinic Dept: [phone] and follow the prompts.   For any non-urgent questions, you may also contact your provider using MyChart. We now offer e-Visits for anyone 49 and older to request care online for non-urgent symptoms. For details visit [email or web address].   Also download the MyChart app! Go to the app store, search "MyChart", open the app, select [place], and log in with your MyChart username and password.

## 2024-04-13 ENCOUNTER — Telehealth: Payer: Self-pay | Admitting: Radiation Oncology

## 2024-04-13 ENCOUNTER — Telehealth: Payer: Self-pay

## 2024-04-13 NOTE — Telephone Encounter (Signed)
 LVM for pt stating that Dr. Maryalice Smaller has discussed his case with Dr. Jeryl Moris regarding radiation and someone from Dr. Alana Hoyle office will be contacting the pt to get him scheduled to see Dr. Jeryl Moris.  Instructed pt to give Dr. Candise Chambers office a call should he have additional questions or concerns.

## 2024-04-13 NOTE — Telephone Encounter (Signed)
 5/30 @ 9:47 am Left voicemail for patient to call our office to be schedule for consult.

## 2024-04-13 NOTE — Telephone Encounter (Signed)
 5/30 @ 9:38 am sent via stat fax requested for prior treatment and/or  DICOM records to be sent by email and fax copy of path report etc from [facility].  Waiting on records.

## 2024-04-16 ENCOUNTER — Telehealth: Payer: Self-pay | Admitting: Radiation Oncology

## 2024-04-16 NOTE — Telephone Encounter (Signed)
 6/2 @ 8:35 am Called patient, patient's spouse and daughter contact's numbers all go straight to voicemail.  Left voicemail for patient to call our office to be schedule for consult.

## 2024-04-17 ENCOUNTER — Ambulatory Visit
Admission: RE | Admit: 2024-04-17 | Discharge: 2024-04-17 | Disposition: A | Discharge status: Home / Self Care | Source: Ambulatory Visit | Attending: Radiation Oncology | Admitting: Radiation Oncology

## 2024-04-17 ENCOUNTER — Encounter: Payer: Self-pay | Admitting: Radiation Oncology

## 2024-04-17 ENCOUNTER — Telehealth: Payer: Self-pay | Admitting: Radiation Oncology

## 2024-04-17 VITALS — Ht 68.0 in | Wt 209.0 lb

## 2024-04-17 DIAGNOSIS — C250 Malignant neoplasm of head of pancreas: Secondary | ICD-10-CM

## 2024-04-17 NOTE — Progress Notes (Signed)
 GI Location of Tumor / Histology: Pancreatic cancer with tumor invasion  [name] was diagnosed with pancreatic cancer in July 2023.  He is s/p chemotherapy 2023.  Restaging scans showed tumor invasion of the hepatic artery, treated with SBRT at [facility].  He is s/p Whipple with reconstruction of portal vein with saphenous graft 04/14/2023.  He is here today to discuss palliative radiation therapy.  CT Abd/Pelvis 03/07/2024:  Postsurgical changes of Whipple with pancreaticoduodenectomy.  Similar appearance of infiltrative tissue above the pancreatic anastomosis and encasing the main portal vein. This demonstrated metabolic activity on the prior PET-CT suspicious for tumor recurrence.  No new metastatic disease in the abdomen or pelvis.  PET 12/02/2023: Surgical changes related to prior Whipple procedure. Moderate hypermetabolism in the pancreatic head near the anastomosis and biliary stent is suspicious for tumor recurrence.  No findings for metastatic disease involving the neck, chest, abdomen/pelvis or bony structures.    Past/Anticipated interventions by surgeon, if any:  Dr. [name] -Whipple with reconstruction of portal vein with saphenous graft 04/14/2023 at [facility]   Past/Anticipated interventions by medical oncology, if any:  Dr. Maryalice Smaller 04/11/2024 -Local recurrent in December 2024. -Chemo gemcitabine  and Abraxane  started 12/12/2023  -Neoadjuvant FOLFIRINOX 7/25-10/26/2023.    Weight changes, if any: He states his weight has been stable.    Bowel/Bladder complaints, if any: He reports occasional "runny" bowels.  Nausea / Vomiting, if any: No  Pain issues, if any:  No  Appetite:  He is eating and drinking well.    SAFETY ISSUES: Prior radiation? SBRT at [facility] with Dr. Audley Leap 06/2023. Pacemaker/ICD? No Possible current pregnancy? N/a Is the patient on methotrexate? No  Current Complaints/Details: -He reports that he feels weak and sick while taking chemo and would like to  avoid or decrease it.  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

## 2024-04-17 NOTE — Telephone Encounter (Signed)
 6/3 Follow up call to [facility] dept/Medical Records, still have not received DICOM records yet.  Waiting on records.
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
## 2024-04-18 ENCOUNTER — Telehealth: Payer: Self-pay | Admitting: Radiation Oncology

## 2024-04-18 ENCOUNTER — Inpatient Hospital Stay: Attending: Hematology | Admitting: Licensed Clinical Social Worker

## 2024-04-18 DIAGNOSIS — C259 Malignant neoplasm of pancreas, unspecified: Secondary | ICD-10-CM | POA: Insufficient documentation

## 2024-04-18 DIAGNOSIS — C250 Malignant neoplasm of head of pancreas: Secondary | ICD-10-CM | POA: Diagnosis not present

## 2024-04-18 DIAGNOSIS — Z79631 Long term (current) use of antimetabolite agent: Secondary | ICD-10-CM | POA: Insufficient documentation

## 2024-04-18 DIAGNOSIS — Z5111 Encounter for antineoplastic chemotherapy: Secondary | ICD-10-CM | POA: Insufficient documentation

## 2024-04-18 NOTE — Progress Notes (Signed)
 [facility] CSW Progress Note  Clinical Child psychotherapist contacted patient by phone to answer questions regarding financial resources.  Pt does not at this time receive any type of monetary compensation from the government and does not qualify for the [employer].  Pt reports having medical bills accumulating for both himself and his wife that he is no longer able to stay ahead of.  Pt has contacted billing and is on a payment plan for both himself an is wife.  CSW informed pt of the Project [facility] which could be used towards home bills.  CSW emailed the grant to pt's daughter.  Once completed pt will bring the grant to CSW to submit on his behalf.      Bruce Bruns, LCSW Clinical Social Worker [facility]

## 2024-04-18 NOTE — Telephone Encounter (Addendum)
 6/4 Called patient after received voicemail.  Patient return called to be scheduled for 6/12 -per [facility]. RN.  Email sent to CT Sim/Support RTT and copied Raguel Bunkers, so they are aware.

## 2024-04-20 ENCOUNTER — Other Ambulatory Visit: Payer: Self-pay

## 2024-04-20 ENCOUNTER — Other Ambulatory Visit: Payer: Self-pay | Admitting: Nurse Practitioner

## 2024-04-20 ENCOUNTER — Telehealth: Payer: Self-pay

## 2024-04-20 MED ORDER — OXYCODONE HCL 5 MG PO TABS: 5.0000 mg | ORAL_TABLET | Freq: Three times a day (TID) | ORAL | 0 refills | Status: AC | PRN

## 2024-04-20 NOTE — Telephone Encounter (Signed)
 Pt called requesting a refill on Oxycodone  5mg  IR.  Stated this nurse will make Dr. Candise Chambers NP Amaryllis Junior) aware of the pt's request.  Stated Pattie Borders will respond before the close of business today.  Pt verbalized understanding and had no further questions or concerns.

## 2024-04-24 NOTE — Progress Notes (Signed)
 Has armband been applied?  Yes.    Does patient have an allergy to IV contrast dye?: No.   Has patient ever received premedication for IV contrast dye?: No.   Does patient take metformin ?: No.  If patient does take metformin  when was the last dose: NA  Date of lab work: 04/11/2024 BUN: 11 CR: 1.06 eGfr: >60  IV site: Right chest  port cath  Has IV site been added to flowsheet?  Yes.    BP (!) 160/64 (BP Location: Left Arm, Patient Position: Sitting)   Pulse (!) 59   Temp (!) 97.3 F (36.3 C) (Temporal)   Resp 18   Ht 5' 8 (1.727 m)   Wt 199 lb 8 oz (90.5 kg)   SpO2 100%   BMI 30.33 kg/m

## 2024-04-25 ENCOUNTER — Inpatient Hospital Stay: Admitting: Nurse Practitioner

## 2024-04-25 ENCOUNTER — Inpatient Hospital Stay

## 2024-04-25 ENCOUNTER — Encounter: Payer: Self-pay | Admitting: Hematology

## 2024-04-25 ENCOUNTER — Encounter: Payer: Self-pay | Admitting: Nurse Practitioner

## 2024-04-25 VITALS — BP 148/78 | HR 67 | Temp 97.3°F | Resp 18 | Ht 68.0 in | Wt 201.7 lb

## 2024-04-25 DIAGNOSIS — Z95828 Presence of other vascular implants and grafts: Secondary | ICD-10-CM

## 2024-04-25 DIAGNOSIS — C259 Malignant neoplasm of pancreas, unspecified: Secondary | ICD-10-CM

## 2024-04-25 DIAGNOSIS — Z5111 Encounter for antineoplastic chemotherapy: Secondary | ICD-10-CM | POA: Diagnosis not present

## 2024-04-25 DIAGNOSIS — Z79631 Long term (current) use of antimetabolite agent: Secondary | ICD-10-CM | POA: Diagnosis not present

## 2024-04-25 LAB — CBC WITH DIFFERENTIAL (CANCER CENTER ONLY)
Abs Immature Granulocytes: 0.03 10*3/uL (ref 0.00–0.07)
Basophils Absolute: 0 10*3/uL (ref 0.0–0.1)
Basophils Relative: 1 %
Eosinophils Absolute: 0.1 10*3/uL (ref 0.0–0.5)
Eosinophils Relative: 3 %
HCT: 26.7 % — ABNORMAL LOW (ref 39.0–52.0)
Hemoglobin: 8.5 g/dL — ABNORMAL LOW (ref 13.0–17.0)
Immature Granulocytes: 1 %
Lymphocytes Relative: 24 %
Lymphs Abs: 0.7 10*3/uL (ref 0.7–4.0)
MCH: 28.4 pg (ref 26.0–34.0)
MCHC: 31.8 g/dL (ref 30.0–36.0)
MCV: 89.3 fL (ref 80.0–100.0)
Monocytes Absolute: 0.6 10*3/uL (ref 0.1–1.0)
Monocytes Relative: 21 %
Neutro Abs: 1.4 10*3/uL — ABNORMAL LOW (ref 1.7–7.7)
Neutrophils Relative %: 50 %
Platelet Count: 122 10*3/uL — ABNORMAL LOW (ref 150–400)
RBC: 2.99 MIL/uL — ABNORMAL LOW (ref 4.22–5.81)
RDW: 15 % (ref 11.5–15.5)
WBC Count: 2.8 10*3/uL — ABNORMAL LOW (ref 4.0–10.5)
nRBC: 0 % (ref 0.0–0.2)

## 2024-04-25 LAB — CMP (CANCER CENTER ONLY)
ALT: 14 U/L (ref 0–44)
AST: 25 U/L (ref 15–41)
Albumin: 3.5 g/dL (ref 3.5–5.0)
Alkaline Phosphatase: 106 U/L (ref 38–126)
Anion gap: 4 — ABNORMAL LOW (ref 5–15)
BUN: 15 mg/dL (ref 8–23)
CO2: 28 mmol/L (ref 22–32)
Calcium: 8.5 mg/dL — ABNORMAL LOW (ref 8.9–10.3)
Chloride: 111 mmol/L (ref 98–111)
Creatinine: 1.04 mg/dL (ref 0.61–1.24)
GFR, Estimated: >60 mL/min (ref >60)
Glucose, Bld: 91 mg/dL (ref 70–99)
Potassium: 4.3 mmol/L (ref 3.5–5.1)
Sodium: 143 mmol/L (ref 135–145)
Total Bilirubin: 0.4 mg/dL (ref 0.0–1.2)
Total Protein: 6.1 g/dL — ABNORMAL LOW (ref 6.5–8.1)

## 2024-04-25 MED ORDER — SODIUM CHLORIDE 0.9 % IV SOLN: INTRAVENOUS | Status: DC

## 2024-04-25 MED ORDER — PROCHLORPERAZINE MALEATE 10 MG PO TABS
10.0000 mg | ORAL_TABLET | Freq: Once | ORAL | Status: AC
  Administered 2024-04-25: 10 mg via ORAL
  Filled 2024-04-25: qty 1

## 2024-04-25 MED ORDER — SODIUM CHLORIDE 0.9% FLUSH
10.0000 mL | Freq: Once | INTRAVENOUS | Status: AC
  Administered 2024-04-25: 10 mL

## 2024-04-25 MED ORDER — SODIUM CHLORIDE 0.9% FLUSH
10.0000 mL | INTRAVENOUS | Status: DC | PRN
  Administered 2024-04-25: 10 mL

## 2024-04-25 MED ORDER — SODIUM CHLORIDE 0.9 % IV SOLN
1000.0000 mg/m2 | Freq: Once | INTRAVENOUS | Status: AC
  Administered 2024-04-25: 2014 mg via INTRAVENOUS
  Filled 2024-04-25: qty 52.97

## 2024-04-25 MED ORDER — HEPARIN SOD (PORK) LOCK FLUSH 100 UNIT/ML IV SOLN
500.0000 [IU] | Freq: Once | INTRAVENOUS | Status: AC | PRN
  Administered 2024-04-25: 500 [IU]
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
## 2024-04-25 NOTE — Patient Instructions (Signed)
 [facility] - A DEPT OF [facility]  Discharge Instructions: Thank you for choosing [facility] to provide your oncology and hematology care.   If you have a lab appointment with the Cancer Center, please go directly to the Cancer Center and check in at the registration area.   Wear comfortable clothing and clothing appropriate for easy access to any Portacath or PICC line.   We strive to give you quality time with your provider. You may need to reschedule your appointment if you arrive late (15 or more minutes).  Arriving late affects you and other patients whose appointments are after yours.  Also, if you miss three or more appointments without notifying the office, you may be dismissed from the clinic at the provider's discretion.      For prescription refill requests, have your pharmacy contact our office and allow 72 hours for refills to be completed.    Today you received the following chemotherapy and/or immunotherapy agents gemcitabine      To help prevent nausea and vomiting after your treatment, we encourage you to take your nausea medication as directed.  BELOW ARE SYMPTOMS THAT SHOULD BE REPORTED IMMEDIATELY: *FEVER GREATER THAN 100.4 F (38 C) OR HIGHER *CHILLS OR SWEATING *NAUSEA AND VOMITING THAT IS NOT CONTROLLED WITH YOUR NAUSEA MEDICATION *UNUSUAL SHORTNESS OF BREATH *UNUSUAL BRUISING OR BLEEDING *URINARY PROBLEMS (pain or burning when urinating, or frequent urination) *BOWEL PROBLEMS (unusual diarrhea, constipation, pain near the anus) TENDERNESS IN MOUTH AND THROAT WITH OR WITHOUT PRESENCE OF ULCERS (sore throat, sores in mouth, or a toothache) UNUSUAL RASH, SWELLING OR PAIN  UNUSUAL VAGINAL DISCHARGE OR ITCHING   Items with * indicate a potential emergency and should be followed up as soon as possible or go to the Emergency Department if any problems should occur.  Please show the CHEMOTHERAPY ALERT CARD or IMMUNOTHERAPY  ALERT CARD at check-in to the Emergency Department and triage nurse.  Should you have questions after your visit or need to cancel or reschedule your appointment, please contact [facility] - A DEPT OF [facility]  Dept: [phone]  and follow the prompts.  Office hours are 8:00 a.m. to 4:30 p.m. Monday - Friday. Please note that voicemails left after 4:00 p.m. may not be returned until the following business day.  We are closed weekends and major holidays. You have access to a nurse at all times for urgent questions. Please call the main number to the clinic Dept: [phone] and follow the prompts.   For any non-urgent questions, you may also contact your provider using MyChart. We now offer e-Visits for anyone 49 and older to request care online for non-urgent symptoms. For details visit [email or web address].   Also download the MyChart app! Go to the app store, search "MyChart", open the app, select [place], and log in with your MyChart username and password.

## 2024-04-27 ENCOUNTER — Ambulatory Visit
Admission: RE | Admit: 2024-04-27 | Discharge: 2024-04-27 | Disposition: A | Discharge status: Home / Self Care | Source: Ambulatory Visit | Attending: Radiation Oncology | Admitting: Radiation Oncology

## 2024-04-27 VITALS — BP 160/64 | HR 59 | Temp 97.3°F | Resp 18 | Ht 68.0 in | Wt 199.5 lb

## 2024-04-27 DIAGNOSIS — Z510 Encounter for antineoplastic radiation therapy: Secondary | ICD-10-CM | POA: Insufficient documentation

## 2024-04-27 DIAGNOSIS — C250 Malignant neoplasm of head of pancreas: Secondary | ICD-10-CM | POA: Insufficient documentation

## 2024-04-27 DIAGNOSIS — C259 Malignant neoplasm of pancreas, unspecified: Secondary | ICD-10-CM

## 2024-04-27 MED ORDER — SODIUM CHLORIDE 0.9% FLUSH
10.0000 mL | Freq: Once | INTRAVENOUS | Status: AC
  Administered 2024-04-27: 10 mL via INTRAVENOUS

## 2024-04-27 MED ORDER — HEPARIN SOD (PORK) LOCK FLUSH 100 UNIT/ML IV SOLN: 500.0000 [IU] | Freq: Once | INTRAVENOUS | Status: AC

## 2024-05-08 ENCOUNTER — Telehealth: Payer: Self-pay | Admitting: Hematology

## 2024-05-08 ENCOUNTER — Other Ambulatory Visit: Payer: Self-pay

## 2024-05-08 DIAGNOSIS — Z510 Encounter for antineoplastic radiation therapy: Secondary | ICD-10-CM | POA: Diagnosis not present

## 2024-05-08 DIAGNOSIS — C250 Malignant neoplasm of head of pancreas: Secondary | ICD-10-CM | POA: Diagnosis not present

## 2024-05-08 NOTE — Telephone Encounter (Signed)
 Canceled appointments per 6/24 secure chat from Dr.Feng. Called and left a VM with appointment details for the patient. Informed the patient via VM that his port flush/labs, MD visit and infusion for 6/25 were canceled as well as his 7/9 infusion appointment were also canceled. Patient was also reminded via VM that he is still scheduled for his daily [facility] appointment for 6/25.

## 2024-05-09 ENCOUNTER — Ambulatory Visit

## 2024-05-09 ENCOUNTER — Ambulatory Visit
Admission: RE | Admit: 2024-05-09 | Discharge: 2024-05-09 | Disposition: A | Discharge status: Home / Self Care | Source: Ambulatory Visit | Attending: Radiation Oncology | Admitting: Radiation Oncology

## 2024-05-09 ENCOUNTER — Other Ambulatory Visit

## 2024-05-09 ENCOUNTER — Other Ambulatory Visit: Payer: Self-pay

## 2024-05-09 ENCOUNTER — Ambulatory Visit: Admitting: Hematology

## 2024-05-09 ENCOUNTER — Telehealth: Payer: Self-pay | Admitting: Radiation Oncology

## 2024-05-09 DIAGNOSIS — Z510 Encounter for antineoplastic radiation therapy: Secondary | ICD-10-CM | POA: Diagnosis not present

## 2024-05-09 DIAGNOSIS — C250 Malignant neoplasm of head of pancreas: Secondary | ICD-10-CM | POA: Diagnosis not present

## 2024-05-09 LAB — RAD ONC ARIA SESSION SUMMARY
Course Elapsed Days: 0
Plan Fractions Treated to Date: 1
Plan Prescribed Dose Per Fraction: 6.6 Gy
Plan Total Fractions Prescribed: 5
Plan Total Prescribed Dose: 33 Gy
Reference Point Dosage Given to Date: 6.6 Gy
Reference Point Session Dosage Given: 6.6 Gy
Session Number: 1

## 2024-05-09 NOTE — Telephone Encounter (Signed)
 Received VM from pt with questions about upcoming tx. Message forwarded to support RTT for assistance.

## 2024-05-10 ENCOUNTER — Ambulatory Visit: Admitting: Radiation Oncology

## 2024-05-11 ENCOUNTER — Ambulatory Visit
Admission: RE | Admit: 2024-05-11 | Discharge: 2024-05-11 | Disposition: A | Discharge status: Home / Self Care | Source: Ambulatory Visit | Attending: Radiation Oncology | Admitting: Radiation Oncology

## 2024-05-11 ENCOUNTER — Other Ambulatory Visit: Payer: Self-pay

## 2024-05-11 DIAGNOSIS — Z510 Encounter for antineoplastic radiation therapy: Secondary | ICD-10-CM | POA: Diagnosis not present

## 2024-05-11 DIAGNOSIS — C250 Malignant neoplasm of head of pancreas: Secondary | ICD-10-CM | POA: Diagnosis not present

## 2024-05-11 LAB — RAD ONC ARIA SESSION SUMMARY
Course Elapsed Days: 2
Plan Fractions Treated to Date: 2
Plan Prescribed Dose Per Fraction: 6.6 Gy
Plan Total Fractions Prescribed: 5
Plan Total Prescribed Dose: 33 Gy
Reference Point Dosage Given to Date: 13.2 Gy
Reference Point Session Dosage Given: 6.6 Gy
Session Number: 2

## 2024-05-14 ENCOUNTER — Ambulatory Visit: Admitting: Radiation Oncology

## 2024-05-15 ENCOUNTER — Ambulatory Visit: Admitting: Radiation Oncology

## 2024-05-15 ENCOUNTER — Other Ambulatory Visit: Payer: Self-pay

## 2024-05-15 ENCOUNTER — Ambulatory Visit
Admission: RE | Admit: 2024-05-15 | Discharge: 2024-05-15 | Disposition: A | Discharge status: Home / Self Care | Source: Ambulatory Visit | Attending: Radiation Oncology | Admitting: Radiation Oncology

## 2024-05-15 DIAGNOSIS — Z510 Encounter for antineoplastic radiation therapy: Secondary | ICD-10-CM | POA: Insufficient documentation

## 2024-05-15 DIAGNOSIS — C250 Malignant neoplasm of head of pancreas: Secondary | ICD-10-CM | POA: Insufficient documentation

## 2024-05-15 LAB — RAD ONC ARIA SESSION SUMMARY
Course Elapsed Days: 6
Plan Fractions Treated to Date: 3
Plan Prescribed Dose Per Fraction: 6.6 Gy
Plan Total Fractions Prescribed: 5
Plan Total Prescribed Dose: 33 Gy
Reference Point Dosage Given to Date: 19.8 Gy
Reference Point Session Dosage Given: 6.6 Gy
Session Number: 3

## 2024-05-16 ENCOUNTER — Ambulatory Visit: Admitting: Radiation Oncology

## 2024-05-17 ENCOUNTER — Other Ambulatory Visit: Payer: Self-pay

## 2024-05-17 ENCOUNTER — Ambulatory Visit
Admission: RE | Admit: 2024-05-17 | Discharge: 2024-05-17 | Disposition: A | Discharge status: Home / Self Care | Source: Ambulatory Visit | Attending: Radiation Oncology | Admitting: Radiation Oncology

## 2024-05-17 DIAGNOSIS — C250 Malignant neoplasm of head of pancreas: Secondary | ICD-10-CM | POA: Diagnosis not present

## 2024-05-17 DIAGNOSIS — Z510 Encounter for antineoplastic radiation therapy: Secondary | ICD-10-CM | POA: Diagnosis not present

## 2024-05-17 LAB — RAD ONC ARIA SESSION SUMMARY
Course Elapsed Days: 8
Plan Fractions Treated to Date: 4
Plan Prescribed Dose Per Fraction: 6.6 Gy
Plan Total Fractions Prescribed: 5
Plan Total Prescribed Dose: 33 Gy
Reference Point Dosage Given to Date: 26.4 Gy
Reference Point Session Dosage Given: 6.6 Gy
Session Number: 4

## 2024-05-18 ENCOUNTER — Ambulatory Visit: Admitting: Radiation Oncology

## 2024-05-21 ENCOUNTER — Ambulatory Visit

## 2024-05-21 ENCOUNTER — Ambulatory Visit
Admission: RE | Admit: 2024-05-21 | Discharge: 2024-05-21 | Disposition: A | Discharge status: Home / Self Care | Source: Ambulatory Visit | Attending: Radiation Oncology | Admitting: Radiation Oncology

## 2024-05-21 ENCOUNTER — Other Ambulatory Visit: Payer: Self-pay

## 2024-05-21 ENCOUNTER — Telehealth: Payer: Self-pay

## 2024-05-21 DIAGNOSIS — C250 Malignant neoplasm of head of pancreas: Secondary | ICD-10-CM | POA: Diagnosis not present

## 2024-05-21 DIAGNOSIS — Z510 Encounter for antineoplastic radiation therapy: Secondary | ICD-10-CM | POA: Diagnosis not present

## 2024-05-21 LAB — RAD ONC ARIA SESSION SUMMARY
Course Elapsed Days: 12
Plan Fractions Treated to Date: 5
Plan Prescribed Dose Per Fraction: 6.6 Gy
Plan Total Fractions Prescribed: 5
Plan Total Prescribed Dose: 33 Gy
Reference Point Dosage Given to Date: 33 Gy
Reference Point Session Dosage Given: 6.6 Gy
Session Number: 5

## 2024-05-21 NOTE — Telephone Encounter (Signed)
 Copied from CRM [id number]. Topic: Clinical - Prescription Issue >> May 21, 2024 12:53 PM Chasity T wrote: Reason for CRM: Patient states that pharmacy has told him to contact office to see if the medication finasteride  (PROSCAR ) 5 MG tablet can be expedited for him to have because he does not have any at all. Please contact patient back about medication.

## 2024-05-21 NOTE — Progress Notes (Signed)
  Radiation Oncology         [phone] ________________________________  Name: [name] MRN: [id number]  Date: 05/21/2024  DOB: 01-20-1946  End of Treatment Note  Diagnosis:       Locally recurrent Stage IB, cT2N0M0 adenocarcinoma of the head of pancreas follow neoadjuvant chemotherapy, SBRT, and adjuvant chemotherapy   Indication for treatment:  palliative       Radiation treatment dates:   05/09/24-05/21/24  Site/dose:   The patient was treated to the pancreatic bed with a course of stereotactic body radiation treatment.  The patient received 33 Gray in 5 fractions using a SBRT/IMRT technique. Motion management was utilized for planning and treatment including a 4D CT at the time of simulation.  Narrative: The patient tolerated radiation treatment relatively well.  His BP was 182/74 and rechecked was 186/81 today in clinic. He denies any current chest pain, shortness of breath, or headaches, but has had intermittent chest pain in the weeks previous following his wife's unexpected death.   Plan: The patient has completed radiation treatment. His BP was high today after repeating it and he admits to significant stress after having lost his wife about 4 weeks ago and the difficulties in setting her will. He is taking his lisinopril  as prescribed and will check home BPs prior to meals and report these results to Dr. Geofm. If he notes sternal chest pain, shortness of breath, or headaches he will go to the ED. We will see him as needed and check in by phone in about a month.     Donald KYM Husband, PAC

## 2024-05-22 ENCOUNTER — Other Ambulatory Visit: Payer: Self-pay

## 2024-05-22 MED ORDER — FINASTERIDE 5 MG PO TABS: 5.0000 mg | ORAL_TABLET | Freq: Every day | ORAL | 1 refills | Status: DC

## 2024-05-22 NOTE — Radiation Completion Notes (Signed)
 Patient Name: [name], [name] MRN: [id number] Date of Birth: 10-10-1946 Referring Physician: ONITA MATTOCK, M.D. Date of Service: 2024-05-22 Radiation Oncologist: Norleen Limes, M.D. [facility]                             RADIATION ONCOLOGY END OF TREATMENT NOTE     Diagnosis: C25.0 Malignant neoplasm of head of pancreas Staging on 2022-05-25: Pancreatic adenocarcinoma (HCC) T=cT2, N=cN0, M=cM0 Intent: Curative     ==========DELIVERED PLANS==========  First Treatment Date: 2024-05-09 Last Treatment Date: 2024-05-21   Plan Name: Pancreas_SBRT Site: Pancreas Technique: SBRT/SRT-IMRT Mode: Photon Dose Per Fraction: 6.6 Gy Prescribed Dose (Delivered / Prescribed): 33 Gy / 33 Gy Prescribed Fxs (Delivered / Prescribed): 5 / 5     ==========ON TREATMENT VISIT DATES========== 2024-05-09, 2024-05-11, 2024-05-15, 2024-05-17     ==========UPCOMING VISITS==========       ==========APPENDIX - ON TREATMENT VISIT NOTES==========   See weekly On Treatment Notes in Epic for details in the Media tab (listed as Progress notes on the On Treatment Visit Dates listed above).

## 2024-05-22 NOTE — Telephone Encounter (Signed)
 Script sent to day.

## 2024-05-23 ENCOUNTER — Ambulatory Visit

## 2024-05-23 ENCOUNTER — Inpatient Hospital Stay

## 2024-05-23 ENCOUNTER — Inpatient Hospital Stay: Attending: Hematology | Admitting: Hematology

## 2024-05-23 VITALS — BP 142/72 | HR 59 | Temp 98.5°F | Resp 16 | Ht 68.0 in | Wt 198.0 lb

## 2024-05-23 DIAGNOSIS — D649 Anemia, unspecified: Secondary | ICD-10-CM | POA: Diagnosis not present

## 2024-05-23 DIAGNOSIS — C259 Malignant neoplasm of pancreas, unspecified: Secondary | ICD-10-CM | POA: Diagnosis not present

## 2024-05-23 DIAGNOSIS — I10 Essential (primary) hypertension: Secondary | ICD-10-CM | POA: Insufficient documentation

## 2024-05-23 DIAGNOSIS — C250 Malignant neoplasm of head of pancreas: Secondary | ICD-10-CM | POA: Diagnosis not present

## 2024-05-23 DIAGNOSIS — Z95828 Presence of other vascular implants and grafts: Secondary | ICD-10-CM

## 2024-05-23 DIAGNOSIS — M179 Osteoarthritis of knee, unspecified: Secondary | ICD-10-CM | POA: Insufficient documentation

## 2024-05-23 DIAGNOSIS — Z79899 Other long term (current) drug therapy: Secondary | ICD-10-CM | POA: Diagnosis not present

## 2024-05-23 LAB — CBC WITH DIFFERENTIAL (CANCER CENTER ONLY)
Abs Immature Granulocytes: 0.01 K/uL (ref 0.00–0.07)
Basophils Absolute: 0 K/uL (ref 0.0–0.1)
Basophils Relative: 1 %
Eosinophils Absolute: 0.2 K/uL (ref 0.0–0.5)
Eosinophils Relative: 7 %
HCT: 29.2 % — ABNORMAL LOW (ref 39.0–52.0)
Hemoglobin: 9.5 g/dL — ABNORMAL LOW (ref 13.0–17.0)
Immature Granulocytes: 0 %
Lymphocytes Relative: 16 %
Lymphs Abs: 0.4 K/uL — ABNORMAL LOW (ref 0.7–4.0)
MCH: 28.8 pg (ref 26.0–34.0)
MCHC: 32.5 g/dL (ref 30.0–36.0)
MCV: 88.5 fL (ref 80.0–100.0)
Monocytes Absolute: 0.5 K/uL (ref 0.1–1.0)
Monocytes Relative: 19 %
Neutro Abs: 1.4 K/uL — ABNORMAL LOW (ref 1.7–7.7)
Neutrophils Relative %: 57 %
Platelet Count: 160 K/uL (ref 150–400)
RBC: 3.3 MIL/uL — ABNORMAL LOW (ref 4.22–5.81)
RDW: 16.2 % — ABNORMAL HIGH (ref 11.5–15.5)
WBC Count: 2.5 K/uL — ABNORMAL LOW (ref 4.0–10.5)
nRBC: 0 % (ref 0.0–0.2)

## 2024-05-23 LAB — CMP (CANCER CENTER ONLY)
ALT: 14 U/L (ref 0–44)
AST: 28 U/L (ref 15–41)
Albumin: 3.5 g/dL (ref 3.5–5.0)
Alkaline Phosphatase: 107 U/L (ref 38–126)
Anion gap: 2 — ABNORMAL LOW (ref 5–15)
BUN: 15 mg/dL (ref 8–23)
CO2: 28 mmol/L (ref 22–32)
Calcium: 8.8 mg/dL — ABNORMAL LOW (ref 8.9–10.3)
Chloride: 112 mmol/L — ABNORMAL HIGH (ref 98–111)
Creatinine: 1.05 mg/dL (ref 0.61–1.24)
GFR, Estimated: >60 mL/min (ref >60)
Glucose, Bld: 114 mg/dL — ABNORMAL HIGH (ref 70–99)
Potassium: 5 mmol/L (ref 3.5–5.1)
Sodium: 142 mmol/L (ref 135–145)
Total Bilirubin: 0.3 mg/dL (ref 0.0–1.2)
Total Protein: 6.1 g/dL — ABNORMAL LOW (ref 6.5–8.1)

## 2024-05-23 MED ORDER — HEPARIN SOD (PORK) LOCK FLUSH 100 UNIT/ML IV SOLN
500.0000 [IU] | Freq: Once | INTRAVENOUS | Status: AC
  Administered 2024-05-23: 500 [IU]

## 2024-05-23 MED ORDER — SODIUM CHLORIDE 0.9% FLUSH
10.0000 mL | Freq: Once | INTRAVENOUS | Status: AC
Start: 2024-05-23 — End: 2024-05-23
  Administered 2024-05-23: 10 mL

## 2024-05-23 NOTE — Assessment & Plan Note (Addendum)
 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 Burnette showed 1.5 cm in pancreatic head, cytology confirmed adenocarcinoma.  An uncovered metal stent was placed in the common bile duct by Dr. Rosalie -he completed 3 months neoadjuvant FOLFIRINOX on 06/08/22 - 09/09/22, he tolerated well. Chemo changed to FOLFIRI with liposomal irinotecan  afterward  -restaging CT AP on 09/02/22 showed similar small amount of abnormal soft tissue adjacent to common bile duct stent (which is appropriately located), otherwise no new lesions or definitive signs of metastatic disease.  -We reviewed his case in GI tumor conference, unfortunately due to the tumor invasion of hepatic artery, our surgeons feel his pancreatic cancer is not resectable.  -His recent restaging CT scan at [facility] showed persistent tumor invasion of hepatic artery, Dr. Romero recommend SBRT, he completed at [facility]  -He was recently hospitalized for hyperbilirubinemia due to stent occlusion, and underwent ERCP again and stent exchange.  MRCP was done in the hospital, which showed no discrete pancreatic mass or evidence of metastasis. -Underwent Whipple with reconstruction of portal vein with saphenous graft 04/14/23 at [facility]  -Unfortunately he developed a local recurrence in December 2024 -He started chemo gemcitabine  and Abraxne on 12/12/2023 -restaging CT 03/08/2024 showed stable disease  -he started SBRT radiation on 05/09/2024 and completed on 05/21/2024
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
## 2024-05-24 ENCOUNTER — Ambulatory Visit: Admitting: Emergency Medicine

## 2024-05-24 ENCOUNTER — Encounter: Payer: Self-pay | Admitting: Emergency Medicine

## 2024-05-24 DIAGNOSIS — I10 Essential (primary) hypertension: Secondary | ICD-10-CM | POA: Diagnosis not present
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
## 2024-05-24 NOTE — Assessment & Plan Note (Addendum)
 BP Readings from Last 3 Encounters:  05/24/24 (!) 170/74  05/23/24 (!) 142/72  04/27/24 (!) 160/64  Clinical stable and asymptomatic at present time Cardiovascular risks associated with uncontrolled hypertension discussed Advised to increase lisinopril  to 20 mg daily Continue monitoring blood pressure readings at home daily for the next several weeks and contact the office if numbers persistently abnormal ED precautions given Follow-up with PCP within the next couple weeks

## 2024-05-24 NOTE — Patient Instructions (Signed)
 Increase lisinopril  to 20 mg daily Continue monitoring blood pressure readings at home daily for the next several weeks and contact the office if numbers persistently abnormal  Hypertension, Adult High blood pressure (hypertension) is when the force of blood pumping through the arteries is too strong. The arteries are the blood vessels that carry blood from the heart throughout the body. Hypertension forces the heart to work harder to pump blood and may cause arteries to become narrow or stiff. Untreated or uncontrolled hypertension can lead to a heart attack, heart failure, a stroke, kidney disease, and other problems. A blood pressure reading consists of a higher number over a lower number. Ideally, your blood pressure should be below 120/80. The first (top) number is called the systolic pressure. It is a measure of the pressure in your arteries as your heart beats. The second (bottom) number is called the diastolic pressure. It is a measure of the pressure in your arteries as the heart relaxes. What are the causes? The exact cause of this condition is not known. There are some conditions that result in high blood pressure. What increases the risk? Certain factors may make you more likely to develop high blood pressure. Some of these risk factors are under your control, including: Smoking. Not getting enough exercise or physical activity. Being overweight. Having too much fat, sugar, calories, or salt (sodium) in your diet. Drinking too much alcohol. Other risk factors include: Having a personal history of heart disease, diabetes, high cholesterol, or kidney disease. Stress. Having a family history of high blood pressure and high cholesterol. Having obstructive sleep apnea. Age. The risk increases with age. What are the signs or symptoms? High blood pressure may not cause symptoms. Very high blood pressure (hypertensive crisis) may cause: Headache. Fast or irregular heartbeats  (palpitations). Shortness of breath. Nosebleed. Nausea and vomiting. Vision changes. Severe chest pain, dizziness, and seizures. How is this diagnosed? This condition is diagnosed by measuring your blood pressure while you are seated, with your arm resting on a flat surface, your legs uncrossed, and your feet flat on the floor. The cuff of the blood pressure monitor will be placed directly against the skin of your upper arm at the level of your heart. Blood pressure should be measured at least twice using the same arm. Certain conditions can cause a difference in blood pressure between your right and left arms. If you have a high blood pressure reading during one visit or you have normal blood pressure with other risk factors, you may be asked to: Return on a different day to have your blood pressure checked again. Monitor your blood pressure at home for 1 week or longer. If you are diagnosed with hypertension, you may have other blood or imaging tests to help your health care provider understand your overall risk for other conditions. How is this treated? This condition is treated by making healthy lifestyle changes, such as eating healthy foods, exercising more, and reducing your alcohol intake. You may be referred for counseling on a healthy diet and physical activity. Your health care provider may prescribe medicine if lifestyle changes are not enough to get your blood pressure under control and if: Your systolic blood pressure is above 130. Your diastolic blood pressure is above 80. Your personal target blood pressure may vary depending on your medical conditions, your age, and other factors. Follow these instructions at home: Eating and drinking  Eat a diet that is high in fiber and potassium, and low in sodium,  added sugar, and fat. An example of this eating plan is called the DASH diet. DASH stands for Dietary Approaches to Stop Hypertension. To eat this way: Eat plenty of fresh fruits  and vegetables. Try to fill one half of your plate at each meal with fruits and vegetables. Eat whole grains, such as whole-wheat pasta, brown rice, or whole-grain bread. Fill about one fourth of your plate with whole grains. Eat or drink low-fat dairy products, such as skim milk or low-fat yogurt. Avoid fatty cuts of meat, processed or cured meats, and poultry with skin. Fill about one fourth of your plate with lean proteins, such as fish, chicken without skin, beans, eggs, or tofu. Avoid pre-made and processed foods. These tend to be higher in sodium, added sugar, and fat. Reduce your daily sodium intake. Many people with hypertension should eat less than 1,500 mg of sodium a day. Do not drink alcohol if: Your health care provider tells you not to drink. You are pregnant, may be pregnant, or are planning to become pregnant. If you drink alcohol: Limit how much you have to: 0-1 drink a day for women. 0-2 drinks a day for men. Know how much alcohol is in your drink. In the [place] one drink equals one 12 oz bottle of beer (355 mL), one 5 oz glass of wine (148 mL), or one 1 oz glass of hard liquor (44 mL). Lifestyle  Work with your health care provider to maintain a healthy body weight or to lose weight. Ask what an ideal weight is for you. Get at least 30 minutes of exercise that causes your heart to beat faster (aerobic exercise) most days of the week. Activities may include walking, swimming, or biking. Include exercise to strengthen your muscles (resistance exercise), such as Pilates or lifting weights, as part of your weekly exercise routine. Try to do these types of exercises for 30 minutes at least 3 days a week. Do not use any products that contain nicotine or tobacco. These products include cigarettes, chewing tobacco, and vaping devices, such as e-cigarettes. If you need help quitting, ask your health care provider. Monitor your blood pressure at home as told by your health care  provider. Keep all follow-up visits. This is important. Medicines Take over-the-counter and prescription medicines only as told by your health care provider. Follow directions carefully. Blood pressure medicines must be taken as prescribed. Do not skip doses of blood pressure medicine. Doing this puts you at risk for problems and can make the medicine less effective. Ask your health care provider about side effects or reactions to medicines that you should watch for. Contact a health care provider if you: Think you are having a reaction to a medicine you are taking. Have headaches that keep coming back (recurring). Feel dizzy. Have swelling in your ankles. Have trouble with your vision. Get help right away if you: Develop a severe headache or confusion. Have unusual weakness or numbness. Feel faint. Have severe pain in your chest or abdomen. Vomit repeatedly. Have trouble breathing. These symptoms may be an emergency. Get help right away. Call 911. Do not wait to see if the symptoms will go away. Do not drive yourself to the hospital. Summary Hypertension is when the force of blood pumping through your arteries is too strong. If this condition is not controlled, it may put you at risk for serious complications. Your personal target blood pressure may vary depending on your medical conditions, your age, and other factors. For most people,  a normal blood pressure is less than 120/80. Hypertension is treated with lifestyle changes, medicines, or a combination of both. Lifestyle changes include losing weight, eating a healthy, low-sodium diet, exercising more, and limiting alcohol. This information is not intended to replace advice given to you by your health care provider. Make sure you discuss any questions you have with your health care provider. Document Revised: 09/08/2021 Document Reviewed: 09/08/2021 Elsevier Patient Education  2024 [employer].

## 2024-05-25 ENCOUNTER — Telehealth: Payer: Self-pay | Admitting: Internal Medicine

## 2024-05-25 NOTE — Telephone Encounter (Signed)
 Patient is requesting a handicap placard or a license plate if he can get it.  Please call him and let him know when form is ready.  [phone]

## 2024-05-26 ENCOUNTER — Other Ambulatory Visit: Payer: Self-pay | Admitting: Internal Medicine

## 2024-05-27 ENCOUNTER — Other Ambulatory Visit: Payer: Self-pay | Admitting: Internal Medicine

## 2024-05-30 NOTE — Telephone Encounter (Signed)
 Spoke with patient today.   Paperwork left up front for pick up

## 2024-06-13 ENCOUNTER — Other Ambulatory Visit: Payer: Self-pay | Admitting: Internal Medicine

## 2024-06-14 DIAGNOSIS — Z8507 Personal history of malignant neoplasm of pancreas: Secondary | ICD-10-CM | POA: Diagnosis not present

## 2024-06-14 DIAGNOSIS — R195 Other fecal abnormalities: Secondary | ICD-10-CM | POA: Diagnosis not present

## 2024-06-14 DIAGNOSIS — I10 Essential (primary) hypertension: Secondary | ICD-10-CM | POA: Diagnosis not present

## 2024-06-14 DIAGNOSIS — Z86018 Personal history of other benign neoplasm: Secondary | ICD-10-CM | POA: Diagnosis not present

## 2024-06-16 ENCOUNTER — Other Ambulatory Visit: Payer: Self-pay | Admitting: Internal Medicine

## 2024-06-18 ENCOUNTER — Ambulatory Visit
Admission: RE | Admit: 2024-06-18 | Discharge: 2024-06-18 | Disposition: A | Discharge status: Home / Self Care | Source: Ambulatory Visit | Attending: Internal Medicine | Admitting: Internal Medicine

## 2024-06-18 DIAGNOSIS — C259 Malignant neoplasm of pancreas, unspecified: Secondary | ICD-10-CM

## 2024-06-18 NOTE — Progress Notes (Addendum)
  Radiation Oncology         [phone] ________________________________  Name: [name] MRN: [id number]  Date of Service: 06/18/2024  DOB: 1946/09/22  Post Treatment Telephone Note  First Treatment Date: 2024-05-09 Last Treatment Date: 2024-05-21  Diagnosis: C25.0 Malignant neoplasm of head of pancreas      The patient was not available for call today. Called x3

## 2024-07-05 DIAGNOSIS — K648 Other hemorrhoids: Secondary | ICD-10-CM | POA: Diagnosis not present

## 2024-07-05 DIAGNOSIS — K5730 Diverticulosis of large intestine without perforation or abscess without bleeding: Secondary | ICD-10-CM | POA: Diagnosis not present

## 2024-07-05 DIAGNOSIS — Z09 Encounter for follow-up examination after completed treatment for conditions other than malignant neoplasm: Secondary | ICD-10-CM | POA: Diagnosis not present

## 2024-07-05 DIAGNOSIS — Z860101 Personal history of adenomatous and serrated colon polyps: Secondary | ICD-10-CM | POA: Diagnosis not present

## 2024-07-17 ENCOUNTER — Other Ambulatory Visit: Payer: Self-pay

## 2024-07-17 ENCOUNTER — Telehealth: Payer: Self-pay | Admitting: Hematology

## 2024-07-17 DIAGNOSIS — C259 Malignant neoplasm of pancreas, unspecified: Secondary | ICD-10-CM

## 2024-07-18 ENCOUNTER — Inpatient Hospital Stay

## 2024-07-18 ENCOUNTER — Ambulatory Visit (HOSPITAL_COMMUNITY)

## 2024-07-25 ENCOUNTER — Ambulatory Visit: Admitting: Hematology

## 2024-07-27 ENCOUNTER — Inpatient Hospital Stay: Attending: Hematology

## 2024-07-27 ENCOUNTER — Ambulatory Visit (HOSPITAL_COMMUNITY)
Admission: RE | Admit: 2024-07-27 | Discharge: 2024-07-27 | Disposition: A | Discharge status: Home / Self Care | Source: Ambulatory Visit | Attending: Hematology | Admitting: Hematology

## 2024-07-27 ENCOUNTER — Inpatient Hospital Stay

## 2024-07-27 DIAGNOSIS — K5730 Diverticulosis of large intestine without perforation or abscess without bleeding: Secondary | ICD-10-CM | POA: Diagnosis not present

## 2024-07-27 DIAGNOSIS — G620 Drug-induced polyneuropathy: Secondary | ICD-10-CM | POA: Insufficient documentation

## 2024-07-27 DIAGNOSIS — T451X5A Adverse effect of antineoplastic and immunosuppressive drugs, initial encounter: Secondary | ICD-10-CM | POA: Insufficient documentation

## 2024-07-27 DIAGNOSIS — C250 Malignant neoplasm of head of pancreas: Secondary | ICD-10-CM | POA: Diagnosis not present

## 2024-07-27 DIAGNOSIS — C259 Malignant neoplasm of pancreas, unspecified: Secondary | ICD-10-CM

## 2024-07-27 DIAGNOSIS — R600 Localized edema: Secondary | ICD-10-CM | POA: Diagnosis not present

## 2024-07-27 DIAGNOSIS — R935 Abnormal findings on diagnostic imaging of other abdominal regions, including retroperitoneum: Secondary | ICD-10-CM | POA: Diagnosis not present

## 2024-07-27 DIAGNOSIS — R7989 Other specified abnormal findings of blood chemistry: Secondary | ICD-10-CM | POA: Insufficient documentation

## 2024-07-27 LAB — CMP (CANCER CENTER ONLY)
ALT: 20 U/L (ref 0–44)
AST: 24 U/L (ref 15–41)
Albumin: 3.9 g/dL (ref 3.5–5.0)
Alkaline Phosphatase: 122 U/L (ref 38–126)
Anion gap: 4 — ABNORMAL LOW (ref 5–15)
BUN: 32 mg/dL — ABNORMAL HIGH (ref 8–23)
CO2: 24 mmol/L (ref 22–32)
Calcium: 8.8 mg/dL — ABNORMAL LOW (ref 8.9–10.3)
Chloride: 112 mmol/L — ABNORMAL HIGH (ref 98–111)
Creatinine: 1.67 mg/dL — ABNORMAL HIGH (ref 0.61–1.24)
GFR, Estimated: 41 mL/min — ABNORMAL LOW (ref >60)
Glucose, Bld: 87 mg/dL (ref 70–99)
Potassium: 5.2 mmol/L — ABNORMAL HIGH (ref 3.5–5.1)
Sodium: 140 mmol/L (ref 135–145)
Total Bilirubin: 0.4 mg/dL (ref 0.0–1.2)
Total Protein: 6.4 g/dL — ABNORMAL LOW (ref 6.5–8.1)

## 2024-07-27 LAB — CBC WITH DIFFERENTIAL (CANCER CENTER ONLY)
Abs Immature Granulocytes: 0.01 K/uL (ref 0.00–0.07)
Basophils Absolute: 0 K/uL (ref 0.0–0.1)
Basophils Relative: 1 %
Eosinophils Absolute: 0.1 K/uL (ref 0.0–0.5)
Eosinophils Relative: 4 %
HCT: 30.5 % — ABNORMAL LOW (ref 39.0–52.0)
Hemoglobin: 10 g/dL — ABNORMAL LOW (ref 13.0–17.0)
Immature Granulocytes: 0 %
Lymphocytes Relative: 25 %
Lymphs Abs: 0.7 K/uL (ref 0.7–4.0)
MCH: 28.7 pg (ref 26.0–34.0)
MCHC: 32.8 g/dL (ref 30.0–36.0)
MCV: 87.4 fL (ref 80.0–100.0)
Monocytes Absolute: 0.4 K/uL (ref 0.1–1.0)
Monocytes Relative: 15 %
Neutro Abs: 1.5 K/uL — ABNORMAL LOW (ref 1.7–7.7)
Neutrophils Relative %: 55 %
Platelet Count: 107 K/uL — ABNORMAL LOW (ref 150–400)
RBC: 3.49 MIL/uL — ABNORMAL LOW (ref 4.22–5.81)
RDW: 14.7 % (ref 11.5–15.5)
WBC Count: 2.8 K/uL — ABNORMAL LOW (ref 4.0–10.5)
nRBC: 0 % (ref 0.0–0.2)

## 2024-07-27 MED ORDER — HEPARIN SOD (PORK) LOCK FLUSH 100 UNIT/ML IV SOLN
INTRAVENOUS | Status: AC
  Filled 2024-07-27: qty 5

## 2024-07-27 MED ORDER — HEPARIN SOD (PORK) LOCK FLUSH 100 UNIT/ML IV SOLN
500.0000 [IU] | Freq: Once | INTRAVENOUS | Status: AC
  Administered 2024-07-27: 500 [IU] via INTRAVENOUS

## 2024-07-27 MED ORDER — IOHEXOL 300 MG/ML  SOLN
80.0000 mL | Freq: Once | INTRAMUSCULAR | Status: AC | PRN
  Administered 2024-07-27: 80 mL via INTRAVENOUS

## 2024-07-29 ENCOUNTER — Ambulatory Visit: Payer: Self-pay | Admitting: Nurse Practitioner

## 2024-08-07 NOTE — Assessment & Plan Note (Signed)
 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 Burnette showed 1.5 cm in pancreatic head, cytology confirmed adenocarcinoma.  An uncovered metal stent was placed in the common bile duct by Dr. Rosalie -he completed 3 months neoadjuvant FOLFIRINOX on 06/08/22 - 09/09/22, he tolerated well. Chemo changed to FOLFIRI with liposomal irinotecan  afterward  -restaging CT AP on 09/02/22 showed similar small amount of abnormal soft tissue adjacent to common bile duct stent (which is appropriately located), otherwise no new lesions or definitive signs of metastatic disease.  -We reviewed his case in GI tumor conference, unfortunately due to the tumor invasion of hepatic artery, our surgeons feel his pancreatic cancer is not resectable.  -His recent restaging CT scan at [facility] showed persistent tumor invasion of hepatic artery, Dr. Romero recommend SBRT, he completed at [facility]  -He was recently hospitalized for hyperbilirubinemia due to stent occlusion, and underwent ERCP again and stent exchange.  MRCP was done in the hospital, which showed no discrete pancreatic mass or evidence of metastasis. -Underwent Whipple with reconstruction of portal vein with saphenous graft 04/14/23 at [facility]  -Unfortunately he developed a local recurrence in December 2024 -He started chemo gemcitabine  and Abraxne on 12/12/2023 -restaging CT 03/08/2024 showed stable disease  -he started SBRT radiation on 05/09/2024 and completed on 05/21/2024 -CT 07/27/2024 showed stable disease without evidence of progression

## 2024-08-08 ENCOUNTER — Inpatient Hospital Stay

## 2024-08-08 ENCOUNTER — Inpatient Hospital Stay (HOSPITAL_BASED_OUTPATIENT_CLINIC_OR_DEPARTMENT_OTHER): Admitting: Hematology

## 2024-08-08 VITALS — BP 140/70 | HR 50 | Temp 98.0°F | Resp 15 | Ht 68.0 in | Wt 199.7 lb

## 2024-08-08 DIAGNOSIS — G620 Drug-induced polyneuropathy: Secondary | ICD-10-CM | POA: Diagnosis not present

## 2024-08-08 DIAGNOSIS — R600 Localized edema: Secondary | ICD-10-CM | POA: Diagnosis not present

## 2024-08-08 DIAGNOSIS — C259 Malignant neoplasm of pancreas, unspecified: Secondary | ICD-10-CM | POA: Diagnosis not present

## 2024-08-08 DIAGNOSIS — C250 Malignant neoplasm of head of pancreas: Secondary | ICD-10-CM | POA: Diagnosis not present

## 2024-08-08 DIAGNOSIS — R7989 Other specified abnormal findings of blood chemistry: Secondary | ICD-10-CM | POA: Diagnosis not present

## 2024-08-08 DIAGNOSIS — T451X5A Adverse effect of antineoplastic and immunosuppressive drugs, initial encounter: Secondary | ICD-10-CM | POA: Diagnosis not present

## 2024-08-08 LAB — CMP (CANCER CENTER ONLY)
ALT: 21 U/L (ref 0–44)
AST: 27 U/L (ref 15–41)
Albumin: 3.8 g/dL (ref 3.5–5.0)
Alkaline Phosphatase: 113 U/L (ref 38–126)
Anion gap: 2 — ABNORMAL LOW (ref 5–15)
BUN: 22 mg/dL (ref 8–23)
CO2: 25 mmol/L (ref 22–32)
Calcium: 8.7 mg/dL — ABNORMAL LOW (ref 8.9–10.3)
Chloride: 115 mmol/L — ABNORMAL HIGH (ref 98–111)
Creatinine: 1.57 mg/dL — ABNORMAL HIGH (ref 0.61–1.24)
GFR, Estimated: 44 mL/min — ABNORMAL LOW (ref >60)
Glucose, Bld: 84 mg/dL (ref 70–99)
Potassium: 5.1 mmol/L (ref 3.5–5.1)
Sodium: 142 mmol/L (ref 135–145)
Total Bilirubin: 0.3 mg/dL (ref 0.0–1.2)
Total Protein: 6.3 g/dL — ABNORMAL LOW (ref 6.5–8.1)

## 2024-08-08 LAB — CBC WITH DIFFERENTIAL (CANCER CENTER ONLY)
Abs Immature Granulocytes: 0.01 K/uL (ref 0.00–0.07)
Basophils Absolute: 0 K/uL (ref 0.0–0.1)
Basophils Relative: 1 %
Eosinophils Absolute: 0.1 K/uL (ref 0.0–0.5)
Eosinophils Relative: 4 %
HCT: 32.5 % — ABNORMAL LOW (ref 39.0–52.0)
Hemoglobin: 10.6 g/dL — ABNORMAL LOW (ref 13.0–17.0)
Immature Granulocytes: 0 %
Lymphocytes Relative: 19 %
Lymphs Abs: 0.7 K/uL (ref 0.7–4.0)
MCH: 29 pg (ref 26.0–34.0)
MCHC: 32.6 g/dL (ref 30.0–36.0)
MCV: 88.8 fL (ref 80.0–100.0)
Monocytes Absolute: 0.5 K/uL (ref 0.1–1.0)
Monocytes Relative: 14 %
Neutro Abs: 2.2 K/uL (ref 1.7–7.7)
Neutrophils Relative %: 62 %
Platelet Count: 119 K/uL — ABNORMAL LOW (ref 150–400)
RBC: 3.66 MIL/uL — ABNORMAL LOW (ref 4.22–5.81)
RDW: 14.6 % (ref 11.5–15.5)
WBC Count: 3.5 K/uL — ABNORMAL LOW (ref 4.0–10.5)
nRBC: 0 % (ref 0.0–0.2)
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
## 2024-09-19 ENCOUNTER — Inpatient Hospital Stay: Attending: Hematology

## 2024-09-19 ENCOUNTER — Inpatient Hospital Stay (HOSPITAL_BASED_OUTPATIENT_CLINIC_OR_DEPARTMENT_OTHER): Admitting: Hematology

## 2024-09-19 VITALS — BP 138/80 | HR 54 | Temp 98.3°F | Resp 17 | Wt 206.5 lb

## 2024-09-19 DIAGNOSIS — C259 Malignant neoplasm of pancreas, unspecified: Secondary | ICD-10-CM

## 2024-09-19 DIAGNOSIS — C250 Malignant neoplasm of head of pancreas: Secondary | ICD-10-CM | POA: Insufficient documentation

## 2024-09-19 DIAGNOSIS — G620 Drug-induced polyneuropathy: Secondary | ICD-10-CM | POA: Insufficient documentation

## 2024-09-19 DIAGNOSIS — T451X5A Adverse effect of antineoplastic and immunosuppressive drugs, initial encounter: Secondary | ICD-10-CM | POA: Insufficient documentation

## 2024-09-19 LAB — CMP (CANCER CENTER ONLY)
ALT: 21 U/L (ref 0–44)
AST: 25 U/L (ref 15–41)
Albumin: 3.7 g/dL (ref 3.5–5.0)
Alkaline Phosphatase: 120 U/L (ref 38–126)
Anion gap: 4 — ABNORMAL LOW (ref 5–15)
BUN: 19 mg/dL (ref 8–23)
CO2: 24 mmol/L (ref 22–32)
Calcium: 8.8 mg/dL — ABNORMAL LOW (ref 8.9–10.3)
Chloride: 113 mmol/L — ABNORMAL HIGH (ref 98–111)
Creatinine: 1.35 mg/dL — ABNORMAL HIGH (ref 0.61–1.24)
GFR, Estimated: 53 mL/min — ABNORMAL LOW (ref >60)
Glucose, Bld: 87 mg/dL (ref 70–99)
Potassium: 4.9 mmol/L (ref 3.5–5.1)
Sodium: 141 mmol/L (ref 135–145)
Total Bilirubin: 0.4 mg/dL (ref 0.0–1.2)
Total Protein: 6.4 g/dL — ABNORMAL LOW (ref 6.5–8.1)

## 2024-09-19 LAB — CBC WITH DIFFERENTIAL (CANCER CENTER ONLY)
Abs Immature Granulocytes: 0.01 K/uL (ref 0.00–0.07)
Basophils Absolute: 0 K/uL (ref 0.0–0.1)
Basophils Relative: 1 %
Eosinophils Absolute: 0.1 K/uL (ref 0.0–0.5)
Eosinophils Relative: 2 %
HCT: 31.8 % — ABNORMAL LOW (ref 39.0–52.0)
Hemoglobin: 10.7 g/dL — ABNORMAL LOW (ref 13.0–17.0)
Immature Granulocytes: 0 %
Lymphocytes Relative: 25 %
Lymphs Abs: 1 K/uL (ref 0.7–4.0)
MCH: 29.2 pg (ref 26.0–34.0)
MCHC: 33.6 g/dL (ref 30.0–36.0)
MCV: 86.9 fL (ref 80.0–100.0)
Monocytes Absolute: 0.5 K/uL (ref 0.1–1.0)
Monocytes Relative: 13 %
Neutro Abs: 2.4 K/uL (ref 1.7–7.7)
Neutrophils Relative %: 59 %
Platelet Count: 124 K/uL — ABNORMAL LOW (ref 150–400)
RBC: 3.66 MIL/uL — ABNORMAL LOW (ref 4.22–5.81)
RDW: 14.4 % (ref 11.5–15.5)
WBC Count: 4 K/uL (ref 4.0–10.5)
nRBC: 0 % (ref 0.0–0.2)

## 2024-09-19 NOTE — Assessment & Plan Note (Signed)
 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 Burnette showed 1.5 cm in pancreatic head, cytology confirmed adenocarcinoma.  An uncovered metal stent was placed in the common bile duct by Dr. Rosalie -he completed 3 months neoadjuvant FOLFIRINOX on 06/08/22 - 09/09/22, he tolerated well. Chemo changed to FOLFIRI with liposomal irinotecan  afterward  -restaging CT AP on 09/02/22 showed similar small amount of abnormal soft tissue adjacent to common bile duct stent (which is appropriately located), otherwise no new lesions or definitive signs of metastatic disease.  -We reviewed his case in GI tumor conference, unfortunately due to the tumor invasion of hepatic artery, our surgeons feel his pancreatic cancer is not resectable.  -His recent restaging CT scan at [facility] showed persistent tumor invasion of hepatic artery, Dr. Romero recommend SBRT, he completed at [facility]  -He was recently hospitalized for hyperbilirubinemia due to stent occlusion, and underwent ERCP again and stent exchange.  MRCP was done in the hospital, which showed no discrete pancreatic mass or evidence of metastasis. -Underwent Whipple with reconstruction of portal vein with saphenous graft 04/14/23 at [facility]  -Unfortunately he developed a local recurrence in December 2024 -He started chemo gemcitabine  and Abraxne on 12/12/2023 -restaging CT 03/08/2024 showed stable disease  -he started SBRT radiation on 05/09/2024 and completed on 05/21/2024 -CT 07/27/2024 showed stable disease without evidence of progression
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
## 2024-09-25 ENCOUNTER — Encounter: Payer: Self-pay | Admitting: *Deleted

## 2024-09-25 NOTE — Progress Notes (Signed)
 [name]                                          MRN: [id number]   09/25/2024   The [facility] Quality Team Specialist reviewed this patient medical record for the purposes of chart review for care gap closure. The following were reviewed: chart review for care gap closure-kidney health evaluation for diabetes:eGFR  and uACR.    [facility] Quality Team

## 2024-09-30 NOTE — Patient Instructions (Addendum)
    Blood work was ordered.       Medications changes include :   None    A referral was ordered and someone will call you to schedule an appointment.     Return in about 6 months (around 03/31/2025) for Physical Exam.

## 2024-09-30 NOTE — Progress Notes (Unsigned)
 Subjective:    Patient ID: [name], male    DOB: July 07, 1946, 78 y.o.   MRN: [id number]     HPI Geran is here for follow up of his chronic medical problems.  Not doing much exercise.  He thinks he is eating well and sleeping well.  He has no concerns.  Medications and allergies reviewed with patient and updated if appropriate.  Current Outpatient Medications on File Prior to Visit  Medication Sig Dispense Refill   acetaminophen  (TYLENOL ) 650 MG CR tablet Take 1,300 mg by mouth as needed for pain.     albuterol  (VENTOLIN  HFA) 108 (90 Base) MCG/ACT inhaler Inhale 2 puffs into the lungs every 6 (six) hours as needed for wheezing or shortness of breath. Inhale 2 puffs in the lungs every 4 hours as needed for cough, wheezing, SOB. 18 g 11   Cyanocobalamin  (B-12) 50 MCG TABS Take by mouth daily.     Ferrous Sulfate (IRON) 325 (65 Fe) MG TABS Take by mouth.     finasteride  (PROSCAR ) 5 MG tablet TAKE 1 TABLET (5 MG TOTAL) BY MOUTH DAILY. 90 tablet 1   furosemide  (LASIX ) 40 MG tablet TAKE 1 TABLET BY MOUTH EVERY DAY (Patient taking differently: Take 40 mg by mouth daily. Prn) 90 tablet 1   Lancets MISC Test blood sugar daily. Dx code: 250.00 100 each 3   lidocaine -prilocaine  (EMLA ) cream Apply 1 Application topically as needed. 30 g 2   oxyCODONE  (ROXICODONE ) 5 MG immediate release tablet Take 1 tablet (5 mg total) by mouth every 8 (eight) hours as needed for severe pain (pain score 7-10). 30 tablet 0   pantoprazole  (PROTONIX ) 20 MG tablet TAKE 1 TABLET BY MOUTH EVERY DAY 90 tablet 3   potassium chloride  SA (KLOR-CON  M) 20 MEQ tablet TAKE 1 TABLET BY MOUTH EVERY DAY (Patient taking differently: Take 20 mEq by mouth daily. Prn) 90 tablet 1   prochlorperazine  (COMPAZINE ) 10 MG tablet Take 1 tablet (10 mg total) by mouth every 6 (six) hours as needed for nausea or vomiting. 30 tablet 1   silodosin (RAPAFLO) 8 MG CAPS capsule Take 8 mg by mouth daily.     simvastatin  (ZOCOR ) 20  MG tablet TAKE 1 TABLET BY MOUTH EVERYDAY AT BEDTIME 90 tablet 3   ACCU-CHEK GUIDE test strip USE TO check blood sugar DAILY AS DIRECTED 100 strip 3   Current Facility-Administered Medications on File Prior to Visit  Medication Dose Route Frequency Provider Last Rate Last Admin   acetaminophen  (TYLENOL ) tablet 650 mg  650 mg Oral Once Lanny Callander, MD       acetaminophen  (TYLENOL ) tablet 650 mg  650 mg Oral Q6H PRN Lanny Callander, MD         Review of Systems  Constitutional:  Negative for appetite change and fever.  HENT:  Negative for trouble swallowing.   Respiratory:  Negative for cough, shortness of breath and wheezing.   Cardiovascular:  Positive for leg swelling. Negative for chest pain and palpitations.  Gastrointestinal:  Positive for abdominal pain (occ).       No GERD  Neurological:  Negative for light-headedness and headaches.  Psychiatric/Behavioral:  Negative for sleep disturbance.        Objective:   Vitals:   10/01/24 1439  BP: (!) 140/70  Pulse: 62  Temp: 98.2 F (36.8 C)  SpO2: 100%   BP Readings from Last 3 Encounters:  10/01/24 (!) 140/70  09/19/24 138/80  08/08/24 ROLLEN)  140/70   Wt Readings from Last 3 Encounters:  10/01/24 201 lb (91.2 kg)  09/19/24 206 lb 8 oz (93.7 kg)  08/08/24 199 lb 11.2 oz (90.6 kg)   Body mass index is 27.26 kg/m.    Physical Exam Constitutional:      General: He is not in acute distress.    Appearance: Normal appearance. He is not ill-appearing.  HENT:     Head: Normocephalic and atraumatic.  Eyes:     Conjunctiva/sclera: Conjunctivae normal.  Cardiovascular:     Rate and Rhythm: Normal rate and regular rhythm.     Heart sounds: Normal heart sounds.  Pulmonary:     Effort: Pulmonary effort is normal. No respiratory distress.     Breath sounds: Normal breath sounds. No wheezing or rales.  Abdominal:     General: There is no distension.     Palpations: Abdomen is soft.     Tenderness: There is no abdominal tenderness.   Musculoskeletal:     Right lower leg: Edema (1+ slightly pitting) present.     Left lower leg: Edema (1+, slightly pitting) present.  Skin:    General: Skin is warm and dry.     Findings: No rash.  Neurological:     Mental Status: He is alert. Mental status is at baseline.  Psychiatric:        Mood and Affect: Mood normal.        Lab Results  Component Value Date   WBC 4.0 09/19/2024   HGB 10.7 (L) 09/19/2024   HCT 31.8 (L) 09/19/2024   PLT 124 (L) 09/19/2024   GLUCOSE 87 09/19/2024   CHOL 119 02/04/2022   TRIG 77.0 02/04/2022   HDL 40.20 02/04/2022   LDLCALC 64 02/04/2022   ALT 21 09/19/2024   AST 25 09/19/2024   NA 141 09/19/2024   K 4.9 09/19/2024   CL 113 (H) 09/19/2024   CREATININE 1.35 (H) 09/19/2024   BUN 19 09/19/2024   CO2 24 09/19/2024   TSH 3.00 07/31/2019   PSA 1.09 07/22/2017   INR 0.9 06/03/2022   HGBA1C 5.7 (H) 06/03/2022   MICROALBUR 0.3 11/25/2015     Assessment & Plan:    See Problem List for Assessment and Plan of chronic medical problems.

## 2024-10-01 ENCOUNTER — Ambulatory Visit: Admitting: Internal Medicine

## 2024-10-01 ENCOUNTER — Encounter: Payer: Self-pay | Admitting: Internal Medicine

## 2024-10-01 VITALS — BP 140/70 | HR 62 | Temp 98.2°F | Ht 72.0 in | Wt 201.0 lb

## 2024-10-01 DIAGNOSIS — E785 Hyperlipidemia, unspecified: Secondary | ICD-10-CM

## 2024-10-01 DIAGNOSIS — I10 Essential (primary) hypertension: Secondary | ICD-10-CM | POA: Diagnosis not present

## 2024-10-01 DIAGNOSIS — R7303 Prediabetes: Secondary | ICD-10-CM

## 2024-10-01 DIAGNOSIS — R600 Localized edema: Secondary | ICD-10-CM

## 2024-10-01 DIAGNOSIS — Z23 Encounter for immunization: Secondary | ICD-10-CM

## 2024-10-01 DIAGNOSIS — K219 Gastro-esophageal reflux disease without esophagitis: Secondary | ICD-10-CM

## 2024-10-01 MED ORDER — BISOPROLOL-HYDROCHLOROTHIAZIDE 10-6.25 MG PO TABS: ORAL_TABLET | ORAL | 3 refills | Status: AC

## 2024-10-01 NOTE — Assessment & Plan Note (Signed)
 Chronic Blood pressure slightly high, but adequately controlled for his age Stop lisinopril  He went back to his old medication bisoprolol -HCTZ 10-6.25 mg daily, which we will continue Reviewed recent CMP, CBC

## 2024-10-01 NOTE — Assessment & Plan Note (Signed)
 Chronic Increased swelling for a while -stable Continue Lasix  40 mg daily, potassium 20 mg daily as needed Elevate legs more Advised to get up and walk around if he sitting for long periods of time

## 2024-10-01 NOTE — Assessment & Plan Note (Signed)
 Chronic ?GERD controlled ?Continue pantoprazole 20 mg daily ?

## 2024-10-01 NOTE — Assessment & Plan Note (Signed)
 Chronic Lab Results  Component Value Date   HGBA1C 5.7 (H) 06/03/2022   Sugars controlled with diet Check A1c

## 2024-10-01 NOTE — Assessment & Plan Note (Signed)
 Chronic Lipid panel-Will send order with him to urology when they draw blood from his port Continue simvastatin  20 mg daily

## 2024-10-17 ENCOUNTER — Ambulatory Visit: Payer: Self-pay

## 2024-10-17 ENCOUNTER — Other Ambulatory Visit: Payer: Self-pay

## 2024-10-17 ENCOUNTER — Emergency Department (HOSPITAL_COMMUNITY)

## 2024-10-17 ENCOUNTER — Encounter (HOSPITAL_COMMUNITY): Payer: Self-pay

## 2024-10-17 ENCOUNTER — Emergency Department (HOSPITAL_COMMUNITY): Admission: EM | Admit: 2024-10-17 | Discharge: 2024-10-17 | Disposition: A | Discharge status: Home / Self Care

## 2024-10-17 DIAGNOSIS — Z79899 Other long term (current) drug therapy: Secondary | ICD-10-CM | POA: Diagnosis not present

## 2024-10-17 DIAGNOSIS — R0789 Other chest pain: Secondary | ICD-10-CM | POA: Diagnosis present

## 2024-10-17 DIAGNOSIS — I10 Essential (primary) hypertension: Secondary | ICD-10-CM | POA: Diagnosis not present

## 2024-10-17 LAB — HEPATIC FUNCTION PANEL
ALT: 20 U/L (ref 0–44)
AST: 27 U/L (ref 15–41)
Albumin: 3.3 g/dL — ABNORMAL LOW (ref 3.5–5.0)
Alkaline Phosphatase: 116 U/L (ref 38–126)
Bilirubin, Direct: 0.1 mg/dL (ref 0.0–0.2)
Indirect Bilirubin: 0.3 mg/dL (ref 0.3–0.9)
Total Bilirubin: 0.4 mg/dL (ref 0.0–1.2)
Total Protein: 6 g/dL — ABNORMAL LOW (ref 6.5–8.1)

## 2024-10-17 LAB — CBC
HCT: 35.6 % — ABNORMAL LOW (ref 39.0–52.0)
Hemoglobin: 11.4 g/dL — ABNORMAL LOW (ref 13.0–17.0)
MCH: 28.2 pg (ref 26.0–34.0)
MCHC: 32 g/dL (ref 30.0–36.0)
MCV: 88.1 fL (ref 80.0–100.0)
Platelets: 116 K/uL — ABNORMAL LOW (ref 150–400)
RBC: 4.04 MIL/uL — ABNORMAL LOW (ref 4.22–5.81)
RDW: 14 % (ref 11.5–15.5)
WBC: 3.3 K/uL — ABNORMAL LOW (ref 4.0–10.5)
nRBC: 0 % (ref 0.0–0.2)

## 2024-10-17 LAB — BASIC METABOLIC PANEL WITH GFR
Anion gap: 7 (ref 5–15)
BUN: 15 mg/dL (ref 8–23)
CO2: 23 mmol/L (ref 22–32)
Calcium: 8.7 mg/dL — ABNORMAL LOW (ref 8.9–10.3)
Chloride: 112 mmol/L — ABNORMAL HIGH (ref 98–111)
Creatinine, Ser: 1.36 mg/dL — ABNORMAL HIGH (ref 0.61–1.24)
GFR, Estimated: 53 mL/min — ABNORMAL LOW (ref >60)
Glucose, Bld: 98 mg/dL (ref 70–99)
Potassium: 4.9 mmol/L (ref 3.5–5.1)
Sodium: 142 mmol/L (ref 135–145)

## 2024-10-17 LAB — TROPONIN I (HIGH SENSITIVITY)
Troponin I (High Sensitivity): 9 ng/L (ref <18)
Troponin I (High Sensitivity): 9 ng/L (ref <18)

## 2024-10-17 LAB — LIPASE, BLOOD: Lipase: 26 U/L (ref 11–51)

## 2024-10-17 MED ORDER — AMLODIPINE BESYLATE 5 MG PO TABS: 5.0000 mg | ORAL_TABLET | Freq: Every day | ORAL | 0 refills | Status: AC

## 2024-10-17 MED ORDER — HEPARIN SOD (PORK) LOCK FLUSH 100 UNIT/ML IV SOLN
500.0000 [IU] | Freq: Once | INTRAVENOUS | Status: AC
  Administered 2024-10-17: 500 [IU]
  Filled 2024-10-17: qty 5

## 2024-10-17 NOTE — ED Provider Notes (Signed)
 [place] EMERGENCY DEPARTMENT AT [facility] Provider Note   CSN: [id number] Arrival date & time: 10/17/24  1112     Patient presents with: Chest Pain   [name] is a 78 y.o. male.   78 year old male presents for evaluation of chest pain.  States this started a couple days ago.  States its off-and-on located in the center of his chest and nonradiating.  Admits to some diarrhea a few days ago but he states he took Imodium and it has improved.  He denies any nausea, vomiting, shortness of breath, or any other symptoms or concerns at this time.        Prior to Admission medications   Medication Sig Start Date End Date Taking? Authorizing Provider  amLODipine (NORVASC) 5 MG tablet Take 1 tablet (5 mg total) by mouth daily. 10/17/24  Yes Latifa Noble L, DO  ACCU-CHEK GUIDE test strip USE TO check blood sugar DAILY AS DIRECTED 08/14/20   [name], MD  acetaminophen  (TYLENOL ) 650 MG CR tablet Take 1,300 mg by mouth as needed for pain.    [provider]  albuterol  (VENTOLIN  HFA) 108 (90 Base) MCG/ACT inhaler Inhale 2 puffs into the lungs every 6 (six) hours as needed for wheezing or shortness of breath. Inhale 2 puffs in the lungs every 4 hours as needed for cough, wheezing, SOB. 08/25/22   [name], MD  bisoprolol -hydrochlorothiazide  (ZIAC ) 10-6.25 MG tablet TAKE 1 TABLET BY MOUTH TWICE DAILY 10/01/24   [name], MD  Cyanocobalamin  (B-12) 50 MCG TABS Take by mouth daily.    [provider]  Ferrous Sulfate (IRON) 325 (65 Fe) MG TABS Take by mouth.    [provider]  finasteride  (PROSCAR ) 5 MG tablet TAKE 1 TABLET (5 MG TOTAL) BY MOUTH DAILY. 06/13/24   [name], MD  furosemide  (LASIX ) 40 MG tablet TAKE 1 TABLET BY MOUTH EVERY DAY Patient taking differently: Take 40 mg by mouth daily. Prn 05/28/24   [name], MD  Lancets MISC Test blood sugar daily. Dx code: 250.00 05/17/14   Hadassah Powell HERO, PA-C   lidocaine -prilocaine  (EMLA ) cream Apply 1 Application topically as needed. 06/01/22   Heilingoetter, Cassandra L, PA-C  oxyCODONE  (ROXICODONE ) 5 MG immediate release tablet Take 1 tablet (5 mg total) by mouth every 8 (eight) hours as needed for severe pain (pain score 7-10). 04/20/24   Hanford Powell BRAVO, NP  pantoprazole  (PROTONIX ) 20 MG tablet TAKE 1 TABLET BY MOUTH EVERY DAY 06/18/24   [name], MD  potassium chloride  SA (KLOR-CON  M) 20 MEQ tablet TAKE 1 TABLET BY MOUTH EVERY DAY Patient taking differently: Take 20 mEq by mouth daily. Prn 05/28/24   [name], MD  prochlorperazine  (COMPAZINE ) 10 MG tablet Take 1 tablet (10 mg total) by mouth every 6 (six) hours as needed for nausea or vomiting. 11/25/23   Lanny Callander, MD  silodosin (RAPAFLO) 8 MG CAPS capsule Take 8 mg by mouth daily. 07/08/22   [provider]  simvastatin  (ZOCOR ) 20 MG tablet TAKE 1 TABLET BY MOUTH EVERYDAY AT BEDTIME 03/21/24   [name], MD    Allergies: Patient has no known allergies.    Review of Systems  Constitutional:  Negative for chills and fever.  HENT:  Negative for ear pain and sore throat.   Eyes:  Negative for pain and visual disturbance.  Respiratory:  Negative for cough and shortness of breath.   Cardiovascular:  Positive  for chest pain. Negative for palpitations.  Gastrointestinal:  Positive for diarrhea. Negative for abdominal pain and vomiting.  Genitourinary:  Negative for dysuria and hematuria.  Musculoskeletal:  Negative for arthralgias and back pain.  Skin:  Negative for color change and rash.  Neurological:  Negative for seizures and syncope.  All other systems reviewed and are negative.   Updated Vital Signs BP (!) 169/98   Pulse (!) 45   Temp 98 F (36.7 C) (Oral)   Resp 12   Ht 5' 8 (1.727 m)   Wt 92.1 kg   SpO2 95%   BMI 30.87 kg/m   Physical Exam Vitals and nursing note reviewed.  Constitutional:      General: He is not in acute distress.    Appearance: Normal  appearance. He is well-developed. He is not ill-appearing.  HENT:     Head: Normocephalic and atraumatic.  Eyes:     Conjunctiva/sclera: Conjunctivae normal.  Cardiovascular:     Rate and Rhythm: Normal rate and regular rhythm.     Pulses: Normal pulses.     Heart sounds: Normal heart sounds. No murmur heard. Pulmonary:     Effort: Pulmonary effort is normal. No respiratory distress.     Breath sounds: Normal breath sounds. No stridor. No wheezing or rhonchi.  Abdominal:     Palpations: Abdomen is soft.     Tenderness: There is no abdominal tenderness.  Musculoskeletal:        General: No swelling.     Cervical back: Neck supple.  Skin:    General: Skin is warm and dry.     Capillary Refill: Capillary refill takes less than 2 seconds.  Neurological:     Mental Status: He is alert.  Psychiatric:        Mood and Affect: Mood normal.     (all labs ordered are listed, but only abnormal results are displayed) Labs Reviewed  BASIC METABOLIC PANEL WITH GFR - Abnormal; Notable for the following components:      Result Value   Chloride 112 (*)    Creatinine, Ser 1.36 (*)    Calcium  8.7 (*)    GFR, Estimated 53 (*)    All other components within normal limits  CBC - Abnormal; Notable for the following components:   WBC 3.3 (*)    RBC 4.04 (*)    Hemoglobin 11.4 (*)    HCT 35.6 (*)    Platelets 116 (*)    All other components within normal limits  HEPATIC FUNCTION PANEL - Abnormal; Notable for the following components:   Total Protein 6.0 (*)    Albumin 3.3 (*)    All other components within normal limits  LIPASE, BLOOD  TROPONIN I (HIGH SENSITIVITY)  TROPONIN I (HIGH SENSITIVITY)    EKG: EKG Interpretation Date/Time:  Wednesday October 17 2024 11:44:07 EST Ventricular Rate:  52 PR Interval:  167 QRS Duration:  94 QT Interval:  418 QTC Calculation: 389 R Axis:   20  Text Interpretation: Sinus rhythm Nonspecific T abnormalities, lateral leads Compared with prior  EKG from Confirmed by Gennaro Bouchard ([id number]) on 10/17/2024 11:50:53 AM  Radiology: [facility] Chest 2 View Result Date: 10/17/2024 EXAM: 2 VIEW(S) XRAY OF THE CHEST 10/17/2024 11:52:00 AM COMPARISON: Comparison 05/13/2023. CLINICAL HISTORY: CP CP FINDINGS: LINES, TUBES AND DEVICES: Right subclavian port-a-cath is unchanged. LUNGS AND PLEURA: Stable minimal left basilar subsegmental atelectasis or scarring. No pleural effusion. No pneumothorax. HEART AND MEDIASTINUM: No acute abnormality of the cardiac and  mediastinal silhouettes. BONES AND SOFT TISSUES: Status post surgical internal fixation of multiple old left rib fractures. IMPRESSION: 1. Stable minimal left basilar subsegmental atelectasis or scarring. Electronically signed by: Lynwood Seip MD 10/17/2024 12:28 PM EST RP Workstation: [id number]     Procedures   Medications Ordered in the ED  heparin  lock flush 100 unit/mL (500 Units Intracatheter Given 10/17/24 1455)                                    Medical Decision Making Cardiac monitor interpretation: Sinus rhythm, no ectopy  Patient here for chest pain that is resolved in the setting some elevated blood pressure.  Lab workup unremarkable he is feeling much better and no any chest pain.  Vitals are stable.  Will start him on amlodipine 5 mg advise close up with primary care.  Advised to return for new or worsening symptoms.  He feels comfortable being discharged.  Problems Addressed: Atypical chest pain: acute illness or injury Hypertension, unspecified type: acute illness or injury  Amount and/or Complexity of Data Reviewed External Data Reviewed: notes.    Details: Prior office records reviewed and patient seen recently for hypertension Labs: ordered. Decision-making details documented in ED Course.    Details: Ordered and reviewed by me and unremarkable, troponin negative Radiology: ordered and independent interpretation performed. Decision-making details documented in ED Course.     Details: Ordered and inter by me independently of radiology  Chest xray: Shows no acute abnormality ECG/medicine tests: ordered and independent interpretation performed. Decision-making details documented in ED Course.    Details: Ordered and interpreted by me in the absence of cardiology and shows sinus rhythm, no STEMI, no acute changes when compared with prior   Risk OTC drugs. Prescription drug management.     Final diagnoses:  Atypical chest pain  Hypertension, unspecified type    ED Discharge Orders          Ordered    amLODipine (NORVASC) 5 MG tablet  Daily        10/17/24 1434               Allene Furuya, Duwaine L, DO 10/17/24 1510

## 2024-10-17 NOTE — ED Notes (Signed)
 Patient dc by RN. Patient verbalizes understanding of instructions. Patient in wheelchair with caregiver to lobby. Port deaccessed by [employer]

## 2024-10-17 NOTE — Discharge Instructions (Signed)
 Take your amlodipine as prescribed, daily, for high blood pressure.  Follow-up with your primary care doctor in 1 week.  Call the office to make an appointment.  Return to the ER for any new or worsening symptoms.

## 2024-10-17 NOTE — Telephone Encounter (Signed)
 FYI Only or Action Required?: FYI only for provider: ED advised.  Patient was last seen in primary care on 10/01/2024 by [name], MD.  Called Nurse Triage reporting Hypertension.  Symptoms began several days ago.  Interventions attempted: OTC medications: Pepcid and Prescription medications: as prescribed, no missed doses.  Symptoms are: gradually worsening.  Triage Disposition: Go to ED Now (Notify PCP)  Patient/caregiver understands and will follow disposition?: Yes   Copied from CRM [id number]. Topic: Clinical - Red Word Triage >> Oct 17, 2024 10:14 AM China J wrote: [facility] that prompted transfer to Nurse Triage: Patient would like to schedule an appointment due to rising blood pressure.  His recent reading was 190/89 Reason for Disposition  [1] Systolic BP >= 160 OR Diastolic >= 100 AND [2] cardiac (e.g., breathing difficulty, chest pain) or neurologic symptoms (e.g., new-onset blurred or double vision, unsteady gait)  Answer Assessment - Initial Assessment Questions Additional info: Patient calling to schedule an appointment today, he states his pcp told him if his bp elevates to call for an appointment.  ED advised, patient reluctant but in agreement. Has transportation available and proceeding to [facility] ED now.     1. BLOOD PRESSURE: What is your blood pressure? Did you take at least two measurements 5 minutes apart?     AM 190/89, current 201/80 2. ONSET: When did you take your blood pressure?     This morning and just prior to calling in.  3. HOW: How did you take your blood pressure? (e.g., automatic home BP monitor, visiting nurse)     Home cuff  4. HISTORY: Do you have a history of high blood pressure?     yes 5. MEDICINES: Are you taking any medicines for blood pressure? Have you missed any doses recently?     As prescribed 6. OTHER SYMPTOMS: Do you have any symptoms? (e.g., blurred vision, chest pain, difficulty breathing, headache,  weakness)     Gas in chest-using pepcid and relieves pain, has some tightness in chest currently. Belching. Reports knot that feels like gas in chest for 3 days.  7. PREGNANCY: Is there any chance you are pregnant? When was your last menstrual period?  Protocols used: Blood Pressure - High-A-AH

## 2024-10-17 NOTE — ED Triage Notes (Signed)
 C/O substernal CP and headache that started 3 weeks ago. Denies SHOB, nausea, vomiting. Hx of pancreatic cancer. Axox4.

## 2024-10-17 NOTE — ED Notes (Signed)
 Port accessed by [employer]

## 2024-10-24 ENCOUNTER — Inpatient Hospital Stay: Attending: Hematology

## 2024-10-24 ENCOUNTER — Other Ambulatory Visit: Payer: Self-pay

## 2024-10-24 ENCOUNTER — Ambulatory Visit (HOSPITAL_COMMUNITY)
Admission: RE | Admit: 2024-10-24 | Discharge: 2024-10-24 | Disposition: A | Discharge status: Home / Self Care | Source: Ambulatory Visit | Attending: Hematology | Admitting: Hematology

## 2024-10-24 ENCOUNTER — Encounter (HOSPITAL_COMMUNITY): Payer: Self-pay

## 2024-10-24 DIAGNOSIS — R7303 Prediabetes: Secondary | ICD-10-CM

## 2024-10-24 DIAGNOSIS — G620 Drug-induced polyneuropathy: Secondary | ICD-10-CM | POA: Diagnosis not present

## 2024-10-24 DIAGNOSIS — T451X5A Adverse effect of antineoplastic and immunosuppressive drugs, initial encounter: Secondary | ICD-10-CM | POA: Insufficient documentation

## 2024-10-24 DIAGNOSIS — C250 Malignant neoplasm of head of pancreas: Secondary | ICD-10-CM | POA: Insufficient documentation

## 2024-10-24 DIAGNOSIS — C259 Malignant neoplasm of pancreas, unspecified: Secondary | ICD-10-CM | POA: Insufficient documentation

## 2024-10-24 DIAGNOSIS — E785 Hyperlipidemia, unspecified: Secondary | ICD-10-CM

## 2024-10-24 LAB — CMP (CANCER CENTER ONLY)
ALT: 13 U/L (ref 0–44)
AST: 23 U/L (ref 15–41)
Albumin: 3.9 g/dL (ref 3.5–5.0)
Alkaline Phosphatase: 140 U/L — ABNORMAL HIGH (ref 38–126)
Anion gap: 8 (ref 5–15)
BUN: 16 mg/dL (ref 8–23)
CO2: 24 mmol/L (ref 22–32)
Calcium: 9.1 mg/dL (ref 8.9–10.3)
Chloride: 110 mmol/L (ref 98–111)
Creatinine: 1.36 mg/dL — ABNORMAL HIGH (ref 0.61–1.24)
GFR, Estimated: 53 mL/min — ABNORMAL LOW (ref >60)
Glucose, Bld: 103 mg/dL — ABNORMAL HIGH (ref 70–99)
Potassium: 4.5 mmol/L (ref 3.5–5.1)
Sodium: 143 mmol/L (ref 135–145)
Total Bilirubin: 0.5 mg/dL (ref 0.0–1.2)
Total Protein: 6.6 g/dL (ref 6.5–8.1)

## 2024-10-24 LAB — CBC WITH DIFFERENTIAL (CANCER CENTER ONLY)
Abs Immature Granulocytes: 0.03 K/uL (ref 0.00–0.07)
Basophils Absolute: 0 K/uL (ref 0.0–0.1)
Basophils Relative: 1 %
Eosinophils Absolute: 0.1 K/uL (ref 0.0–0.5)
Eosinophils Relative: 3 %
HCT: 34.4 % — ABNORMAL LOW (ref 39.0–52.0)
Hemoglobin: 11.2 g/dL — ABNORMAL LOW (ref 13.0–17.0)
Immature Granulocytes: 1 %
Lymphocytes Relative: 23 %
Lymphs Abs: 0.8 K/uL (ref 0.7–4.0)
MCH: 28.1 pg (ref 26.0–34.0)
MCHC: 32.6 g/dL (ref 30.0–36.0)
MCV: 86.2 fL (ref 80.0–100.0)
Monocytes Absolute: 0.4 K/uL (ref 0.1–1.0)
Monocytes Relative: 12 %
Neutro Abs: 2.1 K/uL (ref 1.7–7.7)
Neutrophils Relative %: 60 %
Platelet Count: 116 K/uL — ABNORMAL LOW (ref 150–400)
RBC: 3.99 MIL/uL — ABNORMAL LOW (ref 4.22–5.81)
RDW: 13.8 % (ref 11.5–15.5)
WBC Count: 3.5 K/uL — ABNORMAL LOW (ref 4.0–10.5)
nRBC: 0 % (ref 0.0–0.2)

## 2024-10-24 MED ORDER — HEPARIN SOD (PORK) LOCK FLUSH 100 UNIT/ML IV SOLN
500.0000 [IU] | Freq: Once | INTRAVENOUS | Status: AC
  Administered 2024-10-24: 500 [IU] via INTRAVENOUS

## 2024-10-24 MED ORDER — SODIUM CHLORIDE (PF) 0.9 % IJ SOLN
INTRAMUSCULAR | Status: AC
  Filled 2024-10-24: qty 50

## 2024-10-24 MED ORDER — IOHEXOL 300 MG/ML  SOLN
75.0000 mL | Freq: Once | INTRAMUSCULAR | Status: AC | PRN
  Administered 2024-10-24: 100 mL via INTRAVENOUS

## 2024-10-24 MED ORDER — HEPARIN SOD (PORK) LOCK FLUSH 100 UNIT/ML IV SOLN
INTRAVENOUS | Status: AC
  Filled 2024-10-24: qty 5

## 2024-11-01 ENCOUNTER — Inpatient Hospital Stay: Admitting: Hematology

## 2024-11-01 VITALS — BP 142/70 | HR 62 | Temp 98.3°F | Resp 17 | Wt 203.5 lb

## 2024-11-01 DIAGNOSIS — C259 Malignant neoplasm of pancreas, unspecified: Secondary | ICD-10-CM | POA: Diagnosis not present

## 2024-11-01 DIAGNOSIS — C250 Malignant neoplasm of head of pancreas: Secondary | ICD-10-CM | POA: Diagnosis not present

## 2024-11-01 NOTE — Assessment & Plan Note (Signed)
 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 Burnette showed 1.5 cm in pancreatic head, cytology confirmed adenocarcinoma.  An uncovered metal stent was placed in the common bile duct by Dr. Rosalie -he completed 3 months neoadjuvant FOLFIRINOX on 06/08/22 - 09/09/22, he tolerated well. Chemo changed to FOLFIRI with liposomal irinotecan  afterward  -restaging CT AP on 09/02/22 showed similar small amount of abnormal soft tissue adjacent to common bile duct stent (which is appropriately located), otherwise no new lesions or definitive signs of metastatic disease.  -We reviewed his case in GI tumor conference, unfortunately due to the tumor invasion of hepatic artery, our surgeons feel his pancreatic cancer is not resectable.  -His recent restaging CT scan at [facility] showed persistent tumor invasion of hepatic artery, Dr. Romero recommend SBRT, he completed at [facility]  -He was recently hospitalized for hyperbilirubinemia due to stent occlusion, and underwent ERCP again and stent exchange.  MRCP was done in the hospital, which showed no discrete pancreatic mass or evidence of metastasis. -Underwent Whipple with reconstruction of portal vein with saphenous graft 04/14/23 at [facility]  -Unfortunately he developed a local recurrence in December 2024 -He started chemo gemcitabine  and Abraxne on 12/12/2023 -restaging CT 03/08/2024 showed stable disease  -he started SBRT radiation on 05/09/2024 and completed on 05/21/2024 -CT 07/27/2024 showed stable disease without evidence of progression
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
## 2024-11-11 ENCOUNTER — Other Ambulatory Visit: Payer: Self-pay | Admitting: Internal Medicine

## 2024-11-13 NOTE — Progress Notes (Signed)
 TYHEIM VANALSTYNE                                          MRN: [id number]   11/13/2024   The [facility] Quality Team Specialist reviewed this patient medical record for the purposes of chart review for care gap closure. The following were reviewed: chart review for care gap closure-kidney health evaluation for diabetes:eGFR  and uACR.    [facility]BCI Quality Team

## 2024-11-20 LAB — OPHTHALMOLOGY REPORT-SCANNED

## 2024-11-22 ENCOUNTER — Ambulatory Visit (INDEPENDENT_AMBULATORY_CARE_PROVIDER_SITE_OTHER)

## 2024-11-22 VITALS — Ht 69.0 in | Wt 203.0 lb

## 2024-11-22 DIAGNOSIS — Z0000 Encounter for general adult medical examination without abnormal findings: Secondary | ICD-10-CM
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
## 2024-11-22 NOTE — Patient Instructions (Signed)
 [name],  Thank you for taking the time for your Medicare Wellness Visit. I appreciate your continued commitment to your health goals. Please review the care plan we discussed, and feel free to reach out if I can assist you further.  Please note that Annual Wellness Visits do not include a physical exam. Some assessments may be limited, especially if the visit was conducted virtually. If needed, we may recommend an in-person follow-up with your provider.  Ongoing Care Seeing your primary care provider every 3 to 6 months helps us  monitor your health and provide consistent, personalized care. Next office visit on 04/01/2025.  Each day, aim for 6 glasses of water, plenty of protein in your diet and try to get up and walk/ stretch every hour for 5-10 minutes at a time.    Referrals If a referral was made during today's visit and you haven't received any updates within two weeks, please contact the referred provider directly to check on the status.  Recommended Screenings:  Health Maintenance  Topic Date Due   COVID-19 Vaccine (5 - 2025-26 season) 07/16/2024   Medicare Annual Wellness Visit  12/13/2024   DTaP/Tdap/Td vaccine (3 - Td or Tdap) 03/24/2025   Colon Cancer Screening  06/26/2029   Pneumococcal Vaccine for age over 52  Completed   Flu Shot  Completed   Zoster (Shingles) Vaccine  Completed   Meningitis B Vaccine  Aged Out   Hepatitis C Screening  Discontinued       11/22/2024    2:25 PM  Advanced Directives  Does Patient Have a Medical Advance Directive? Yes  Type of Advance Directive Healthcare Power of Attorney  Copy of Healthcare Power of Attorney in Chart? No - copy requested    Vision: Annual vision screenings are recommended for early detection of glaucoma, cataracts, and diabetic retinopathy. These exams can also reveal signs of chronic conditions such as diabetes and high blood pressure.  Dental: Annual dental screenings help detect early signs of oral cancer, gum  disease, and other conditions linked to overall health, including heart disease and diabetes.  Please see the attached documents for additional preventive care recommendations.

## 2024-12-11 ENCOUNTER — Telehealth: Payer: Self-pay | Admitting: Hematology

## 2024-12-11 NOTE — Telephone Encounter (Signed)
 Attempted to reschedule pf that was cancelled 1/28

## 2024-12-12 ENCOUNTER — Inpatient Hospital Stay

## 2024-12-13 ENCOUNTER — Inpatient Hospital Stay: Attending: Hematology

## 2024-12-13 DIAGNOSIS — C259 Malignant neoplasm of pancreas, unspecified: Secondary | ICD-10-CM

## 2024-12-13 LAB — CBC WITH DIFFERENTIAL (CANCER CENTER ONLY)
Abs Immature Granulocytes: 0.01 10*3/uL (ref 0.00–0.07)
Basophils Absolute: 0 10*3/uL (ref 0.0–0.1)
Basophils Relative: 1 %
Eosinophils Absolute: 0.1 10*3/uL (ref 0.0–0.5)
Eosinophils Relative: 3 %
HCT: 34 % — ABNORMAL LOW (ref 39.0–52.0)
Hemoglobin: 11.3 g/dL — ABNORMAL LOW (ref 13.0–17.0)
Immature Granulocytes: 0 %
Lymphocytes Relative: 23 %
Lymphs Abs: 1 10*3/uL (ref 0.7–4.0)
MCH: 28.9 pg (ref 26.0–34.0)
MCHC: 33.2 g/dL (ref 30.0–36.0)
MCV: 87 fL (ref 80.0–100.0)
Monocytes Absolute: 0.5 10*3/uL (ref 0.1–1.0)
Monocytes Relative: 11 %
Neutro Abs: 2.7 10*3/uL (ref 1.7–7.7)
Neutrophils Relative %: 62 %
Platelet Count: 123 10*3/uL — ABNORMAL LOW (ref 150–400)
RBC: 3.91 MIL/uL — ABNORMAL LOW (ref 4.22–5.81)
RDW: 15.5 % (ref 11.5–15.5)
WBC Count: 4.4 10*3/uL (ref 4.0–10.5)
nRBC: 0 % (ref 0.0–0.2)

## 2024-12-13 LAB — CMP (CANCER CENTER ONLY)
ALT: 25 U/L (ref 0–44)
AST: 31 U/L (ref 15–41)
Albumin: 4.1 g/dL (ref 3.5–5.0)
Alkaline Phosphatase: 149 U/L — ABNORMAL HIGH (ref 38–126)
Anion gap: 9 (ref 5–15)
BUN: 19 mg/dL (ref 8–23)
CO2: 24 mmol/L (ref 22–32)
Calcium: 8.8 mg/dL — ABNORMAL LOW (ref 8.9–10.3)
Chloride: 110 mmol/L (ref 98–111)
Creatinine: 1.38 mg/dL — ABNORMAL HIGH (ref 0.61–1.24)
GFR, Estimated: 52 mL/min — ABNORMAL LOW
Glucose, Bld: 114 mg/dL — ABNORMAL HIGH (ref 70–99)
Potassium: 4.7 mmol/L (ref 3.5–5.1)
Sodium: 142 mmol/L (ref 135–145)
Total Bilirubin: 0.4 mg/dL (ref 0.0–1.2)
Total Protein: 6.6 g/dL (ref 6.5–8.1)

## 2024-12-14 ENCOUNTER — Other Ambulatory Visit: Payer: Self-pay | Admitting: Internal Medicine

## 2025-01-23 ENCOUNTER — Inpatient Hospital Stay: Attending: Hematology

## 2025-01-30 ENCOUNTER — Inpatient Hospital Stay: Admitting: Hematology

## 2025-04-01 ENCOUNTER — Encounter: Admitting: Internal Medicine
